# Patient Record
Sex: Female | Born: 1945 | Race: Asian | Hispanic: No | Smoking: Never smoker
Health system: Southern US, Community
[De-identification: ages and names within clinical notes are randomized; demographics above are authoritative.]

## PROBLEM LIST (undated history)

## (undated) DIAGNOSIS — E7800 Pure hypercholesterolemia, unspecified: Secondary | ICD-10-CM

## (undated) DIAGNOSIS — Z992 Dependence on renal dialysis: Secondary | ICD-10-CM

## (undated) DIAGNOSIS — I10 Essential (primary) hypertension: Secondary | ICD-10-CM

## (undated) DIAGNOSIS — H269 Unspecified cataract: Secondary | ICD-10-CM

## (undated) DIAGNOSIS — N189 Chronic kidney disease, unspecified: Secondary | ICD-10-CM

## (undated) DIAGNOSIS — K219 Gastro-esophageal reflux disease without esophagitis: Secondary | ICD-10-CM

## (undated) DIAGNOSIS — E785 Hyperlipidemia, unspecified: Secondary | ICD-10-CM

## (undated) DIAGNOSIS — K2990 Gastroduodenitis, unspecified, without bleeding: Secondary | ICD-10-CM

## (undated) DIAGNOSIS — K2970 Gastritis, unspecified, without bleeding: Secondary | ICD-10-CM

## (undated) DIAGNOSIS — R51 Headache: Secondary | ICD-10-CM

## (undated) DIAGNOSIS — K635 Polyp of colon: Secondary | ICD-10-CM

## (undated) DIAGNOSIS — Z86010 Personal history of colonic polyps: Principal | ICD-10-CM

## (undated) DIAGNOSIS — Z5189 Encounter for other specified aftercare: Secondary | ICD-10-CM

## (undated) DIAGNOSIS — N186 End stage renal disease: Secondary | ICD-10-CM

## (undated) DIAGNOSIS — D649 Anemia, unspecified: Secondary | ICD-10-CM

## (undated) DIAGNOSIS — M1990 Unspecified osteoarthritis, unspecified site: Secondary | ICD-10-CM

## (undated) HISTORY — DX: Chronic kidney disease, unspecified: N18.9

## (undated) HISTORY — DX: Unspecified cataract: H26.9

## (undated) HISTORY — PX: AV FISTULA REPAIR: SHX563

## (undated) HISTORY — DX: Gastritis, unspecified, without bleeding: K29.70

## (undated) HISTORY — DX: Personal history of colonic polyps: Z86.010

## (undated) HISTORY — DX: Essential (primary) hypertension: I10

## (undated) HISTORY — DX: Polyp of colon: K63.5

## (undated) HISTORY — DX: Anemia, unspecified: D64.9

## (undated) HISTORY — DX: Encounter for other specified aftercare: Z51.89

## (undated) HISTORY — DX: Gastroduodenitis, unspecified, without bleeding: K29.90

## (undated) HISTORY — DX: Hyperlipidemia, unspecified: E78.5

---

## 1978-05-22 HISTORY — PX: TUBAL LIGATION: SHX77

## 1997-11-18 ENCOUNTER — Other Ambulatory Visit: Admission: RE | Admit: 1997-11-18 | Discharge: 1997-11-18 | Payer: Self-pay | Admitting: Nephrology

## 1998-11-14 ENCOUNTER — Emergency Department (HOSPITAL_COMMUNITY): Admission: EM | Admit: 1998-11-14 | Discharge: 1998-11-14 | Payer: Self-pay | Admitting: Emergency Medicine

## 1998-11-14 ENCOUNTER — Encounter: Payer: Self-pay | Admitting: Emergency Medicine

## 2003-01-29 ENCOUNTER — Other Ambulatory Visit: Admission: RE | Admit: 2003-01-29 | Discharge: 2003-01-29 | Payer: Self-pay | Admitting: Nephrology

## 2003-01-29 ENCOUNTER — Other Ambulatory Visit: Admission: RE | Admit: 2003-01-29 | Discharge: 2003-01-29 | Payer: Self-pay | Admitting: Cardiology

## 2006-03-15 ENCOUNTER — Other Ambulatory Visit: Admission: RE | Admit: 2006-03-15 | Discharge: 2006-03-15 | Payer: Self-pay | Admitting: Nephrology

## 2007-11-07 ENCOUNTER — Emergency Department (HOSPITAL_COMMUNITY): Admission: EM | Admit: 2007-11-07 | Discharge: 2007-11-07 | Payer: Self-pay | Admitting: Family Medicine

## 2007-12-12 ENCOUNTER — Emergency Department (HOSPITAL_COMMUNITY): Admission: EM | Admit: 2007-12-12 | Discharge: 2007-12-12 | Payer: Self-pay | Admitting: Family Medicine

## 2008-01-10 ENCOUNTER — Other Ambulatory Visit: Admission: RE | Admit: 2008-01-10 | Discharge: 2008-01-10 | Payer: Self-pay | Admitting: Nephrology

## 2009-11-10 ENCOUNTER — Emergency Department (HOSPITAL_COMMUNITY): Admission: EM | Admit: 2009-11-10 | Discharge: 2009-11-10 | Payer: Self-pay | Admitting: Family Medicine

## 2010-03-13 ENCOUNTER — Emergency Department (HOSPITAL_COMMUNITY)
Admission: EM | Admit: 2010-03-13 | Discharge: 2010-03-13 | Payer: Self-pay | Source: Home / Self Care | Admitting: Family Medicine

## 2010-06-12 ENCOUNTER — Encounter: Payer: Self-pay | Admitting: Nephrology

## 2011-02-16 LAB — CBC
HCT: 30.3 — ABNORMAL LOW
Hemoglobin: 10 — ABNORMAL LOW
MCHC: 33
MCV: 82.9
Platelets: 196
RBC: 3.65 — ABNORMAL LOW
RDW: 13.5
WBC: 10.2

## 2011-02-16 LAB — RAPID URINE DRUG SCREEN, HOSP PERFORMED
Amphetamines: NOT DETECTED
Barbiturates: NOT DETECTED
Benzodiazepines: NOT DETECTED
Cocaine: NOT DETECTED
Opiates: NOT DETECTED
Tetrahydrocannabinol: NOT DETECTED

## 2011-02-16 LAB — DIFFERENTIAL
Basophils Absolute: 0.1
Basophils Relative: 1
Eosinophils Absolute: 0.2
Eosinophils Relative: 2
Lymphocytes Relative: 14
Lymphs Abs: 1.4
Monocytes Absolute: 0.5
Monocytes Relative: 5
Neutro Abs: 8.1 — ABNORMAL HIGH
Neutrophils Relative %: 79 — ABNORMAL HIGH

## 2011-02-16 LAB — URINALYSIS, ROUTINE W REFLEX MICROSCOPIC
Bilirubin Urine: NEGATIVE
Glucose, UA: NEGATIVE
Hgb urine dipstick: NEGATIVE
Ketones, ur: NEGATIVE
Nitrite: NEGATIVE
Protein, ur: 100 — AB
Specific Gravity, Urine: 1.007
Urobilinogen, UA: 0.2
pH: 5.5

## 2011-02-16 LAB — POCT I-STAT, CHEM 8
BUN: 53 — ABNORMAL HIGH
Calcium, Ion: 1.18
Chloride: 109
Creatinine, Ser: 2.4 — ABNORMAL HIGH
Glucose, Bld: 119 — ABNORMAL HIGH
HCT: 32 — ABNORMAL LOW
Hemoglobin: 10.9 — ABNORMAL LOW
Potassium: 4.9
Sodium: 139
TCO2: 21

## 2011-02-16 LAB — URINE MICROSCOPIC-ADD ON

## 2011-02-17 LAB — DIFFERENTIAL
Basophils Absolute: 0.1
Basophils Relative: 1
Eosinophils Absolute: 0.2
Eosinophils Relative: 2
Lymphocytes Relative: 23
Lymphs Abs: 2
Monocytes Absolute: 0.5
Monocytes Relative: 6
Neutro Abs: 6.1
Neutrophils Relative %: 68

## 2011-02-17 LAB — COMPREHENSIVE METABOLIC PANEL
ALT: 17
AST: 26
Albumin: 3.8
Alkaline Phosphatase: 82
BUN: 59 — ABNORMAL HIGH
CO2: 23
Calcium: 10.2
Chloride: 110
Creatinine, Ser: 2.4 — ABNORMAL HIGH
GFR calc Af Amer: 25 — ABNORMAL LOW
GFR calc non Af Amer: 20 — ABNORMAL LOW
Glucose, Bld: 98
Potassium: 4.4
Sodium: 138
Total Bilirubin: 0.6
Total Protein: 8

## 2011-02-17 LAB — CBC
HCT: 31.1 — ABNORMAL LOW
Hemoglobin: 10.3 — ABNORMAL LOW
MCHC: 33.2
MCV: 82.9
Platelets: 185
RBC: 3.75 — ABNORMAL LOW
RDW: 13.8
WBC: 8.9

## 2011-02-17 LAB — POCT CARDIAC MARKERS
CKMB, poc: 2.4
CKMB, poc: 2.5
Myoglobin, poc: 249
Myoglobin, poc: 269
Operator id: 294341
Operator id: 294341
Troponin i, poc: <0.05
Troponin i, poc: <0.05

## 2011-02-17 LAB — LIPASE, BLOOD: Lipase: 45

## 2011-02-17 LAB — AMYLASE: Amylase: 296 — ABNORMAL HIGH

## 2011-08-11 ENCOUNTER — Other Ambulatory Visit: Payer: Self-pay

## 2011-08-11 DIAGNOSIS — Z01810 Encounter for preprocedural cardiovascular examination: Secondary | ICD-10-CM

## 2011-08-11 DIAGNOSIS — N184 Chronic kidney disease, stage 4 (severe): Secondary | ICD-10-CM

## 2011-09-05 ENCOUNTER — Encounter: Payer: Self-pay | Admitting: Surgery

## 2011-09-15 ENCOUNTER — Encounter: Payer: Self-pay | Admitting: Surgery

## 2011-09-18 ENCOUNTER — Ambulatory Visit: Payer: Self-pay | Admitting: Surgery

## 2011-10-09 ENCOUNTER — Other Ambulatory Visit (HOSPITAL_COMMUNITY): Payer: Self-pay | Admitting: *Deleted

## 2011-10-12 ENCOUNTER — Encounter (HOSPITAL_COMMUNITY): Payer: Self-pay

## 2011-10-20 ENCOUNTER — Ambulatory Visit (HOSPITAL_COMMUNITY)
Admission: RE | Admit: 2011-10-20 | Discharge: 2011-10-20 | Disposition: A | Discharge status: Home / Self Care | Payer: PRIVATE HEALTH INSURANCE | Source: Ambulatory Visit | Attending: Nephrology | Admitting: Nephrology

## 2011-10-20 DIAGNOSIS — D649 Anemia, unspecified: Secondary | ICD-10-CM | POA: Insufficient documentation

## 2011-10-20 LAB — POCT HEMOGLOBIN-HEMACUE: Hemoglobin: 9.7 g/dL — ABNORMAL LOW (ref 12.0–15.0)

## 2011-10-20 MED ORDER — EPOETIN ALFA 20000 UNIT/ML IJ SOLN
INTRAMUSCULAR | Status: AC
  Filled 2011-10-20: qty 1

## 2011-10-20 MED ORDER — EPOETIN ALFA 20000 UNIT/ML IJ SOLN
20000.0000 [IU] | INTRAMUSCULAR | Status: DC
  Administered 2011-10-20: 20000 [IU] via SUBCUTANEOUS

## 2011-11-03 ENCOUNTER — Encounter (HOSPITAL_COMMUNITY)
Admission: RE | Admit: 2011-11-03 | Discharge: 2011-11-03 | Disposition: A | Discharge status: Home / Self Care | Payer: PRIVATE HEALTH INSURANCE | Source: Ambulatory Visit | Attending: Nephrology | Admitting: Nephrology

## 2011-11-03 DIAGNOSIS — N185 Chronic kidney disease, stage 5: Secondary | ICD-10-CM | POA: Insufficient documentation

## 2011-11-03 DIAGNOSIS — D649 Anemia, unspecified: Secondary | ICD-10-CM | POA: Insufficient documentation

## 2011-11-03 LAB — POCT HEMOGLOBIN-HEMACUE: Hemoglobin: 10.4 g/dL — ABNORMAL LOW (ref 12.0–15.0)

## 2011-11-03 MED ORDER — EPOETIN ALFA 20000 UNIT/ML IJ SOLN
INTRAMUSCULAR | Status: AC
  Filled 2011-11-03: qty 1

## 2011-11-03 MED ORDER — EPOETIN ALFA 20000 UNIT/ML IJ SOLN
20000.0000 [IU] | INTRAMUSCULAR | Status: DC
  Administered 2011-11-03: 20000 [IU] via SUBCUTANEOUS

## 2011-11-17 ENCOUNTER — Encounter (HOSPITAL_COMMUNITY)
Admission: RE | Admit: 2011-11-17 | Discharge: 2011-11-17 | Disposition: A | Discharge status: Home / Self Care | Payer: PRIVATE HEALTH INSURANCE | Source: Ambulatory Visit | Attending: Nephrology | Admitting: Nephrology

## 2011-11-17 LAB — POCT HEMOGLOBIN-HEMACUE: Hemoglobin: 10.1 g/dL — ABNORMAL LOW (ref 12.0–15.0)

## 2011-11-17 MED ORDER — EPOETIN ALFA 20000 UNIT/ML IJ SOLN
20000.0000 [IU] | INTRAMUSCULAR | Status: DC
  Administered 2011-11-17: 20000 [IU] via SUBCUTANEOUS

## 2011-11-17 MED ORDER — EPOETIN ALFA 20000 UNIT/ML IJ SOLN
INTRAMUSCULAR | Status: AC
  Administered 2011-11-17: 20000 [IU] via SUBCUTANEOUS
  Filled 2011-11-17: qty 1

## 2011-12-01 ENCOUNTER — Encounter (HOSPITAL_COMMUNITY)
Admission: RE | Admit: 2011-12-01 | Discharge: 2011-12-01 | Disposition: A | Discharge status: Home / Self Care | Payer: PRIVATE HEALTH INSURANCE | Source: Ambulatory Visit | Attending: Nephrology | Admitting: Nephrology

## 2011-12-01 DIAGNOSIS — D649 Anemia, unspecified: Secondary | ICD-10-CM | POA: Insufficient documentation

## 2011-12-01 DIAGNOSIS — N185 Chronic kidney disease, stage 5: Secondary | ICD-10-CM | POA: Insufficient documentation

## 2011-12-01 LAB — POCT HEMOGLOBIN-HEMACUE: Hemoglobin: 10 g/dL — ABNORMAL LOW (ref 12.0–15.0)

## 2011-12-01 MED ORDER — EPOETIN ALFA 20000 UNIT/ML IJ SOLN
20000.0000 [IU] | INTRAMUSCULAR | Status: DC
  Administered 2011-12-01: 20000 [IU] via SUBCUTANEOUS
  Filled 2011-12-01: qty 1

## 2011-12-09 ENCOUNTER — Other Ambulatory Visit: Payer: Self-pay | Admitting: Nephrology

## 2011-12-14 ENCOUNTER — Encounter (HOSPITAL_COMMUNITY)
Admission: RE | Admit: 2011-12-14 | Discharge: 2011-12-14 | Disposition: A | Discharge status: Home / Self Care | Payer: PRIVATE HEALTH INSURANCE | Source: Ambulatory Visit | Attending: Nephrology | Admitting: Nephrology

## 2011-12-14 LAB — POCT HEMOGLOBIN-HEMACUE: Hemoglobin: 10.7 g/dL — ABNORMAL LOW (ref 12.0–15.0)

## 2011-12-14 LAB — RENAL FUNCTION PANEL
Albumin: 3 g/dL — ABNORMAL LOW (ref 3.5–5.2)
BUN: 112 mg/dL — ABNORMAL HIGH (ref 6–23)
CO2: 16 mEq/L — ABNORMAL LOW (ref 19–32)
Calcium: 8.6 mg/dL (ref 8.4–10.5)
Chloride: 100 mEq/L (ref 96–112)
Creatinine, Ser: 10.09 mg/dL — ABNORMAL HIGH (ref 0.50–1.10)
GFR calc Af Amer: 4 mL/min — ABNORMAL LOW (ref >90)
GFR calc non Af Amer: 3 mL/min — ABNORMAL LOW (ref >90)
Glucose, Bld: 80 mg/dL (ref 70–99)
Phosphorus: 8.9 mg/dL — ABNORMAL HIGH (ref 2.3–4.6)
Potassium: 6.5 mEq/L (ref 3.5–5.1)
Sodium: 131 mEq/L — ABNORMAL LOW (ref 135–145)

## 2011-12-14 LAB — IRON AND TIBC
Iron: 74 ug/dL (ref 42–135)
Saturation Ratios: 28 % (ref 20–55)
TIBC: 266 ug/dL (ref 250–470)
UIBC: 192 ug/dL (ref 125–400)

## 2011-12-14 LAB — FERRITIN: Ferritin: 38 ng/mL (ref 10–291)

## 2011-12-14 MED ORDER — EPOETIN ALFA 20000 UNIT/ML IJ SOLN
INTRAMUSCULAR | Status: AC
  Filled 2011-12-14: qty 1

## 2011-12-14 MED ORDER — EPOETIN ALFA 20000 UNIT/ML IJ SOLN
20000.0000 [IU] | INTRAMUSCULAR | Status: DC
  Administered 2011-12-14: 20000 [IU] via SUBCUTANEOUS

## 2011-12-15 LAB — PTH, INTACT AND CALCIUM
Calcium, Total (PTH): 8.3 mg/dL — ABNORMAL LOW (ref 8.4–10.5)
PTH: 718.7 pg/mL — ABNORMAL HIGH (ref 14.0–72.0)

## 2011-12-28 ENCOUNTER — Encounter (HOSPITAL_COMMUNITY)
Admission: RE | Admit: 2011-12-28 | Discharge: 2011-12-28 | Disposition: A | Discharge status: Home / Self Care | Payer: PRIVATE HEALTH INSURANCE | Source: Ambulatory Visit | Attending: Nephrology | Admitting: Nephrology

## 2011-12-28 DIAGNOSIS — D649 Anemia, unspecified: Secondary | ICD-10-CM | POA: Insufficient documentation

## 2011-12-28 DIAGNOSIS — N185 Chronic kidney disease, stage 5: Secondary | ICD-10-CM | POA: Insufficient documentation

## 2011-12-28 LAB — POCT HEMOGLOBIN-HEMACUE: Hemoglobin: 10.5 g/dL — ABNORMAL LOW (ref 12.0–15.0)

## 2011-12-28 MED ORDER — EPOETIN ALFA 20000 UNIT/ML IJ SOLN
20000.0000 [IU] | INTRAMUSCULAR | Status: DC
  Administered 2011-12-28: 20000 [IU] via SUBCUTANEOUS
  Filled 2011-12-28: qty 1

## 2012-01-07 ENCOUNTER — Other Ambulatory Visit: Payer: Self-pay | Admitting: Nephrology

## 2012-01-11 ENCOUNTER — Encounter (HOSPITAL_COMMUNITY)
Admission: RE | Admit: 2012-01-11 | Discharge: 2012-01-11 | Disposition: A | Discharge status: Home / Self Care | Payer: PRIVATE HEALTH INSURANCE | Source: Ambulatory Visit | Attending: Nephrology | Admitting: Nephrology

## 2012-01-11 LAB — POCT HEMOGLOBIN-HEMACUE: Hemoglobin: 11.1 g/dL — ABNORMAL LOW (ref 12.0–15.0)

## 2012-01-11 MED ORDER — EPOETIN ALFA 20000 UNIT/ML IJ SOLN
20000.0000 [IU] | INTRAMUSCULAR | Status: DC
  Administered 2012-01-11: 20000 [IU] via SUBCUTANEOUS
  Filled 2012-01-11: qty 1

## 2012-01-19 ENCOUNTER — Other Ambulatory Visit: Payer: Self-pay

## 2012-01-19 DIAGNOSIS — Z01810 Encounter for preprocedural cardiovascular examination: Secondary | ICD-10-CM

## 2012-01-19 DIAGNOSIS — N185 Chronic kidney disease, stage 5: Secondary | ICD-10-CM

## 2012-01-23 ENCOUNTER — Encounter: Payer: Self-pay | Admitting: Vascular Surgery

## 2012-01-24 ENCOUNTER — Other Ambulatory Visit (HOSPITAL_COMMUNITY): Payer: Self-pay

## 2012-01-24 ENCOUNTER — Encounter: Payer: Self-pay | Admitting: Vascular Surgery

## 2012-01-24 ENCOUNTER — Encounter (INDEPENDENT_AMBULATORY_CARE_PROVIDER_SITE_OTHER): Payer: PRIVATE HEALTH INSURANCE | Admitting: *Deleted

## 2012-01-24 ENCOUNTER — Ambulatory Visit (INDEPENDENT_AMBULATORY_CARE_PROVIDER_SITE_OTHER): Payer: PRIVATE HEALTH INSURANCE | Admitting: Vascular Surgery

## 2012-01-24 VITALS — BP 191/86 | HR 58 | Resp 16 | Ht 62.0 in | Wt 173.0 lb

## 2012-01-24 DIAGNOSIS — N185 Chronic kidney disease, stage 5: Secondary | ICD-10-CM

## 2012-01-24 DIAGNOSIS — N186 End stage renal disease: Secondary | ICD-10-CM | POA: Insufficient documentation

## 2012-01-24 DIAGNOSIS — Z992 Dependence on renal dialysis: Secondary | ICD-10-CM | POA: Insufficient documentation

## 2012-01-24 DIAGNOSIS — Z01810 Encounter for preprocedural cardiovascular examination: Secondary | ICD-10-CM

## 2012-01-24 NOTE — Progress Notes (Signed)
Vascular and Vein Specialist of [facility]  Patient name: Alexa Dougherty MRN: [id number] DOB: 03-12-1946 Sex: female  REASON FOR CONSULT: Evaluate for hemodialysis access. She was referred by Dr. Erling Cruz.  HPI: Alexa Dougherty is a 66 y.o. female who is not yet on dialysis. We were asked to place access and also placing a catheter. 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 She is right-handed. She denies any recent uremic symptoms. Specifically she denies nausea, vomiting, fatigue, anorexia, or palpitations.  I have reviewed her records from Dr. Ollen Gross Powell's office. She also has anemia and has been receiving Procrit. In addition she has uncontrolled hypertension. She has secondary hyperparathyroidism. Her last creatinine was 11.87.  Past Medical History  Diagnosis Date  . Anemia   . Hypertension   . Chronic kidney disease     Family History  Problem Relation Age of Onset  . Hypertension Mother   . Cancer Sister   . Cancer Brother   . Hypertension Daughter     SOCIAL HISTORY: History  Substance Use Topics  . Smoking status: Never Smoker   . Smokeless tobacco: Never Used  . Alcohol Use: No    No Known Allergies  Current Outpatient Prescriptions  Medication Sig Dispense Refill  . atorvastatin (LIPITOR) 10 MG tablet Take 10 mg by mouth daily.      Marland Kitchen dexlansoprazole (DEXILANT) 60 MG capsule Take 60 mg by mouth daily.      . diazepam (VALIUM) 2 MG tablet Take 2 mg by mouth as needed.      . doxazosin (CARDURA) 1 MG tablet Take 1 mg by mouth daily. Take 1 tab at bedtime for 2 weeks starting 01/17/2012, then take 2 q day after      . epoetin alfa (EPOGEN,PROCRIT) 16109 UNIT/ML injection Inject 20,000 Units into the skin 2 (two) times a week.      . iron polysaccharides (NIFEREX) 150 MG capsule Take 150 mg by mouth daily.      . metoprolol succinate (TOPROL-XL) 50 MG 24 hr tablet Take 50 mg by mouth daily.      Marland Kitchen RENVELA 800 MG  tablet Take 800 mg by mouth daily.      Marland Kitchen amLODipine (NORVASC) 5 MG tablet Take 5 mg by mouth daily.      . calcium-vitamin D (OSCAL WITH D) 500-200 MG-UNIT per tablet Take 1 tablet by mouth 2 (two) times daily.      Marland Kitchen lisinopril-hydrochlorothiazide (PRINZIDE,ZESTORETIC) 10-12.5 MG per tablet Take 1 tablet by mouth daily.        REVIEW OF SYSTEMS: Valu.Nieves ] denotes positive finding; [  ] denotes negative finding  CARDIOVASCULAR:  [ ]  chest pain   [ ]  chest pressure   [ ]  palpitations   [ ]  orthopnea   [ ]  dyspnea on exertion   [ ]  claudication   [ ]  rest pain   [ ]  DVT   [ ]  phlebitis PULMONARY:   [ ]  productive cough   [ ]  asthma   [ ]  wheezing NEUROLOGIC:   [ ]  weakness  [ ]  paresthesias  [ ]  aphasia  [ ]  amaurosis  [ ]  dizziness HEMATOLOGIC:   [ ]  bleeding problems   [ ]  clotting disorders MUSCULOSKELETAL:  [ ]  joint pain   [ ]  joint swelling [ ]  leg swelling GASTROINTESTINAL: [ ]   blood in stool  [ ]   hematemesis GENITOURINARY:  [ ]   dysuria  [ ]   hematuria PSYCHIATRIC:  [ ]   history of major depression INTEGUMENTARY:  [ ]  rashes  [ ]  ulcers CONSTITUTIONAL:  [ ]  fever   [ ]  chills  PHYSICAL EXAM: Filed Vitals:   01/24/12 1555  BP: 191/86  Pulse: 58  Resp: 16  Height: 5\' 2"  (1.575 m)  Weight: 173 lb (78.472 kg)  SpO2: 100%   Body mass index is 31.64 kg/(m^2). GENERAL: The patient is a well-nourished female, in no acute distress. The vital signs are documented above. CARDIOVASCULAR: There is a regular rate and rhythm. I do not detect carotid bruits. She has palpable brachial and radial pulses bilaterally. PULMONARY: There is good air exchange bilaterally without wheezing or rales. ABDOMEN: Soft and non-tender with normal pitched bowel sounds.  MUSCULOSKELETAL: There are no major deformities or cyanosis. NEUROLOGIC: No focal weakness or paresthesias are detected. SKIN: There are no ulcers or rashes noted. PSYCHIATRIC: The patient has a normal affect.  DATA:  I have independently  interpreted her vein map which shows a reasonable forearm and upper arm cephalic vein on the left. She also has a reasonable basilic vein in the upper arm on the left. The veins on the right are somewhat smaller but also looked reasonable.  MEDICAL ISSUES:  End stage renal disease Patient is scheduled for placement of a dietetic catheter and a left AV fistula on 02/02/2012. I have discussed the indications for placement of the catheter and the potential complications including but not limited to bleeding, arterial injury, or pneumothorax. We have also discussed the risk of catheter infection.I have also explained the indications for placement of an AV fistula or AV graft. I've explained that if at all possible we will place an AV fistula.  I have reviewed the risks of placement of an AV fistula including but not limited to: failure of the fistula to mature, need for subsequent interventions, and thrombosis. In addition I have reviewed the potential complications of placement of an AV graft. These risks include, but are not limited to, graft thrombosis, graft infection, wound healing problems, bleeding, arm swelling, and steal syndrome. All the patient's questions were answered and they are agreeable to proceed with surgery.    [place] [facility] [place] Beeper: [phone]

## 2012-01-24 NOTE — Assessment & Plan Note (Signed)
Patient is scheduled for placement of a dietetic catheter and a left AV fistula on 02/02/2012. I have discussed the indications for placement of the catheter and the potential complications including but not limited to bleeding, arterial injury, or pneumothorax. We have also discussed the risk of catheter infection.I have also explained the indications for placement of an AV fistula or AV graft. I've explained that if at all possible we will place an AV fistula.  I have reviewed the risks of placement of an AV fistula including but not limited to: failure of the fistula to mature, need for subsequent interventions, and thrombosis. In addition I have reviewed the potential complications of placement of an AV graft. These risks include, but are not limited to, graft thrombosis, graft infection, wound healing problems, bleeding, arm swelling, and steal syndrome. All the patient's questions were answered and they are agreeable to proceed with surgery.

## 2012-01-25 ENCOUNTER — Encounter (HOSPITAL_COMMUNITY)
Admission: RE | Admit: 2012-01-25 | Discharge: 2012-01-25 | Disposition: A | Discharge status: Home / Self Care | Payer: PRIVATE HEALTH INSURANCE | Source: Ambulatory Visit | Attending: Nephrology | Admitting: Nephrology

## 2012-01-25 DIAGNOSIS — N185 Chronic kidney disease, stage 5: Secondary | ICD-10-CM | POA: Insufficient documentation

## 2012-01-25 DIAGNOSIS — D649 Anemia, unspecified: Secondary | ICD-10-CM | POA: Insufficient documentation

## 2012-01-25 LAB — POCT HEMOGLOBIN-HEMACUE: Hemoglobin: 10.5 g/dL — ABNORMAL LOW (ref 12.0–15.0)

## 2012-01-25 MED ORDER — EPOETIN ALFA 20000 UNIT/ML IJ SOLN
INTRAMUSCULAR | Status: AC
  Filled 2012-01-25: qty 1

## 2012-01-25 MED ORDER — EPOETIN ALFA 20000 UNIT/ML IJ SOLN
20000.0000 [IU] | INTRAMUSCULAR | Status: DC
  Administered 2012-01-25: 20000 [IU] via SUBCUTANEOUS

## 2012-01-26 ENCOUNTER — Other Ambulatory Visit: Payer: Self-pay

## 2012-01-30 ENCOUNTER — Encounter (HOSPITAL_COMMUNITY): Payer: Self-pay | Admitting: Pharmacy Technician

## 2012-01-31 ENCOUNTER — Other Ambulatory Visit (HOSPITAL_COMMUNITY): Payer: PRIVATE HEALTH INSURANCE

## 2012-02-01 ENCOUNTER — Encounter (HOSPITAL_COMMUNITY): Payer: Self-pay

## 2012-02-01 ENCOUNTER — Encounter (HOSPITAL_COMMUNITY)
Admission: RE | Admit: 2012-02-01 | Discharge: 2012-02-01 | Disposition: A | Discharge status: Home / Self Care | Payer: PRIVATE HEALTH INSURANCE | Source: Ambulatory Visit | Attending: Vascular Surgery | Admitting: Vascular Surgery

## 2012-02-01 HISTORY — DX: Headache: R51

## 2012-02-01 HISTORY — DX: Unspecified osteoarthritis, unspecified site: M19.90

## 2012-02-01 HISTORY — DX: Gastro-esophageal reflux disease without esophagitis: K21.9

## 2012-02-01 LAB — SURGICAL PCR SCREEN
MRSA, PCR: NEGATIVE
Staphylococcus aureus: NEGATIVE

## 2012-02-01 LAB — CBC
HCT: 31 % — ABNORMAL LOW (ref 36.0–46.0)
Hemoglobin: 9.4 g/dL — ABNORMAL LOW (ref 12.0–15.0)
MCH: 24.6 pg — ABNORMAL LOW (ref 26.0–34.0)
MCHC: 30.3 g/dL (ref 30.0–36.0)
MCV: 81.2 fL (ref 78.0–100.0)
Platelets: 217 10*3/uL (ref 150–400)
RBC: 3.82 MIL/uL — ABNORMAL LOW (ref 3.87–5.11)
RDW: 16.7 % — ABNORMAL HIGH (ref 11.5–15.5)
WBC: 5.3 10*3/uL (ref 4.0–10.5)

## 2012-02-01 MED ORDER — CEFAZOLIN SODIUM-DEXTROSE 2-3 GM-% IV SOLR
2.0000 g | INTRAVENOUS | Status: AC
  Administered 2012-02-02: 2 g via INTRAVENOUS
  Filled 2012-02-01: qty 50

## 2012-02-01 NOTE — Pre-Procedure Instructions (Signed)
[place]  02/01/2012   Your procedure is scheduled on: Friday  02/02/12  Report to [facility] at 730 AM.  Call this number if you have problems the morning of surgery: [phone]   Remember:   Do not eat food OR DRINK :After Midnight.   Take these medicines the morning of surgery with A SIP OF WATER:  DEXILANT, VALIUM, CARDURA, TOPROL (METOPROLOL   Do not wear jewelry, make-up or nail polish.  Do not wear lotions, powders, or perfumes. You may wear deodorant.  Do not shave 48 hours prior to surgery. Men may shave face and neck.  Do not bring valuables to the hospital.  Contacts, dentures or bridgework may not be worn into surgery.  Leave suitcase in the car. After surgery it may be brought to your room.  For patients admitted to the hospital, checkout time is 11:00 AM the day of discharge.   Patients discharged the day of surgery will not be allowed to drive home.  Name and phone number of your driver:   Special Instructions: CHG Shower Use Special Wash: 1/2 bottle night before surgery and 1/2 bottle morning of surgery.   Please read over the following fact sheets that you were given: Pain Booklet, Coughing and Deep Breathing, MRSA Information and Surgical Site Infection Prevention

## 2012-02-01 NOTE — Progress Notes (Signed)
SPOKE TO JUDY AT DR DICKSON'S OFFICE RE; AREA ON PATIENT'S ABDOMEN THAT LOOKS RED AND RAISED (LOOKS LIKE A BITE/BOIL).  JUDY STATED PATIENT NEEDED TO SEE DR SHELTON TODAY AND DO CBC TO CHECK WBC.  PATIENT WILL SEE DR SHELTON'S PA TODAY WHEN SHE LEAVES PAT.

## 2012-02-02 ENCOUNTER — Ambulatory Visit (HOSPITAL_COMMUNITY): Payer: PRIVATE HEALTH INSURANCE

## 2012-02-02 ENCOUNTER — Ambulatory Visit (HOSPITAL_COMMUNITY)
Admission: RE | Admit: 2012-02-02 | Discharge: 2012-02-02 | Disposition: A | Discharge status: Home / Self Care | Payer: PRIVATE HEALTH INSURANCE | Source: Ambulatory Visit | Attending: Vascular Surgery | Admitting: Vascular Surgery

## 2012-02-02 ENCOUNTER — Ambulatory Visit (HOSPITAL_COMMUNITY): Payer: PRIVATE HEALTH INSURANCE | Admitting: Anesthesiology

## 2012-02-02 ENCOUNTER — Encounter (HOSPITAL_COMMUNITY): Payer: Self-pay | Admitting: Anesthesiology

## 2012-02-02 ENCOUNTER — Encounter (HOSPITAL_COMMUNITY): Payer: Self-pay | Admitting: *Deleted

## 2012-02-02 ENCOUNTER — Other Ambulatory Visit: Payer: Self-pay | Admitting: *Deleted

## 2012-02-02 ENCOUNTER — Encounter (HOSPITAL_COMMUNITY)
Admission: RE | Disposition: A | Discharge status: Home / Self Care | Payer: Self-pay | Source: Ambulatory Visit | Attending: Vascular Surgery

## 2012-02-02 ENCOUNTER — Telehealth: Payer: Self-pay | Admitting: Vascular Surgery

## 2012-02-02 DIAGNOSIS — K219 Gastro-esophageal reflux disease without esophagitis: Secondary | ICD-10-CM | POA: Insufficient documentation

## 2012-02-02 DIAGNOSIS — N185 Chronic kidney disease, stage 5: Secondary | ICD-10-CM | POA: Insufficient documentation

## 2012-02-02 DIAGNOSIS — R51 Headache: Secondary | ICD-10-CM | POA: Insufficient documentation

## 2012-02-02 DIAGNOSIS — Z01812 Encounter for preprocedural laboratory examination: Secondary | ICD-10-CM | POA: Insufficient documentation

## 2012-02-02 DIAGNOSIS — N186 End stage renal disease: Secondary | ICD-10-CM

## 2012-02-02 DIAGNOSIS — Z4931 Encounter for adequacy testing for hemodialysis: Secondary | ICD-10-CM

## 2012-02-02 DIAGNOSIS — Z01818 Encounter for other preprocedural examination: Secondary | ICD-10-CM | POA: Insufficient documentation

## 2012-02-02 DIAGNOSIS — I120 Hypertensive chronic kidney disease with stage 5 chronic kidney disease or end stage renal disease: Secondary | ICD-10-CM | POA: Insufficient documentation

## 2012-02-02 DIAGNOSIS — Z01810 Encounter for preprocedural cardiovascular examination: Secondary | ICD-10-CM | POA: Insufficient documentation

## 2012-02-02 HISTORY — PX: INSERTION OF DIALYSIS CATHETER: SHX1324

## 2012-02-02 HISTORY — PX: AV FISTULA PLACEMENT: SHX1204

## 2012-02-02 LAB — POCT I-STAT 4, (NA,K, GLUC, HGB,HCT)
Glucose, Bld: 95 mg/dL (ref 70–99)
HCT: 32 % — ABNORMAL LOW (ref 36.0–46.0)
Hemoglobin: 10.9 g/dL — ABNORMAL LOW (ref 12.0–15.0)
Potassium: 4.7 mEq/L (ref 3.5–5.1)
Sodium: 140 mEq/L (ref 135–145)

## 2012-02-02 SURGERY — ARTERIOVENOUS (AV) FISTULA CREATION
Anesthesia: General | Site: Neck | Laterality: Right | Wound class: Clean

## 2012-02-02 MED ORDER — LIDOCAINE-EPINEPHRINE (PF) 1 %-1:200000 IJ SOLN
INTRAMUSCULAR | Status: AC
  Filled 2012-02-02: qty 10

## 2012-02-02 MED ORDER — 0.9 % SODIUM CHLORIDE (POUR BTL) OPTIME
TOPICAL | Status: DC | PRN
  Administered 2012-02-02: 1000 mL

## 2012-02-02 MED ORDER — FENTANYL CITRATE 0.05 MG/ML IJ SOLN
INTRAMUSCULAR | Status: DC | PRN
  Administered 2012-02-02: 75 ug via INTRAVENOUS
  Administered 2012-02-02: 25 ug via INTRAVENOUS
  Administered 2012-02-02: 50 ug via INTRAVENOUS
  Administered 2012-02-02: 25 ug via INTRAVENOUS

## 2012-02-02 MED ORDER — SODIUM CHLORIDE 0.9 % IV SOLN: INTRAVENOUS | Status: DC

## 2012-02-02 MED ORDER — METOPROLOL SUCCINATE ER 50 MG PO TB24
50.0000 mg | ORAL_TABLET | Freq: Once | ORAL | Status: AC
  Administered 2012-02-02: 50 mg via ORAL
  Filled 2012-02-02: qty 1

## 2012-02-02 MED ORDER — HYDROMORPHONE HCL PF 1 MG/ML IJ SOLN
INTRAMUSCULAR | Status: AC
  Administered 2012-02-02: 0.25 mg via INTRAVENOUS
  Filled 2012-02-02: qty 1

## 2012-02-02 MED ORDER — PHENYLEPHRINE HCL 10 MG/ML IJ SOLN
INTRAMUSCULAR | Status: DC | PRN
  Administered 2012-02-02: 120 ug via INTRAVENOUS
  Administered 2012-02-02 (×2): 80 ug via INTRAVENOUS
  Administered 2012-02-02: 320 ug via INTRAVENOUS

## 2012-02-02 MED ORDER — OXYCODONE-ACETAMINOPHEN 5-325 MG PO TABS: 1.0000 | ORAL_TABLET | ORAL | Status: AC | PRN

## 2012-02-02 MED ORDER — PROPOFOL 10 MG/ML IV BOLUS
INTRAVENOUS | Status: DC | PRN
  Administered 2012-02-02: 160 mg via INTRAVENOUS

## 2012-02-02 MED ORDER — THROMBIN 20000 UNITS EX SOLR
CUTANEOUS | Status: AC
  Filled 2012-02-02: qty 20000

## 2012-02-02 MED ORDER — ONDANSETRON HCL 4 MG/2ML IJ SOLN
INTRAMUSCULAR | Status: DC | PRN
  Administered 2012-02-02: 4 mg via INTRAVENOUS

## 2012-02-02 MED ORDER — HYDROMORPHONE HCL PF 1 MG/ML IJ SOLN
0.2500 mg | INTRAMUSCULAR | Status: DC | PRN
  Administered 2012-02-02 (×2): 0.25 mg via INTRAVENOUS

## 2012-02-02 MED ORDER — LIDOCAINE HCL (CARDIAC) 20 MG/ML IV SOLN
INTRAVENOUS | Status: DC | PRN
  Administered 2012-02-02: 50 mg via INTRAVENOUS

## 2012-02-02 MED ORDER — SODIUM CHLORIDE 0.9 % IR SOLN
Status: DC | PRN
  Administered 2012-02-02: 11:00:00

## 2012-02-02 MED ORDER — ONDANSETRON HCL 4 MG/2ML IJ SOLN: 4.0000 mg | Freq: Once | INTRAMUSCULAR | Status: DC | PRN

## 2012-02-02 MED ORDER — EPHEDRINE SULFATE 50 MG/ML IJ SOLN
INTRAMUSCULAR | Status: DC | PRN
  Administered 2012-02-02: 10 mg via INTRAVENOUS
  Administered 2012-02-02: 15 mg via INTRAVENOUS
  Administered 2012-02-02 (×2): 10 mg via INTRAVENOUS

## 2012-02-02 MED ORDER — PROTAMINE SULFATE 10 MG/ML IV SOLN
INTRAVENOUS | Status: DC | PRN
  Administered 2012-02-02 (×3): 10 mg via INTRAVENOUS

## 2012-02-02 MED ORDER — HEPARIN SODIUM (PORCINE) 1000 UNIT/ML IJ SOLN
INTRAMUSCULAR | Status: AC
  Filled 2012-02-02: qty 1

## 2012-02-02 MED ORDER — MIDAZOLAM HCL 5 MG/5ML IJ SOLN
INTRAMUSCULAR | Status: DC | PRN
  Administered 2012-02-02: 1 mg via INTRAVENOUS

## 2012-02-02 MED ORDER — HEPARIN SODIUM (PORCINE) 1000 UNIT/ML IJ SOLN
INTRAMUSCULAR | Status: DC | PRN
  Administered 2012-02-02: 4.6 mL

## 2012-02-02 MED ORDER — SODIUM CHLORIDE 0.9 % IV SOLN
INTRAVENOUS | Status: DC | PRN
  Administered 2012-02-02 (×2): via INTRAVENOUS

## 2012-02-02 MED ORDER — HEPARIN SODIUM (PORCINE) 1000 UNIT/ML IJ SOLN
INTRAMUSCULAR | Status: DC | PRN
  Administered 2012-02-02: 6000 [IU] via INTRAVENOUS

## 2012-02-02 SURGICAL SUPPLY — 64 items
ADH SKN CLS APL DERMABOND .7 (GAUZE/BANDAGES/DRESSINGS) ×4
BAG DECANTER FOR FLEXI CONT (MISCELLANEOUS) ×2 IMPLANT
CANISTER SUCTION 2500CC (MISCELLANEOUS) ×3 IMPLANT
CATH CANNON HEMO 15F 50CM (CATHETERS) IMPLANT
CATH CANNON HEMO 15FR 19 (HEMODIALYSIS SUPPLIES) IMPLANT
CATH CANNON HEMO 15FR 23CM (HEMODIALYSIS SUPPLIES) ×1 IMPLANT
CATH CANNON HEMO 15FR 31CM (HEMODIALYSIS SUPPLIES) IMPLANT
CATH CANNON HEMO 15FR 32 (HEMODIALYSIS SUPPLIES) IMPLANT
CATH CANNON HEMO 15FR 32CM (HEMODIALYSIS SUPPLIES) IMPLANT
CHLORAPREP W/TINT 26ML (MISCELLANEOUS) ×3 IMPLANT
CLIP TI MEDIUM 6 (CLIP) ×3 IMPLANT
CLIP TI WIDE RED SMALL 6 (CLIP) ×3 IMPLANT
CLOTH BEACON ORANGE TIMEOUT ST (SAFETY) ×3 IMPLANT
COVER PROBE W GEL 5X96 (DRAPES) ×3 IMPLANT
COVER SURGICAL LIGHT HANDLE (MISCELLANEOUS) ×3 IMPLANT
DECANTER SPIKE VIAL GLASS SM (MISCELLANEOUS) ×2 IMPLANT
DERMABOND ADVANCED (GAUZE/BANDAGES/DRESSINGS) ×2
DERMABOND ADVANCED .7 DNX12 (GAUZE/BANDAGES/DRESSINGS) ×2 IMPLANT
DRAIN PENROSE 1/2X12 LTX STRL (WOUND CARE) IMPLANT
DRAPE C-ARM 42X72 X-RAY (DRAPES) ×3 IMPLANT
DRAPE CHEST BREAST 15X10 FENES (DRAPES) ×3 IMPLANT
ELECT REM PT RETURN 9FT ADLT (ELECTROSURGICAL) ×3
ELECTRODE REM PT RTRN 9FT ADLT (ELECTROSURGICAL) ×2 IMPLANT
GAUZE SPONGE 2X2 8PLY STRL LF (GAUZE/BANDAGES/DRESSINGS) ×2 IMPLANT
GAUZE SPONGE 4X4 16PLY XRAY LF (GAUZE/BANDAGES/DRESSINGS) ×3 IMPLANT
GLOVE BIO SURGEON STRL SZ7.5 (GLOVE) ×3 IMPLANT
GLOVE BIOGEL PI IND STRL 6.5 (GLOVE) IMPLANT
GLOVE BIOGEL PI IND STRL 7.5 (GLOVE) ×2 IMPLANT
GLOVE BIOGEL PI INDICATOR 6.5 (GLOVE) ×1
GLOVE BIOGEL PI INDICATOR 7.5 (GLOVE) ×3
GLOVE SS BIOGEL STRL SZ 7 (GLOVE) IMPLANT
GLOVE SUPERSENSE BIOGEL SZ 7 (GLOVE) ×1
GLOVE SURG SS PI 7.0 STRL IVOR (GLOVE) ×1 IMPLANT
GOWN PREVENTION PLUS XLARGE (GOWN DISPOSABLE) ×1 IMPLANT
GOWN STRL NON-REIN LRG LVL3 (GOWN DISPOSABLE) ×7 IMPLANT
KIT BASIN OR (CUSTOM PROCEDURE TRAY) ×3 IMPLANT
KIT ROOM TURNOVER OR (KITS) ×3 IMPLANT
NDL 18GX1X1/2 (RX/OR ONLY) (NEEDLE) ×2 IMPLANT
NDL HYPO 25GX1X1/2 BEV (NEEDLE) ×2 IMPLANT
NEEDLE 18GX1X1/2 (RX/OR ONLY) (NEEDLE) ×3 IMPLANT
NEEDLE 22X1 1/2 (OR ONLY) (NEEDLE) ×3 IMPLANT
NEEDLE HYPO 25GX1X1/2 BEV (NEEDLE) IMPLANT
NS IRRIG 1000ML POUR BTL (IV SOLUTION) ×3 IMPLANT
PACK CV ACCESS (CUSTOM PROCEDURE TRAY) ×3 IMPLANT
PACK SURGICAL SETUP 50X90 (CUSTOM PROCEDURE TRAY) ×2 IMPLANT
PAD ARMBOARD 7.5X6 YLW CONV (MISCELLANEOUS) ×6 IMPLANT
SPONGE GAUZE 2X2 STER 10/PKG (GAUZE/BANDAGES/DRESSINGS) ×1
SPONGE GAUZE 4X4 12PLY (GAUZE/BANDAGES/DRESSINGS) ×3 IMPLANT
SPONGE SURGIFOAM ABS GEL 100 (HEMOSTASIS) IMPLANT
SUT ETHILON 3 0 PS 1 (SUTURE) ×3 IMPLANT
SUT PROLENE 6 0 BV (SUTURE) ×3 IMPLANT
SUT VIC AB 3-0 SH 27 (SUTURE) ×3
SUT VIC AB 3-0 SH 27X BRD (SUTURE) ×2 IMPLANT
SUT VICRYL 4-0 PS2 18IN ABS (SUTURE) ×3 IMPLANT
SYR 20CC LL (SYRINGE) ×6 IMPLANT
SYR 30ML LL (SYRINGE) IMPLANT
SYR 5ML LL (SYRINGE) ×6 IMPLANT
SYR CONTROL 10ML LL (SYRINGE) ×2 IMPLANT
SYRINGE 10CC LL (SYRINGE) ×3 IMPLANT
TAPE CLOTH SURG 4X10 WHT LF (GAUZE/BANDAGES/DRESSINGS) ×1 IMPLANT
TOWEL OR 17X24 6PK STRL BLUE (TOWEL DISPOSABLE) ×3 IMPLANT
TOWEL OR 17X26 10 PK STRL BLUE (TOWEL DISPOSABLE) ×3 IMPLANT
UNDERPAD 30X30 INCONTINENT (UNDERPADS AND DIAPERS) ×3 IMPLANT
WATER STERILE IRR 1000ML POUR (IV SOLUTION) ×3 IMPLANT

## 2012-02-02 NOTE — Anesthesia Postprocedure Evaluation (Signed)
  Anesthesia Post-op Note  Patient: Alexa Dougherty  Procedure(s) Performed: Procedure(s) (LRB) with comments: ARTERIOVENOUS (AV) FISTULA CREATION (Left) INSERTION OF DIALYSIS CATHETER (Right) - Insertion of 23cm dialysis catheter in Right Internal Jugular   Patient Location: PACU  Anesthesia Type: General  Level of Consciousness: awake, oriented, sedated and patient cooperative  Airway and Oxygen Therapy: Patient Spontanous Breathing and Patient connected to nasal cannula oxygen  Post-op Pain: mild  Post-op Assessment: Post-op Vital signs reviewed, Patient's Cardiovascular Status Stable, Respiratory Function Stable, Patent Airway, No signs of Nausea or vomiting and Pain level controlled  Post-op Vital Signs: stable  Complications: No apparent anesthesia complications

## 2012-02-02 NOTE — Anesthesia Preprocedure Evaluation (Addendum)
Anesthesia Evaluation  Patient identified by MRN, date of birth, ID band Patient awake    Reviewed: Allergy & Precautions, H&P , NPO status , Patient's Chart, lab work & pertinent test results  Airway Mallampati: I TM Distance: >3 FB Neck ROM: full    Dental  (+) Dental Advidsory Given and Teeth Intact   Pulmonary          Cardiovascular hypertension, Rhythm:regular Rate:Normal     Neuro/Psych  Headaches,    GI/Hepatic GERD-  ,  Endo/Other    Renal/GU CRF and ESRFRenal disease     Musculoskeletal   Abdominal   Peds  Hematology   Anesthesia Other Findings   Reproductive/Obstetrics                          Anesthesia Physical Anesthesia Plan  ASA: III  Anesthesia Plan: General   Post-op Pain Management:    Induction: Intravenous  Airway Management Planned: LMA and Oral ETT  Additional Equipment:   Intra-op Plan:   Post-operative Plan: Extubation in OR  Informed Consent: I have reviewed the patients History and Physical, chart, labs and discussed the procedure including the risks, benefits and alternatives for the proposed anesthesia with the patient or authorized representative who has indicated his/her understanding and acceptance.   Dental Advisory Given  Plan Discussed with: CRNA, Anesthesiologist and Surgeon  Anesthesia Plan Comments:        Anesthesia Quick Evaluation

## 2012-02-02 NOTE — Op Note (Signed)
NAME: Alexa Dougherty   MRN: [id number] DOB: June 19, 1945    DATE OF OPERATION: 02/02/2012  PREOP DIAGNOSIS: Chronic kidney disease  POSTOP DIAGNOSIS: same  PROCEDURE:  1. Left brachiocephalic AV fistula 2. Ultrasound guided placement of right IJ 23 cm Diatek catheter   SURGEON: Judeth Cornfield. Scot Dock, MD, FACS  ASSIST: Gerri Lins PA  ANESTHESIA: Gen.   EBL: minimal  INDICATIONS: Alexa Dougherty is a 66 y.o. female who is to begin dialysis soon. We were asked to place a catheter and a fistula.  FINDINGS: the cephalic vein was 4 mm in the upper arm. Both internal jugular veins were patent.  TECHNIQUE: Patient was brought to the operating room and received a general anesthetic. The left upper extremity was prepped and draped in usual sterile fashion a transverse incision was made at the antecubital level and the brachial artery was dissected free beneath the fascia. It was soft and free of plaque. The cephalic vein was dissected free and ligated distally. It irrigated out with heparinized saline and was an approximately 4 mm vein. The patient was heparinized. The brachial artery was clamped proximally and distally and a longitudinal arteriotomy was made. The vein was mobilized over and sewn end-to-side to the artery using continuous 6-0 Prolene suture. At the completion was a good thrill in the fistula. Hemostasis was obtained in the wound and the heparin was partially reversed with protamine. The wounds closed the deep layer of 3-0 Vicryl and the skin closed with 4-0 Vicryl. Dermabond was applied.  Next attention was turned to placement of a right IJ Diatek catheter. The neck and upper chest were prepped and draped in the usual sterile fashion. Under ultrasound guidance, the right IJ was cannulated and a guidewire introduced into the superior vena cava under fluoroscopic control. The tract of the wire was dilated and the dilator and peel-away sheath were then advanced over the wire and the  wire and dilator were removed. The 23 cm catheter was passed through the peel-away sheath down into the right atrium. The exit site of the cath was selected and the skin anesthetized between the 2 areas. Catheter was then brought through the tunnel cut to the appropriate length and the distal ports were attached. Both ports withdrew easily with a flush with heparinized saline and filled with concentrated heparin. The catheter was secured at its exit site with a 3-0 nylon suture. The IJ cannulation site was closed with a 4-0 subcuticular stitch. Dressings applied. The patient tolerated the procedure well and was transferred to the recovery room in stable condition. All needle and sponge counts were correct.  [name], MD, FACS Vascular and Vein Specialists of [facility]  DATE OF DICTATION:   02/02/2012

## 2012-02-02 NOTE — OR Nursing (Addendum)
Fistula procedure [id number] Dialysis catheter procedure [id number]

## 2012-02-02 NOTE — Transfer of Care (Signed)
Immediate Anesthesia Transfer of Care Note  Patient: Alexa Dougherty  Procedure(s) Performed: Procedure(s) (LRB) with comments: ARTERIOVENOUS (AV) FISTULA CREATION (Left) INSERTION OF DIALYSIS CATHETER (Right) - Insertion of 23cm dialysis catheter in Right Internal Jugular   Patient Location: PACU  Anesthesia Type: General  Level of Consciousness: awake, alert  and oriented  Airway & Oxygen Therapy: Patient Spontanous Breathing and Patient connected to nasal cannula oxygen  Post-op Assessment: Report given to PACU RN and Post -op Vital signs reviewed and stable  Post vital signs: Reviewed and stable  Complications: No apparent anesthesia complications

## 2012-02-02 NOTE — Anesthesia Procedure Notes (Signed)
Procedure Name: LMA Insertion Date/Time: 02/02/2012 10:32 AM Performed by: Neldon Newport Pre-anesthesia Checklist: Patient identified, Timeout performed, Emergency Drugs available, Suction available and Patient being monitored Patient Re-evaluated:Patient Re-evaluated prior to inductionOxygen Delivery Method: Circle system utilized Preoxygenation: Pre-oxygenation with 100% oxygen Intubation Type: IV induction Ventilation: Mask ventilation without difficulty LMA: LMA inserted LMA Size: 4.0 Tube type: Oral Placement Confirmation: positive ETCO2 and breath sounds checked- equal and bilateral Tube secured with: Tape Dental Injury: Teeth and Oropharynx as per pre-operative assessment

## 2012-02-02 NOTE — Interval H&P Note (Signed)
History and Physical Interval Note:  02/02/2012 10:16 AM  Alexa Dougherty  has presented today for surgery, with the diagnosis of End Stage Renal Disease  The various methods of treatment have been discussed with the patient and family. After consideration of risks, benefits and other options for treatment, the patient has consented to  Procedure(s) (LRB) with comments: ARTERIOVENOUS (AV) FISTULA CREATION (Left) INSERTION OF DIALYSIS CATHETER (N/A) as a surgical intervention .  The patient's history has been reviewed, patient examined, no change in status, stable for surgery.  I have reviewed the patient's chart and labs.  Questions were answered to the patient's satisfaction.     DICKSON,CHRISTOPHER S

## 2012-02-02 NOTE — Progress Notes (Signed)
Spoke with Dr. Tamala Julian about patient's BP 168/111 and 181/100, patient did not have Toprol XL 50mg -ordered- will give as soon as it comes up.

## 2012-02-02 NOTE — Preoperative (Signed)
Beta Blockers   Reason not to administer Beta Blockers:Not Applicable 

## 2012-02-02 NOTE — Progress Notes (Signed)
All discharge instructions reviewed with patient and family member via interpreter. Patient verbalized understanding via interpreter. Vergie Living

## 2012-02-02 NOTE — Telephone Encounter (Addendum)
Message copied by Lujean Amel on Fri Feb 02, 2012  3:59 PM ------      Message from: Alfonso Patten      Created: Fri Feb 02, 2012  3:04 PM      Regarding: FW: charge and F/U                   ----- Message -----         From: Angelia Mould, MD         Sent: 02/02/2012  12:34 PM           To: Patrici Ranks, Alfonso Patten, RN      Subject: charge and F/U                                           PROCEDURE:       1. Left brachiocephalic AV fistula      2. Ultrasound guided placement of right IJ 23 cm Diatek catheter                  SURGEON: Judeth Cornfield. Scot Dock, MD, FACS            ASSIST: Gerri Lins PA            She will need a follow up visit in 6 weeks to check on the maturation of her fistula. She should have a duplex scan and she comes in for that visit. Thank you. CSD  I scheduled an appt for this pt on 03/13/12 at 10:30am. I left a msg on the pt's phone # and also mailed an appt letter. awt

## 2012-02-02 NOTE — H&P (View-Only) (Signed)
Vascular and Vein Specialist of [facility]  Patient name: Alexa Dougherty MRN: [id number] DOB: May 06, 1946 Sex: female  REASON FOR CONSULT: Evaluate for hemodialysis access. She was referred by Dr. Erling Cruz.  HPI: Alexa Dougherty is a 66 y.o. female who is not yet on dialysis. We were asked to place access and also placing a catheter. 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 She is right-handed. She denies any recent uremic symptoms. Specifically she denies nausea, vomiting, fatigue, anorexia, or palpitations.  I have reviewed her records from Dr. Ollen Gross Powell's office. She also has anemia and has been receiving Procrit. In addition she has uncontrolled hypertension. She has secondary hyperparathyroidism. Her last creatinine was 11.87.  Past Medical History  Diagnosis Date  . Anemia   . Hypertension   . Chronic kidney disease     Family History  Problem Relation Age of Onset  . Hypertension Mother   . Cancer Sister   . Cancer Brother   . Hypertension Daughter     SOCIAL HISTORY: History  Substance Use Topics  . Smoking status: Never Smoker   . Smokeless tobacco: Never Used  . Alcohol Use: No    No Known Allergies  Current Outpatient Prescriptions  Medication Sig Dispense Refill  . atorvastatin (LIPITOR) 10 MG tablet Take 10 mg by mouth daily.      Marland Kitchen dexlansoprazole (DEXILANT) 60 MG capsule Take 60 mg by mouth daily.      . diazepam (VALIUM) 2 MG tablet Take 2 mg by mouth as needed.      . doxazosin (CARDURA) 1 MG tablet Take 1 mg by mouth daily. Take 1 tab at bedtime for 2 weeks starting 01/17/2012, then take 2 q day after      . epoetin alfa (EPOGEN,PROCRIT) 91478 UNIT/ML injection Inject 20,000 Units into the skin 2 (two) times a week.      . iron polysaccharides (NIFEREX) 150 MG capsule Take 150 mg by mouth daily.      . metoprolol succinate (TOPROL-XL) 50 MG 24 hr tablet Take 50 mg by mouth daily.      Marland Kitchen RENVELA 800 MG  tablet Take 800 mg by mouth daily.      Marland Kitchen amLODipine (NORVASC) 5 MG tablet Take 5 mg by mouth daily.      . calcium-vitamin D (OSCAL WITH D) 500-200 MG-UNIT per tablet Take 1 tablet by mouth 2 (two) times daily.      Marland Kitchen lisinopril-hydrochlorothiazide (PRINZIDE,ZESTORETIC) 10-12.5 MG per tablet Take 1 tablet by mouth daily.        REVIEW OF SYSTEMS: Valu.Nieves ] denotes positive finding; [  ] denotes negative finding  CARDIOVASCULAR:  [ ]  chest pain   [ ]  chest pressure   [ ]  palpitations   [ ]  orthopnea   [ ]  dyspnea on exertion   [ ]  claudication   [ ]  rest pain   [ ]  DVT   [ ]  phlebitis PULMONARY:   [ ]  productive cough   [ ]  asthma   [ ]  wheezing NEUROLOGIC:   [ ]  weakness  [ ]  paresthesias  [ ]  aphasia  [ ]  amaurosis  [ ]  dizziness HEMATOLOGIC:   [ ]  bleeding problems   [ ]  clotting disorders MUSCULOSKELETAL:  [ ]  joint pain   [ ]  joint swelling [ ]  leg swelling GASTROINTESTINAL: [ ]   blood in stool  [ ]   hematemesis GENITOURINARY:  [ ]   dysuria  [ ]   hematuria PSYCHIATRIC:  [ ]   history of major depression INTEGUMENTARY:  [ ]  rashes  [ ]  ulcers CONSTITUTIONAL:  [ ]  fever   [ ]  chills  PHYSICAL EXAM: Filed Vitals:   01/24/12 1555  BP: 191/86  Pulse: 58  Resp: 16  Height: 5\' 2"  (1.575 m)  Weight: 173 lb (78.472 kg)  SpO2: 100%   Body mass index is 31.64 kg/(m^2). GENERAL: The patient is a well-nourished female, in no acute distress. The vital signs are documented above. CARDIOVASCULAR: There is a regular rate and rhythm. I do not detect carotid bruits. She has palpable brachial and radial pulses bilaterally. PULMONARY: There is good air exchange bilaterally without wheezing or rales. ABDOMEN: Soft and non-tender with normal pitched bowel sounds.  MUSCULOSKELETAL: There are no major deformities or cyanosis. NEUROLOGIC: No focal weakness or paresthesias are detected. SKIN: There are no ulcers or rashes noted. PSYCHIATRIC: The patient has a normal affect.  DATA:  I have independently  interpreted her vein map which shows a reasonable forearm and upper arm cephalic vein on the left. She also has a reasonable basilic vein in the upper arm on the left. The veins on the right are somewhat smaller but also looked reasonable.  MEDICAL ISSUES:  End stage renal disease Patient is scheduled for placement of a dietetic catheter and a left AV fistula on 02/02/2012. I have discussed the indications for placement of the catheter and the potential complications including but not limited to bleeding, arterial injury, or pneumothorax. We have also discussed the risk of catheter infection.I have also explained the indications for placement of an AV fistula or AV graft. I've explained that if at all possible we will place an AV fistula.  I have reviewed the risks of placement of an AV fistula including but not limited to: failure of the fistula to mature, need for subsequent interventions, and thrombosis. In addition I have reviewed the potential complications of placement of an AV graft. These risks include, but are not limited to, graft thrombosis, graft infection, wound healing problems, bleeding, arm swelling, and steal syndrome. All the patient's questions were answered and they are agreeable to proceed with surgery.    [place] [facility] [place] Beeper: [phone]

## 2012-02-05 ENCOUNTER — Encounter (HOSPITAL_COMMUNITY): Payer: Self-pay | Admitting: Vascular Surgery

## 2012-02-08 ENCOUNTER — Encounter (HOSPITAL_COMMUNITY): Payer: PRIVATE HEALTH INSURANCE

## 2012-03-12 ENCOUNTER — Encounter: Payer: Self-pay | Admitting: Neurosurgery

## 2012-03-13 ENCOUNTER — Ambulatory Visit (INDEPENDENT_AMBULATORY_CARE_PROVIDER_SITE_OTHER): Payer: PRIVATE HEALTH INSURANCE | Admitting: Vascular Surgery

## 2012-03-13 ENCOUNTER — Encounter (INDEPENDENT_AMBULATORY_CARE_PROVIDER_SITE_OTHER): Payer: PRIVATE HEALTH INSURANCE | Admitting: *Deleted

## 2012-03-13 ENCOUNTER — Encounter: Payer: Self-pay | Admitting: Vascular Surgery

## 2012-03-13 VITALS — BP 187/92 | HR 71 | Resp 14 | Ht 61.0 in | Wt 169.7 lb

## 2012-03-13 DIAGNOSIS — Z48812 Encounter for surgical aftercare following surgery on the circulatory system: Secondary | ICD-10-CM

## 2012-03-13 DIAGNOSIS — T82598A Other mechanical complication of other cardiac and vascular devices and implants, initial encounter: Secondary | ICD-10-CM

## 2012-03-13 DIAGNOSIS — Z4931 Encounter for adequacy testing for hemodialysis: Secondary | ICD-10-CM

## 2012-03-13 DIAGNOSIS — N186 End stage renal disease: Secondary | ICD-10-CM

## 2012-03-13 NOTE — Progress Notes (Signed)
Vascular and Vein Specialist of [facility]  Patient name: Alexa Dougherty MRN: [id number] DOB: January 10, 1946 Sex: female  REASON FOR VISIT: follow up after left brachiocephalic AV fistula  HPI: Alexa Dougherty is a 66 y.o. female who had a left brachiocephalic AV fistula on 0000000. She comes in for a follow up visit. The history is obtained through her daughter the translator. She has had no pain or paresthesias in the left arm. He dialyzes on Tuesdays Thursdays and Saturdays.   REVIEW OF SYSTEMS: Valu.Nieves ] denotes positive finding; [  ] denotes negative finding  CARDIOVASCULAR:  [ ]  chest pain   [ ]  dyspnea on exertion    CONSTITUTIONAL:  [ ]  fever   [ ]  chills  PHYSICAL EXAM: Filed Vitals:   03/13/12 1555  BP: 187/92  Pulse: 71  Resp: 14  Height: 5\' 1"  (1.549 m)  Weight: 169 lb 11.2 oz (76.975 kg)  SpO2: 77%   Body mass index is 32.06 kg/(m^2). GENERAL: The patient is a well-nourished female, in no acute distress. The vital signs are documented above. CARDIOVASCULAR: There is a regular rate and rhythm  PULMONARY: There is good air exchange bilaterally without wheezing or rales. The fistula has an excellent thrill proximally. I cannot palpate a left radial pulse however the hand is warm and well-perfused.  Duplex scan shows 2 areas of increased velocity in the proximal fistula. Velocities were as high as 947 cm/s.  MEDICAL ISSUES:  End stage renal disease The left brachiocephalic AV fistula has an excellent thrill proximally but clearly there is some areas of stenosis in the proximal fistula based on her duplex scan. This reason I recommended we proceed with a fistulogram to further evaluate this and possible venoplasty if significant stenosis is found. He dialyzes on Tuesdays Thursdays and Saturdays. We'll arrange this on a nondialysis day. We will make further recommendations pending these results. We have discussed the indications for the procedure and the potential complications of  venoplasty including injury to the fistula. All of her questions were answered she is agreeable to proceed.   [place] [facility] of [place] Beeper: [phone]

## 2012-03-13 NOTE — Assessment & Plan Note (Signed)
The left brachiocephalic AV fistula has an excellent thrill proximally but clearly there is some areas of stenosis in the proximal fistula based on her duplex scan. This reason I recommended we proceed with a fistulogram to further evaluate this and possible venoplasty if significant stenosis is found. He dialyzes on Tuesdays Thursdays and Saturdays. We'll arrange this on a nondialysis day. We will make further recommendations pending these results. We have discussed the indications for the procedure and the potential complications of venoplasty including injury to the fistula. All of her questions were answered she is agreeable to proceed.

## 2012-03-27 ENCOUNTER — Encounter (HOSPITAL_COMMUNITY): Payer: Self-pay | Admitting: Pharmacy Technician

## 2012-03-27 ENCOUNTER — Other Ambulatory Visit: Payer: Self-pay

## 2012-03-31 MED ORDER — SODIUM CHLORIDE 0.9 % IJ SOLN: 3.0000 mL | INTRAMUSCULAR | Status: DC | PRN

## 2012-04-01 ENCOUNTER — Telehealth: Payer: Self-pay | Admitting: *Deleted

## 2012-04-01 ENCOUNTER — Encounter (HOSPITAL_COMMUNITY)
Admission: RE | Disposition: A | Discharge status: Home / Self Care | Payer: Self-pay | Source: Ambulatory Visit | Attending: Vascular Surgery

## 2012-04-01 ENCOUNTER — Ambulatory Visit (HOSPITAL_COMMUNITY)
Admission: RE | Admit: 2012-04-01 | Discharge: 2012-04-01 | Disposition: A | Discharge status: Home / Self Care | Payer: PRIVATE HEALTH INSURANCE | Source: Ambulatory Visit | Attending: Vascular Surgery | Admitting: Vascular Surgery

## 2012-04-01 DIAGNOSIS — N186 End stage renal disease: Secondary | ICD-10-CM | POA: Insufficient documentation

## 2012-04-01 DIAGNOSIS — T82898A Other specified complication of vascular prosthetic devices, implants and grafts, initial encounter: Secondary | ICD-10-CM

## 2012-04-01 DIAGNOSIS — Y832 Surgical operation with anastomosis, bypass or graft as the cause of abnormal reaction of the patient, or of later complication, without mention of misadventure at the time of the procedure: Secondary | ICD-10-CM | POA: Insufficient documentation

## 2012-04-01 DIAGNOSIS — Z992 Dependence on renal dialysis: Secondary | ICD-10-CM | POA: Insufficient documentation

## 2012-04-01 DIAGNOSIS — I871 Compression of vein: Secondary | ICD-10-CM | POA: Insufficient documentation

## 2012-04-01 HISTORY — PX: FISTULOGRAM: SHX5832

## 2012-04-01 LAB — POCT I-STAT, CHEM 8
BUN: 30 mg/dL — ABNORMAL HIGH (ref 6–23)
Calcium, Ion: 1.25 mmol/L (ref 1.13–1.30)
Chloride: 105 mEq/L (ref 96–112)
Creatinine, Ser: 6 mg/dL — ABNORMAL HIGH (ref 0.50–1.10)
Glucose, Bld: 92 mg/dL (ref 70–99)
HCT: 38 % (ref 36.0–46.0)
Hemoglobin: 12.9 g/dL (ref 12.0–15.0)
Potassium: 4.1 mEq/L (ref 3.5–5.1)
Sodium: 143 mEq/L (ref 135–145)
TCO2: 26 mmol/L (ref 0–100)

## 2012-04-01 SURGERY — FISTULOGRAM
Anesthesia: LOCAL

## 2012-04-01 MED ORDER — LIDOCAINE HCL (PF) 1 % IJ SOLN
INTRAMUSCULAR | Status: AC
  Filled 2012-04-01: qty 30

## 2012-04-01 MED ORDER — FENTANYL CITRATE 0.05 MG/ML IJ SOLN
INTRAMUSCULAR | Status: AC
  Filled 2012-04-01: qty 2

## 2012-04-01 MED ORDER — HEPARIN SODIUM (PORCINE) 1000 UNIT/ML IJ SOLN
INTRAMUSCULAR | Status: AC
  Filled 2012-04-01: qty 1

## 2012-04-01 NOTE — H&P (View-Only) (Signed)
Vascular and Vein Specialist of [facility]  Patient name: Lenamae Droge MRN: [id number] DOB: May 01, 1946 Sex: female  REASON FOR VISIT: follow up after left brachiocephalic AV fistula  HPI: Jenice Genthner is a 66 y.o. female who had a left brachiocephalic AV fistula on 0000000. She comes in for a follow up visit. The history is obtained through her daughter the translator. She has had no pain or paresthesias in the left arm. He dialyzes on Tuesdays Thursdays and Saturdays.   REVIEW OF SYSTEMS: Valu.Nieves ] denotes positive finding; [  ] denotes negative finding  CARDIOVASCULAR:  [ ]  chest pain   [ ]  dyspnea on exertion    CONSTITUTIONAL:  [ ]  fever   [ ]  chills  PHYSICAL EXAM: Filed Vitals:   03/13/12 1555  BP: 187/92  Pulse: 71  Resp: 14  Height: 5\' 1"  (1.549 m)  Weight: 169 lb 11.2 oz (76.975 kg)  SpO2: 77%   Body mass index is 32.06 kg/(m^2). GENERAL: The patient is a well-nourished female, in no acute distress. The vital signs are documented above. CARDIOVASCULAR: There is a regular rate and rhythm  PULMONARY: There is good air exchange bilaterally without wheezing or rales. The fistula has an excellent thrill proximally. I cannot palpate a left radial pulse however the hand is warm and well-perfused.  Duplex scan shows 2 areas of increased velocity in the proximal fistula. Velocities were as high as 947 cm/s.  MEDICAL ISSUES:  End stage renal disease The left brachiocephalic AV fistula has an excellent thrill proximally but clearly there is some areas of stenosis in the proximal fistula based on her duplex scan. This reason I recommended we proceed with a fistulogram to further evaluate this and possible venoplasty if significant stenosis is found. He dialyzes on Tuesdays Thursdays and Saturdays. We'll arrange this on a nondialysis day. We will make further recommendations pending these results. We have discussed the indications for the procedure and the potential complications of  venoplasty including injury to the fistula. All of her questions were answered she is agreeable to proceed.   [place] [facility] Ve[facility] Specialists of [place] Beeper: [phone]

## 2012-04-01 NOTE — Interval H&P Note (Signed)
History and Physical Interval Note:  04/01/2012 8:52 AM  Alexa Dougherty  has presented today for surgery, with the diagnosis of End stage renal  The various methods of treatment have been discussed with the patient and family. After consideration of risks, benefits and other options for treatment, the patient has consented to  Procedure(s) (LRB) with comments: FISTULOGRAM (N/A) and possible venoplasty. The patient's history has been reviewed, patient examined, no change in status, stable for surgery.  I have reviewed the patient's chart and labs.  Questions were answered to the patient's satisfaction.     DICKSON,CHRISTOPHER S

## 2012-04-01 NOTE — Op Note (Signed)
PATIENT: Alexa Dougherty  MRN: [id number] DOB: [birth year]    DATE OF PROCEDURE: 04/01/2012  INDICATIONS: [name] is a 66 y.o. female who had a left brachiocephalic fistula placed on 02/02/2012. Postoperative duplex suggested some elevated velocities in the proximal fistula and she is brought in for a fistulogram. Velocities were as high as 947 centimeters per second.  PROCEDURE:  1. Ultrasound-guided access to left brachial cephalic AV fistula 2. fistulogram left brachial cephalic AV fistula 3. venoplasty of left brachiocephalic AV fistula  SURGEON: Judeth Cornfield. Scot Dock, MD, FACS  ANESTHESIA: local with sedation   EBL: minimal  TECHNIQUE: The patient was brought to the peripheral vascular lab and sedated with 25 mcg of fentanyl. The left arm was prepped and draped in the usual sterile fashion. After the skin was anesthetized with 1% lidocaine, and under ultrasound guidance, the fistula was cannulated in a retrograde fashion and a guidewire was introduced into the fistula. A micropuncture sheath was introduced over the wire and then the wire and dilator were removed. Fistulogram was then obtained.  Next the fistula was compressed proximally to allow reflux into the arterial anastomosis. There was an area of stenosis in the proximal fistula of approximately 50%. I elected to angioplasty this. The micropuncture sheath was exchanged for a 6 French sheath over a wire and the patient received 3000 units of IV heparin. I selected a 5 mm x 2 cm balloon which was then positioned across the area of stenosis and inflated to 10 atmospheres for 1 minute. Follow up films showed some residual stenosis. I then went back with a 6 mm x 2 cm balloon and made 2 inflations to 10 atmospheres. The balloon inflated without any difficulty with no significant waste present. Follow up film did show some residual stenosis but I did not think that I could use a larger balloon without risking rupturing the fistula. In  addition given that there was no waist with inflation of the balloon I did not think that the residual stenosis was significant. A 4-0 Monocryl suture was placed around the catheter site and then sheath was removed and pressure held for hemostasis. No immediate complications were noted.  FINDINGS:  1. The left brachiocephalic fistula is widely patent. 2. There is a 1 competing branch in the mid to distal upper arm. 3. The area of stenosis in the proximal fistula was ballooned as described above with some mild residual stenosis however this does not appear to be significant.  [name], MD, FACS Vascular and Vein Specialists of [facility]  DATE OF DICTATION:   04/01/2012

## 2012-04-01 NOTE — Telephone Encounter (Signed)
Message copied by Alfonso Patten on Mon Apr 01, 2012 11:20 AM ------      Message from: Angelia Mould      Created: Mon Apr 01, 2012  9:43 AM      Regarding: charge       PROCEDURE:        1. Ultrasound-guided access to left brachial cephalic AV fistula      2. fistulogram left brachial cephalic AV fistula      3. venoplasty of left brachiocephalic AV fistula                  SURGEON: Judeth Cornfield. Scot Dock, MD, FACS            She does not need a follow up visit. They can use her fistula in one month. Thank you.      CSD

## 2012-05-16 ENCOUNTER — Emergency Department (HOSPITAL_BASED_OUTPATIENT_CLINIC_OR_DEPARTMENT_OTHER)
Admission: EM | Admit: 2012-05-16 | Discharge: 2012-05-16 | Disposition: A | Discharge status: Home / Self Care | Payer: PRIVATE HEALTH INSURANCE | Attending: Emergency Medicine | Admitting: Emergency Medicine

## 2012-05-16 ENCOUNTER — Encounter (HOSPITAL_BASED_OUTPATIENT_CLINIC_OR_DEPARTMENT_OTHER): Payer: Self-pay | Admitting: *Deleted

## 2012-05-16 DIAGNOSIS — R5383 Other fatigue: Secondary | ICD-10-CM

## 2012-05-16 DIAGNOSIS — D649 Anemia, unspecified: Secondary | ICD-10-CM | POA: Insufficient documentation

## 2012-05-16 DIAGNOSIS — Z79899 Other long term (current) drug therapy: Secondary | ICD-10-CM | POA: Insufficient documentation

## 2012-05-16 DIAGNOSIS — K219 Gastro-esophageal reflux disease without esophagitis: Secondary | ICD-10-CM | POA: Insufficient documentation

## 2012-05-16 DIAGNOSIS — R5381 Other malaise: Secondary | ICD-10-CM | POA: Insufficient documentation

## 2012-05-16 DIAGNOSIS — N186 End stage renal disease: Secondary | ICD-10-CM | POA: Insufficient documentation

## 2012-05-16 DIAGNOSIS — Z992 Dependence on renal dialysis: Secondary | ICD-10-CM | POA: Insufficient documentation

## 2012-05-16 DIAGNOSIS — D631 Anemia in chronic kidney disease: Secondary | ICD-10-CM

## 2012-05-16 DIAGNOSIS — N189 Chronic kidney disease, unspecified: Secondary | ICD-10-CM

## 2012-05-16 DIAGNOSIS — R42 Dizziness and giddiness: Secondary | ICD-10-CM | POA: Insufficient documentation

## 2012-05-16 DIAGNOSIS — I120 Hypertensive chronic kidney disease with stage 5 chronic kidney disease or end stage renal disease: Secondary | ICD-10-CM | POA: Insufficient documentation

## 2012-05-16 DIAGNOSIS — Z8739 Personal history of other diseases of the musculoskeletal system and connective tissue: Secondary | ICD-10-CM | POA: Insufficient documentation

## 2012-05-16 HISTORY — DX: Dependence on renal dialysis: Z99.2

## 2012-05-16 LAB — CBC
HCT: 22.2 % — ABNORMAL LOW (ref 36.0–46.0)
Hemoglobin: 7 g/dL — ABNORMAL LOW (ref 12.0–15.0)
MCH: 27.5 pg (ref 26.0–34.0)
MCHC: 31.5 g/dL (ref 30.0–36.0)
MCV: 87.1 fL (ref 78.0–100.0)
Platelets: 133 10*3/uL — ABNORMAL LOW (ref 150–400)
RBC: 2.55 MIL/uL — ABNORMAL LOW (ref 3.87–5.11)
RDW: 16.3 % — ABNORMAL HIGH (ref 11.5–15.5)
WBC: 4.1 10*3/uL (ref 4.0–10.5)

## 2012-05-16 LAB — BASIC METABOLIC PANEL
BUN: 24 mg/dL — ABNORMAL HIGH (ref 6–23)
CO2: 29 mEq/L (ref 19–32)
Calcium: 8.8 mg/dL (ref 8.4–10.5)
Chloride: 98 mEq/L (ref 96–112)
Creatinine, Ser: 4.3 mg/dL — ABNORMAL HIGH (ref 0.50–1.10)
GFR calc Af Amer: 11 mL/min — ABNORMAL LOW (ref >90)
GFR calc non Af Amer: 10 mL/min — ABNORMAL LOW (ref >90)
Glucose, Bld: 114 mg/dL — ABNORMAL HIGH (ref 70–99)
Potassium: 3.4 mEq/L — ABNORMAL LOW (ref 3.5–5.1)
Sodium: 139 mEq/L (ref 135–145)

## 2012-05-16 LAB — TROPONIN I: Troponin I: <0.3 ng/mL (ref <0.30)

## 2012-05-16 LAB — PROTIME-INR
INR: 0.98 (ref 0.00–1.49)
Prothrombin Time: 12.9 seconds (ref 11.6–15.2)

## 2012-05-16 LAB — OCCULT BLOOD X 1 CARD TO LAB, STOOL: Fecal Occult Bld: NEGATIVE

## 2012-05-16 NOTE — ED Notes (Addendum)
Pt called out stating she couldn't breathe. Pt found to be hyperventilating at a rate of 30. Pt placed on O2 at 2lpm and encouraged to slow her breathing. Pt able to speak. O2 sat is 100%. Dr. Prince Rome notified and gave VO to check pt's troponin.

## 2012-05-16 NOTE — ED Notes (Signed)
Pt. Was at dialysis today.  MD at dialysis sent pt. To ED for evaluation of Hgb.  Hgb today at dialysis was 6.5.  Pt. C/o generalized weakness and feeling tired.

## 2012-05-16 NOTE — ED Notes (Signed)
Pt has dialysis today- c/o weakness and not feeling well since then-

## 2012-05-16 NOTE — ED Notes (Signed)
Pt's pulse ox remained 98% and pulse of 90 while ambulating. No SOB noted. Pt ambulated unassisted and with steady gait.

## 2012-05-16 NOTE — ED Provider Notes (Signed)
History     CSN: [id number]  Arrival date & time 05/16/12  1801   First MD Initiated Contact with Patient 05/16/12 1921      Chief Complaint  Patient presents with  . Weakness    dialysis pt    (Consider location/radiation/quality/duration/timing/severity/associated sxs/prior treatment) HPI Comments: Ms. Dantona presents for evaluation of weakness and fatigue.  She reports feeling extremely tired.  She was seen at her regular dialysis appointment earlier today and completed her usual treatment.  She complains of feeling dizzy when standing up and tired.  She denies fever, HA, CP, SOB, palpitations, NVD, melena, hematochezia, and unilateral weakness.  Patient is a 66 y.o. female presenting with weakness. The history is provided by the patient and a relative. The history is limited by a language barrier (daughter is acting as a Optometrist).  Weakness The primary symptoms include dizziness. Primary symptoms do not include headaches, syncope, loss of consciousness, altered mental status, seizures, visual change, paresthesias, focal weakness, loss of sensation, memory loss, fever, nausea or vomiting. The symptoms began 12 to 24 hours ago. The symptoms are worsening. The neurological symptoms are diffuse. The symptoms occurred after standing up and on exertion.  Dizziness also occurs with weakness. Dizziness does not occur with tinnitus, nausea, vomiting or diaphoresis.  Additional symptoms include weakness. Additional symptoms do not include neck stiffness, pain, lower back pain, leg pain, loss of balance, hallucinations, nystagmus, taste disturbance, hyperacusis, tinnitus, vertigo, anxiety, irritability or dysphoric mood. Medical issues also include hypertension.    Past Medical History  Diagnosis Date  . Anemia   . Hypertension   . GERD (gastroesophageal reflux disease)   . Headache   . Arthritis   . Chronic kidney disease   . Dialysis patient     Past Surgical History  Procedure  Date  . Tubal ligation 1980  . Av fistula placement 02/02/2012    Procedure: ARTERIOVENOUS (AV) FISTULA CREATION;  Surgeon: Angelia Mould, MD;  Location: [place];  Service: Vascular;  Laterality: Left;  . Insertion of dialysis catheter 02/02/2012    Procedure: INSERTION OF DIALYSIS CATHETER;  Surgeon: Angelia Mould, MD;  Location: [facility];  Service: Vascular;  Laterality: Right;  Insertion of 23cm dialysis catheter in Right Internal Jugular     Family History  Problem Relation Age of Onset  . Hypertension Mother   . Cancer Sister   . Cancer Brother   . Hypertension Daughter     History  Substance Use Topics  . Smoking status: Never Smoker   . Smokeless tobacco: Never Used  . Alcohol Use: No    OB History    Grav Para Term Preterm Abortions TAB SAB Ect Mult Living                  Review of Systems  Constitutional: Positive for fatigue. Negative for fever, chills, diaphoresis, activity change, appetite change and irritability.  HENT: Negative for congestion, sore throat, drooling, trouble swallowing, neck stiffness and tinnitus.   Eyes: Negative for visual disturbance.  Respiratory: Negative for apnea, cough, chest tightness and shortness of breath.   Cardiovascular: Negative for chest pain, palpitations, leg swelling and syncope.  Gastrointestinal: Negative for nausea, vomiting, abdominal pain and diarrhea.  Genitourinary: Negative.   Musculoskeletal: Negative.   Skin: Negative.   Neurological: Positive for dizziness and weakness. Negative for vertigo, tremors, focal weakness, seizures, loss of consciousness, syncope, facial asymmetry, light-headedness, headaches, paresthesias and loss of balance.  Psychiatric/Behavioral: Negative for hallucinations, memory loss,  dysphoric mood and altered mental status.    Allergies  Review of patient's allergies indicates no known allergies.  Home Medications   Current Outpatient Rx  Name  Route  Sig  Dispense  Refill  .  ATORVASTATIN CALCIUM 10 MG PO TABS   Oral   Take 10 mg by mouth daily.         . DEXLANSOPRAZOLE 60 MG PO CPDR   Oral   Take 60 mg by mouth daily.         Marland Kitchen DOXAZOSIN MESYLATE 1 MG PO TABS   Oral   Take 1 mg by mouth daily. Take 1 tab at bedtime for 2 weeks starting 01/17/2012, then take 2 q day after         . POLYSACCHARIDE IRON COMPLEX 150 MG PO CAPS   Oral   Take 150 mg by mouth daily.         Marland Kitchen METOPROLOL SUCCINATE ER 50 MG PO TB24   Oral   Take 50 mg by mouth daily.         Marland Kitchen OMEPRAZOLE 20 MG PO CPDR   Oral   Take 20 mg by mouth as needed.          Marland Kitchen RENVELA 800 MG PO TABS   Oral   Take 1,600 mg by mouth 3 (three) times daily with meals.            BP 199/86  Pulse 80  Temp 97.3 F (36.3 C) (Oral)  Resp 16  Ht 5\' 6"  (1.676 m)  Wt 165 lb 2 oz (74.9 kg)  BMI 26.65 kg/m2  SpO2 100%  Physical Exam  Nursing note and vitals reviewed. Constitutional: She is oriented to person, place, and time. She appears well-developed and well-nourished. No distress.  HENT:  Head: Normocephalic and atraumatic.  Right Ear: External ear normal.  Left Ear: External ear normal.  Nose: Nose normal.  Mouth/Throat: Oropharynx is clear and moist. No oropharyngeal exudate.  Eyes: Conjunctivae normal and EOM are normal. Pupils are equal, round, and reactive to light. Right eye exhibits no discharge. Left eye exhibits no discharge. No scleral icterus.       No nystagmus   Neck: Normal range of motion. Neck supple. No JVD present. No tracheal deviation present.  Cardiovascular: Normal rate, regular rhythm and intact distal pulses.  Exam reveals no gallop and no friction rub.   No murmur heard. Pulmonary/Chest: Effort normal and breath sounds normal. No stridor. No respiratory distress. She has no wheezes. She has no rales. She exhibits no tenderness.  Abdominal: Soft. Bowel sounds are normal. She exhibits no distension and no mass. There is no tenderness. There is no rebound  and no guarding.  Musculoskeletal: Normal range of motion. She exhibits no edema and no tenderness.       + dialysis graft in left arm.  Note thrill and bruit with intact distal pulse and nl capillary refill.  Lymphadenopathy:    She has no cervical adenopathy.  Neurological: She is alert and oriented to person, place, and time. No cranial nerve deficit. Coordination normal.  Skin: Skin is warm and dry. No rash noted. She is not diaphoretic. No erythema. No pallor.  Psychiatric: She has a normal mood and affect. Her behavior is normal.    ED Course  Procedures (including critical care time)   Labs Reviewed  CBC  BASIC METABOLIC PANEL  PROTIME-INR  TYPE AND SCREEN   No results found.   No diagnosis  found.    MDM  Pt presents for evaluation of weakness and fatigue.  She currently appears nontoxic, note elevated BP, NAD.  She has ESRD and is on dialysis (T, Th, Sat) and it has been noted that her hemoglobin and hematocrit are significantly lower than her recent baseline.  She denies bleeding or melena and has been taking erythropoietin.  Will obtain basic labs, coags, orthostatic VS, EKG, and pulse ox while ambulating.  Will reassess.  Currently she has no focal neurologic deficits.   2130.  Pt c/o feeling tired.  She is noted to have an elevated respiratory rate.  ECG performed.   Date: 05/16/2012  Rate: 82 bpm  Rhythm: normal sinus rhythm  QRS Axis: normal  Intervals: normal  ST/T Wave abnormalities: normal  Conduction Disutrbances:none  Narrative Interpretation:   Old EKG Reviewed: unchanged  2325.  Pt stable, NAD. She appears comfortable.  Note negative orthostatic vital signs.  She denies chest pain or shortness of breath.  She has anemia that is worse than previously available labs but she is not responding clinically as if the anemia is acute.  Trop obtained at 2140 is negative.  She is also negative for fecal occult blood.  Will consider this a delta troponin based on  the timing of her symptoms and when the lab was drawn.  At this time will not transfuse PRBCs.  Plan discharge home.  She will follow-up in 48 hours at her routine dialysis appointment.        Perlie Mayo, MD 05/16/12 [phone]

## 2012-05-18 ENCOUNTER — Encounter (HOSPITAL_COMMUNITY): Payer: Self-pay | Admitting: *Deleted

## 2012-05-18 ENCOUNTER — Emergency Department (HOSPITAL_COMMUNITY)
Admission: EM | Admit: 2012-05-18 | Discharge: 2012-05-19 | Disposition: A | Discharge status: Home / Self Care | Payer: PRIVATE HEALTH INSURANCE | Attending: Emergency Medicine | Admitting: Emergency Medicine

## 2012-05-18 DIAGNOSIS — R5381 Other malaise: Secondary | ICD-10-CM | POA: Insufficient documentation

## 2012-05-18 DIAGNOSIS — Z79899 Other long term (current) drug therapy: Secondary | ICD-10-CM | POA: Insufficient documentation

## 2012-05-18 DIAGNOSIS — Z992 Dependence on renal dialysis: Secondary | ICD-10-CM | POA: Insufficient documentation

## 2012-05-18 DIAGNOSIS — N186 End stage renal disease: Secondary | ICD-10-CM | POA: Insufficient documentation

## 2012-05-18 DIAGNOSIS — D649 Anemia, unspecified: Secondary | ICD-10-CM | POA: Insufficient documentation

## 2012-05-18 DIAGNOSIS — I120 Hypertensive chronic kidney disease with stage 5 chronic kidney disease or end stage renal disease: Secondary | ICD-10-CM | POA: Insufficient documentation

## 2012-05-18 DIAGNOSIS — R710 Precipitous drop in hematocrit: Secondary | ICD-10-CM | POA: Insufficient documentation

## 2012-05-18 DIAGNOSIS — Z8739 Personal history of other diseases of the musculoskeletal system and connective tissue: Secondary | ICD-10-CM | POA: Insufficient documentation

## 2012-05-18 DIAGNOSIS — R5383 Other fatigue: Secondary | ICD-10-CM | POA: Insufficient documentation

## 2012-05-18 DIAGNOSIS — K219 Gastro-esophageal reflux disease without esophagitis: Secondary | ICD-10-CM | POA: Insufficient documentation

## 2012-05-18 DIAGNOSIS — Z9851 Tubal ligation status: Secondary | ICD-10-CM | POA: Insufficient documentation

## 2012-05-18 LAB — COMPREHENSIVE METABOLIC PANEL
ALT: 14 U/L (ref 0–35)
AST: 28 U/L (ref 0–37)
Albumin: 3.6 g/dL (ref 3.5–5.2)
Alkaline Phosphatase: 99 U/L (ref 39–117)
BUN: 13 mg/dL (ref 6–23)
CO2: 31 mEq/L (ref 19–32)
Calcium: 9.1 mg/dL (ref 8.4–10.5)
Chloride: 93 mEq/L — ABNORMAL LOW (ref 96–112)
Creatinine, Ser: 2.95 mg/dL — ABNORMAL HIGH (ref 0.50–1.10)
GFR calc Af Amer: 18 mL/min — ABNORMAL LOW (ref >90)
GFR calc non Af Amer: 15 mL/min — ABNORMAL LOW (ref >90)
Glucose, Bld: 123 mg/dL — ABNORMAL HIGH (ref 70–99)
Potassium: 3.3 mEq/L — ABNORMAL LOW (ref 3.5–5.1)
Sodium: 133 mEq/L — ABNORMAL LOW (ref 135–145)
Total Bilirubin: 0.3 mg/dL (ref 0.3–1.2)
Total Protein: 7.5 g/dL (ref 6.0–8.3)

## 2012-05-18 LAB — CBC
HCT: 23 % — ABNORMAL LOW (ref 36.0–46.0)
Hemoglobin: 7.3 g/dL — ABNORMAL LOW (ref 12.0–15.0)
MCH: 27.1 pg (ref 26.0–34.0)
MCHC: 31.7 g/dL (ref 30.0–36.0)
MCV: 85.5 fL (ref 78.0–100.0)
Platelets: 163 10*3/uL (ref 150–400)
RBC: 2.69 MIL/uL — ABNORMAL LOW (ref 3.87–5.11)
RDW: 16.8 % — ABNORMAL HIGH (ref 11.5–15.5)
WBC: 6.2 10*3/uL (ref 4.0–10.5)

## 2012-05-18 LAB — PREPARE RBC (CROSSMATCH)

## 2012-05-18 LAB — ABO/RH: ABO/RH(D): A POS

## 2012-05-18 NOTE — ED Provider Notes (Signed)
History     CSN: [id number]  Arrival date & time 05/18/12  [id number]   First MD Initiated Contact with Patient 05/18/12 2222      Chief Complaint  Patient presents with  . Anemia  . Abnormal Lab     HPI chief complaint anemia. Uterine by private vehicle. History provided by patient and family. No language barriers identified. Information not limited. Onset more than 3 days ago. Anemia not worsened or improved by anything. Timing constant. Duration greater than one week. Context the patient was transferred from dialysis center. No associated signs and symptoms other than fatigue. No treatments prior to arrival. Patient a full dialysis session today in regards to recent medical care. Regarding social history see nurse's notes. I have reviewed patient's past medical, past surgical, social chisels medications and allergies.  Past Medical History  Diagnosis Date  . Anemia   . Hypertension   . GERD (gastroesophageal reflux disease)   . Headache   . Arthritis   . Chronic kidney disease   . Dialysis patient     Past Surgical History  Procedure Date  . Tubal ligation 1980  . Av fistula placement 02/02/2012    Procedure: ARTERIOVENOUS (AV) FISTULA CREATION;  Surgeon: Angelia Mould, MD;  Location: [place];  Service: Vascular;  Laterality: Left;  . Insertion of dialysis catheter 02/02/2012    Procedure: INSERTION OF DIALYSIS CATHETER;  Surgeon: Angelia Mould, MD;  Location: [place];  Service: Vascular;  Laterality: Right;  Insertion of 23cm dialysis catheter in Right Internal Jugular     Family History  Problem Relation Age of Onset  . Hypertension Mother   . Cancer Sister   . Cancer Brother   . Hypertension Daughter     History  Substance Use Topics  . Smoking status: Never Smoker   . Smokeless tobacco: Never Used  . Alcohol Use: No    OB History    Grav Para Term Preterm Abortions TAB SAB Ect Mult Living                  Review of Systems  Constitutional:  Positive for fatigue. Negative for fever and chills.  HENT: Negative for trouble swallowing, neck pain and neck stiffness.   Eyes: Negative for pain, discharge and itching.  Respiratory: Negative for cough, chest tightness and shortness of breath.   Cardiovascular: Negative for chest pain, palpitations and leg swelling.  Gastrointestinal: Negative for nausea, vomiting, abdominal pain, diarrhea, constipation, blood in stool and anal bleeding.  Genitourinary: Negative for dysuria, urgency, frequency, hematuria, flank pain, decreased urine volume, vaginal bleeding, difficulty urinating and pelvic pain.  Musculoskeletal: Negative for back pain and joint swelling.  Skin: Negative for rash and wound.  Neurological: Negative for dizziness, tremors, seizures, syncope, facial asymmetry, speech difficulty, weakness, light-headedness, numbness and headaches.  Hematological: Negative for adenopathy. Does not bruise/bleed easily.  Psychiatric/Behavioral: Negative for confusion and decreased concentration.    Allergies  Review of patient's allergies indicates no known allergies.  Home Medications   Current Outpatient Rx  Name  Route  Sig  Dispense  Refill  . ATORVASTATIN CALCIUM 10 MG PO TABS   Oral   Take 10 mg by mouth daily.         Marland Kitchen NEPHRO-VITE 0.8 MG PO TABS   Oral   Take 0.8 mg by mouth daily.         . DEXLANSOPRAZOLE 60 MG PO CPDR   Oral   Take 60 mg by mouth  daily.         Marland Kitchen DIAZEPAM 2 MG PO TABS   Oral   Take 2 mg by mouth every 6 (six) hours as needed. For dizziness         . METOPROLOL SUCCINATE ER 50 MG PO TB24   Oral   Take 50 mg by mouth daily.         Marland Kitchen OMEPRAZOLE 20 MG PO CPDR   Oral   Take 20 mg by mouth daily as needed. For GERD         . RENVELA 800 MG PO TABS   Oral   Take 1,600 mg by mouth 3 (three) times daily with meals.            BP 178/75  Pulse 72  Temp 97.8 F (36.6 C) (Oral)  Resp 16  SpO2 95%  Physical Exam  Constitutional:  She is oriented to person, place, and time. She appears well-developed and well-nourished. No distress.  HENT:  Head: Normocephalic and atraumatic.  Eyes: Conjunctivae normal are normal. Right eye exhibits no discharge. Left eye exhibits no discharge. No scleral icterus.  Neck: Normal range of motion. Neck supple.  Cardiovascular: Normal rate, regular rhythm, normal heart sounds and intact distal pulses.   No murmur heard. Pulmonary/Chest: Effort normal and breath sounds normal. No respiratory distress.  Abdominal: Soft. Bowel sounds are normal. She exhibits no distension. There is no tenderness.  Musculoskeletal: Normal range of motion. She exhibits no tenderness.  Neurological: She is alert and oriented to person, place, and time.  Skin: Skin is warm and dry. She is not diaphoretic.  Psychiatric: She has a normal mood and affect.    ED Course  Procedures (including critical care time)  Labs Reviewed  CBC - Abnormal; Notable for the following:    RBC 2.69 (*)     Hemoglobin 7.3 (*)     HCT 23.0 (*)     RDW 16.8 (*)     All other components within normal limits  COMPREHENSIVE METABOLIC PANEL - Abnormal; Notable for the following:    Sodium 133 (*)     Potassium 3.3 (*)     Chloride 93 (*)     Glucose, Bld 123 (*)     Creatinine, Ser 2.95 (*)     GFR calc non Af Amer 15 (*)     GFR calc Af Amer 18 (*)     All other components within normal limits  TYPE AND SCREEN  ABO/RH  PREPARE RBC (CROSSMATCH)   No results found.   1. End stage renal disease   2. Anemia       MDM  Patient is a very well-appearing 66 year old female with history of end-stage renal disease on dialysis sent from her dialysis center with a reported hemoglobin of 6. Hemoglobin 7.3 here. No reported history of bleeding. Patient a negative Hemoccult 2 days ago. No reported GI bleeding in the interim. No chest pain shortness of breath dizziness or syncope. Discuss patient with on-call nephrologist who  recommended transfusing one unit discharging patient home with instructions to resume dialysis on Monday. Transfer to CDU for blood transfusion.        Charlotte Sanes, MD 05/18/12 [phone]

## 2012-05-18 NOTE — ED Notes (Signed)
Per family member, pt has low hemoglobin of 6 since dialysis on Thursday, sent here for blood transfusion. Last dialysis was today.

## 2012-05-19 LAB — TYPE AND SCREEN
ABO/RH(D): A POS
Antibody Screen: NEGATIVE
Unit division: 0

## 2012-05-19 NOTE — ED Notes (Signed)
Pt discharged.Vital signs stable and GCS 15 

## 2012-05-19 NOTE — ED Provider Notes (Signed)
Pt stable, no distress, she tolerated the transfusion Plan is for her to f/u as outpatient and with her dialysis   Sharyon Cable, MD 05/19/12 [phone]

## 2012-05-19 NOTE — ED Notes (Signed)
Blood transfusion was uneventfull.

## 2012-05-19 NOTE — ED Provider Notes (Addendum)
I saw and evaluated the patient, reviewed the resident's note and I agree with the findings and plan.   .Face to face Exam:  General:  Awake HEENT:  Atraumatic Resp:  Normal effort Abd:  Nondistended Neuro:No focal weakness    Dot Lanes, MD 09/25/12 2255

## 2012-05-27 ENCOUNTER — Encounter (HOSPITAL_COMMUNITY)
Admission: RE | Admit: 2012-05-27 | Discharge: 2012-05-27 | Disposition: A | Discharge status: Home / Self Care | Payer: PRIVATE HEALTH INSURANCE | Source: Ambulatory Visit | Attending: Vascular Surgery | Admitting: Vascular Surgery

## 2012-05-27 ENCOUNTER — Other Ambulatory Visit: Payer: Self-pay

## 2012-05-27 DIAGNOSIS — N189 Chronic kidney disease, unspecified: Secondary | ICD-10-CM | POA: Insufficient documentation

## 2012-05-27 DIAGNOSIS — Z992 Dependence on renal dialysis: Secondary | ICD-10-CM | POA: Insufficient documentation

## 2012-05-27 DIAGNOSIS — I129 Hypertensive chronic kidney disease with stage 1 through stage 4 chronic kidney disease, or unspecified chronic kidney disease: Secondary | ICD-10-CM | POA: Insufficient documentation

## 2012-05-27 DIAGNOSIS — Z452 Encounter for adjustment and management of vascular access device: Secondary | ICD-10-CM | POA: Insufficient documentation

## 2012-05-27 DIAGNOSIS — N186 End stage renal disease: Secondary | ICD-10-CM

## 2012-05-27 DIAGNOSIS — K219 Gastro-esophageal reflux disease without esophagitis: Secondary | ICD-10-CM | POA: Insufficient documentation

## 2012-05-27 NOTE — Progress Notes (Signed)
[facility] SHORT STAY H&P  CC:  Catheter removal   HPI:  This is a 67 y.o. female here for diatek catheter removal.  She has a working fistula in the left upper arm.    Past Medical History  Diagnosis Date  . Anemia   . Hypertension   . GERD (gastroesophageal reflux disease)   . Headache   . Arthritis   . Chronic kidney disease   . Dialysis patient     FH:  Non-Contributory  History   Social History  . Marital Status: Widowed    Spouse Name: N/A    Number of Children: N/A  . Years of Education: N/A   Occupational History  . Not on file.   Social History Main Topics  . Smoking status: Never Smoker   . Smokeless tobacco: Never Used  . Alcohol Use: No  . Drug Use: No  . Sexually Active: Not on file   Other Topics Concern  . Not on file   Social History Narrative  . No narrative on file    No Known Allergies  Current Outpatient Prescriptions  Medication Sig Dispense Refill  . atorvastatin (LIPITOR) 10 MG tablet Take 10 mg by mouth daily.      Marland Kitchen b complex-vitamin c-folic acid (NEPHRO-VITE) 0.8 MG TABS Take 0.8 mg by mouth daily.      Marland Kitchen dexlansoprazole (DEXILANT) 60 MG capsule Take 60 mg by mouth daily.      . diazepam (VALIUM) 2 MG tablet Take 2 mg by mouth every 6 (six) hours as needed. For dizziness      . metoprolol succinate (TOPROL-XL) 50 MG 24 hr tablet Take 50 mg by mouth daily.      Marland Kitchen omeprazole (PRILOSEC) 20 MG capsule Take 20 mg by mouth daily as needed. For GERD      . RENVELA 800 MG tablet Take 1,600 mg by mouth 3 (three) times daily with meals.         ROS:  See HPI  PHYSICAL EXAM  Filed Vitals:   05/27/12 1308  BP: 180/84  Pulse: 75  Temp: 97.6 F (36.4 C)  Resp: 18    Gen:  Well developed well nourished HEENT:  normocephalic Neck:  Right IJ palpable Heart:  RRR Lungs:  Non-labored Abdomen:  soft Skin:  No obvious rashes Palpable thrill in the left upper arm fistula.  N/V/M intact bilateral upper  extremities.  Lab/X-ray:  Impression: This is a 67 y.o. female here for diatek catheter removal  Plan:  Removal of right diatek catheter  Alexa Lins, Alexa Dougherty [facility] [phone] 05/27/2012 2:02 PM   [facility] Catheter Removal Procedure Note  Diagnosis: ESRD with Functioning AVF/AVGG  Plan:  Remove right diatek catheter  Consent signed:  yes Time out completed:  yes Coumadin:  no PT/INR (if applicable):   Other labs:   Procedure: 1.  Sterile prepping and draping over catheter area 2. 0 ml 2% lidocaine plain instilled at removal site. 3.  right catheter removed in its entirety with cuff in tact. 4.  Complications: none  5. Tip of catheter sent for culture:  no   Patient tolerated procedure well:  yes Pressure held, no bleeding noted, dressing applied Instructions given to the pt regarding wound care and bleeding.  Other:  Dougherty, Alexa MAUREEN 05/27/2012 2:02 PM   [facility] Catheter Removal Instructions   Please sit up at least 30 degrees for 4 hours then may  lay flat   MAY REMOVE DRESSINGS IN AM AND MAY SHOWER   REPLACE DRESSING OVER EXIT SITE AS NEEDED  IF YOU SHOULD NOTE BLEEDING OR SWELLING AT THE NECK HOLD PRESSURE OVER THE DRESSINGS FOR 15 MINUTES, IF BLEEDING PERSISTS COME TO ER OR CALL 911

## 2012-07-10 ENCOUNTER — Other Ambulatory Visit (HOSPITAL_COMMUNITY): Payer: Self-pay | Admitting: Nephrology

## 2012-07-10 DIAGNOSIS — N186 End stage renal disease: Secondary | ICD-10-CM

## 2012-07-12 ENCOUNTER — Ambulatory Visit (HOSPITAL_COMMUNITY)
Admission: RE | Admit: 2012-07-12 | Discharge: 2012-07-12 | Disposition: A | Discharge status: Home / Self Care | Payer: PRIVATE HEALTH INSURANCE | Source: Ambulatory Visit | Attending: Nephrology | Admitting: Nephrology

## 2012-07-12 DIAGNOSIS — N186 End stage renal disease: Secondary | ICD-10-CM | POA: Insufficient documentation

## 2012-07-12 DIAGNOSIS — T82898A Other specified complication of vascular prosthetic devices, implants and grafts, initial encounter: Secondary | ICD-10-CM | POA: Insufficient documentation

## 2012-07-12 DIAGNOSIS — Y832 Surgical operation with anastomosis, bypass or graft as the cause of abnormal reaction of the patient, or of later complication, without mention of misadventure at the time of the procedure: Secondary | ICD-10-CM | POA: Insufficient documentation

## 2012-07-12 MED ORDER — IOHEXOL 300 MG/ML  SOLN
100.0000 mL | Freq: Once | INTRAMUSCULAR | Status: AC | PRN
  Administered 2012-07-12: 32 mL via INTRAVENOUS

## 2012-07-12 NOTE — Procedures (Signed)
  Clinical history:End-stage renal disease and decreased access flows  from the left upper arm fistula.  PROCEDURE(S): LEFT UPPER EXTREMITY FISTULOGRAM  Physician: Stephan Minister. Henn, MD  Medications:None  Moderate sedation time:None  Fluoroscopy time: 0.7 minutes  Contrast: 32 ml Omnipaque-300  Procedure:An angiocath was placed in the left upper arm cephalic  vein. A series of fistulogram images were obtained. Catheter was  removed with manual compression.  Findings:The left upper arm cephalic vein is patent. The cephalic  vein is small but no focal area of stenosis. Arterial anastomosis  is patent. The central veins are patent. There are small  collateral vessels in the mid cephalic vein.  Complications: None  Impression:  The left upper arm cephalic vein fistula is patent without focal  stenosis.  The left upper cephalic vein has small collateral or competing  vessels.

## 2014-04-30 ENCOUNTER — Encounter (HOSPITAL_COMMUNITY): Payer: Self-pay | Admitting: Vascular Surgery

## 2014-05-22 HISTORY — PX: COLONOSCOPY: SHX174

## 2014-06-19 DIAGNOSIS — N186 End stage renal disease: Secondary | ICD-10-CM | POA: Diagnosis not present

## 2014-06-19 DIAGNOSIS — N2581 Secondary hyperparathyroidism of renal origin: Secondary | ICD-10-CM | POA: Diagnosis not present

## 2014-06-19 DIAGNOSIS — D509 Iron deficiency anemia, unspecified: Secondary | ICD-10-CM | POA: Diagnosis not present

## 2014-06-19 DIAGNOSIS — D631 Anemia in chronic kidney disease: Secondary | ICD-10-CM | POA: Diagnosis not present

## 2014-06-21 DIAGNOSIS — N186 End stage renal disease: Secondary | ICD-10-CM | POA: Diagnosis not present

## 2014-06-21 DIAGNOSIS — Z992 Dependence on renal dialysis: Secondary | ICD-10-CM | POA: Diagnosis not present

## 2014-07-02 DIAGNOSIS — N186 End stage renal disease: Secondary | ICD-10-CM | POA: Diagnosis not present

## 2014-07-02 DIAGNOSIS — T82858D Stenosis of vascular prosthetic devices, implants and grafts, subsequent encounter: Secondary | ICD-10-CM | POA: Diagnosis not present

## 2014-07-02 DIAGNOSIS — Z992 Dependence on renal dialysis: Secondary | ICD-10-CM | POA: Diagnosis not present

## 2014-07-02 DIAGNOSIS — I871 Compression of vein: Secondary | ICD-10-CM | POA: Diagnosis not present

## 2014-07-20 DIAGNOSIS — Z992 Dependence on renal dialysis: Secondary | ICD-10-CM | POA: Diagnosis not present

## 2014-07-20 DIAGNOSIS — R52 Pain, unspecified: Secondary | ICD-10-CM | POA: Diagnosis not present

## 2014-07-20 DIAGNOSIS — N2581 Secondary hyperparathyroidism of renal origin: Secondary | ICD-10-CM | POA: Diagnosis not present

## 2014-07-20 DIAGNOSIS — D631 Anemia in chronic kidney disease: Secondary | ICD-10-CM | POA: Diagnosis not present

## 2014-07-20 DIAGNOSIS — N186 End stage renal disease: Secondary | ICD-10-CM | POA: Diagnosis not present

## 2014-08-19 DIAGNOSIS — D509 Iron deficiency anemia, unspecified: Secondary | ICD-10-CM | POA: Diagnosis not present

## 2014-08-19 DIAGNOSIS — N2581 Secondary hyperparathyroidism of renal origin: Secondary | ICD-10-CM | POA: Diagnosis not present

## 2014-08-19 DIAGNOSIS — R52 Pain, unspecified: Secondary | ICD-10-CM | POA: Diagnosis not present

## 2014-08-19 DIAGNOSIS — N186 End stage renal disease: Secondary | ICD-10-CM | POA: Diagnosis not present

## 2014-08-20 DIAGNOSIS — N186 End stage renal disease: Secondary | ICD-10-CM | POA: Diagnosis not present

## 2014-08-20 DIAGNOSIS — Z992 Dependence on renal dialysis: Secondary | ICD-10-CM | POA: Diagnosis not present

## 2014-09-14 ENCOUNTER — Encounter (HOSPITAL_COMMUNITY): Payer: Self-pay | Admitting: *Deleted

## 2014-09-14 ENCOUNTER — Emergency Department (HOSPITAL_COMMUNITY)
Admission: EM | Admit: 2014-09-14 | Discharge: 2014-09-14 | Disposition: A | Discharge status: Home / Self Care | Payer: Medicare Other | Attending: Emergency Medicine | Admitting: Emergency Medicine

## 2014-09-14 ENCOUNTER — Emergency Department (HOSPITAL_COMMUNITY): Payer: Medicare Other

## 2014-09-14 DIAGNOSIS — J810 Acute pulmonary edema: Secondary | ICD-10-CM | POA: Diagnosis not present

## 2014-09-14 DIAGNOSIS — Z992 Dependence on renal dialysis: Secondary | ICD-10-CM | POA: Diagnosis not present

## 2014-09-14 DIAGNOSIS — Z8739 Personal history of other diseases of the musculoskeletal system and connective tissue: Secondary | ICD-10-CM | POA: Insufficient documentation

## 2014-09-14 DIAGNOSIS — K219 Gastro-esophageal reflux disease without esophagitis: Secondary | ICD-10-CM | POA: Diagnosis not present

## 2014-09-14 DIAGNOSIS — D649 Anemia, unspecified: Secondary | ICD-10-CM | POA: Diagnosis not present

## 2014-09-14 DIAGNOSIS — N186 End stage renal disease: Secondary | ICD-10-CM | POA: Insufficient documentation

## 2014-09-14 DIAGNOSIS — I120 Hypertensive chronic kidney disease with stage 5 chronic kidney disease or end stage renal disease: Secondary | ICD-10-CM | POA: Diagnosis not present

## 2014-09-14 DIAGNOSIS — Z79899 Other long term (current) drug therapy: Secondary | ICD-10-CM | POA: Diagnosis not present

## 2014-09-14 DIAGNOSIS — R0602 Shortness of breath: Secondary | ICD-10-CM | POA: Diagnosis not present

## 2014-09-14 DIAGNOSIS — I10 Essential (primary) hypertension: Secondary | ICD-10-CM | POA: Diagnosis not present

## 2014-09-14 LAB — BASIC METABOLIC PANEL
Anion gap: 16 — ABNORMAL HIGH (ref 5–15)
BUN: 33 mg/dL — ABNORMAL HIGH (ref 6–23)
CO2: 26 mmol/L (ref 19–32)
Calcium: 9.9 mg/dL (ref 8.4–10.5)
Chloride: 94 mmol/L — ABNORMAL LOW (ref 96–112)
Creatinine, Ser: 10.64 mg/dL — ABNORMAL HIGH (ref 0.50–1.10)
GFR calc Af Amer: 4 mL/min — ABNORMAL LOW (ref >90)
GFR calc non Af Amer: 3 mL/min — ABNORMAL LOW (ref >90)
Glucose, Bld: 127 mg/dL — ABNORMAL HIGH (ref 70–99)
Potassium: 4.3 mmol/L (ref 3.5–5.1)
Sodium: 136 mmol/L (ref 135–145)

## 2014-09-14 LAB — I-STAT TROPONIN, ED: Troponin i, poc: 0.02 ng/mL (ref 0.00–0.08)

## 2014-09-14 LAB — CBC
HCT: 36 % (ref 36.0–46.0)
Hemoglobin: 11.3 g/dL — ABNORMAL LOW (ref 12.0–15.0)
MCH: 27.7 pg (ref 26.0–34.0)
MCHC: 31.4 g/dL (ref 30.0–36.0)
MCV: 88.2 fL (ref 78.0–100.0)
Platelets: 198 10*3/uL (ref 150–400)
RBC: 4.08 MIL/uL (ref 3.87–5.11)
RDW: 18.9 % — ABNORMAL HIGH (ref 11.5–15.5)
WBC: 13 10*3/uL — ABNORMAL HIGH (ref 4.0–10.5)

## 2014-09-14 LAB — BRAIN NATRIURETIC PEPTIDE: B Natriuretic Peptide: 1454.5 pg/mL — ABNORMAL HIGH (ref 0.0–100.0)

## 2014-09-14 MED ORDER — ACETAMINOPHEN 325 MG PO TABS
650.0000 mg | ORAL_TABLET | Freq: Once | ORAL | Status: AC
Start: 2014-09-14 — End: 2014-09-14
  Administered 2014-09-14: 650 mg via ORAL

## 2014-09-14 MED ORDER — ACETAMINOPHEN 325 MG PO TABS
ORAL_TABLET | ORAL | Status: AC
  Filled 2014-09-14: qty 2

## 2014-09-14 NOTE — ED Provider Notes (Signed)
CSN: [id number]     Arrival date & time 09/14/14  0224 History   First MD Initiated Contact with Patient 09/14/14 0236     This chart was scribed for Julianne Rice, MD by Forrestine Him, ED Scribe. This patient was seen in room A04C/A04C and the patient's care was started 2:38 AM.   Chief Complaint  Patient presents with  . Shortness of Breath   HPI  HPI Comments: Alexa Dougherty is a 69 y.o. female with a PMHx of HTN, GERD, ESRD on HD M/W/F last dialysis was friday who presents to the Emergency Department complaining of constant, ongoing, gradually worsening shortness of breath onset 8 PM last night. SOB is exacerbated when flat and mildly alleviated when sitting up. Pt denies any current pain or other associated symptoms at this time. No fever, chills, or chest pain. No known allergies to medications. She has had mild non-productive cough. No lower ext swelling.   Past Medical History  Diagnosis Date  . Anemia   . Hypertension   . GERD (gastroesophageal reflux disease)   . Headache(784.0)   . Arthritis   . Chronic kidney disease   . Dialysis patient    Past Surgical History  Procedure Laterality Date  . Tubal ligation  1980  . Av fistula placement  02/02/2012    Procedure: ARTERIOVENOUS (AV) FISTULA CREATION;  Surgeon: Angelia Mould, MD;  Location: [place];  Service: Vascular;  Laterality: Left;  . Insertion of dialysis catheter  02/02/2012    Procedure: INSERTION OF DIALYSIS CATHETER;  Surgeon: Angelia Mould, MD;  Location: [facility];  Service: Vascular;  Laterality: Right;  Insertion of 23cm dialysis catheter in Right Internal Jugular   . Fistulogram N/A 04/01/2012    Procedure: FISTULOGRAM;  Surgeon: Angelia Mould, MD;  Location: [facility] CATH LAB;  Service: Cardiovascular;  Laterality: N/A;   Family History  Problem Relation Age of Onset  . Hypertension Mother   . Cancer Sister   . Cancer Brother   . Hypertension Daughter    History  Substance Use Topics  .  Smoking status: Never Smoker   . Smokeless tobacco: Never Used  . Alcohol Use: No   OB History    No data available     Review of Systems  Constitutional: Negative for fever and chills.  HENT: Negative for congestion, sinus pressure and sore throat.   Respiratory: Positive for cough and shortness of breath.   Cardiovascular: Negative for chest pain, palpitations and leg swelling.  Gastrointestinal: Negative for nausea, vomiting, abdominal pain and diarrhea.  Musculoskeletal: Negative for back pain and arthralgias.  Skin: Negative for rash.  Neurological: Negative for dizziness, weakness, light-headedness, numbness and headaches.  Psychiatric/Behavioral: Negative for confusion.  All other systems reviewed and are negative.     Allergies  Review of patient's allergies indicates no known allergies.  Home Medications   Prior to Admission medications   Medication Sig Start Date End Date Taking? Authorizing Provider  amLODipine (NORVASC) 5 MG tablet Take 5 mg by mouth daily.   Yes Historical Provider, MD  atorvastatin (LIPITOR) 10 MG tablet Take 10 mg by mouth daily.   Yes Historical Provider, MD  b complex-vitamin c-folic acid (NEPHRO-VITE) 0.8 MG TABS Take 0.8 mg by mouth daily.   Yes Historical Provider, MD  dexlansoprazole (DEXILANT) 60 MG capsule Take 60 mg by mouth daily.   Yes Historical Provider, MD  metoprolol succinate (TOPROL-XL) 50 MG 24 hr tablet Take 50 mg by mouth daily. 01/03/12  Yes Historical Provider, MD  RENVELA 800 MG tablet Take 1,600 mg by mouth 3 (three) times daily with meals.  01/15/12  Yes Historical Provider, MD   Triage Vitals: BP 197/102 mmHg  Pulse 90  Temp(Src) 98.4 F (36.9 C) (Oral)  Resp 25  Ht 5\' 7"  (1.702 m)  SpO2 98%   Physical Exam  Constitutional: She is oriented to person, place, and time. She appears well-developed and well-nourished. No distress.  HENT:  Head: Normocephalic and atraumatic.  Mouth/Throat: Oropharynx is clear and  moist. No oropharyngeal exudate.  Eyes: EOM are normal. Pupils are equal, round, and reactive to light.  Neck: Normal range of motion. Neck supple.  Cardiovascular: Normal rate and regular rhythm.   Pulmonary/Chest: Effort normal. No respiratory distress. She has no wheezes. She has rales.  Crackles bilateral bases up to mid lungs. Increased respiratory effort. Improved when sitting upright  Abdominal: Soft. Bowel sounds are normal.  Musculoskeletal: Normal range of motion. She exhibits no edema or tenderness.  No lower extremity swelling or pain. Patient with left antecubital dialysis fistula. Palpable thrill  Neurological: She is alert and oriented to person, place, and time.  Skin: Skin is warm and dry. No rash noted. No erythema.  Psychiatric: She has a normal mood and affect. Her behavior is normal.  Nursing note and vitals reviewed.   ED Course  Procedures (including critical care time)  DIAGNOSTIC STUDIES: Oxygen Saturation is 88% on RA, low by my interpretation.    COORDINATION OF CARE: 2:38 AM-Discussed treatment plan with pt at bedside and pt agreed to plan.     Labs Review Labs Reviewed  BASIC METABOLIC PANEL - Abnormal; Notable for the following:    Chloride 94 (*)    Glucose, Bld 127 (*)    BUN 33 (*)    Creatinine, Ser 10.64 (*)    GFR calc non Af Amer 3 (*)    GFR calc Af Amer 4 (*)    Anion gap 16 (*)    All other components within normal limits  CBC - Abnormal; Notable for the following:    WBC 13.0 (*)    Hemoglobin 11.3 (*)    RDW 18.9 (*)    All other components within normal limits  BRAIN NATRIURETIC PEPTIDE - Abnormal; Notable for the following:    B Natriuretic Peptide 1454.5 (*)    All other components within normal limits  I-STAT TROPOININ, ED    Imaging Review Dg Chest 2 View (if Patient Has Fever And/or Copd)  09/14/2014   CLINICAL DATA:  Acute onset of shortness of breath. Initial encounter.  EXAM: CHEST  2 VIEW  COMPARISON:  Chest  radiograph performed 02/02/2012  FINDINGS: The lungs are well-aerated. Vascular congestion is noted, with bilateral central airspace opacities, more prominent on the left. This may reflect asymmetric pulmonary edema or pneumonia. No pleural effusion or pneumothorax is seen.  The heart is mildly enlarged. No acute osseous abnormalities are seen.  IMPRESSION: Vascular congestion and mild cardiomegaly, with bilateral central airspace opacities, more prominent on the left. This may reflect asymmetric pulmonary edema or pneumonia.   Electronically Signed   By: Garald Balding M.D.   On: 09/14/2014 04:15     EKG Interpretation   Date/Time:  Monday September 14 2014 02:29:59 EDT Ventricular Rate:  97 PR Interval:  182 QRS Duration: 88 QT Interval:  358 QTC Calculation: [id number] R Axis:   56 Text Interpretation:  Normal sinus rhythm Normal ECG Confirmed by  Lita Mains  MD, DAVID ([id number]) on 09/14/2014 5:46:34 AM      MDM   Final diagnoses:  Acute pulmonary edema    I personally performed the services described in this documentation, which was scribed in my presence. The recorded information has been reviewed and is accurate.  Low suspicion for pneumonia. Patient's exam and history consistent with fluid overload. Discussed with Dr. Jonnie Finner. States he'll place dialysis orders from the emergency department. If patient's symptoms have improved she will be discharged otherwise will need admission.  Julianne Rice, MD 09/14/14 [phone]

## 2014-09-14 NOTE — ED Notes (Signed)
Pt 95% on 2L [place]. pts daughter states that pt get dialysis MWF. Has not missed any.

## 2014-09-14 NOTE — Procedures (Signed)
Patient presented to the Emergency Room with acute shortness of breath worsening overnight and after work up felt consistent with pulmonary congestion/edema from volume overload (no clinical indicators of PNA). When seen on HD she is laying flat and complains only of some nausea after intra-HD BP drop. She has not had any fever or cough and will be DC home after HD today as her acute problem/presentation complaint has resolved with emergent dialysis/UF.  BP 118/91 mmHg  Pulse 93  Temp(Src) 98.6 F (37 C) (Oral)  Resp 20  Ht 5\' 7"  (1.702 m)  Wt 78.8 kg (173 lb 11.6 oz)  BMI 27.20 kg/m2  SpO2 100%  CXR: IMPRESSION: Vascular congestion and mild cardiomegaly, with bilateral central airspace opacities, more prominent on the left. This may reflect asymmetric pulmonary edema or pneumonia  Patient seen on Hemodialysis. QB 400, UF goal 4.5L Treatment adjusted as needed.  Elmarie Shiley MD [facility]. Office # [phone] Pager # [phone] 10:18 AM

## 2014-09-14 NOTE — ED Notes (Signed)
Dialysis called for pt to be transported upstairs.

## 2014-09-14 NOTE — ED Notes (Signed)
Patient transported to X-ray 

## 2014-09-14 NOTE — ED Notes (Signed)
IV team at bedside 

## 2014-09-14 NOTE — ED Notes (Signed)
Pt c/o SOB since 8p last night. Also c/o dry cough. Pt 87% on RA.

## 2014-09-14 NOTE — Progress Notes (Signed)
Patient has completed her Hemodialysis treatment successfully. Patient is stable and has no verbalized concerns. Patient has been seen by Dr Posey Pronto and has been approved for discharge. Patients daughter is at the bedside and will be transferring the patient home.

## 2014-09-14 NOTE — ED Notes (Addendum)
Per Dr. Lita Mains - plan of care is to discharge pt from dialysis.

## 2014-09-19 DIAGNOSIS — Z992 Dependence on renal dialysis: Secondary | ICD-10-CM | POA: Diagnosis not present

## 2014-09-19 DIAGNOSIS — N186 End stage renal disease: Secondary | ICD-10-CM | POA: Diagnosis not present

## 2014-09-19 DIAGNOSIS — N039 Chronic nephritic syndrome with unspecified morphologic changes: Secondary | ICD-10-CM | POA: Diagnosis not present

## 2014-10-20 DIAGNOSIS — N186 End stage renal disease: Secondary | ICD-10-CM | POA: Diagnosis not present

## 2014-10-20 DIAGNOSIS — Z992 Dependence on renal dialysis: Secondary | ICD-10-CM | POA: Diagnosis not present

## 2014-10-20 DIAGNOSIS — N039 Chronic nephritic syndrome with unspecified morphologic changes: Secondary | ICD-10-CM | POA: Diagnosis not present

## 2014-11-25 ENCOUNTER — Encounter: Payer: Self-pay | Admitting: Physician Assistant

## 2014-12-08 ENCOUNTER — Ambulatory Visit (INDEPENDENT_AMBULATORY_CARE_PROVIDER_SITE_OTHER): Payer: Medicare Other | Admitting: Physician Assistant

## 2014-12-08 ENCOUNTER — Other Ambulatory Visit (INDEPENDENT_AMBULATORY_CARE_PROVIDER_SITE_OTHER): Payer: Medicare Other

## 2014-12-08 ENCOUNTER — Encounter: Payer: Self-pay | Admitting: Physician Assistant

## 2014-12-08 VITALS — BP 116/80 | HR 80 | Ht 59.84 in | Wt 164.1 lb

## 2014-12-08 DIAGNOSIS — R195 Other fecal abnormalities: Secondary | ICD-10-CM | POA: Diagnosis not present

## 2014-12-08 DIAGNOSIS — D649 Anemia, unspecified: Secondary | ICD-10-CM

## 2014-12-08 DIAGNOSIS — Z860100 Personal history of colon polyps, unspecified: Secondary | ICD-10-CM

## 2014-12-08 DIAGNOSIS — K219 Gastro-esophageal reflux disease without esophagitis: Secondary | ICD-10-CM | POA: Diagnosis not present

## 2014-12-08 LAB — CBC WITH DIFFERENTIAL/PLATELET
Basophils Absolute: 0 10*3/uL (ref 0.0–0.1)
Basophils Relative: 0.8 % (ref 0.0–3.0)
Eosinophils Absolute: 0.2 10*3/uL (ref 0.0–0.7)
Eosinophils Relative: 3.5 % (ref 0.0–5.0)
HCT: 32.1 % — ABNORMAL LOW (ref 36.0–46.0)
Hemoglobin: 10.1 g/dL — ABNORMAL LOW (ref 12.0–15.0)
Lymphocytes Relative: 27.9 % (ref 12.0–46.0)
Lymphs Abs: 1.5 10*3/uL (ref 0.7–4.0)
MCHC: 31.5 g/dL (ref 30.0–36.0)
MCV: 92.2 fl (ref 78.0–100.0)
Monocytes Absolute: 0.5 10*3/uL (ref 0.1–1.0)
Monocytes Relative: 9.7 % (ref 3.0–12.0)
Neutro Abs: 3.2 10*3/uL (ref 1.4–7.7)
Neutrophils Relative %: 58.1 % (ref 43.0–77.0)
Platelets: 258 10*3/uL (ref 150.0–400.0)
RBC: 3.48 Mil/uL — ABNORMAL LOW (ref 3.87–5.11)
RDW: 21.7 % — ABNORMAL HIGH (ref 11.5–15.5)
WBC: 5.5 10*3/uL (ref 4.0–10.5)

## 2014-12-08 LAB — IBC PANEL
Iron: 51 ug/dL (ref 42–145)
Saturation Ratios: 17.9 % — ABNORMAL LOW (ref 20.0–50.0)
Transferrin: 203 mg/dL — ABNORMAL LOW (ref 212.0–360.0)

## 2014-12-08 LAB — FERRITIN: Ferritin: 836.9 ng/mL — ABNORMAL HIGH (ref 10.0–291.0)

## 2014-12-08 NOTE — Progress Notes (Signed)
Patient ID: Alexa Dougherty, female   DOB: 08-18-1945, 69 y.o.   MRN: [id number]    HPI:  Alexa Dougherty is a 69 y.o.   female  referred by Ernest Haber, PA-C for evaluation of heme positive stools and anemia. Patient has a history of hypertension, GERD, headaches, arthritis, chronic kidney disease for which she is on dialysis Monday Wednesday and Fridays, she is status post a bilateral tubal ligation and placement of an AV fistula for dialysis in her left arm. She has been receiving dialysis since September 2013. She frequently has laboratory studies at dialysis and was recently found to have a hemoglobin of 7. She was then sent stool cards to complete an all cards came back positive. She reports that she had a colonoscopy for 5 years ago by Dr. Benson Norway. She states she had a polyp removed and was advised to have surveillance in 3 years but has not yet done so. She has had no change in her bowel habits or stool caliber and has not had any bloody or tarry stools. Her appetite is good and she has had no abnormal weight loss. She does have a long-standing history of reflux and years ago saw GI for epigastric pain. She states that she had an upper endoscopy in 1996 or 1997. She does not remember where she had this done or who performed the procedure but she  states she was told she had reflux and esophageal spasm. She has been on DEXA lanced since 2012. She has a brother and sister who died of cancer in lower dose but she is not sure what type of cancer. She has no shortness of breath or chest pain but has been feeling more fatigued than normal.   Past Medical History  Diagnosis Date  . Anemia   . Hypertension   . GERD (gastroesophageal reflux disease)   . Headache(784.0)   . Arthritis   . Chronic kidney disease   . Dialysis patient   . Colon polyps   . Hyperlipemia     Past Surgical History  Procedure Laterality Date  . Tubal ligation  1980  . Av fistula placement  02/02/2012    Procedure:  ARTERIOVENOUS (AV) FISTULA CREATION;  Surgeon: Angelia Mould, MD;  Location: [place];  Service: Vascular;  Laterality: Left;  . Insertion of dialysis catheter  02/02/2012    Procedure: INSERTION OF DIALYSIS CATHETER;  Surgeon: Angelia Mould, MD;  Location: [place];  Service: Vascular;  Laterality: Right;  Insertion of 23cm dialysis catheter in Right Internal Jugular   . Fistulogram N/A 04/01/2012    Procedure: FISTULOGRAM;  Surgeon: Angelia Mould, MD;  Location: [facility] CATH LAB;  Service: Cardiovascular;  Laterality: N/A;   Family History  Problem Relation Age of Onset  . Hypertension Mother   . Cancer Sister     type unknown  . Cancer Brother     type unknown  . Hypertension Daughter    History  Substance Use Topics  . Smoking status: Never Smoker   . Smokeless tobacco: Never Used  . Alcohol Use: No   Current Outpatient Prescriptions  Medication Sig Dispense Refill  . amLODipine (NORVASC) 10 MG tablet Take 1 tablet by mouth daily.    Marland Kitchen atorvastatin (LIPITOR) 10 MG tablet Take 10 mg by mouth daily.    Marland Kitchen b complex-vitamin c-folic acid (NEPHRO-VITE) 0.8 MG TABS Take 0.8 mg by mouth daily.    Marland Kitchen dexlansoprazole (DEXILANT) 60 MG capsule Take 60 mg by mouth daily.    Marland Kitchen  metoprolol succinate (TOPROL-XL) 50 MG 24 hr tablet Take 50 mg by mouth daily.    Marland Kitchen RENVELA 800 MG tablet Take 1,600 mg by mouth 3 (three) times daily with meals.      No current facility-administered medications for this visit.   No Known Allergies   Review of Systems: Gen: Denies any fever, chills, sweats, anorexia, , weakness, malaise, weight loss, and sleep disorder CV: Denies chest pain, angina, palpitations, syncope, orthopnea, PND, peripheral edema, and claudication. Resp: Denies dyspnea at rest, dyspnea with exercise, cough, sputum, wheezing, coughing up blood, and pleurisy. GI: Denies vomiting blood, jaundice, and fecal incontinence.   Denies dysphagia or odynophagia. GU : Denies urinary  burning, blood in urine, urinary frequency, urinary hesitancy, nocturnal urination, and urinary incontinence. MS: Denies joint pain, limitation of movement, and swelling, stiffness, low back pain, extremity pain. Denies muscle weakness, cramps, atrophy.  Derm: Denies rash, itching, dry skin, hives, moles, warts, or unhealing ulcers.  Psych: Denies depression, anxiety, memory loss, suicidal ideation, hallucinations, paranoia, and confusion. Heme: Denies bruising, bleeding, and enlarged lymph nodes. Neuro:  Denies any headaches, dizziness, paresthesias. Endo:  Denies any problems with DM, thyroid, adrenal function     LAB RESULTS: Laboratory studies from [place], [place] reveal positive stool cards on 11/25/2014 Blood work at that facility on July 4 had a hemoglobin of 7.2 and hematocrit 21. Hemoglobin July 6 7.4. Hemoglobin July 8 7.4, Hemoglobin July 13 8.1  CBC from 09/14/2014 at a white blood count 13, hemoglobin 11.3, hematocrit 36, platelets 198,000, MCV 88.2.  Physical Exam: BP 116/80 mmHg  Pulse 80  Ht 4' 11.84" (1.52 m)  Wt 164 lb 2 oz (74.447 kg)  BMI 32.22 kg/m2 Constitutional: Pleasant,well-developed female in no acute distress. HEENT: Normocephalic and atraumatic. Conjunctivae are normal. No scleral icterus. Neck supple.  Cardiovascular: Normal rate, regular rhythm.  Pulmonary/chest: Effort normal and breath sounds normal. No wheezing, rales or rhonchi. Abdominal: Soft, nondistended, nontender. Bowel sounds active throughout. There are no masses palpable. No hepatomegaly. Extremities: no edema. Left antecubital dialysis fistula with palpable thrill. Lymphadenopathy: No cervical adenopathy noted. Neurological: Alert and oriented to person place and time. Skin: Skin is warm and dry. No rashes noted. Psychiatric: Normal mood and affect. Behavior is normal.  ASSESSMENT AND PLAN: 69 year old female with a history of chronic kidney disease found to have a drop in  her hemoglobin with heme-positive stools. Patient has a history of colon polyps and is overdue for surveillance colonoscopy. She also has a known history of reflux. A repeat CBC, iron studies and ferritin will be obtained today. She will be scheduled for a colonoscopy to evaluate for recurrent polyps or neoplasia as well as AVMs as well as an upper endoscopy to evaluate for possible gastritis or ulcer as a source of her anemia and heme positive stools.The risks, benefits, and alternatives to colonoscopy with possible biopsy and possible polypectomy were discussed with the patient and they consent to proceed. The risks, benefits, and alternatives to endoscopy with possible biopsy and possible dilation were discussed with the patient and they consent to proceed. The procedures will be scheduled with Dr. Carlean Purl. Further recommendations will be made pending the findings of the above.    Hvozdovic, Deloris Ping 12/08/2014, 4:48 PM  CC: Ernest Haber, PA-C

## 2014-12-08 NOTE — Patient Instructions (Signed)
You have been scheduled for an endoscopy and colonoscopy. Please follow the written instructions given to you at your visit today. Please pick up your prep supplies at the pharmacy within the next 1-3 days. If you use inhalers (even only as needed), please bring them with you on the day of your procedure.  Your physician has requested that you go to the basement for lab work before leaving today.

## 2014-12-10 ENCOUNTER — Encounter: Payer: Self-pay | Admitting: *Deleted

## 2014-12-14 NOTE — Progress Notes (Signed)
Agree w/ Alexa Dougherty's note and mangement.  

## 2014-12-23 ENCOUNTER — Encounter (HOSPITAL_COMMUNITY): Payer: Self-pay | Admitting: *Deleted

## 2014-12-28 NOTE — Anesthesia Preprocedure Evaluation (Signed)
Anesthesia Evaluation  Patient identified by MRN, date of birth, ID band Patient awake    Reviewed: Allergy & Precautions, NPO status , Patient's Chart, lab work & pertinent test results  History of Anesthesia Complications Negative for: history of anesthetic complications  Airway Mallampati: II  TM Distance: >3 FB Neck ROM: Full    Dental no notable dental hx. (+) Dental Advisory Given   Pulmonary neg pulmonary ROS,  breath sounds clear to auscultation  Pulmonary exam normal       Cardiovascular hypertension, Pt. on medications Normal cardiovascular examRhythm:Regular Rate:Normal     Neuro/Psych  Headaches, negative psych ROS   GI/Hepatic Neg liver ROS, GERD-  Medicated and Controlled,  Endo/Other  negative endocrine ROS  Renal/GU Dialysis and CRFRenal diseaseMWF dialysis  negative genitourinary   Musculoskeletal  (+) Arthritis -,   Abdominal   Peds negative pediatric ROS (+)  Hematology negative hematology ROS (+)   Anesthesia Other Findings   Reproductive/Obstetrics negative OB ROS                             Anesthesia Physical Anesthesia Plan  ASA: III  Anesthesia Plan: MAC   Post-op Pain Management:    Induction:   Airway Management Planned: Nasal Cannula  Additional Equipment:   Intra-op Plan:   Post-operative Plan:   Informed Consent: I have reviewed the patients History and Physical, chart, labs and discussed the procedure including the risks, benefits and alternatives for the proposed anesthesia with the patient or authorized representative who has indicated his/her understanding and acceptance.   Dental advisory given  Plan Discussed with: CRNA  Anesthesia Plan Comments:         Anesthesia Quick Evaluation

## 2014-12-29 ENCOUNTER — Ambulatory Visit (HOSPITAL_COMMUNITY)
Admission: RE | Admit: 2014-12-29 | Discharge: 2014-12-29 | Disposition: A | Discharge status: Home / Self Care | Payer: Medicare Other | Source: Ambulatory Visit | Attending: Internal Medicine | Admitting: Internal Medicine

## 2014-12-29 ENCOUNTER — Encounter (HOSPITAL_COMMUNITY): Payer: Self-pay

## 2014-12-29 ENCOUNTER — Ambulatory Visit (HOSPITAL_COMMUNITY): Payer: Medicare Other | Admitting: Anesthesiology

## 2014-12-29 ENCOUNTER — Encounter (HOSPITAL_COMMUNITY)
Admission: RE | Disposition: A | Discharge status: Home / Self Care | Payer: Self-pay | Source: Ambulatory Visit | Attending: Internal Medicine

## 2014-12-29 DIAGNOSIS — K635 Polyp of colon: Secondary | ICD-10-CM | POA: Diagnosis not present

## 2014-12-29 DIAGNOSIS — D649 Anemia, unspecified: Secondary | ICD-10-CM | POA: Diagnosis not present

## 2014-12-29 DIAGNOSIS — K2970 Gastritis, unspecified, without bleeding: Secondary | ICD-10-CM | POA: Diagnosis not present

## 2014-12-29 DIAGNOSIS — K5790 Diverticulosis of intestine, part unspecified, without perforation or abscess without bleeding: Secondary | ICD-10-CM | POA: Diagnosis not present

## 2014-12-29 DIAGNOSIS — D120 Benign neoplasm of cecum: Secondary | ICD-10-CM | POA: Diagnosis present

## 2014-12-29 DIAGNOSIS — K219 Gastro-esophageal reflux disease without esophagitis: Secondary | ICD-10-CM

## 2014-12-29 DIAGNOSIS — D125 Benign neoplasm of sigmoid colon: Secondary | ICD-10-CM | POA: Diagnosis not present

## 2014-12-29 DIAGNOSIS — I129 Hypertensive chronic kidney disease with stage 1 through stage 4 chronic kidney disease, or unspecified chronic kidney disease: Secondary | ICD-10-CM | POA: Diagnosis not present

## 2014-12-29 DIAGNOSIS — D124 Benign neoplasm of descending colon: Secondary | ICD-10-CM | POA: Diagnosis not present

## 2014-12-29 DIAGNOSIS — N189 Chronic kidney disease, unspecified: Secondary | ICD-10-CM | POA: Insufficient documentation

## 2014-12-29 DIAGNOSIS — Z992 Dependence on renal dialysis: Secondary | ICD-10-CM | POA: Insufficient documentation

## 2014-12-29 DIAGNOSIS — D122 Benign neoplasm of ascending colon: Secondary | ICD-10-CM | POA: Diagnosis not present

## 2014-12-29 DIAGNOSIS — R51 Headache: Secondary | ICD-10-CM | POA: Diagnosis not present

## 2014-12-29 DIAGNOSIS — Z79899 Other long term (current) drug therapy: Secondary | ICD-10-CM | POA: Insufficient documentation

## 2014-12-29 DIAGNOSIS — R195 Other fecal abnormalities: Secondary | ICD-10-CM | POA: Diagnosis present

## 2014-12-29 DIAGNOSIS — D123 Benign neoplasm of transverse colon: Secondary | ICD-10-CM | POA: Diagnosis present

## 2014-12-29 DIAGNOSIS — E785 Hyperlipidemia, unspecified: Secondary | ICD-10-CM | POA: Insufficient documentation

## 2014-12-29 DIAGNOSIS — K2951 Unspecified chronic gastritis with bleeding: Secondary | ICD-10-CM | POA: Insufficient documentation

## 2014-12-29 DIAGNOSIS — K2990 Gastroduodenitis, unspecified, without bleeding: Secondary | ICD-10-CM | POA: Diagnosis not present

## 2014-12-29 DIAGNOSIS — Z860100 Personal history of colon polyps, unspecified: Secondary | ICD-10-CM

## 2014-12-29 DIAGNOSIS — K648 Other hemorrhoids: Secondary | ICD-10-CM | POA: Insufficient documentation

## 2014-12-29 HISTORY — PX: COLONOSCOPY WITH PROPOFOL: SHX5780

## 2014-12-29 HISTORY — PX: ESOPHAGOGASTRODUODENOSCOPY (EGD) WITH PROPOFOL: SHX5813

## 2014-12-29 HISTORY — DX: Pure hypercholesterolemia, unspecified: E78.00

## 2014-12-29 HISTORY — DX: Gastritis, unspecified, without bleeding: K29.70

## 2014-12-29 SURGERY — ESOPHAGOGASTRODUODENOSCOPY (EGD) WITH PROPOFOL
Anesthesia: Monitor Anesthesia Care

## 2014-12-29 MED ORDER — ONDANSETRON HCL 4 MG/2ML IJ SOLN: 4.0000 mg | Freq: Once | INTRAMUSCULAR | Status: DC | PRN

## 2014-12-29 MED ORDER — FENTANYL CITRATE (PF) 100 MCG/2ML IJ SOLN: 25.0000 ug | INTRAMUSCULAR | Status: DC | PRN

## 2014-12-29 MED ORDER — PROPOFOL 10 MG/ML IV BOLUS
INTRAVENOUS | Status: AC
  Filled 2014-12-29: qty 20

## 2014-12-29 MED ORDER — SODIUM CHLORIDE 0.9 % IV SOLN
INTRAVENOUS | Status: DC
  Administered 2014-12-29: 500 mL via INTRAVENOUS

## 2014-12-29 MED ORDER — PROPOFOL INFUSION 10 MG/ML OPTIME
INTRAVENOUS | Status: DC | PRN
  Administered 2014-12-29: 300 ug/kg/min via INTRAVENOUS

## 2014-12-29 SURGICAL SUPPLY — 25 items

## 2014-12-29 NOTE — Op Note (Signed)
[facility] [place], [address]   COLONOSCOPY PROCEDURE REPORT  PATIENT: Alexa Dougherty, Alexa Dougherty  MR#: [id number] BIRTHDATE: [birth year] , 69  yrs. old GENDER: female ENDOSCOPIST: Gatha Mayer, MD, [facility] PROCEDURE DATE:  12/29/2014 PROCEDURE:   Colonoscopy, diagnostic, Colonoscopy with biopsy, and Colonoscopy with snare polypectomy First Screening Colonoscopy - Avg.  risk and is 50 yrs.  old or older - No.  Prior Negative Screening - Now for repeat screening. N/A  History of Adenoma - Now for follow-up colonoscopy & has been > or = to 3 yrs.  Yes hx of adenoma.  Has been 3 or more years since last colonoscopy.  Polyps removed today? Yes ASA CLASS:   Class III INDICATIONS:Evaluation of unexplained GI bleeding, Patient is not applicable for Colorectal Neoplasm Risk Assessment for this procedure, and heme + anemia. MEDICATIONS: Monitored anesthesia care, Residual sedation present, and Per Anesthesia  DESCRIPTION OF PROCEDURE:   After the risks benefits and alternatives of the procedure were thoroughly explained, informed consent was obtained.  The digital rectal exam revealed no abnormalities of the rectum.   The Pentax Ped Colon [id number] endoscope was introduced through the anus and advanced to the cecum, which was identified by both the appendix and ileocecal valve. No adverse events experienced.   The quality of the prep was good.  (MiraLax was used)  The instrument was then slowly withdrawn as the colon was fully examined. Estimated blood loss is zero unless otherwise noted in this procedure report.      COLON FINDINGS: 1)8 polyps removed 2-7 mm size.  cecum, transverse (3) descending (2) and sigmoid all removed cold snare and sent to patholgy.  ascending polyp removed cold biopsy. 2) Pan-diverticulosis 3) Internal hemorrhoids 4) Otherwise normal colon and rectum - good prep.  Retroflexed views revealed internal hemorrhoids. The time to cecum = 5.4  Withdrawal time = 17.3   The scope was withdrawn and the procedure completed. COMPLICATIONS: There were no immediate complications.  ENDOSCOPIC IMPRESSION: 1)8 polyps removed 2-7 mm size.  cecum, transverse (3) descending (2) and sigmoid all removed cold snare and sent to patholgy. ascending polyp removed cold biopsy. 2) Pan-diverticulosis 3) Internal hemorrhoids 4) Otherwise normal colon and rectum - good prep  RECOMMENDATIONS: 1.  Await pathology results 2.  Continue current medications  - no further GI w/u  eSigned:  Gatha Mayer, MD, [facility] 12/29/2014 9:41 AM   cc: Ernest Haber PA-C and The Patient   PATIENT NAME:  [name], [name] MR#: [id number]

## 2014-12-29 NOTE — Transfer of Care (Signed)
Immediate Anesthesia Transfer of Care Note  Patient: Alexa Dougherty  Procedure(s) Performed: Procedure(s): ESOPHAGOGASTRODUODENOSCOPY (EGD) WITH PROPOFOL (N/A) COLONOSCOPY WITH PROPOFOL (N/A)  Patient Location: PACU and Endoscopy Unit  Anesthesia Type:MAC  Level of Consciousness: awake and patient cooperative  Airway & Oxygen Therapy: Patient Spontanous Breathing and Patient connected to nasal cannula oxygen  Post-op Assessment: Report given to RN and Post -op Vital signs reviewed and stable  Post vital signs: Reviewed and stable  Last Vitals:  Filed Vitals:   12/29/14 0920  BP: 137/59  Pulse: 61  Temp:   Resp: 18    Complications: No apparent anesthesia complications

## 2014-12-29 NOTE — Anesthesia Postprocedure Evaluation (Signed)
  Anesthesia Post-op Note  Patient: Alexa Dougherty  Procedure(s) Performed: Procedure(s) (LRB): ESOPHAGOGASTRODUODENOSCOPY (EGD) WITH PROPOFOL (N/A) COLONOSCOPY WITH PROPOFOL (N/A)  Patient Location: PACU  Anesthesia Type: MAC  Level of Consciousness: awake and alert   Airway and Oxygen Therapy: Patient Spontanous Breathing  Post-op Pain: mild  Post-op Assessment: Post-op Vital signs reviewed, Patient's Cardiovascular Status Stable, Respiratory Function Stable, Patent Airway and No signs of Nausea or vomiting  Last Vitals:  Filed Vitals:   12/29/14 1000  BP: 163/76  Pulse: 63  Temp:   Resp: 16    Post-op Vital Signs: stable   Complications: No apparent anesthesia complications

## 2014-12-29 NOTE — Op Note (Signed)
[facility] [place], [address]   ENDOSCOPY PROCEDURE REPORT  PATIENT: Alexa Dougherty, Alexa Dougherty  MR#: [id number] BIRTHDATE: 01/17/1946 , 69  yrs. old GENDER: female ENDOSCOPIST: Gatha Mayer, MD, [facility] PROCEDURE DATE:  12/29/2014 PROCEDURE:  EGD w/ biopsy ASA CLASS:     Class III INDICATIONS:  heme + anemia. MEDICATIONS: Monitored anesthesia care and Per Anesthesia TOPICAL ANESTHETIC: none  DESCRIPTION OF PROCEDURE: After the risks benefits and alternatives of the procedure were thoroughly explained, informed consent was obtained.  The Pentax Gastroscope [id number] endoscope was introduced through the mouth and advanced to the second portion of the duodenum , Without limitations.  The instrument was slowly withdrawn as the mucosa was fully examined.    Antral gastritis - streaks of submocosal heme and erythema, no erosions.  otherwise normal.  Gastritis biopsied.  Retroflexed views revealed no abnormalities.     The scope was then withdrawn from the patient and the procedure completed.  COMPLICATIONS: There were no immediate complications.  ENDOSCOPIC IMPRESSION: Antral gastritis - streaks of submocosal heme and erythema, no erosions.  otherwise normal.  Gastritis biopsied  RECOMMENDATIONS: 1.  Await pathology results 2.  Proceed with a Colonoscopy.  REPEAT EXAM:  eSigned:  Gatha Mayer, MD, [facility] 12/29/2014 9:36 AM    CC:The Patient and Ernest Haber PA-C

## 2014-12-29 NOTE — Interval H&P Note (Signed)
History and Physical Interval Note:  12/29/2014 8:34 AM  Alexa Dougherty  has presented today for surgery, with the diagnosis of heme positive stools, anemia, hx of colon polyps, GERD.   The various methods of treatment have been discussed with the patient and family. After consideration of risks, benefits and other options for treatment, the patient has consented to  Procedure(s): ESOPHAGOGASTRODUODENOSCOPY (EGD) WITH PROPOFOL (N/A) COLONOSCOPY WITH PROPOFOL (N/A) as a surgical intervention .  The patient's history has been reviewed, patient examined, no change in status, stable for surgery.  I have reviewed the patient's chart and labs.  Questions were answered to the patient's satisfaction.     Silvano Rusk

## 2014-12-29 NOTE — H&P (View-Only) (Signed)
Patient ID: Alexa Dougherty, female   DOB: 05/13/1946, 69 y.o.   MRN: [id number]    HPI:  Alexa Dougherty is a 69 y.o.   female  referred by Ernest Haber, PA-C for evaluation of heme positive stools and anemia. Patient has a history of hypertension, GERD, headaches, arthritis, chronic kidney disease for which she is on dialysis Monday Wednesday and Fridays, she is status post a bilateral tubal ligation and placement of an AV fistula for dialysis in her left arm. She has been receiving dialysis since September 2013. She frequently has laboratory studies at dialysis and was recently found to have a hemoglobin of 7. She was then sent stool cards to complete an all cards came back positive. She reports that she had a colonoscopy for 5 years ago by Dr. Benson Norway. She states she had a polyp removed and was advised to have surveillance in 3 years but has not yet done so. She has had no change in her bowel habits or stool caliber and has not had any bloody or tarry stools. Her appetite is good and she has had no abnormal weight loss. She does have a long-standing history of reflux and years ago saw GI for epigastric pain. She states that she had an upper endoscopy in 1996 or 1997. She does not remember where she had this done or who performed the procedure but she  states she was told she had reflux and esophageal spasm. She has been on DEXA lanced since 2012. She has a brother and sister who died of cancer in lower dose but she is not sure what type of cancer. She has no shortness of breath or chest pain but has been feeling more fatigued than normal.   Past Medical History  Diagnosis Date  . Anemia   . Hypertension   . GERD (gastroesophageal reflux disease)   . Headache(784.0)   . Arthritis   . Chronic kidney disease   . Dialysis patient   . Colon polyps   . Hyperlipemia     Past Surgical History  Procedure Laterality Date  . Tubal ligation  1980  . Av fistula placement  02/02/2012    Procedure:  ARTERIOVENOUS (AV) FISTULA CREATION;  Surgeon: Angelia Mould, MD;  Location: [facility];  Service: Vascular;  Laterality: Left;  . Insertion of dialysis catheter  02/02/2012    Procedure: INSERTION OF DIALYSIS CATHETER;  Surgeon: Angelia Mould, MD;  Location: [facility];  Service: Vascular;  Laterality: Right;  Insertion of 23cm dialysis catheter in Right Internal Jugular   . Fistulogram N/A 04/01/2012    Procedure: FISTULOGRAM;  Surgeon: Angelia Mould, MD;  Location: [facility] CATH LAB;  Service: Cardiovascular;  Laterality: N/A;   Family History  Problem Relation Age of Onset  . Hypertension Mother   . Cancer Sister     type unknown  . Cancer Brother     type unknown  . Hypertension Daughter    History  Substance Use Topics  . Smoking status: Never Smoker   . Smokeless tobacco: Never Used  . Alcohol Use: No   Current Outpatient Prescriptions  Medication Sig Dispense Refill  . amLODipine (NORVASC) 10 MG tablet Take 1 tablet by mouth daily.    Marland Kitchen atorvastatin (LIPITOR) 10 MG tablet Take 10 mg by mouth daily.    Marland Kitchen b complex-vitamin c-folic acid (NEPHRO-VITE) 0.8 MG TABS Take 0.8 mg by mouth daily.    Marland Kitchen dexlansoprazole (DEXILANT) 60 MG capsule Take 60 mg by mouth daily.    Marland Kitchen  metoprolol succinate (TOPROL-XL) 50 MG 24 hr tablet Take 50 mg by mouth daily.    Marland Kitchen RENVELA 800 MG tablet Take 1,600 mg by mouth 3 (three) times daily with meals.      No current facility-administered medications for this visit.   No Known Allergies   Review of Systems: Gen: Denies any fever, chills, sweats, anorexia, , weakness, malaise, weight loss, and sleep disorder CV: Denies chest pain, angina, palpitations, syncope, orthopnea, PND, peripheral edema, and claudication. Resp: Denies dyspnea at rest, dyspnea with exercise, cough, sputum, wheezing, coughing up blood, and pleurisy. GI: Denies vomiting blood, jaundice, and fecal incontinence.   Denies dysphagia or odynophagia. GU : Denies urinary  burning, blood in urine, urinary frequency, urinary hesitancy, nocturnal urination, and urinary incontinence. MS: Denies joint pain, limitation of movement, and swelling, stiffness, low back pain, extremity pain. Denies muscle weakness, cramps, atrophy.  Derm: Denies rash, itching, dry skin, hives, moles, warts, or unhealing ulcers.  Psych: Denies depression, anxiety, memory loss, suicidal ideation, hallucinations, paranoia, and confusion. Heme: Denies bruising, bleeding, and enlarged lymph nodes. Neuro:  Denies any headaches, dizziness, paresthesias. Endo:  Denies any problems with DM, thyroid, adrenal function     LAB RESULTS: Laboratory studies from [place], [place] reveal positive stool cards on 11/25/2014 Blood work at that facility on July 4 had a hemoglobin of 7.2 and hematocrit 21. Hemoglobin July 6 7.4. Hemoglobin July 8 7.4, Hemoglobin July 13 8.1  CBC from 09/14/2014 at a white blood count 13, hemoglobin 11.3, hematocrit 36, platelets 198,000, MCV 88.2.  Physical Exam: BP 116/80 mmHg  Pulse 80  Ht 4' 11.84" (1.52 m)  Wt 164 lb 2 oz (74.447 kg)  BMI 32.22 kg/m2 Constitutional: Pleasant,well-developed female in no acute distress. HEENT: Normocephalic and atraumatic. Conjunctivae are normal. No scleral icterus. Neck supple.  Cardiovascular: Normal rate, regular rhythm.  Pulmonary/chest: Effort normal and breath sounds normal. No wheezing, rales or rhonchi. Abdominal: Soft, nondistended, nontender. Bowel sounds active throughout. There are no masses palpable. No hepatomegaly. Extremities: no edema. Left antecubital dialysis fistula with palpable thrill. Lymphadenopathy: No cervical adenopathy noted. Neurological: Alert and oriented to person place and time. Skin: Skin is warm and dry. No rashes noted. Psychiatric: Normal mood and affect. Behavior is normal.  ASSESSMENT AND PLAN: 69 year old female with a history of chronic kidney disease found to have a drop in  her hemoglobin with heme-positive stools. Patient has a history of colon polyps and is overdue for surveillance colonoscopy. She also has a known history of reflux. A repeat CBC, iron studies and ferritin will be obtained today. She will be scheduled for a colonoscopy to evaluate for recurrent polyps or neoplasia as well as AVMs as well as an upper endoscopy to evaluate for possible gastritis or ulcer as a source of her anemia and heme positive stools.The risks, benefits, and alternatives to colonoscopy with possible biopsy and possible polypectomy were discussed with the patient and they consent to proceed. The risks, benefits, and alternatives to endoscopy with possible biopsy and possible dilation were discussed with the patient and they consent to proceed. The procedures will be scheduled with Dr. Carlean Purl. Further recommendations will be made pending the findings of the above.    Hvozdovic, Deloris Ping 12/08/2014, 4:48 PM  CC: Ernest Haber, PA-C

## 2014-12-29 NOTE — Discharge Instructions (Signed)
° °  The stomach lining was irritated and inflamed = gastritis. I took biopsies to understand better.  I found and removed 8 small polyps - nothing looks like cancer - all sent to pathology.  I will let you know results and plans.  I appreciate the opportunity to care for you. Gatha Mayer, MD, FACG  YOU HAD AN ENDOSCOPIC PROCEDURE TODAY: Refer to the procedure report and other information in the discharge instructions given to you for any specific questions about what was found during the examination. If this information does not answer your questions, please call Dr. Celesta Aver office at [phone] to clarify.   YOU SHOULD EXPECT: Some feelings of bloating in the abdomen. Passage of more gas than usual. Walking can help get rid of the air that was put into your GI tract during the procedure and reduce the bloating. If you had a lower endoscopy (such as a colonoscopy or flexible sigmoidoscopy) you may notice spotting of blood in your stool or on the toilet paper. Some abdominal soreness may be present for a day or two, also.  DIET: Your first meal following the procedure should be a light meal and then it is ok to progress to your normal diet. A half-sandwich or bowl of soup is an example of a good first meal. Heavy or fried foods are harder to digest and may make you feel nauseous or bloated. Drink plenty of fluids but you should avoid alcoholic beverages for 24 hours.   ACTIVITY: Your care partner should take you home directly after the procedure. You should plan to take it easy, moving slowly for the rest of the day. You can resume normal activity the day after the procedure however YOU SHOULD NOT DRIVE, use power tools, machinery or perform tasks that involve climbing or major physical exertion for 24 hours (because of the sedation medicines used during the test).   SYMPTOMS TO REPORT IMMEDIATELY: A gastroenterologist can be reached at any hour. Please call [phone]  for any of the  following symptoms:  Following lower endoscopy (colonoscopy, flexible sigmoidoscopy) Excessive amounts of blood in the stool  Significant tenderness, worsening of abdominal pains  Swelling of the abdomen that is new, acute  Fever of 100 or higher  Following upper endoscopy (EGD, EUS, ERCP, esophageal dilation) Vomiting of blood or coffee ground material  New, significant abdominal pain  New, significant chest pain or pain under the shoulder blades  Painful or persistently difficult swallowing  New shortness of breath  Black, tarry-looking or red, bloody stools  FOLLOW UP:  If any biopsies were taken you will be contacted by phone or by letter within the next 1-3 weeks. Call [phone]  if you have not heard about the biopsies in 3 weeks.  Please also call with any specific questions about appointments or follow up tests.

## 2014-12-30 LAB — POCT I-STAT 4, (NA,K, GLUC, HGB,HCT)
Glucose, Bld: 103 mg/dL — ABNORMAL HIGH (ref 65–99)
HCT: 46 % (ref 36.0–46.0)
Hemoglobin: 15.6 g/dL — ABNORMAL HIGH (ref 12.0–15.0)
Potassium: 4.4 mmol/L (ref 3.5–5.1)
Sodium: 133 mmol/L — ABNORMAL LOW (ref 135–145)

## 2014-12-31 ENCOUNTER — Encounter (HOSPITAL_COMMUNITY): Payer: Self-pay | Admitting: Internal Medicine

## 2015-01-04 ENCOUNTER — Encounter: Payer: Self-pay | Admitting: Internal Medicine

## 2015-01-04 DIAGNOSIS — Z860101 Personal history of adenomatous and serrated colon polyps: Secondary | ICD-10-CM | POA: Insufficient documentation

## 2015-01-04 DIAGNOSIS — Z86010 Personal history of colonic polyps: Secondary | ICD-10-CM

## 2015-01-04 HISTORY — DX: Personal history of colonic polyps: Z86.010

## 2015-01-04 HISTORY — DX: Personal history of adenomatous and serrated colon polyps: Z86.0101

## 2015-01-04 NOTE — Progress Notes (Signed)
Quick Note:  6 adenomas Non-erosive gastritis No furthe GI w/u Routine colonoscopy 2019 health status permitting ______

## 2015-03-05 ENCOUNTER — Encounter (HOSPITAL_COMMUNITY): Payer: Self-pay | Admitting: Family Medicine

## 2015-03-05 ENCOUNTER — Emergency Department (HOSPITAL_COMMUNITY)
Admission: EM | Admit: 2015-03-05 | Discharge: 2015-03-06 | Disposition: A | Discharge status: Home / Self Care | Payer: Medicare Other | Attending: Emergency Medicine | Admitting: Emergency Medicine

## 2015-03-05 ENCOUNTER — Emergency Department (HOSPITAL_COMMUNITY): Payer: Medicare Other

## 2015-03-05 DIAGNOSIS — Z79899 Other long term (current) drug therapy: Secondary | ICD-10-CM | POA: Diagnosis not present

## 2015-03-05 DIAGNOSIS — N189 Chronic kidney disease, unspecified: Secondary | ICD-10-CM | POA: Diagnosis not present

## 2015-03-05 DIAGNOSIS — Z86010 Personal history of colonic polyps: Secondary | ICD-10-CM | POA: Diagnosis not present

## 2015-03-05 DIAGNOSIS — K219 Gastro-esophageal reflux disease without esophagitis: Secondary | ICD-10-CM | POA: Diagnosis not present

## 2015-03-05 DIAGNOSIS — R079 Chest pain, unspecified: Secondary | ICD-10-CM | POA: Diagnosis present

## 2015-03-05 DIAGNOSIS — K2990 Gastroduodenitis, unspecified, without bleeding: Secondary | ICD-10-CM | POA: Insufficient documentation

## 2015-03-05 DIAGNOSIS — I129 Hypertensive chronic kidney disease with stage 1 through stage 4 chronic kidney disease, or unspecified chronic kidney disease: Secondary | ICD-10-CM | POA: Insufficient documentation

## 2015-03-05 DIAGNOSIS — R1013 Epigastric pain: Secondary | ICD-10-CM | POA: Diagnosis not present

## 2015-03-05 DIAGNOSIS — Z862 Personal history of diseases of the blood and blood-forming organs and certain disorders involving the immune mechanism: Secondary | ICD-10-CM | POA: Insufficient documentation

## 2015-03-05 DIAGNOSIS — Z992 Dependence on renal dialysis: Secondary | ICD-10-CM | POA: Insufficient documentation

## 2015-03-05 DIAGNOSIS — M1990 Unspecified osteoarthritis, unspecified site: Secondary | ICD-10-CM | POA: Diagnosis not present

## 2015-03-05 DIAGNOSIS — K2970 Gastritis, unspecified, without bleeding: Secondary | ICD-10-CM

## 2015-03-05 DIAGNOSIS — E785 Hyperlipidemia, unspecified: Secondary | ICD-10-CM | POA: Diagnosis not present

## 2015-03-05 LAB — BASIC METABOLIC PANEL
Anion gap: 15 (ref 5–15)
BUN: 22 mg/dL — ABNORMAL HIGH (ref 6–20)
CO2: 30 mmol/L (ref 22–32)
Calcium: 10.2 mg/dL (ref 8.9–10.3)
Chloride: 93 mmol/L — ABNORMAL LOW (ref 101–111)
Creatinine, Ser: 5.72 mg/dL — ABNORMAL HIGH (ref 0.44–1.00)
GFR calc Af Amer: 8 mL/min — ABNORMAL LOW (ref >60)
GFR calc non Af Amer: 7 mL/min — ABNORMAL LOW (ref >60)
Glucose, Bld: 96 mg/dL (ref 65–99)
Potassium: 4.5 mmol/L (ref 3.5–5.1)
Sodium: 138 mmol/L (ref 135–145)

## 2015-03-05 LAB — CBC
HCT: 41.8 % (ref 36.0–46.0)
Hemoglobin: 13.1 g/dL (ref 12.0–15.0)
MCH: 26.5 pg (ref 26.0–34.0)
MCHC: 31.3 g/dL (ref 30.0–36.0)
MCV: 84.6 fL (ref 78.0–100.0)
Platelets: 163 10*3/uL (ref 150–400)
RBC: 4.94 MIL/uL (ref 3.87–5.11)
RDW: 18.1 % — ABNORMAL HIGH (ref 11.5–15.5)
WBC: 6.1 10*3/uL (ref 4.0–10.5)

## 2015-03-05 LAB — I-STAT TROPONIN, ED: Troponin i, poc: 0 ng/mL (ref 0.00–0.08)

## 2015-03-05 MED ORDER — ALUM & MAG HYDROXIDE-SIMETH 200-200-20 MG/5ML PO SUSP
30.0000 mL | Freq: Once | ORAL | Status: AC
  Administered 2015-03-05: 30 mL via ORAL
  Filled 2015-03-05: qty 30

## 2015-03-05 NOTE — ED Provider Notes (Signed)
CSN: [id number]     Arrival date & time 03/05/15  1847 History  By signing my name below, I, Terrance Branch, attest that this documentation has been prepared under the direction and in the presence of Everlene Balls, MD. Electronically Signed: Randa Evens, ED Scribe. 03/06/2015. 12:07 AM.     Chief Complaint  Patient presents with  . Chest Pain   The history is provided by the patient. No language interpreter was used.   HPI Comments: Alexa Dougherty is a 69 y.o. female who presents to the Emergency Department complaining of waxing and waning burning CP onset 1 week prior. Pt states that the pain recently worsened today around 5 PM. Pt reports getting diaphoretic when the pain was at its worse. Pt doesn't report any medications PTA. Denies vomiting, constipation, fever, cough or other related symptoms. Pt states that she is a dialysis pt and that her last dialysis was this morning. Pt is a Monday, Wednesday and Friday dialysis pt. Hx or GERD. Hx of recent endoscopy.   Past Medical History  Diagnosis Date  . Anemia   . Hypertension   . GERD (gastroesophageal reflux disease)   . Chronic kidney disease   . Dialysis patient ([place])   . Colon polyps   . Hyperlipemia   . Headache(784.0)   . Arthritis     Right shoulder  . Elevated cholesterol   . Hx of adenomatous colonic polyps 01/04/2015  . Gastritis and gastroduodenitis 12/29/2014   Past Surgical History  Procedure Laterality Date  . Tubal ligation  1980  . Av fistula placement  02/02/2012    Procedure: ARTERIOVENOUS (AV) FISTULA CREATION;  Surgeon: Angelia Mould, MD;  Location: [facility];  Service: Vascular;  Laterality: Left;  . Insertion of dialysis catheter  02/02/2012    Procedure: INSERTION OF DIALYSIS CATHETER;  Surgeon: Angelia Mould, MD;  Location: [place];  Service: Vascular;  Laterality: Right;  Insertion of 23cm dialysis catheter in Right Internal Jugular   . Fistulogram N/A 04/01/2012    Procedure: FISTULOGRAM;   Surgeon: Angelia Mould, MD;  Location: [facility] CATH LAB;  Service: Cardiovascular;  Laterality: N/A;  . Av fistula repair      Angioplasty of fistula multiple times Left  . Esophagogastroduodenoscopy (egd) with propofol N/A 12/29/2014    Procedure: ESOPHAGOGASTRODUODENOSCOPY (EGD) WITH PROPOFOL;  Surgeon: Gatha Mayer, MD;  Location: [facility];  Service: Endoscopy;  Laterality: N/A;  . Colonoscopy with propofol N/A 12/29/2014    Procedure: COLONOSCOPY WITH PROPOFOL;  Surgeon: Gatha Mayer, MD;  Location: [facility];  Service: Endoscopy;  Laterality: N/A;   Family History  Problem Relation Age of Onset  . Hypertension Mother   . Cancer Sister     type unknown  . Cancer Brother     type unknown  . Hypertension Daughter    Social History  Substance Use Topics  . Smoking status: Never Smoker   . Smokeless tobacco: Never Used  . Alcohol Use: No   OB History    No data available      Review of Systems  Constitutional: Negative for fever.  Respiratory: Negative for cough.   Cardiovascular: Positive for chest pain.  Gastrointestinal: Negative for nausea and vomiting.  All other systems reviewed and are negative.     Allergies  Review of patient's allergies indicates no known allergies.  Home Medications   Prior to Admission medications   Medication Sig Start Date End Date Taking? Authorizing Provider  acetaminophen (TYLENOL) 500  MG tablet Take 500 mg by mouth every 6 (six) hours as needed for moderate pain or headache.    Historical Provider, MD  amLODipine (NORVASC) 10 MG tablet Take 1 tablet by mouth every morning.  10/14/14   Historical Provider, MD  atorvastatin (LIPITOR) 10 MG tablet Take 10 mg by mouth every evening.     Historical Provider, MD  b complex-vitamin c-folic acid (NEPHRO-VITE) 0.8 MG TABS Take 0.8 mg by mouth every morning.     Historical Provider, MD  dexlansoprazole (DEXILANT) 60 MG capsule Take 60 mg by mouth every evening.     Historical  Provider, MD  metoprolol succinate (TOPROL-XL) 50 MG 24 hr tablet Take 50 mg by mouth daily at 12 noon.  01/03/12   Historical Provider, MD  RENVELA 800 MG tablet Take 2,400 mg by mouth 3 (three) times daily with meals.  01/15/12   Historical Provider, MD   BP 161/90 mmHg  Pulse 71  Temp(Src) 98.3 F (36.8 C) (Oral)  Resp 20  SpO2 100%   Physical Exam  Constitutional: She is oriented to person, place, and time. She appears well-developed and well-nourished. No distress.  HENT:  Head: Normocephalic and atraumatic.  Nose: Nose normal.  Mouth/Throat: Oropharynx is clear and moist. No oropharyngeal exudate.  Eyes: Conjunctivae and EOM are normal. Pupils are equal, round, and reactive to light. No scleral icterus.  Neck: Normal range of motion. Neck supple. No JVD present. No tracheal deviation present. No thyromegaly present.  Cardiovascular: Normal rate, regular rhythm and normal heart sounds.  Exam reveals no gallop and no friction rub.   No murmur heard. Pulmonary/Chest: Effort normal and breath sounds normal. No respiratory distress. She has no wheezes. She exhibits no tenderness.  Abdominal: Soft. Bowel sounds are normal. She exhibits no distension and no mass. There is no tenderness. There is no rebound and no guarding.  Musculoskeletal: Normal range of motion. She exhibits no edema or tenderness.  LUE av fistula with palpable thrill.   Lymphadenopathy:    She has no cervical adenopathy.  Neurological: She is alert and oriented to person, place, and time. No cranial nerve deficit. She exhibits normal muscle tone.  Skin: Skin is warm and dry. No rash noted. No erythema. No pallor.  Nursing note and vitals reviewed.   ED Course  Procedures (including critical care time) DIAGNOSTIC STUDIES: Oxygen Saturation is 100% on RA, normal by my interpretation.    COORDINATION OF CARE: 11:49 PM-Discussed treatment plan with pt at bedside and pt agreed to plan.     Labs Review Labs  Reviewed  BASIC METABOLIC PANEL - Abnormal; Notable for the following:    Chloride 93 (*)    BUN 22 (*)    Creatinine, Ser 5.72 (*)    GFR calc non Af Amer 7 (*)    GFR calc Af Amer 8 (*)    All other components within normal limits  CBC - Abnormal; Notable for the following:    RDW 18.1 (*)    All other components within normal limits  I-STAT TROPOININ, ED    Imaging Review Ct Abdomen Pelvis Wo Contrast  03/06/2015  CLINICAL DATA:  Mid epigastric pain for 1 week. Abdominal pain. No IV contrast material due to elevated labs. EXAM: CT ABDOMEN AND PELVIS WITHOUT CONTRAST TECHNIQUE: Multidetector CT imaging of the abdomen and pelvis was performed following the standard protocol without IV contrast. COMPARISON:  None. FINDINGS: Mild dependent atelectasis in the lung bases. Calcified right hilar lymph node.  Coronary artery calcifications. Cholelithiasis without gallbladder wall thickening or inflammatory change. No bile duct dilatation. Unenhanced appearance of the liver, spleen, pancreas, adrenal glands, inferior vena cava, and retroperitoneal lymph nodes is unremarkable. Calcification of abdominal aorta without aneurysm. Bilateral renal atrophy. Small cysts on both kidneys. No hydronephrosis. No hydroureter. Stomach and small bowel are decompressed. Contrast material flows through to the colon without evidence of bowel obstruction. Scattered diverticula throughout the colon. No colonic wall thickening or distention. No free air or free fluid in the abdomen. Pelvis: The appendix is normal. Uterus and ovaries are not enlarged. Bladder is decompressed without evidence of bladder wall thickening. No free or loculated pelvic fluid collections. No pelvic mass or lymphadenopathy. Degenerative changes and Schmorl's nodes in the spine. No destructive bone lesions. IMPRESSION: Bilateral renal atrophy. No acute process demonstrated in the abdomen or pelvis. No evidence of bowel obstruction or inflammation.  Diverticulosis coli. Electronically Signed   By: Lucienne Capers M.D.   On: 03/06/2015 02:56   Dg Chest 2 View  03/05/2015  CLINICAL DATA:  Abdominal pain, chest pain EXAM: CHEST  2 VIEW COMPARISON:  09/14/2014 FINDINGS: Cardiomediastinal silhouette is stable. Mild elevation of the right hemidiaphragm. No acute infiltrate or pleural effusion. No pulmonary edema. Vascular stent in left subclavian region again noted. IMPRESSION: No active cardiopulmonary disease. Electronically Signed   By: Lahoma Crocker M.D.   On: 03/05/2015 20:06   I have personally reviewed and evaluated these images and lab results as part of my medical decision-making.   EKG Interpretation None      MDM   Final diagnoses:  None    Patient presents to the emergency department for midepigastric abdominal pain which is burning and radiates up to her throat. She has a history of GERD and states this feels similar. She is an emergency department because she had diaphoresis and anxiety with this. She was given Maalox, will obtain CT scan to evaluate for any intra-abdominal pathology.  Also EKG is pending.   CT scan of the abdomen and pelvis does not reveal any acute cause for the patient's pain. She is no longer having abdominal pain, she appears well and in no acute distress. Vital signs remain within her normal limits and she is safe for discharge.  I, ONI,ADELEKE, personally performed the services described in this documentation. All medical record entries made by the scribe were at my direction and in my presence.  I have reviewed the chart and discharge instructions and agree that the record reflects my personal performance and is accurate and complete. ONI,ADELEKE.  03/06/2015. 12:10 AM.      Everlene Balls, MD 03/06/15 [phone]

## 2015-03-05 NOTE — ED Notes (Signed)
Pt here for chest pain that started today. Pt had dialysis this am. sts the pain is epigastric and burning and worse after eating.

## 2015-03-06 ENCOUNTER — Emergency Department (HOSPITAL_COMMUNITY): Payer: Medicare Other

## 2015-03-06 DIAGNOSIS — K2970 Gastritis, unspecified, without bleeding: Secondary | ICD-10-CM | POA: Diagnosis not present

## 2015-03-06 MED ORDER — IOHEXOL 300 MG/ML  SOLN
25.0000 mL | INTRAMUSCULAR | Status: AC
  Administered 2015-03-06: 25 mL via ORAL

## 2015-03-06 NOTE — ED Notes (Signed)
Pt discharged to home with family. Pt A/O x4 and ambulatory. NAD noted.

## 2015-03-06 NOTE — Discharge Instructions (Signed)
Abdominal Pain, Adult Alexa Dougherty, your CT scan today was normal. Continue to take your medication for reflux. See your regular doctor within 3 days for close follow-up. If any symptoms worsen come back to the emergency department immediately. Thank you.    ??????? [place], CT scan ??????????????????????????. ??????????????????????????????????? reflux. ??????????????????????????????????????? 3 ????????????????????????. ???????????????????????????????????????????????????????????. ??????. thannang Advincula CT scan  khongthan naimuni aemn pokkati  subtokan thichasai ya khongthansoalabkan reflux  boeng thanmo penpokkati khongthan phainaiuaela 3  mu soalabkan pid kantidtam  thahakva akan dai sudosm kabkhunpai bon phaaenk suksoen ma thanthi thandai  khob chai   Many things can cause belly (abdominal) pain. Most times, the belly pain is not dangerous. Many cases of belly pain can be watched and treated at home. HOME CARE   Do not take medicines that help you go poop (laxatives) unless told to by your doctor.  Only take medicine as told by your doctor.  Eat or drink as told by your doctor. Your doctor will tell you if you should be on a special diet. GET HELP IF:  You do not know what is causing your belly pain.  You have belly pain while you are sick to your stomach (nauseous) or have runny poop (diarrhea).  You have pain while you pee or poop.  Your belly pain wakes you up at night.  You have belly pain that gets worse or better when you eat.  You have belly pain that gets worse when you eat fatty foods.  You have a fever. GET HELP RIGHT AWAY IF:   The pain does not go away within 2 hours.  You keep throwing up (vomiting).  The pain changes and is only in the right or left part of the belly.  You have bloody or tarry looking poop. MAKE SURE YOU:   Understand these instructions.  Will watch your condition.  Will get help right away if you are not doing well or get worse.     This information is not intended to replace advice given to you by your health care provider. Make sure you discuss any questions you have with your health care provider.   Document Released: 10/25/2007 Document Revised: 05/29/2014 Document Reviewed: 01/15/2013 Elsevier Interactive Patient Education [employer].

## 2015-04-26 ENCOUNTER — Telehealth: Payer: Self-pay | Admitting: Internal Medicine

## 2015-04-26 NOTE — Telephone Encounter (Signed)
Patient will come on Thursday 04/29/15 2:45 to see Arta Bruce, PA Matthias Hughs will send labs and notes

## 2015-04-29 ENCOUNTER — Ambulatory Visit (INDEPENDENT_AMBULATORY_CARE_PROVIDER_SITE_OTHER): Payer: Medicare Other | Admitting: Physician Assistant

## 2015-04-29 ENCOUNTER — Encounter: Payer: Self-pay | Admitting: Physician Assistant

## 2015-04-29 VITALS — BP 162/86 | HR 72 | Ht 59.0 in | Wt 177.1 lb

## 2015-04-29 DIAGNOSIS — D509 Iron deficiency anemia, unspecified: Secondary | ICD-10-CM | POA: Diagnosis not present

## 2015-04-29 DIAGNOSIS — R195 Other fecal abnormalities: Secondary | ICD-10-CM | POA: Diagnosis not present

## 2015-04-29 NOTE — Patient Instructions (Signed)
We have been scheduled for a Capsule endoscopy. Please follow the instructions given.

## 2015-05-01 ENCOUNTER — Encounter: Payer: Self-pay | Admitting: Physician Assistant

## 2015-05-01 NOTE — Progress Notes (Signed)
Patient ID: Alexa Dougherty, female   DOB: 1945-11-16, 69 y.o.   MRN: [id number]     History of Present Illness: Alexa Dougherty is a pleasant 69 year old female who was seen in the office in July 2016 for evaluation of heme-positive stools and anemia. He has a history of hypertension, GERD, headaches, arthritis, chronic kidney disease for which she is on dialysis Monday, Wednesday and Fridays, she is status post a bilateral tubal ligation and placement of an AV fistula for dialysis in her right arm. She has been receiving dialysis since September 2013. She had a colonoscopy and upper endoscopy on 12/29/2014, the the findings of which are as follows: Colonoscopy: 1)8 polyps removed 2-7 mm size. cecum, transverse (3) descending (2) and sigmoid all removed cold snare and sent to [facility] 2 patholgy. ascending polyp removed cold biopsy. 2) Pan-diverticulosis 3) Internal hemorrhoids 4) Otherwise normal colon and rectum - good prep RECOMMENDATIONS: 1. Await pathology results 2. Continue current medications - no further GI w/u She was advised to have surveillance in 2019, health permitting. [id number] IMPRESSION: Antral gastritis - streaks of submocosal heme and erythema, no erosions. otherwise normal. Gastritis biopsied RECOMMENDATIONS: 1. Await pathology results 2. Proceed with a Colonoscopy.  She has been feeling well but blood work at dialysis on 02/24/2015 had a hemoglobin of 11.9, and recent hemoglobin on 04/21/2015 was 8.2. Patient has been feeling well but says her stools have intermittently been dark. Stool for fecal occult blood was obtained by dialysis was found to be Hemoccult-positive.  Past Medical History  Diagnosis Date  . Anemia   . Hypertension   . GERD (gastroesophageal reflux disease)   . Chronic kidney disease   . Dialysis patient ([place])   . Colon polyps   . Hyperlipemia   . Headache(784.0)   . Arthritis     Right shoulder  . Elevated cholesterol   . Hx of  adenomatous colonic polyps 01/04/2015  . Gastritis and gastroduodenitis 12/29/2014    Past Surgical History  Procedure Laterality Date  . Tubal ligation  1980  . Av fistula placement  02/02/2012    Procedure: ARTERIOVENOUS (AV) FISTULA CREATION;  Surgeon: Angelia Mould, MD;  Location: [place];  Service: Vascular;  Laterality: Left;  . Insertion of dialysis catheter  02/02/2012    Procedure: INSERTION OF DIALYSIS CATHETER;  Surgeon: Angelia Mould, MD;  Location: [place];  Service: Vascular;  Laterality: Right;  Insertion of 23cm dialysis catheter in Right Internal Jugular   . Fistulogram N/A 04/01/2012    Procedure: FISTULOGRAM;  Surgeon: Angelia Mould, MD;  Location: [facility] CATH LAB;  Service: Cardiovascular;  Laterality: N/A;  . Av fistula repair      Angioplasty of fistula multiple times Left  . Esophagogastroduodenoscopy (egd) with propofol N/A 12/29/2014    Procedure: ESOPHAGOGASTRODUODENOSCOPY (EGD) WITH PROPOFOL;  Surgeon: Gatha Mayer, MD;  Location: [facility];  Service: Endoscopy;  Laterality: N/A;  . Colonoscopy with propofol N/A 12/29/2014    Procedure: COLONOSCOPY WITH PROPOFOL;  Surgeon: Gatha Mayer, MD;  Location: [facility];  Service: Endoscopy;  Laterality: N/A;   Family History  Problem Relation Age of Onset  . Hypertension Mother   . Cancer Sister     type unknown  . Cancer Brother     type unknown  . Hypertension Daughter    Social History  Substance Use Topics  . Smoking status: Never Smoker   . Smokeless tobacco: Never Used  . Alcohol Use: No  Current Outpatient Prescriptions  Medication Sig Dispense Refill  . acetaminophen (TYLENOL) 500 MG tablet Take 500 mg by mouth every 6 (six) hours as needed for moderate pain or headache.    Marland Kitchen amLODipine (NORVASC) 10 MG tablet Take 1 tablet by mouth every evening.     Marland Kitchen atorvastatin (LIPITOR) 10 MG tablet Take 10 mg by mouth every evening.     Marland Kitchen b complex-vitamin c-folic acid (NEPHRO-VITE) 0.8 MG  TABS Take 0.8 mg by mouth every morning.     Marland Kitchen dexlansoprazole (DEXILANT) 60 MG capsule Take 60 mg by mouth every evening.     . metoprolol succinate (TOPROL-XL) 50 MG 24 hr tablet Take 100 mg by mouth.     . RENVELA 800 MG tablet Take 2,400 mg by mouth 3 (three) times daily with meals.      No current facility-administered medications for this visit.   No Known Allergies   Review of Systems: Gen: Denies any fever, chills, sweats, anorexia, fatigue, weakness, malaise, weight loss, and sleep disorder CV: Denies chest pain, angina, palpitations, syncope, orthopnea, PND, peripheral edema, and claudication. Resp: Denies dyspnea at rest, dyspnea with exercise, cough, sputum, wheezing, coughing up blood, and pleurisy. GI: Denies vomiting blood, jaundice, and fecal incontinence.   Denies dysphagia or odynophagia. GU : Denies urinary burning, blood in urine, urinary frequency, urinary hesitancy, nocturnal urination, and urinary incontinence. MS: Denies joint pain, limitation of movement, and swelling, stiffness, low back pain, extremity pain. Denies muscle weakness, cramps, atrophy.  Derm: Denies rash, itching, dry skin, hives, moles, warts, or unhealing ulcers.  Psych: Denies depression, anxiety, memory loss, suicidal ideation, hallucinations, paranoia, and confusion. Heme: Denies bruising, bleeding, and enlarged lymph nodes. Neuro:  Denies any headaches, dizziness, paresthesia Endo:  Denies any problems with DM, thyroid, adrenal  LAB RESULTS: Labs from dialysis reveal hemoglobin on 02/24/2015 of 11.9, hemoglobin 03/10/1999 1611.5, hemoglobin 03/31/2015 8.2, hemoglobin 04/21/2015 8.2.   Physical Exam: BP 162/86 mmHg  Pulse 72  Ht 4\' 11"  (1.499 m)  Wt 177 lb 0.9 oz (80.312 kg)  BMI 35.74 kg/m2 Constitutional: Pleasant,well-developed female in no acute distress. HEENT: Normocephalic and atraumatic. Conjunctivae are normal. No scleral icterus. Neck supple.  Cardiovascular: Normal rate,  regular rhythm.  Pulmonary/chest: Effort normal and breath sounds normal. No wheezing, rales or rhonchi. Abdominal: Soft, nondistended, nontender. Bowel sounds active throughout. There are no masses palpable. No hepatomegaly. Extremities: no edema. Left antecubital dialysis fistula with palpable thrill. Lymphadenopathy: No cervical adenopathy noted. Neurological: Alert and oriented to person place and time. Skin: Skin is warm and dry. No rashes noted. Psychiatric: Normal mood and affect. Behavior is normal  Assessment and Recommendations: 69 year old female with a history of chronic kidney disease found to have a drop in hemoglobin with heme positive stools. Patient recently had a colonoscopy with removal of polyps and EGD with antral gastritis. Patient will be scheduled for a pill endoscopy to evaluate for a small bowel source of blood loss. A repeat CBC and iron studies will be obtained as well. Further recommendations will be made pending the findings of the above.        Hvozdovic, Vita Barley PA-C 05/01/2015,

## 2015-05-03 NOTE — Progress Notes (Signed)
Agree w/ Alexa Dougherty's note and mangement.  

## 2015-05-05 ENCOUNTER — Telehealth: Payer: Self-pay | Admitting: Internal Medicine

## 2015-05-05 NOTE — Telephone Encounter (Signed)
Daughter notified 

## 2015-05-11 ENCOUNTER — Ambulatory Visit (INDEPENDENT_AMBULATORY_CARE_PROVIDER_SITE_OTHER): Payer: Medicare Other | Admitting: Internal Medicine

## 2015-05-11 DIAGNOSIS — D509 Iron deficiency anemia, unspecified: Secondary | ICD-10-CM | POA: Diagnosis not present

## 2015-05-11 DIAGNOSIS — K921 Melena: Secondary | ICD-10-CM

## 2015-05-11 NOTE — Progress Notes (Signed)
Patient here for capsule endoscopy. Tolerated procedure. Verbalizes understanding of written and verbal instructions. Daughter here with the patient.  Capsule ID [id number] lot [id number] Exp. 2017-12

## 2015-05-12 ENCOUNTER — Telehealth: Payer: Self-pay | Admitting: Internal Medicine

## 2015-05-12 NOTE — Telephone Encounter (Signed)
Noted  

## 2015-05-13 NOTE — Progress Notes (Signed)
Capsule endoscopy Gastritis O/w negative

## 2015-05-14 ENCOUNTER — Telehealth: Payer: Self-pay

## 2015-05-14 NOTE — Telephone Encounter (Signed)
Left message on machine to call back  

## 2015-05-14 NOTE — Telephone Encounter (Signed)
Pt daughter has been notified and report has been sent to PCP and renal

## 2015-05-14 NOTE — Telephone Encounter (Signed)
-----   Message from Gatha Mayer, MD sent at 05/13/2015  5:27 PM EST ----- Regarding: capsule endo Let her know no new news - gastritis only as we knew from EGD  If she gets more proven bleeding will consider scoping again  Please cc reports to PCP and renal (left on Sheri's desk)

## 2015-05-18 ENCOUNTER — Encounter: Payer: Self-pay | Admitting: Internal Medicine

## 2015-05-19 ENCOUNTER — Telehealth: Payer: Self-pay

## 2015-05-19 NOTE — Telephone Encounter (Signed)
-----   Message from Alfredia Ferguson, PA-C sent at 05/18/2015 12:02 PM EST ----- Regarding: FW: capsule endo Please let pt know... ----- Message -----    From: Gatha Mayer, MD    Sent: 05/13/2015   5:27 PM      To: Marlon Pel, RN, Amy Genia Harold, PA-C Subject: capsule endo                                   Let her know no new news - gastritis only as we knew from EGD  If she gets more proven bleeding will consider scoping again  Please cc reports to PCP and renal (left on Sheri's desk)

## 2015-05-19 NOTE — Telephone Encounter (Signed)
Patient notified

## 2015-06-30 ENCOUNTER — Telehealth: Payer: Self-pay | Admitting: Internal Medicine

## 2015-06-30 ENCOUNTER — Encounter (HOSPITAL_COMMUNITY)
Admission: RE | Admit: 2015-06-30 | Discharge: 2015-06-30 | Disposition: A | Discharge status: Home / Self Care | Payer: Medicare Other | Source: Ambulatory Visit | Attending: Nephrology | Admitting: Nephrology

## 2015-06-30 DIAGNOSIS — D649 Anemia, unspecified: Secondary | ICD-10-CM | POA: Insufficient documentation

## 2015-06-30 DIAGNOSIS — R195 Other fecal abnormalities: Secondary | ICD-10-CM | POA: Diagnosis not present

## 2015-06-30 LAB — PREPARE RBC (CROSSMATCH)

## 2015-06-30 NOTE — Telephone Encounter (Signed)
Patient with Hgb 7.1 again.  She had negative colonoscopy, gastritis on EGD and capsule.  Nephrology has arranged for transfusion.  They want [place] to see her urgently.  Please advise

## 2015-06-30 NOTE — Telephone Encounter (Signed)
[place] notified.  Copy of notes faxed to [phone]

## 2015-06-30 NOTE — Telephone Encounter (Signed)
Unless there is melena, hematochezia I do not intend repeating work-up so urgent evaluation or any repeat evaluation does not seem warranted.  She may still be heme + but that alone would not make me repeat the work-up.  Please communicate that I do not think GI visit again necessary unless there is obvious signs of bleeding. Supportive care with iron and blood and EPO.

## 2015-07-01 ENCOUNTER — Encounter (HOSPITAL_COMMUNITY)
Admission: RE | Admit: 2015-07-01 | Discharge: 2015-07-01 | Disposition: A | Discharge status: Home / Self Care | Payer: Medicare Other | Source: Ambulatory Visit | Attending: Nephrology | Admitting: Nephrology

## 2015-07-01 DIAGNOSIS — D649 Anemia, unspecified: Secondary | ICD-10-CM | POA: Diagnosis not present

## 2015-07-01 MED ORDER — SODIUM CHLORIDE 0.9 % IV SOLN
Freq: Once | INTRAVENOUS | Status: AC
  Administered 2015-07-01: 08:00:00 via INTRAVENOUS

## 2015-07-02 LAB — TYPE AND SCREEN
ABO/RH(D): A POS
Antibody Screen: NEGATIVE
Unit division: 0
Unit division: 0

## 2016-09-01 ENCOUNTER — Encounter (HOSPITAL_COMMUNITY): Payer: Self-pay | Admitting: *Deleted

## 2016-09-01 ENCOUNTER — Observation Stay (HOSPITAL_COMMUNITY)
Admission: EM | Admit: 2016-09-01 | Discharge: 2016-09-02 | Disposition: A | Discharge status: Home / Self Care | Payer: Medicare Other | Attending: Family Medicine | Admitting: Family Medicine

## 2016-09-01 ENCOUNTER — Observation Stay (HOSPITAL_COMMUNITY): Payer: Medicare Other

## 2016-09-01 DIAGNOSIS — K2970 Gastritis, unspecified, without bleeding: Secondary | ICD-10-CM | POA: Diagnosis present

## 2016-09-01 DIAGNOSIS — D649 Anemia, unspecified: Principal | ICD-10-CM | POA: Diagnosis present

## 2016-09-01 DIAGNOSIS — Z79899 Other long term (current) drug therapy: Secondary | ICD-10-CM | POA: Insufficient documentation

## 2016-09-01 DIAGNOSIS — N186 End stage renal disease: Secondary | ICD-10-CM | POA: Diagnosis not present

## 2016-09-01 DIAGNOSIS — E785 Hyperlipidemia, unspecified: Secondary | ICD-10-CM | POA: Diagnosis present

## 2016-09-01 DIAGNOSIS — K2990 Gastroduodenitis, unspecified, without bleeding: Secondary | ICD-10-CM

## 2016-09-01 DIAGNOSIS — I120 Hypertensive chronic kidney disease with stage 5 chronic kidney disease or end stage renal disease: Secondary | ICD-10-CM | POA: Diagnosis not present

## 2016-09-01 DIAGNOSIS — R531 Weakness: Secondary | ICD-10-CM

## 2016-09-01 DIAGNOSIS — Z992 Dependence on renal dialysis: Secondary | ICD-10-CM | POA: Insufficient documentation

## 2016-09-01 DIAGNOSIS — I10 Essential (primary) hypertension: Secondary | ICD-10-CM | POA: Diagnosis present

## 2016-09-01 LAB — CBC WITH DIFFERENTIAL/PLATELET
Basophils Absolute: 0 10*3/uL (ref 0.0–0.1)
Basophils Relative: 0 %
Eosinophils Absolute: 0 10*3/uL (ref 0.0–0.7)
Eosinophils Relative: 0 %
HCT: 23.5 % — ABNORMAL LOW (ref 36.0–46.0)
Hemoglobin: 7.6 g/dL — ABNORMAL LOW (ref 12.0–15.0)
Lymphocytes Relative: 16 %
Lymphs Abs: 1.8 10*3/uL (ref 0.7–4.0)
MCH: 27 pg (ref 26.0–34.0)
MCHC: 32.3 g/dL (ref 30.0–36.0)
MCV: 83.6 fL (ref 78.0–100.0)
Monocytes Absolute: 0.6 10*3/uL (ref 0.1–1.0)
Monocytes Relative: 5 %
Neutro Abs: 9 10*3/uL — ABNORMAL HIGH (ref 1.7–7.7)
Neutrophils Relative %: 79 %
Platelets: 200 10*3/uL (ref 150–400)
RBC: 2.81 MIL/uL — ABNORMAL LOW (ref 3.87–5.11)
RDW: 17.5 % — ABNORMAL HIGH (ref 11.5–15.5)
WBC: 11.4 10*3/uL — ABNORMAL HIGH (ref 4.0–10.5)

## 2016-09-01 LAB — TROPONIN I: Troponin I: <0.03 ng/mL (ref <0.03)

## 2016-09-01 LAB — VITAMIN B12: Vitamin B-12: 1205 pg/mL — ABNORMAL HIGH (ref 180–914)

## 2016-09-01 LAB — COMPREHENSIVE METABOLIC PANEL
ALT: 20 U/L (ref 14–54)
AST: 32 U/L (ref 15–41)
Albumin: 3.9 g/dL (ref 3.5–5.0)
Alkaline Phosphatase: 249 U/L — ABNORMAL HIGH (ref 38–126)
Anion gap: 15 (ref 5–15)
BUN: 12 mg/dL (ref 6–20)
CO2: 28 mmol/L (ref 22–32)
Calcium: 9.2 mg/dL (ref 8.9–10.3)
Chloride: 95 mmol/L — ABNORMAL LOW (ref 101–111)
Creatinine, Ser: 3.4 mg/dL — ABNORMAL HIGH (ref 0.44–1.00)
GFR calc Af Amer: 14 mL/min — ABNORMAL LOW (ref >60)
GFR calc non Af Amer: 13 mL/min — ABNORMAL LOW (ref >60)
Glucose, Bld: 118 mg/dL — ABNORMAL HIGH (ref 65–99)
Potassium: 3.3 mmol/L — ABNORMAL LOW (ref 3.5–5.1)
Sodium: 138 mmol/L (ref 135–145)
Total Bilirubin: 0.5 mg/dL (ref 0.3–1.2)
Total Protein: 8.2 g/dL — ABNORMAL HIGH (ref 6.5–8.1)

## 2016-09-01 LAB — IRON AND TIBC
Iron: 64 ug/dL (ref 28–170)
Saturation Ratios: 24 % (ref 10.4–31.8)
TIBC: 267 ug/dL (ref 250–450)
UIBC: 203 ug/dL

## 2016-09-01 LAB — PROTIME-INR
INR: 0.98
Prothrombin Time: 13 seconds (ref 11.4–15.2)

## 2016-09-01 LAB — I-STAT TROPONIN, ED: Troponin i, poc: 0 ng/mL (ref 0.00–0.08)

## 2016-09-01 LAB — TSH: TSH: 0.591 u[IU]/mL (ref 0.350–4.500)

## 2016-09-01 LAB — I-STAT CHEM 8, ED
BUN: 13 mg/dL (ref 6–20)
Calcium, Ion: 0.93 mmol/L — ABNORMAL LOW (ref 1.15–1.40)
Chloride: 97 mmol/L — ABNORMAL LOW (ref 101–111)
Creatinine, Ser: 3.5 mg/dL — ABNORMAL HIGH (ref 0.44–1.00)
Glucose, Bld: 118 mg/dL — ABNORMAL HIGH (ref 65–99)
HCT: 24 % — ABNORMAL LOW (ref 36.0–46.0)
Hemoglobin: 8.2 g/dL — ABNORMAL LOW (ref 12.0–15.0)
Potassium: 3.3 mmol/L — ABNORMAL LOW (ref 3.5–5.1)
Sodium: 138 mmol/L (ref 135–145)
TCO2: 30 mmol/L (ref 0–100)

## 2016-09-01 LAB — POC OCCULT BLOOD, ED: Fecal Occult Bld: NEGATIVE

## 2016-09-01 LAB — CK: Total CK: 39 U/L (ref 38–234)

## 2016-09-01 LAB — RETICULOCYTES
RBC.: 2.72 MIL/uL — ABNORMAL LOW (ref 3.87–5.11)
Retic Count, Absolute: 84.3 10*3/uL (ref 19.0–186.0)
Retic Ct Pct: 3.1 % (ref 0.4–3.1)

## 2016-09-01 LAB — MRSA PCR SCREENING: MRSA by PCR: NEGATIVE

## 2016-09-01 LAB — PREPARE RBC (CROSSMATCH)

## 2016-09-01 LAB — PHOSPHORUS: Phosphorus: 2.9 mg/dL (ref 2.5–4.6)

## 2016-09-01 LAB — FERRITIN: Ferritin: 995 ng/mL — ABNORMAL HIGH (ref 11–307)

## 2016-09-01 LAB — FOLATE: Folate: 36.2 ng/mL (ref >5.9)

## 2016-09-01 MED ORDER — PANTOPRAZOLE SODIUM 40 MG PO TBEC
40.0000 mg | DELAYED_RELEASE_TABLET | Freq: Every day | ORAL | Status: DC
  Administered 2016-09-01 – 2016-09-02 (×2): 40 mg via ORAL
  Filled 2016-09-01 (×2): qty 1

## 2016-09-01 MED ORDER — KETOROLAC TROMETHAMINE 15 MG/ML IJ SOLN: 15.0000 mg | Freq: Once | INTRAMUSCULAR | Status: DC

## 2016-09-01 MED ORDER — ATORVASTATIN CALCIUM 10 MG PO TABS
10.0000 mg | ORAL_TABLET | Freq: Every evening | ORAL | Status: DC
  Administered 2016-09-01: 10 mg via ORAL
  Filled 2016-09-01: qty 1

## 2016-09-01 MED ORDER — ONDANSETRON HCL 4 MG PO TABS: 4.0000 mg | ORAL_TABLET | Freq: Four times a day (QID) | ORAL | Status: DC | PRN

## 2016-09-01 MED ORDER — AMLODIPINE BESYLATE 10 MG PO TABS
10.0000 mg | ORAL_TABLET | Freq: Every evening | ORAL | Status: DC
  Administered 2016-09-01: 10 mg via ORAL
  Filled 2016-09-01: qty 1

## 2016-09-01 MED ORDER — METOPROLOL SUCCINATE ER 100 MG PO TB24
100.0000 mg | ORAL_TABLET | Freq: Every day | ORAL | Status: DC
  Administered 2016-09-01: 100 mg via ORAL

## 2016-09-01 MED ORDER — ONDANSETRON HCL 4 MG/2ML IJ SOLN: 4.0000 mg | Freq: Four times a day (QID) | INTRAMUSCULAR | Status: DC | PRN

## 2016-09-01 MED ORDER — RENA-VITE PO TABS
1.0000 | ORAL_TABLET | Freq: Every day | ORAL | Status: DC
  Filled 2016-09-01: qty 1

## 2016-09-01 MED ORDER — ACETAMINOPHEN 650 MG RE SUPP: 650.0000 mg | Freq: Four times a day (QID) | RECTAL | Status: DC | PRN

## 2016-09-01 MED ORDER — ACETAMINOPHEN 325 MG PO TABS: 650.0000 mg | ORAL_TABLET | Freq: Four times a day (QID) | ORAL | Status: DC | PRN

## 2016-09-01 MED ORDER — SEVELAMER CARBONATE 800 MG PO TABS
2400.0000 mg | ORAL_TABLET | Freq: Three times a day (TID) | ORAL | Status: DC
  Administered 2016-09-02: 2400 mg via ORAL
  Filled 2016-09-01 (×2): qty 3

## 2016-09-01 MED ORDER — SODIUM CHLORIDE 0.9 % IV SOLN: Freq: Once | INTRAVENOUS | Status: DC

## 2016-09-01 MED ORDER — METOPROLOL SUCCINATE ER 100 MG PO TB24
100.0000 mg | ORAL_TABLET | Freq: Every day | ORAL | Status: DC
  Filled 2016-09-01: qty 1

## 2016-09-01 NOTE — ED Provider Notes (Signed)
[place] DEPT Provider Note   CSN: [id number] Arrival date & time: 09/01/16  1141     History   Chief Complaint Chief Complaint  Patient presents with  . Weakness  . Abnormal Lab    HPI Alexa Dougherty is a 71 y.o. female.  HPI 71 year old female past medical history significant for CKD currently on hemodialysis, hypertension, presents to the ED today with complaints of weakness and abnormal lab results. Patient finished her dialysis today when she was sleeping in the lobby waiting to be picked up by her daughter. Her daughter Noticed that her mom seemed a little weak and appeared pale in the chair. States that her mom has a history of anemia and has been any blood transfusions for a while. They took her back to the back of dialysis and gave her some fluids and some oxygen but she still appeared weak and they felt that she needed to be evaluated in the ED and sent her by EMS. On exam patient states that she has felt weak for the past 2 days. States she just felt like she has not had any energy. She denies any other complaints including fever, chills, headache, vision changes, lightheadedness, dizziness, chest pain, shortness of breath, abdominal pain, nausea, emesis, urinary symptoms, paresthesias. Past Medical History:  Diagnosis Date  . Anemia   . Arthritis    Right shoulder  . Chronic kidney disease   . Colon polyps   . Dialysis patient ([name])   . Elevated cholesterol   . Gastritis and gastroduodenitis 12/29/2014  . GERD (gastroesophageal reflux disease)   . Headache(784.0)   . Hx of adenomatous colonic polyps 01/04/2015  . Hyperlipemia   . Hypertension     Patient Active Problem List   Diagnosis Date Noted  . Hx of adenomatous colonic polyps 01/04/2015  . Gastritis and gastroduodenitis 12/29/2014  . End stage renal disease ([place]) 01/24/2012    Past Surgical History:  Procedure Laterality Date  . AV FISTULA PLACEMENT  02/02/2012   Procedure: ARTERIOVENOUS (AV)  FISTULA CREATION;  Surgeon: Angelia Mould, MD;  Location[facility] OR;  Service: Vascular;  Laterality: Left;  . AV FISTULA REPAIR     Angioplasty of fistula multiple times Left  . COLONOSCOPY WITH PROPOFOL N/A 12/29/2014   Procedure: COLONOSCOPY WITH PROPOFOL;  Surgeon: Gatha Mayer, MD;  Location: [facility] ENDOSCOPY;  Service: Endoscopy;  Laterality: N/A;  . ESOPHAGOGASTRODUODENOSCOPY (EGD) WITH PROPOFOL N/A 12/29/2014   Procedure: ESOPHAGOGASTRODUODENOSCOPY (EGD) WITH PROPOFOL;  Surgeon: Gatha Mayer, MD;  Location: [facility] ENDOSCOPY;  Service: Endoscopy;  Laterality: N/A;  . FISTULOGRAM N/A 04/01/2012   Procedure: FISTULOGRAM;  Surgeon: Angelia Mould, MD;  Location: [facility] CATH LAB;  Service: Cardiovascular;  Laterality: N/A;  . INSERTION OF DIALYSIS CATHETER  02/02/2012   Procedure: INSERTION OF DIALYSIS CATHETER;  Surgeon: Angelia Mould, MD;  Location: [place];  Service: Vascular;  Laterality: Right;  Insertion of 23cm dialysis catheter in Right Internal Jugular   . TUBAL LIGATION  1980    OB History    No data available       Home Medications    Prior to Admission medications   Medication Sig Start Date End Date Taking? Authorizing Provider  acetaminophen (TYLENOL) 500 MG tablet Take 500 mg by mouth every 6 (six) hours as needed for moderate pain or headache.    Historical Provider, MD  amLODipine (NORVASC) 10 MG tablet Take 1 tablet by mouth every evening.  10/14/14   Historical Provider, MD  atorvastatin (LIPITOR) 10 MG tablet Take 10 mg by mouth every evening.     Historical Provider, MD  b complex-vitamin c-folic acid (NEPHRO-VITE) 0.8 MG TABS Take 0.8 mg by mouth every morning.     Historical Provider, MD  dexlansoprazole (DEXILANT) 60 MG capsule Take 60 mg by mouth every evening.     Historical Provider, MD  metoprolol succinate (TOPROL-XL) 50 MG 24 hr tablet Take 100 mg by mouth.  01/03/12   Historical Provider, MD  RENVELA 800 MG tablet Take 2,400 mg by mouth 3 (three)  times daily with meals.  01/15/12   Historical Provider, MD    Family History Family History  Problem Relation Age of Onset  . Hypertension Mother   . Cancer Sister     type unknown  . Cancer Brother     type unknown  . Hypertension Daughter     Social History Social History  Substance Use Topics  . Smoking status: Never Smoker  . Smokeless tobacco: Never Used  . Alcohol use No     Allergies   Patient has no known allergies.   Review of Systems Review of Systems  Constitutional: Positive for activity change. Negative for chills and fever.  HENT: Negative for congestion.   Eyes: Negative for visual disturbance.  Respiratory: Negative for cough and shortness of breath.   Cardiovascular: Negative for chest pain and palpitations.  Gastrointestinal: Negative for abdominal pain, diarrhea, nausea and vomiting.  Genitourinary: Negative for dysuria, flank pain, hematuria and urgency.  Skin: Positive for pallor.  Neurological: Positive for weakness. Negative for dizziness, light-headedness, numbness and headaches.     Physical Exam Updated Vital Signs BP 129/73   Pulse 85   Temp 97.4 F (36.3 C) (Oral)   Resp 18   Ht 5\' 1"  (1.549 m)   Wt 79 kg   SpO2 99%   BMI 32.91 kg/m   Physical Exam  Constitutional: She is oriented to person, place, and time. She appears well-developed and well-nourished. No distress.  Nontoxic appearing. No acute distress.  HENT:  Head: Normocephalic and atraumatic.  Mouth/Throat: Oropharynx is clear and moist.  Eyes: EOM are normal. Pupils are equal, round, and reactive to light. Right eye exhibits no discharge. Left eye exhibits no discharge. No scleral icterus.  Conjunctival pallor noted.  Neck: Normal range of motion. Neck supple. No thyromegaly present.  Cardiovascular: Normal rate, regular rhythm, normal heart sounds and intact distal pulses.  Exam reveals no gallop and no friction rub.   No murmur heard. Pulmonary/Chest: Effort normal  and breath sounds normal. No respiratory distress. She has no wheezes. She has no rales. She exhibits no tenderness.  Abdominal: Soft. Bowel sounds are normal. She exhibits no distension. There is no tenderness. There is no rigidity, no rebound, no guarding and no CVA tenderness.  Musculoskeletal: Normal range of motion.  Lymphadenopathy:    She has no cervical adenopathy.  Neurological: She is alert and oriented to person, place, and time.  The patient is alert, attentive, and oriented x 3. Speech is clear. Cranial nerve II-VII grossly intact. Negative pronator drift. Sensation intact. Strength 5/5 in all extremities. Reflexes 2+ and symmetric at biceps, triceps, knees, and ankles. Rapid alternating movement and fine finger movements intact.    Skin: Skin is warm and dry. Capillary refill takes less than 2 seconds. There is pallor.  Nursing note and vitals reviewed.    ED Treatments / Results  Labs (all labs ordered are listed, but only abnormal results  are displayed) Labs Reviewed  CBC WITH DIFFERENTIAL/PLATELET - Abnormal; Notable for the following:       Result Value   WBC 11.4 (*)    RBC 2.81 (*)    Hemoglobin 7.6 (*)    HCT 23.5 (*)    RDW 17.5 (*)    Neutro Abs 9.0 (*)    All other components within normal limits  COMPREHENSIVE METABOLIC PANEL - Abnormal; Notable for the following:    Potassium 3.3 (*)    Chloride 95 (*)    Glucose, Bld 118 (*)    Creatinine, Ser 3.40 (*)    Total Protein 8.2 (*)    Alkaline Phosphatase 249 (*)    GFR calc non Af Amer 13 (*)    GFR calc Af Amer 14 (*)    All other components within normal limits  I-STAT CHEM 8, ED - Abnormal; Notable for the following:    Potassium 3.3 (*)    Chloride 97 (*)    Creatinine, Ser 3.50 (*)    Glucose, Bld 118 (*)    Calcium, Ion 0.93 (*)    Hemoglobin 8.2 (*)    HCT 24.0 (*)    All other components within normal limits  PROTIME-INR  I-STAT TROPOININ, ED  POC OCCULT BLOOD, ED  TYPE AND SCREEN     EKG  EKG Interpretation None       Radiology No results found.  Procedures Procedures (including critical care time)  Medications Ordered in ED Medications - No data to display   Initial Impression / Assessment and Plan / ED Course  I have reviewed the triage vital signs and the nursing notes.  Pertinent labs & imaging results that were available during my care of the patient were reviewed by me and considered in my medical decision making (see chart for details).   Patient presents to the ED from dialysis today with noted low hemoglobin. Patient has been feeling weak over the past 2 days with pallor noted. She has no other complaints. Initial troponin was negative. Hemoglobin in the ED was 7.6. Creatinine is at baseline as patient is a hemodialysis patient. Potassium is 3.3. She was hemo dialyzed today. Fecal occult was negative. The vital signs are stable. Spoke with patient's dialysis center to see if she could have dialysis on Monday with transfusion of blood. They do not do blood transfusions at the dialysis center. Feel the patient needs blood transfusion due to symptomatic anemia. The patient was seen and evaluated by Dr. Ralene Bathe was agreeable. Above plan. Spoke with Ebony Hail with [facility] agrees to admit patient. Will come to the ED to evaluate pt and place admission orders. Patient was updated on plan of care. She is currently hemodynamically stable and in no acute distress on admission.   Final Clinical Impressions(s) / ED Diagnoses   Final diagnoses:  Symptomatic anemia    New Prescriptions New Prescriptions   No medications on file     Doristine Devoid, PA-C 09/01/16 [place], MD 09/02/16 [id number]

## 2016-09-01 NOTE — ED Triage Notes (Signed)
Pt arrives from dialysis center via [facility]. Pt finished all of her tx today when she began to feel weak. The center checked her labs and noted her hemoglobin to be 6.1. Pt sent here for further eval. Pt has no c/o any other sx.

## 2016-09-01 NOTE — H&P (Signed)
History and Physical    Alexa Dougherty YPP:[id number] DOB: 09/08/1945 DOA: 09/01/2016   PCP: Bartholome Bill, MD   Patient coming from/Resides with:   Admission status: Observation/telemetry -it may be medically necessary to stay a minimum 2 midnights to rule out impending and/or unexpected changes in physiologic status that may differ from initial evaluation performed in the ER and/or at time of admission therefore consider reevaluation of admission status 24 hours.   Chief Complaint: Generalized weakness  HPI: Alexa Dougherty is a 71 y.o. female with medical history significant for CKD on hemodialysis MWF, hypertension, each LD, chronic anemia, history of antral gastritis and heme positive stools 2016. Patient was sent over from dialysis today reporting significant weakness that did not respond to fluid challenge and administration of oral glucose. Family and patient report progressive weakness worse over the past one week. On 4/12 patient went to her PCP because of right ankle discomfort and swelling without erythema. Empiric prednisone taper was prescribed. Subsequent uric acid level has returned back normal at 4.6. Today the patient is very restless and complaining of generalized weakness and headache. He has not had any fevers or chills. No nausea and vomiting or abdominal pain. No chest pain or shortness of breath.  ED Course:  Vital Signs: BP (!) 147/78   Pulse 86   Temp 97.4 F (36.3 C) (Oral)   Resp 20   Ht 5\' 1"  (1.549 m)   Wt 79 kg (174 lb 2.6 oz)   SpO2 100%   BMI 32.91 kg/m  Lab data: Na 138, K 3.3, Cl 95, CO2 28, BUN 12, Cr 3.40, Gluc 118, AP 249, LFTs WNL, WBC 11,400, neutro 79%, abs neutro 9.0%, coags WNL, FOB neg Medications and treatments: None  Review of Systems:  In addition to the HPI above,  No Fever-chills No Headache, changes with Vision or hearing, new weakness, tingling, numbness in any extremity, dizziness, dysarthria or word finding difficulty, gait  disturbance or imbalance, tremors or seizure activity No problems swallowing food or Liquids, indigestion/reflux, choking or coughing while eating, abdominal pain with or after eating No Chest pain, Cough or Shortness of Breath, palpitations, orthopnea or DOE No Abdominal pain, N/V, melena,hematochezia, dark tarry stools, constipation No dysuria, malodorous urine, hematuria or flank pain No new skin rashes, lesions, masses or bruises, No new joint pains, aches, swelling or redness No recent unintentional weight gain or loss No polyuria, polydypsia or polyphagia   Past Medical History:  Diagnosis Date  . Anemia   . Arthritis    Right shoulder  . Chronic kidney disease   . Colon polyps   . Dialysis patient ([place])   . Elevated cholesterol   . Gastritis and gastroduodenitis 12/29/2014  . GERD (gastroesophageal reflux disease)   . Headache(784.0)   . Hx of adenomatous colonic polyps 01/04/2015  . Hyperlipemia   . Hypertension     Past Surgical History:  Procedure Laterality Date  . AV FISTULA PLACEMENT  02/02/2012   Procedure: ARTERIOVENOUS (AV) FISTULA CREATION;  Surgeon: Angelia Mould, MD;  Location: [facility] OR;  Service: Vascular;  Laterality: Left;  . AV FISTULA REPAIR     Angioplasty of fistula multiple times Left  . COLONOSCOPY WITH PROPOFOL N/A 12/29/2014   Procedure: COLONOSCOPY WITH PROPOFOL;  Surgeon: Gatha Mayer, MD;  Location: [facility] ENDOSCOPY;  Service: Endoscopy;  Laterality: N/A;  . ESOPHAGOGASTRODUODENOSCOPY (EGD) WITH PROPOFOL N/A 12/29/2014   Procedure: ESOPHAGOGASTRODUODENOSCOPY (EGD) WITH PROPOFOL;  Surgeon: Gatha Mayer, MD;  Location: [facility]  ENDOSCOPY;  Service: Endoscopy;  Laterality: N/A;  . FISTULOGRAM N/A 04/01/2012   Procedure: FISTULOGRAM;  Surgeon: Angelia Mould, MD;  Location: [facility] CATH LAB;  Service: Cardiovascular;  Laterality: N/A;  . INSERTION OF DIALYSIS CATHETER  02/02/2012   Procedure: INSERTION OF DIALYSIS CATHETER;  Surgeon: Angelia Mould, MD;  Location: [place];  Service: Vascular;  Laterality: Right;  Insertion of 23cm dialysis catheter in Right Internal Jugular   . TUBAL LIGATION  1980    Social History   Social History  . Marital status: Widowed    Spouse name: N/A  . Number of children: 7  . Years of education: N/A   Occupational History  . retired    Social History Main Topics  . Smoking status: Never Smoker  . Smokeless tobacco: Never Used  . Alcohol use No  . Drug use: No  . Sexual activity: Not on file   Other Topics Concern  . Not on file   Social History Narrative  . No narrative on file    Mobility: Mobilizes independently without assistive devices Work history: Not obtained   No Known Allergies  Family History  Problem Relation Age of Onset  . Hypertension Mother   . Cancer Sister     type unknown  . Cancer Brother     type unknown  . Hypertension Daughter      Prior to Admission medications   Medication Sig Start Date End Date Taking? Authorizing Provider  acetaminophen (TYLENOL) 500 MG tablet Take 500 mg by mouth every 6 (six) hours as needed for moderate pain or headache.    Historical Provider, MD  amLODipine (NORVASC) 10 MG tablet Take 1 tablet by mouth every evening.  10/14/14   Historical Provider, MD  atorvastatin (LIPITOR) 10 MG tablet Take 10 mg by mouth every evening.     Historical Provider, MD  b complex-vitamin c-folic acid (NEPHRO-VITE) 0.8 MG TABS Take 0.8 mg by mouth every morning.     Historical Provider, MD  dexlansoprazole (DEXILANT) 60 MG capsule Take 60 mg by mouth every evening.     Historical Provider, MD  metoprolol succinate (TOPROL-XL) 50 MG 24 hr tablet Take 100 mg by mouth.  01/03/12   Historical Provider, MD  RENVELA 800 MG tablet Take 2,400 mg by mouth 3 (three) times daily with meals.  01/15/12   Historical Provider, MD    Physical Exam: Vitals:   09/01/16 1500 09/01/16 1515 09/01/16 1530 09/01/16 1545  BP: 137/60 (!) 142/83 131/72 (!) 147/78    Pulse: 82 87 85 86  Resp: 20 (!) 26 (!) 23 20  Temp:      TempSrc:      SpO2: 96% 100% 97% 100%  Weight:      Height:          Constitutional: NAD, Restless and anxious writhing in bed complaining of feeling generalized weakness and asking for oxygen to be applied-it is noted her room air saturations 100%-also complaining of headache Eyes: PERRL, lids and conjunctivae normal ENMT: Mucous membranes are moist. Posterior pharynx clear of any exudate or lesions.Normal dentition.  Neck: normal, supple, no masses, no thyromegaly Respiratory: clear to auscultation bilaterally, no wheezing, no crackles. Normal respiratory effort. No accessory muscle use.  Cardiovascular: Regular rate and rhythm, no murmurs / rubs / gallops. No extremity edema. 2+ pedal pulses. No carotid bruits.  Abdomen: no tenderness, no masses palpated. No hepatosplenomegaly. Bowel sounds positive.  Musculoskeletal: no clubbing / cyanosis. No joint deformity  upper and lower extremities. Good ROM, no contractures. Normal muscle tone.  Skin: no rashes, lesions, ulcers. No induration Neurologic: CN 2-12 grossly intact. Sensation intact, DTR normal. Strength 5/5 x all 4 extremities.  Psychiatric: Normal judgment and insight. Alert and oriented x 3. Anxious and restless mood.    Labs on Admission: I have personally reviewed following labs and imaging studies  CBC:  Recent Labs Lab 09/01/16 1210 09/01/16 1217  WBC 11.4*  --   NEUTROABS 9.0*  --   HGB 7.6* 8.2*  HCT 23.5* 24.0*  MCV 83.6  --   PLT 200  --    Basic Metabolic Panel:  Recent Labs Lab 09/01/16 1210 09/01/16 1217  NA 138 138  K 3.3* 3.3*  CL 95* 97*  CO2 28  --   GLUCOSE 118* 118*  BUN 12 13  CREATININE 3.40* 3.50*  CALCIUM 9.2  --    GFR: Estimated Creatinine Clearance: 13.8 mL/min (A) (by C-G formula based on SCr of 3.5 mg/dL (H)). Liver Function Tests:  Recent Labs Lab 09/01/16 1210  AST 32  ALT 20  ALKPHOS 249*  BILITOT 0.5  PROT  8.2*  ALBUMIN 3.9   No results for input(s): LIPASE, AMYLASE in the last 168 hours. No results for input(s): AMMONIA in the last 168 hours. Coagulation Profile:  Recent Labs Lab 09/01/16 1210  INR 0.98   Cardiac Enzymes: No results for input(s): CKTOTAL, CKMB, CKMBINDEX, TROPONINI in the last 168 hours. BNP (last 3 results) No results for input(s): PROBNP in the last 8760 hours. HbA1C: No results for input(s): HGBA1C in the last 72 hours. CBG: No results for input(s): GLUCAP in the last 168 hours. Lipid Profile: No results for input(s): CHOL, HDL, LDLCALC, TRIG, CHOLHDL, LDLDIRECT in the last 72 hours. Thyroid Function Tests: No results for input(s): TSH, T4TOTAL, FREET4, T3FREE, THYROIDAB in the last 72 hours. Anemia Panel: No results for input(s): VITAMINB12, FOLATE, FERRITIN, TIBC, IRON, RETICCTPCT in the last 72 hours. Urine analysis:    Component Value Date/Time   COLORURINE STRAW (A) 11/07/2007 Ord 11/07/2007 1614   LABSPEC 1.007 11/07/2007 1614   PHURINE 5.5 11/07/2007 1614   GLUCOSEU NEGATIVE 11/07/2007 1614   HGBUR NEGATIVE 11/07/2007 1614   BILIRUBINUR NEGATIVE 11/07/2007 1614   KETONESUR NEGATIVE 11/07/2007 1614   PROTEINUR 100 (A) 11/07/2007 1614   UROBILINOGEN 0.2 11/07/2007 1614   NITRITE NEGATIVE 11/07/2007 1614   LEUKOCYTESUR LARGE (A) 11/07/2007 1614   Sepsis Labs: @LABRCNTIP (procalcitonin:4,lacticidven:4) )No results found for this or any previous visit (from the past 240 hour(s)).   Radiological Exams on Admission: Dg Chest Port 1 View  Result Date: 09/01/2016 CLINICAL DATA:  Weakness following recent dialysis EXAM: PORTABLE CHEST 1 VIEW COMPARISON:  03/05/2015 FINDINGS: The heart size and mediastinal contours are within normal limits. Both lungs are clear. The visualized skeletal structures are unremarkable. IMPRESSION: No active disease. Electronically Signed   By: Inez Catalina M.D.   On: 09/01/2016 15:48      Assessment/Plan Principal Problem:   General weakness -Presents from hemodialysis with generalized weakness for at least one week worse after dialysis treatment in setting of progressive/worsened anemia -Treat underlying causes which at this point appears to be related to symptomatic anemia (see below) -? Atypical cardiac ischemic presentation: Cycle troponin and obtain EKG -? Atypical presentation of infection:  UA/cx-Discuss with Dr. Mercy Moore who reports patient has had similar presentation in the past solely related to symptomatic anemia from lack of exogenous erythropoietin and  therefore he feels blood cultures are not indicated. It is also noted the patient is a difficult stick so we will cancel blood cultures -Check TSH -Obtain CK -OVS  Active Problems:   Symptomatic anemia -In review of outpatient hemodialysis records HemoCue hemoglobin has drifted downward as follows: (3/7) 13.5 > (3/14) 12.5 > (3/21) 11.4 > (3/28) 10.0 > (4/4) 8.2 > (4/11) 6.8 -Today hemoglobin here is 7.6 on CBC and 8.2 on HemoCue -In review of outpatient medications including medications typically given during hemodialysis patient does not appear to be on erythropoietin stimulating agent  **since admission Dr. Mercy Moore with nephrology has notified me that he suspects patient's progressive anemia solely related to lack of exogenous erythropoietin. This has happened in the past. He has opted to give her only one unit of blood and will readjust her erythropoietin dosage and will likely take her off the usual outpatient HD unit protocol -Erythropoietin level -Transfuse 2 units PRBCs each over 4 hrs **now changed to only 1 unit per nephrology recommendation -Anemia panel -CBC post transfusion and again in a.m.    CKD (chronic kidney disease) stage V requiring chronic dialysis  -Typical dialysis days are MWF and completed full session today -May require extra session tomorrow pending response to PRBCs     History of Gastritis and gastroduodenitis -Documented in 2016 -Recent utilization of NSAIDs (<1 WK) and heme negative stool and symptoms ongoing longer with current hemoglobin trend drifting down for at least 2 months -Follows with Dr. Carlean Purl -Continue Dexilant    HTN (hypertension) -Continue Norvasc and Toprol    HLD (hyperlipidemia) -Continue Lipitor      DVT prophylaxis: SCDs Code Status: Full Family Communication: Daughters at bedside Disposition Plan: Home Consults called: None but if develops volume overload posttransfusion may need to consult nephrology for dialysis on 4/14    ELLIS,ALLISON L. ANP-BC [facility] Pager [phone]   If 7PM-7AM, please contact night-coverage [email or web address] Password [email or web address]  09/01/2016, 3:52 PM

## 2016-09-01 NOTE — Progress Notes (Signed)
[]  Hover for attribution information Patient arrived on unit from ED via stretcher. Telemetry placed per MD order.  Daughter at bedside.

## 2016-09-02 DIAGNOSIS — D649 Anemia, unspecified: Secondary | ICD-10-CM | POA: Diagnosis not present

## 2016-09-02 DIAGNOSIS — R531 Weakness: Secondary | ICD-10-CM | POA: Diagnosis not present

## 2016-09-02 LAB — COMPREHENSIVE METABOLIC PANEL
ALT: 20 U/L (ref 14–54)
AST: 25 U/L (ref 15–41)
Albumin: 3.6 g/dL (ref 3.5–5.0)
Alkaline Phosphatase: 222 U/L — ABNORMAL HIGH (ref 38–126)
Anion gap: 12 (ref 5–15)
BUN: 28 mg/dL — ABNORMAL HIGH (ref 6–20)
CO2: 32 mmol/L (ref 22–32)
Calcium: 9.3 mg/dL (ref 8.9–10.3)
Chloride: 95 mmol/L — ABNORMAL LOW (ref 101–111)
Creatinine, Ser: 6.06 mg/dL — ABNORMAL HIGH (ref 0.44–1.00)
GFR calc Af Amer: 7 mL/min — ABNORMAL LOW (ref >60)
GFR calc non Af Amer: 6 mL/min — ABNORMAL LOW (ref >60)
Glucose, Bld: 102 mg/dL — ABNORMAL HIGH (ref 65–99)
Potassium: 4 mmol/L (ref 3.5–5.1)
Sodium: 139 mmol/L (ref 135–145)
Total Bilirubin: 0.8 mg/dL (ref 0.3–1.2)
Total Protein: 7.4 g/dL (ref 6.5–8.1)

## 2016-09-02 LAB — ERYTHROPOIETIN: Erythropoietin: 379.9 m[IU]/mL — ABNORMAL HIGH (ref 2.6–18.5)

## 2016-09-02 LAB — CBC
HCT: 26.3 % — ABNORMAL LOW (ref 36.0–46.0)
Hemoglobin: 8.4 g/dL — ABNORMAL LOW (ref 12.0–15.0)
MCH: 27.4 pg (ref 26.0–34.0)
MCHC: 31.9 g/dL (ref 30.0–36.0)
MCV: 85.7 fL (ref 78.0–100.0)
Platelets: 194 10*3/uL (ref 150–400)
RBC: 3.07 MIL/uL — ABNORMAL LOW (ref 3.87–5.11)
RDW: 16.8 % — ABNORMAL HIGH (ref 11.5–15.5)
WBC: 8.1 10*3/uL (ref 4.0–10.5)

## 2016-09-02 LAB — VITAMIN B12: Vitamin B-12: 1098 pg/mL — ABNORMAL HIGH (ref 180–914)

## 2016-09-02 NOTE — Discharge Summary (Signed)
Physician Discharge Summary  Alexa Dougherty HWT:[id number] DOB: [birth year] DOA: 09/01/2016  PCP: Bartholome Bill, MD  Admit date: 09/01/2016 Discharge date: 09/02/2016  Time spent: 35 minutes  Recommendations for Outpatient Follow-up:  1. ? Aranesp or EPO as OP 2. consider IV iron every couple of weeks at dialysis 3. Get Tsat/Iron levels in 2-4 weeks 4. Cbc in 1-2 weeks  Discharge Diagnoses:  Principal Problem:   General weakness Active Problems:   CKD (chronic kidney disease) stage V requiring chronic dialysis (HCC)   History of Gastritis and gastroduodenitis   Symptomatic anemia   HTN (hypertension)   HLD (hyperlipidemia)   Discharge Condition: fair  Diet recommendation: renal  Filed Weights   09/01/16 1148 09/02/16 0214  Weight: 79 kg (174 lb 2.6 oz) 79 kg (174 lb 2.6 oz)    History of present illness:  71 laotian Hypertension ESRD Monday Wednesday Friday since 9 2013 Osteoarthritis History of reflux Status post BTL Prior adenoma  8 on colonoscopy 12/2014 Dr. Minna Merritts noted pandiverticulosis internal hemorrhoids, EGD showed antral gastritis--report notable for endoscopy capsule being negative Anemia of renal disease and also probably secondary to chronic GI losses baseline hemoglobin 11 in 02/2015 but has been as low as 8   Readmitted with symptomatic anemia weakness and overall discomfort  Patient felt much better after transfusion  On day of d/c was back to herself-no n/v/cp/fever/chill /n/v and ate a full diet  B12 levels aspart of work up were actually high, troponin neg, alk phos slight high  Will need intermittent monitoring labs as above--Her hemoglobin went from admission level of 7.6--8.4 And she has had this type presentation in tthe past x 2  Discharge Exam: Vitals:   09/02/16 0535 09/02/16 0949  BP: (!) 117/53 (!) 121/56  Pulse: 71 69  Resp: 14 16  Temp: 98.6 F (37 C) 98.8 F (37.1 C)    General: alert pleasant in  nad Cardiovascular: s1 s2 no m/r/g Respiratory: clear no added sound abd soft nt nd no rebound  Discharge Instructions   Discharge Instructions    Diet - low sodium heart healthy    Complete by:  As directed    Discharge instructions    Complete by:  As directed    Need follow-up with Dr. Mercy Moore for plans re: blood producing medicine.  Needs also labs in about 1 week with dialysis Monitor stools-if dark let your primary Md know   Increase activity slowly    Complete by:  As directed      Current Discharge Medication List    CONTINUE these medications which have NOT CHANGED   Details  acetaminophen (TYLENOL) 500 MG tablet Take 500 mg by mouth every 6 (six) hours as needed for moderate pain or headache.    amLODipine (NORVASC) 10 MG tablet Take 1 tablet by mouth every evening.     atorvastatin (LIPITOR) 10 MG tablet Take 10 mg by mouth every evening.     b complex-vitamin c-folic acid (NEPHRO-VITE) 0.8 MG TABS Take 0.8 mg by mouth every morning.     cinacalcet (SENSIPAR) 30 MG tablet Take 30 mg by mouth at bedtime.    dexlansoprazole (DEXILANT) 60 MG capsule Take 60 mg by mouth every evening.     metoprolol succinate (TOPROL-XL) 100 MG 24 hr tablet Take 100 mg by mouth daily.    predniSONE (DELTASONE) 10 MG tablet Take 10-40 mg by mouth daily. 42m x 2 days  358mx 2 days 2025m 2 days 36m51m  1 day    RENVELA 800 MG tablet Take 2,400 mg by mouth 3 (three) times daily with meals.        No Known Allergies    The results of significant diagnostics from this hospitalization (including imaging, microbiology, ancillary and laboratory) are listed below for reference.    Significant Diagnostic Studies: Dg Chest Port 1 View  Result Date: 09/01/2016 CLINICAL DATA:  Weakness following recent dialysis EXAM: PORTABLE CHEST 1 VIEW COMPARISON:  03/05/2015 FINDINGS: The heart size and mediastinal contours are within normal limits. Both lungs are clear. The visualized skeletal  structures are unremarkable. IMPRESSION: No active disease. Electronically Signed   By: Inez Catalina M.D.   On: 09/01/2016 15:48    Microbiology: Recent Results (from the past 240 hour(s))  MRSA PCR Screening     Status: None   Collection Time: 09/01/16  5:45 PM  Result Value Ref Range Status   MRSA by PCR NEGATIVE NEGATIVE Final    Comment:        The GeneXpert MRSA Assay (FDA approved for NASAL specimens only), is one component of a comprehensive MRSA colonization surveillance program. It is not intended to diagnose MRSA infection nor to guide or monitor treatment for MRSA infections.      Labs: Basic Metabolic Panel:  Recent Labs Lab 09/01/16 1210 09/01/16 1217 09/01/16 1632 09/02/16 0347  NA 138 138  --  139  K 3.3* 3.3*  --  4.0  CL 95* 97*  --  95*  CO2 28  --   --  32  GLUCOSE 118* 118*  --  102*  BUN 12 13  --  28*  CREATININE 3.40* 3.50*  --  6.06*  CALCIUM 9.2  --   --  9.3  PHOS  --   --  2.9  --    Liver Function Tests:  Recent Labs Lab 09/01/16 1210 09/02/16 0347  AST 32 25  ALT 20 20  ALKPHOS 249* 222*  BILITOT 0.5 0.8  PROT 8.2* 7.4  ALBUMIN 3.9 3.6   No results for input(s): LIPASE, AMYLASE in the last 168 hours. No results for input(s): AMMONIA in the last 168 hours. CBC:  Recent Labs Lab 09/01/16 1210 09/01/16 1217 09/02/16 0347  WBC 11.4*  --  8.1  NEUTROABS 9.0*  --   --   HGB 7.6* 8.2* 8.4*  HCT 23.5* 24.0* 26.3*  MCV 83.6  --  85.7  PLT 200  --  194   Cardiac Enzymes:  Recent Labs Lab 09/01/16 1632  CKTOTAL 40  TROPONINI <0.03   BNP: BNP (last 3 results) No results for input(s): BNP in the last 8760 hours.  ProBNP (last 3 results) No results for input(s): PROBNP in the last 8760 hours.  CBG: No results for input(s): GLUCAP in the last 168 hours.     SignedNita Sells MD   [facility] 09/02/2016, 11:10 AM

## 2016-09-02 NOTE — Progress Notes (Signed)
Patient discharged home per MD. Discharge instructions provided to patient and daughter. Patient requested that daughter sign discharge. No prescriptions called in or provided at discharge. Patient escorted out via wheelchair and RN. Bartholomew Crews, RN

## 2016-09-04 LAB — FOLATE RBC
Folate, Hemolysate: 570.5 ng/mL
Folate, RBC: 2097 ng/mL (ref >498)
Hematocrit: 27.2 % — ABNORMAL LOW (ref 34.0–46.6)

## 2016-09-05 LAB — TYPE AND SCREEN
ABO/RH(D): A POS
Antibody Screen: NEGATIVE
Unit division: 0
Unit division: 0

## 2016-09-05 LAB — BPAM RBC
Blood Product Expiration Date: 201804222359
Blood Product Expiration Date: 201804222359
ISSUE DATE / TIME: 201804132121
Unit Type and Rh: 6200
Unit Type and Rh: 6200

## 2016-09-05 LAB — VITAMIN B6: Vitamin B6: 16 ug/L (ref 2.0–32.8)

## 2016-09-06 LAB — VITAMIN B1: Vitamin B1 (Thiamine): 114.4 nmol/L (ref 66.5–200.0)

## 2016-09-15 ENCOUNTER — Ambulatory Visit: Payer: Medicare Other | Admitting: Vascular Surgery

## 2016-10-06 ENCOUNTER — Encounter: Payer: Self-pay | Admitting: Family Medicine

## 2017-04-23 ENCOUNTER — Encounter (HOSPITAL_COMMUNITY): Payer: Self-pay | Admitting: Emergency Medicine

## 2017-04-23 ENCOUNTER — Emergency Department (HOSPITAL_COMMUNITY)
Admission: EM | Admit: 2017-04-23 | Discharge: 2017-04-23 | Disposition: A | Discharge status: Home / Self Care | Payer: Medicare Other | Attending: Emergency Medicine | Admitting: Emergency Medicine

## 2017-04-23 ENCOUNTER — Other Ambulatory Visit: Payer: Self-pay

## 2017-04-23 DIAGNOSIS — I1311 Hypertensive heart and chronic kidney disease without heart failure, with stage 5 chronic kidney disease, or end stage renal disease: Secondary | ICD-10-CM | POA: Insufficient documentation

## 2017-04-23 DIAGNOSIS — T82838A Hemorrhage of vascular prosthetic devices, implants and grafts, initial encounter: Secondary | ICD-10-CM | POA: Diagnosis present

## 2017-04-23 DIAGNOSIS — Z992 Dependence on renal dialysis: Secondary | ICD-10-CM | POA: Diagnosis not present

## 2017-04-23 DIAGNOSIS — N185 Chronic kidney disease, stage 5: Secondary | ICD-10-CM | POA: Insufficient documentation

## 2017-04-23 DIAGNOSIS — Y828 Other medical devices associated with adverse incidents: Secondary | ICD-10-CM | POA: Insufficient documentation

## 2017-04-23 DIAGNOSIS — Z79899 Other long term (current) drug therapy: Secondary | ICD-10-CM | POA: Insufficient documentation

## 2017-04-23 LAB — CBC
HCT: 37.9 % (ref 36.0–46.0)
Hemoglobin: 12.1 g/dL (ref 12.0–15.0)
MCH: 27.1 pg (ref 26.0–34.0)
MCHC: 31.9 g/dL (ref 30.0–36.0)
MCV: 84.8 fL (ref 78.0–100.0)
Platelets: 200 10*3/uL (ref 150–400)
RBC: 4.47 MIL/uL (ref 3.87–5.11)
RDW: 16.6 % — ABNORMAL HIGH (ref 11.5–15.5)
WBC: 6.4 10*3/uL (ref 4.0–10.5)

## 2017-04-23 LAB — BASIC METABOLIC PANEL
Anion gap: 15 (ref 5–15)
BUN: 23 mg/dL — ABNORMAL HIGH (ref 6–20)
CO2: 27 mmol/L (ref 22–32)
Calcium: 9.5 mg/dL (ref 8.9–10.3)
Chloride: 95 mmol/L — ABNORMAL LOW (ref 101–111)
Creatinine, Ser: 5.18 mg/dL — ABNORMAL HIGH (ref 0.44–1.00)
GFR calc Af Amer: 9 mL/min — ABNORMAL LOW (ref >60)
GFR calc non Af Amer: 8 mL/min — ABNORMAL LOW (ref >60)
Glucose, Bld: 96 mg/dL (ref 65–99)
Potassium: 3.9 mmol/L (ref 3.5–5.1)
Sodium: 137 mmol/L (ref 135–145)

## 2017-04-23 NOTE — ED Triage Notes (Signed)
Patient from dialysis after facility reports having difficulty stopping bleeding of graft after complete treatment.  EMS reports graft site was continued to bleed when pressure was removed for 90 minutes after stopping treatment. Bleeding controlled at this time.  Patient states she feels more weak than she normally does after dialysis. Patient hypertensive with pressure 200/85, states on dialysis days she takes her antihypertensive medication after dialysis. She has not taken this medication yet today.  Patient is alert and oriented at this time.

## 2017-04-23 NOTE — ED Notes (Signed)
No answer when called for re-check vitals at 15:41.

## 2017-04-23 NOTE — ED Notes (Signed)
No active bleeding in fistula at this time

## 2017-04-23 NOTE — ED Notes (Signed)
ED Provider at bedside. 

## 2017-04-23 NOTE — Discharge Instructions (Signed)
Follow-up with your vascular surgeon if the graft continues to have worsening bleeding episodes.

## 2017-04-23 NOTE — ED Provider Notes (Signed)
[facility] EMERGENCY DEPARTMENT Provider Note   CSN: [id number] Arrival date & time: 04/23/17  1215     History   Chief Complaint No chief complaint on file.   HPI Alexa Dougherty is a 71 y.o. female.  HPI Patient is a dialysis patient.  She was dialyzed today and had bleeding from graft after it.  This is not unusual for her.  States that often last 45 minutes but today lasted 90 minutes.  States it was bleeding briskly.  No chest pain but did feel little lightheaded.  No fevers or chills.  States she does tend to bleed easily. Past Medical History:  Diagnosis Date  . Anemia   . Arthritis    Right shoulder  . Chronic kidney disease   . Colon polyps   . Dialysis patient ([place])   . Elevated cholesterol   . Gastritis and gastroduodenitis 12/29/2014  . GERD (gastroesophageal reflux disease)   . Headache(784.0)   . Hx of adenomatous colonic polyps 01/04/2015  . Hyperlipemia   . Hypertension     Patient Active Problem List   Diagnosis Date Noted  . General weakness 09/01/2016  . Symptomatic anemia 09/01/2016  . HTN (hypertension) 09/01/2016  . HLD (hyperlipidemia) 09/01/2016  . Hx of adenomatous colonic polyps 01/04/2015  . History of Gastritis and gastroduodenitis 12/29/2014  . CKD (chronic kidney disease) stage V requiring chronic dialysis ([place]) 01/24/2012    Past Surgical History:  Procedure Laterality Date  . AV FISTULA PLACEMENT  02/02/2012   Procedure: ARTERIOVENOUS (AV) FISTULA CREATION;  Surgeon: Angelia Mould, MD;  Location: [facility] OR;  Service: Vascular;  Laterality: Left;  . AV FISTULA REPAIR     Angioplasty of fistula multiple times Left  . COLONOSCOPY WITH PROPOFOL N/A 12/29/2014   Procedure: COLONOSCOPY WITH PROPOFOL;  Surgeon: Gatha Mayer, MD;  Location: [facility] ENDOSCOPY;  Service: Endoscopy;  Laterality: N/A;  . ESOPHAGOGASTRODUODENOSCOPY (EGD) WITH PROPOFOL N/A 12/29/2014   Procedure: ESOPHAGOGASTRODUODENOSCOPY (EGD) WITH PROPOFOL;   Surgeon: Gatha Mayer, MD;  Location: [facility] ENDOSCOPY;  Service: Endoscopy;  Laterality: N/A;  . FISTULOGRAM N/A 04/01/2012   Procedure: FISTULOGRAM;  Surgeon: Angelia Mould, MD;  Location: [facility] CATH LAB;  Service: Cardiovascular;  Laterality: N/A;  . INSERTION OF DIALYSIS CATHETER  02/02/2012   Procedure: INSERTION OF DIALYSIS CATHETER;  Surgeon: Angelia Mould, MD;  Location: [facility];  Service: Vascular;  Laterality: Right;  Insertion of 23cm dialysis catheter in Right Internal Jugular   . TUBAL LIGATION  1980    OB History    No data available       Home Medications    Prior to Admission medications   Medication Sig Start Date End Date Taking? Authorizing Provider  acetaminophen (TYLENOL) 500 MG tablet Take 500 mg by mouth every 6 (six) hours as needed for moderate pain or headache.    [provider]  amLODipine (NORVASC) 10 MG tablet Take 1 tablet by mouth every evening.  10/14/14   [provider]  atorvastatin (LIPITOR) 10 MG tablet Take 10 mg by mouth every evening.     [provider]  b complex-vitamin c-folic acid (NEPHRO-VITE) 0.8 MG TABS Take 0.8 mg by mouth every morning.     [provider]  cinacalcet (SENSIPAR) 30 MG tablet Take 30 mg by mouth at bedtime.    [provider]  dexlansoprazole (DEXILANT) 60 MG capsule Take 60 mg by mouth every evening.     [provider]  metoprolol succinate (TOPROL-XL) 100 MG 24 hr tablet Take 100 mg by mouth daily. 08/18/16   [provider]  predniSONE (DELTASONE) 10 MG tablet Take 10-40 mg by mouth daily. 40mg  x 2 days  30mg  x 2 days 20mg  x 2 days 10mg  x 1 day 08/31/16   [provider]  RENVELA 800 MG tablet Take 2,400 mg by mouth 3 (three) times daily with meals.  01/15/12   [provider]    Family History Family History  Problem Relation Age of Onset  . Hypertension Mother   . Cancer Sister        type unknown  . Cancer Brother         type unknown  . Hypertension Daughter     Social History Social History   Tobacco Use  . Smoking status: Never Smoker  . Smokeless tobacco: Never Used  Substance Use Topics  . Alcohol use: No  . Drug use: No     Allergies   Patient has no known allergies.   Review of Systems Review of Systems  Constitutional: Positive for fatigue.  HENT: Negative for congestion.   Respiratory: Negative for shortness of breath.   Cardiovascular: Negative for chest pain.  Gastrointestinal: Negative for abdominal pain.  Musculoskeletal: Negative for back pain.  Hematological: Bruises/bleeds easily.     Physical Exam Updated Vital Signs BP (!) 200/85 (BP Location: Right Arm)   Pulse 69   Temp 98.8 F (37.1 C) (Oral)   Resp 16   SpO2 98%   Physical Exam  Constitutional: She appears well-developed.  HENT:  Head: Normocephalic.  Cardiovascular: Normal rate.  Pulmonary/Chest: Effort normal.  Musculoskeletal:  Dialysis graft or shunt in left upper arm.  Initial dressing removed and not actively bleeding at this time.  Good pulse and thrill.  Wrapped with 4 x 4 and Ace bandage but somewhat loose.  Neurovascular intact in left hand.  Skin: Skin is warm.     ED Treatments / Results  Labs (all labs ordered are listed, but only abnormal results are displayed) Labs Reviewed  BASIC METABOLIC PANEL - Abnormal; Notable for the following components:      Result Value   Chloride 95 (*)    BUN 23 (*)    Creatinine, Ser 5.18 (*)    GFR calc non Af Amer 8 (*)    GFR calc Af Amer 9 (*)    All other components within normal limits  CBC - Abnormal; Notable for the following components:   RDW 16.6 (*)    All other components within normal limits    EKG  EKG Interpretation None       Radiology No results found.  Procedures Procedures (including critical care time)  Medications Ordered in ED Medications - No data to display   Initial Impression / Assessment and Plan / ED  Course  I have reviewed the triage vital signs and the nursing notes.  Pertinent labs & imaging results that were available during my care of the patient were reviewed by me and considered in my medical decision making (see chart for details).     Bleeding from dialysis graft now resolved.  Hemoglobin reassuring.  Discharged home to follow-up with nephrology or vascular as needed.  Final Clinical Impressions(s) / ED Diagnoses   Final diagnoses:  Bleeding from dialysis shunt, initial encounter [facility])    ED Discharge Orders    None       Davonna Belling, MD 04/23/17 1742

## 2017-05-03 IMAGING — CT CT ABD-PELV W/O CM
2 of 4 series · 16 of 46 positions shown, 18 images · non-contrast
Comparison: None.

CLINICAL DATA: Mid epigastric pain for 1 week. Abdominal pain. No
IV contrast material due to elevated labs.

EXAM:
CT ABDOMEN AND PELVIS WITHOUT CONTRAST
TECHNIQUE: Multidetector CT imaging of the abdomen and pelvis was performed
following the standard protocol without IV contrast.

[Series 2: a/p w/o 5mm · axial · non-contrast · 0.88mm/px · z∈[+488,+943]mm · 13 of 101 slices shown, 15 images]
[im 5/101  soft-tissue]
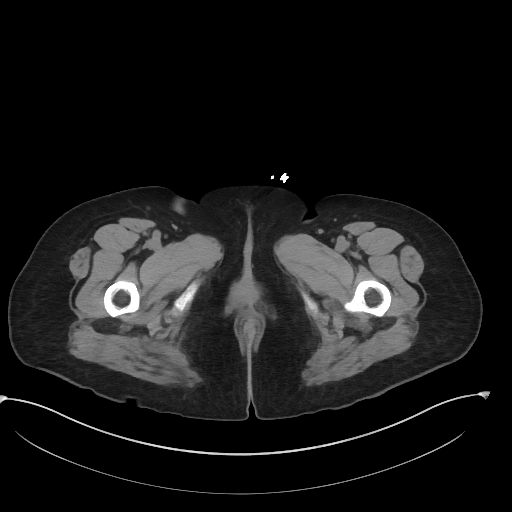
[im 5/101  bone]
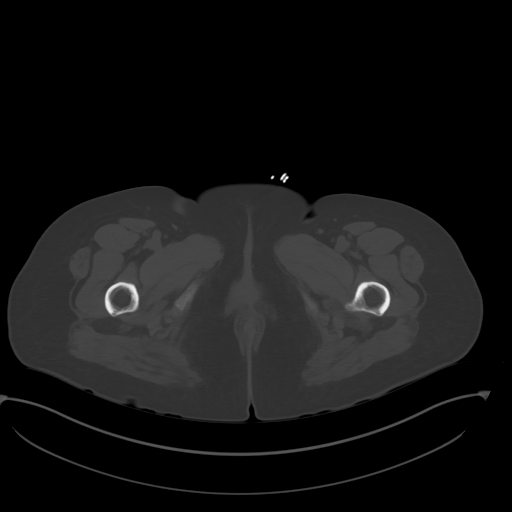
[im 14/101  soft-tissue]
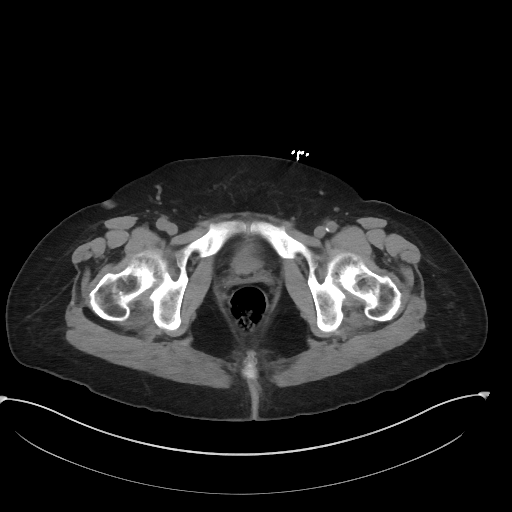
[im 22/101  soft-tissue]
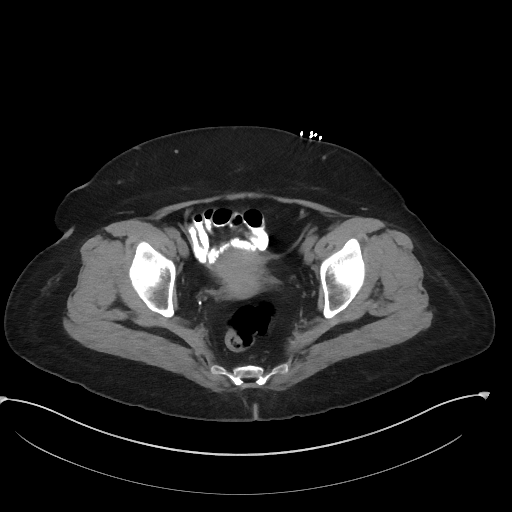
[im 27/101  soft-tissue]
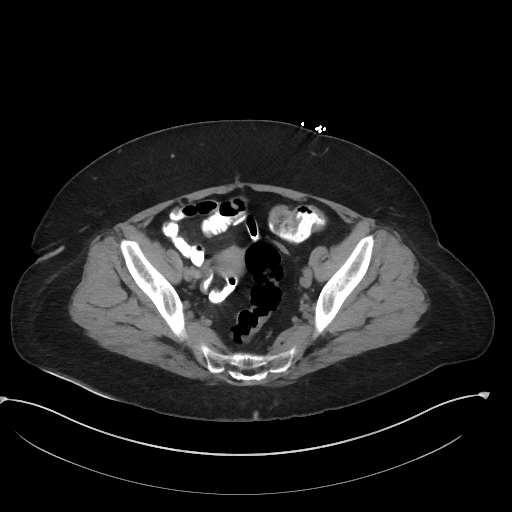
[im 35/101  soft-tissue]
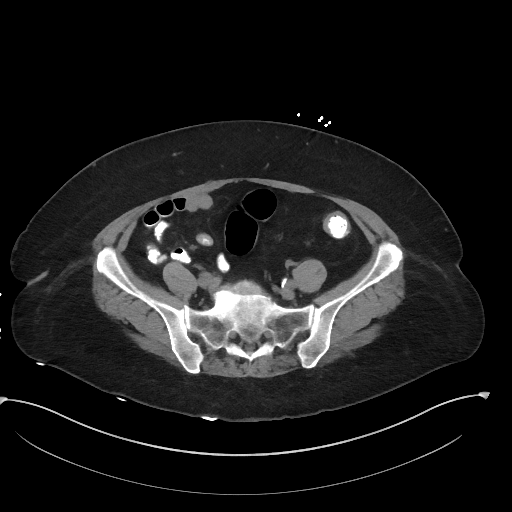
[im 44/101  soft-tissue]
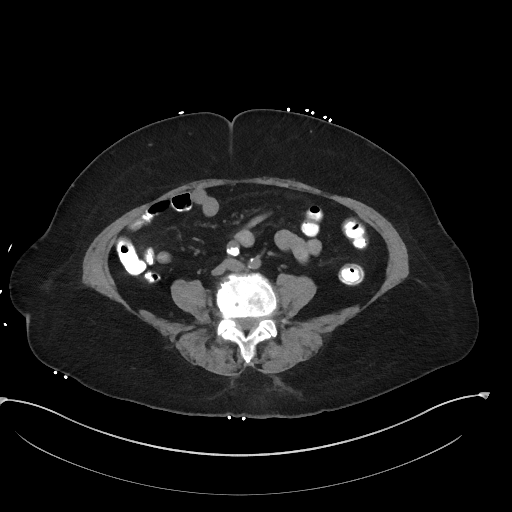
[im 53/101  soft-tissue]
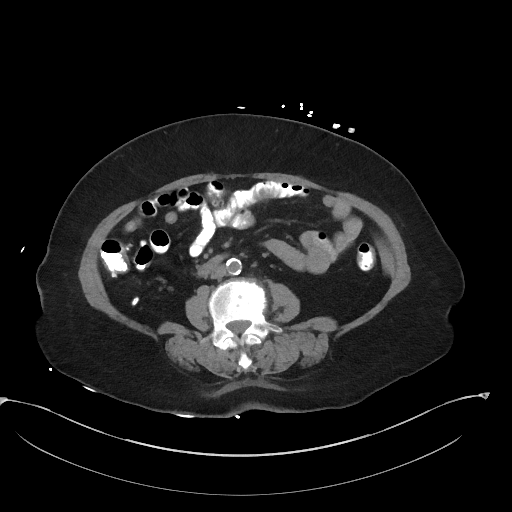
[im 57/101  soft-tissue]
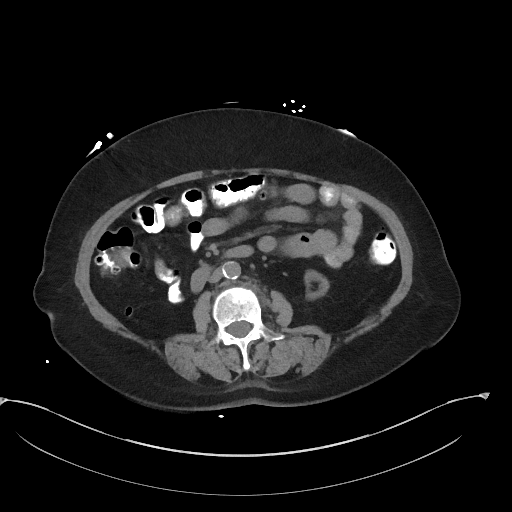
[im 66/101  soft-tissue]
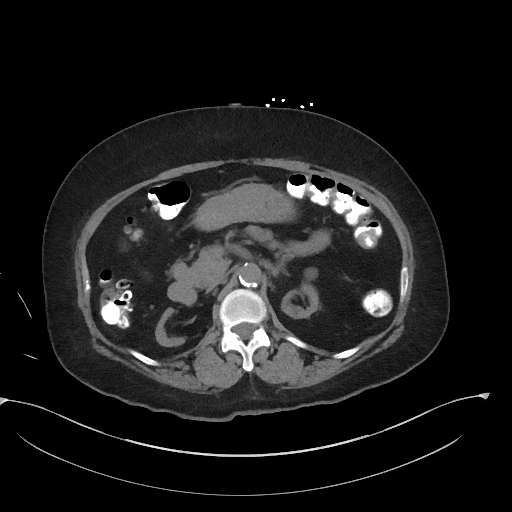
[im 66/101  bone]
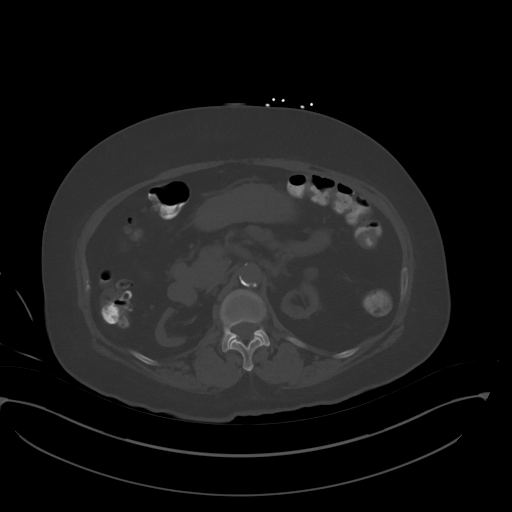
[im 74/101  soft-tissue]
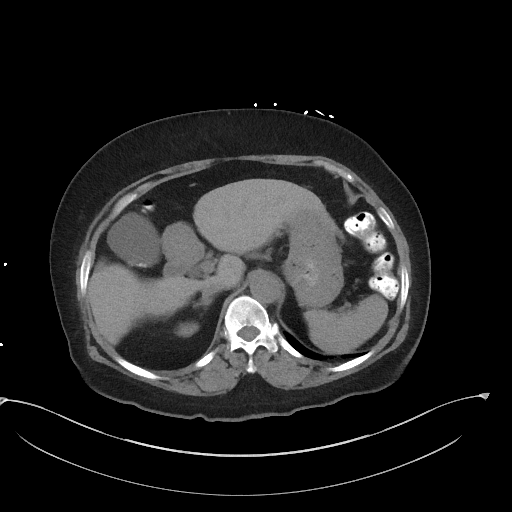
[im 79/101  soft-tissue]
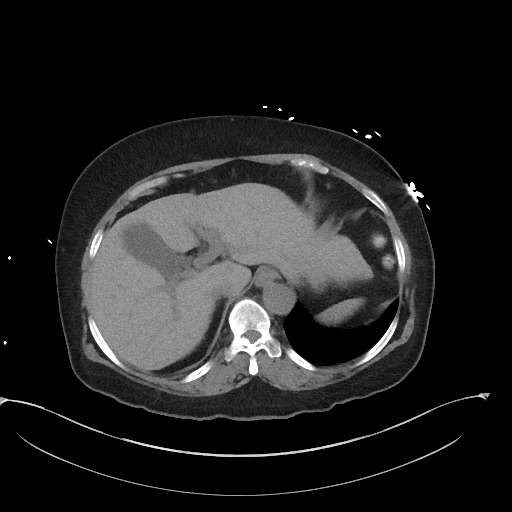
[im 87/101  soft-tissue]
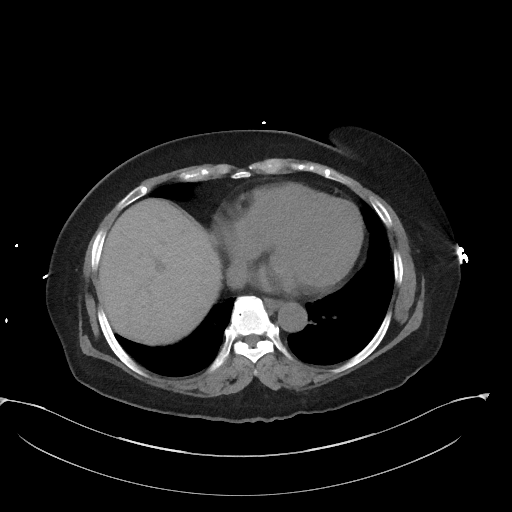
[im 96/101  soft-tissue]
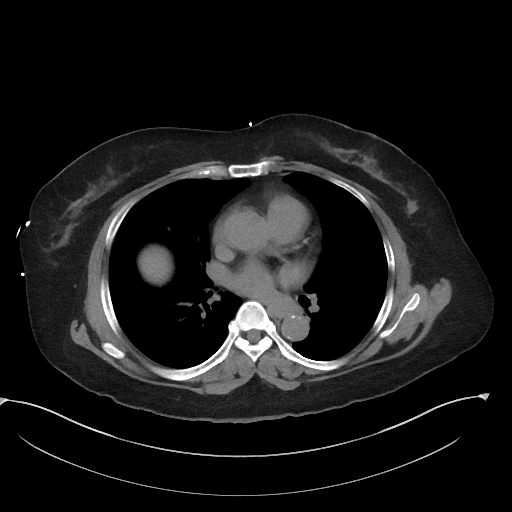

[Series 5: a/p w/o cor · coronal · non-contrast · 0.78mm/px · 3 of 146 slices shown]
[im 49/146  soft-tissue]
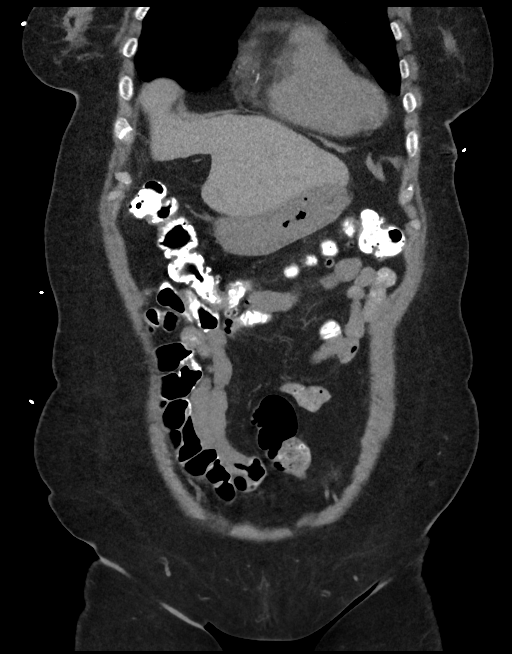
[im 65/146  soft-tissue]
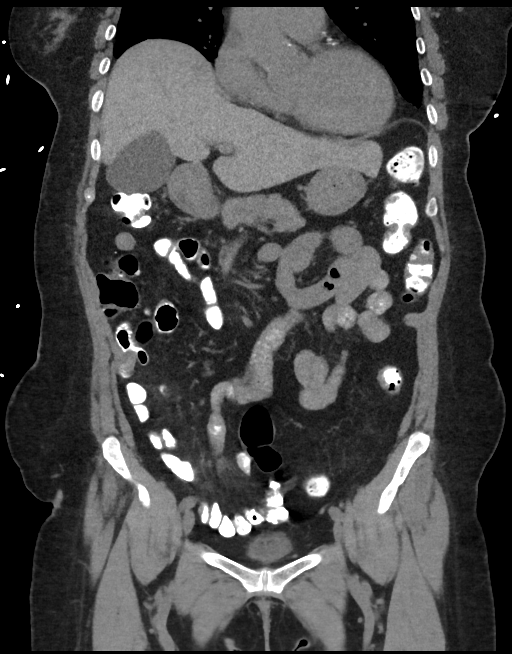
[im 81/146  soft-tissue]
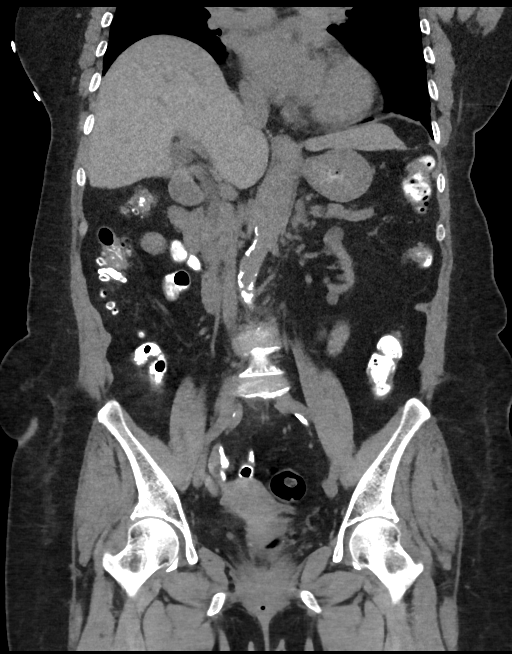

[16 of 46 positions shown; findings below may reference images not displayed]

FINDINGS: Mild dependent atelectasis in the lung bases. Calcified right hilar
lymph node. Coronary artery calcifications.

Cholelithiasis without gallbladder wall thickening or inflammatory
change. No bile duct dilatation. Unenhanced appearance of the liver,
spleen, pancreas, adrenal glands, inferior vena cava, and
retroperitoneal lymph nodes is unremarkable. Calcification of
abdominal aorta without aneurysm. Bilateral renal atrophy. Small
cysts on both kidneys. No hydronephrosis. No hydroureter. Stomach
and small bowel are decompressed. Contrast material flows through to
the colon without evidence of bowel obstruction. Scattered
diverticula throughout the colon. No colonic wall thickening or
distention. No free air or free fluid in the abdomen.

Pelvis: The appendix is normal. Uterus and ovaries are not enlarged.
Bladder is decompressed without evidence of bladder wall thickening.
No free or loculated pelvic fluid collections. No pelvic mass or
lymphadenopathy. Degenerative changes and Schmorl's nodes in the
spine. No destructive bone lesions.
IMPRESSION: Bilateral renal atrophy. No acute process demonstrated in the
abdomen or pelvis. No evidence of bowel obstruction or inflammation.
Diverticulosis coli.

## 2018-02-27 ENCOUNTER — Ambulatory Visit: Payer: Medicare Other | Admitting: Vascular Surgery

## 2018-03-12 ENCOUNTER — Other Ambulatory Visit: Payer: Self-pay

## 2018-03-12 ENCOUNTER — Ambulatory Visit (INDEPENDENT_AMBULATORY_CARE_PROVIDER_SITE_OTHER): Payer: Medicare Other | Admitting: Vascular Surgery

## 2018-03-12 ENCOUNTER — Encounter: Payer: Self-pay | Admitting: Vascular Surgery

## 2018-03-12 VITALS — BP 152/78 | HR 71 | Temp 97.7°F | Resp 14 | Ht 60.0 in | Wt 161.0 lb

## 2018-03-12 DIAGNOSIS — Z992 Dependence on renal dialysis: Secondary | ICD-10-CM | POA: Diagnosis not present

## 2018-03-12 DIAGNOSIS — N186 End stage renal disease: Secondary | ICD-10-CM | POA: Diagnosis not present

## 2018-03-12 NOTE — Progress Notes (Signed)
Vascular and Vein Specialist of [facility]  Patient name: Alexa Dougherty MRN: [id number] DOB: [birth year] Sex: female  REASON FOR VISIT: Evaluation of right upper arm AV fistula  HPI: Alexa Dougherty is a 72 y.o. female here today for evaluation of right upper arm AV fistula.  She is here today with her daughter.  Does spoke some English but her daughter helps with the history as well.  She is seen for evaluation of potential thinning of the skin in her left upper arm.  She had this a fistula placed in 2013 and is had excellent use of it for 6 years.  She did undergo recent intervention with Dr. Augustin Coupe at [facility] and I have this report as well.  She did have some angioplasty of the outflow cephalic vein.  The patient does have some area of thinning in the midportion of the fistula and reports that as long as the technicians access the fistula around the periphery of this area as she directs them she has no trouble with bleeding.  She has not had any history of ulceration or breakdown over the fistula or major bleeds.  She reports that she is having adequate access flow.  Past Medical History:  Diagnosis Date  . Anemia   . Arthritis    Right shoulder  . Chronic kidney disease   . Colon polyps   . Dialysis patient ([place])   . Elevated cholesterol   . Gastritis and gastroduodenitis 12/29/2014  . GERD (gastroesophageal reflux disease)   . Headache(784.0)   . Hx of adenomatous colonic polyps 01/04/2015  . Hyperlipemia   . Hypertension     Family History  Problem Relation Age of Onset  . Hypertension Mother   . Cancer Sister        type unknown  . Cancer Brother        type unknown  . Hypertension Daughter     SOCIAL HISTORY: Social History   Tobacco Use  . Smoking status: Never Smoker  . Smokeless tobacco: Never Used  Substance Use Topics  . Alcohol use: No    No Known Allergies  Current Outpatient Medications  Medication Sig Dispense  Refill  . acetaminophen (TYLENOL) 500 MG tablet Take 500 mg by mouth every 6 (six) hours as needed for moderate pain or headache.    Marland Kitchen amLODipine (NORVASC) 10 MG tablet Take 1 tablet by mouth every evening.     Marland Kitchen atorvastatin (LIPITOR) 10 MG tablet Take 10 mg by mouth every evening.     Marland Kitchen b complex-vitamin c-folic acid (NEPHRO-VITE) 0.8 MG TABS Take 0.8 mg by mouth every morning.     . cinacalcet (SENSIPAR) 30 MG tablet Take 30 mg by mouth at bedtime.    Marland Kitchen dexlansoprazole (DEXILANT) 60 MG capsule Take 60 mg by mouth every evening.     . metoprolol succinate (TOPROL-XL) 100 MG 24 hr tablet Take 100 mg by mouth daily.    . predniSONE (DELTASONE) 10 MG tablet Take 10-40 mg by mouth daily. 40mg  x 2 days  30mg  x 2 days 20mg  x 2 days 10mg  x 1 day    . RENVELA 800 MG tablet Take 2,400 mg by mouth 3 (three) times daily with meals.      No current facility-administered medications for this visit.     REVIEW OF SYSTEMS:  [X]  denotes positive finding, [ ]  denotes negative finding Cardiac  Comments:  Chest pain or chest pressure:    Shortness of breath upon exertion:  Short of breath when lying flat:    Irregular heart rhythm:        Vascular    Pain in calf, thigh, or hip brought on by ambulation:    Pain in feet at night that wakes you up from your sleep:     Blood clot in your veins:    Leg swelling:           PHYSICAL EXAM: Vitals:   03/12/18 1011  BP: (!) 152/78  Pulse: 71  Resp: 14  Temp: 97.7 F (36.5 C)  TempSrc: Oral  SpO2: 98%  Weight: 161 lb (73 kg)  Height: 5' (1.524 m)    GENERAL: The patient is a well-nourished female, in no acute distress. The vital signs are documented above. CARDIOVASCULAR: Upper arm AV fistula with a great deal of scarring and some keloid formation.  There are 2 areas in the midportion of the access area with loss of pigmentation and some thinning of the skin.  There is no ulceration or skin breakdown.  There is excellent thrill PULMONARY:  There is good air exchange  MUSCULOSKELETAL: There are no major deformities or cyanosis. NEUROLOGIC: No focal weakness or paresthesias are detected. SKIN: There are no ulcers or rashes noted. PSYCHIATRIC: The patient has a normal affect.  DATA:  Recent shuntogram from [facility] reviewed as above  MEDICAL ISSUES: A long discussion with the patient and her daughter.  I do not see any way to revise this fistula without completely abandoning the fistula.  There would be no option for plication or other revision.  He has had excellent long-term result of this and continues to have a good result as long as the technicians are avoiding the central portion with the thinning of skin.  I explained that her next option would be a completely new access possibly the right arm fistula or left arm graft.  Would recommend continuing to use this fistula as long as possible and they are comfortable with this decision.  I did explain what to look for regarding skin breakdown and first-aid if she should have any significant bleeding from this.  She will continue use of her fistula and will see [place] on an as-needed basis    Rosetta Posner, MD [facility] Vascular and Vein Specialists of [facility] Tel [phone] Pager [phone]

## 2018-03-14 ENCOUNTER — Encounter: Payer: Self-pay | Admitting: Nephrology

## 2018-04-25 ENCOUNTER — Encounter: Payer: Self-pay | Admitting: Internal Medicine

## 2018-09-08 ENCOUNTER — Inpatient Hospital Stay (HOSPITAL_COMMUNITY)
Admission: EM | Admit: 2018-09-08 | Discharge: 2018-09-13 | DRG: 682 | Disposition: A | Discharge status: Home Health | Payer: Medicare Other | Attending: Family Medicine | Admitting: Family Medicine

## 2018-09-08 DIAGNOSIS — E785 Hyperlipidemia, unspecified: Secondary | ICD-10-CM | POA: Diagnosis present

## 2018-09-08 DIAGNOSIS — Z8249 Family history of ischemic heart disease and other diseases of the circulatory system: Secondary | ICD-10-CM

## 2018-09-08 DIAGNOSIS — D631 Anemia in chronic kidney disease: Secondary | ICD-10-CM | POA: Diagnosis present

## 2018-09-08 DIAGNOSIS — I959 Hypotension, unspecified: Secondary | ICD-10-CM | POA: Diagnosis present

## 2018-09-08 DIAGNOSIS — Z20828 Contact with and (suspected) exposure to other viral communicable diseases: Secondary | ICD-10-CM | POA: Diagnosis present

## 2018-09-08 DIAGNOSIS — J189 Pneumonia, unspecified organism: Secondary | ICD-10-CM | POA: Diagnosis present

## 2018-09-08 DIAGNOSIS — K219 Gastro-esophageal reflux disease without esophagitis: Secondary | ICD-10-CM | POA: Diagnosis present

## 2018-09-08 DIAGNOSIS — Z992 Dependence on renal dialysis: Secondary | ICD-10-CM

## 2018-09-08 DIAGNOSIS — R531 Weakness: Secondary | ICD-10-CM | POA: Diagnosis present

## 2018-09-08 DIAGNOSIS — I161 Hypertensive emergency: Secondary | ICD-10-CM | POA: Diagnosis not present

## 2018-09-08 DIAGNOSIS — I1311 Hypertensive heart and chronic kidney disease without heart failure, with stage 5 chronic kidney disease, or end stage renal disease: Secondary | ICD-10-CM | POA: Diagnosis not present

## 2018-09-08 DIAGNOSIS — Z86010 Personal history of colonic polyps: Secondary | ICD-10-CM

## 2018-09-08 DIAGNOSIS — M19011 Primary osteoarthritis, right shoulder: Secondary | ICD-10-CM | POA: Diagnosis present

## 2018-09-08 DIAGNOSIS — Z79899 Other long term (current) drug therapy: Secondary | ICD-10-CM

## 2018-09-08 DIAGNOSIS — E875 Hyperkalemia: Secondary | ICD-10-CM | POA: Diagnosis present

## 2018-09-08 DIAGNOSIS — F419 Anxiety disorder, unspecified: Secondary | ICD-10-CM | POA: Diagnosis present

## 2018-09-08 DIAGNOSIS — E8770 Fluid overload, unspecified: Secondary | ICD-10-CM | POA: Diagnosis present

## 2018-09-08 DIAGNOSIS — Z8719 Personal history of other diseases of the digestive system: Secondary | ICD-10-CM

## 2018-09-08 DIAGNOSIS — J9601 Acute respiratory failure with hypoxia: Secondary | ICD-10-CM

## 2018-09-08 DIAGNOSIS — D649 Anemia, unspecified: Secondary | ICD-10-CM | POA: Diagnosis present

## 2018-09-08 DIAGNOSIS — I501 Left ventricular failure: Secondary | ICD-10-CM

## 2018-09-08 DIAGNOSIS — N186 End stage renal disease: Secondary | ICD-10-CM | POA: Diagnosis present

## 2018-09-08 DIAGNOSIS — E871 Hypo-osmolality and hyponatremia: Secondary | ICD-10-CM | POA: Diagnosis present

## 2018-09-08 DIAGNOSIS — R51 Headache: Secondary | ICD-10-CM | POA: Diagnosis present

## 2018-09-08 MED ORDER — NITROGLYCERIN IN D5W 200-5 MCG/ML-% IV SOLN
INTRAVENOUS | Status: AC
  Administered 2018-09-09: 10 ug/min
  Filled 2018-09-08: qty 250

## 2018-09-08 MED ORDER — NITROGLYCERIN 0.4 MG SL SUBL
SUBLINGUAL_TABLET | SUBLINGUAL | Status: AC
  Administered 2018-09-09
  Filled 2018-09-08: qty 1

## 2018-09-08 NOTE — ED Triage Notes (Signed)
Pt present to ED by EMS c/o SOB and headache. Per EMS rails in all fields. Per EMS pt is from home w/ sudden onset of SOB at 2300. Hx of dialysis, goes normally (m/w/f), recent change in her dose in HTN meds.

## 2018-09-09 ENCOUNTER — Emergency Department (HOSPITAL_COMMUNITY): Payer: Medicare Other

## 2018-09-09 ENCOUNTER — Inpatient Hospital Stay (HOSPITAL_COMMUNITY): Payer: Medicare Other

## 2018-09-09 ENCOUNTER — Encounter (HOSPITAL_COMMUNITY): Payer: Self-pay | Admitting: Nephrology

## 2018-09-09 DIAGNOSIS — I1311 Hypertensive heart and chronic kidney disease without heart failure, with stage 5 chronic kidney disease, or end stage renal disease: Secondary | ICD-10-CM | POA: Diagnosis present

## 2018-09-09 DIAGNOSIS — F419 Anxiety disorder, unspecified: Secondary | ICD-10-CM | POA: Diagnosis present

## 2018-09-09 DIAGNOSIS — Z992 Dependence on renal dialysis: Secondary | ICD-10-CM | POA: Diagnosis not present

## 2018-09-09 DIAGNOSIS — Z20828 Contact with and (suspected) exposure to other viral communicable diseases: Secondary | ICD-10-CM | POA: Diagnosis present

## 2018-09-09 DIAGNOSIS — Z86010 Personal history of colonic polyps: Secondary | ICD-10-CM | POA: Diagnosis not present

## 2018-09-09 DIAGNOSIS — Z8249 Family history of ischemic heart disease and other diseases of the circulatory system: Secondary | ICD-10-CM | POA: Diagnosis not present

## 2018-09-09 DIAGNOSIS — Z8719 Personal history of other diseases of the digestive system: Secondary | ICD-10-CM | POA: Diagnosis not present

## 2018-09-09 DIAGNOSIS — I5021 Acute systolic (congestive) heart failure: Secondary | ICD-10-CM | POA: Diagnosis not present

## 2018-09-09 DIAGNOSIS — I501 Left ventricular failure: Secondary | ICD-10-CM | POA: Diagnosis not present

## 2018-09-09 DIAGNOSIS — D631 Anemia in chronic kidney disease: Secondary | ICD-10-CM | POA: Diagnosis present

## 2018-09-09 DIAGNOSIS — Z79899 Other long term (current) drug therapy: Secondary | ICD-10-CM | POA: Diagnosis not present

## 2018-09-09 DIAGNOSIS — J9601 Acute respiratory failure with hypoxia: Secondary | ICD-10-CM

## 2018-09-09 DIAGNOSIS — E875 Hyperkalemia: Secondary | ICD-10-CM | POA: Diagnosis present

## 2018-09-09 DIAGNOSIS — E785 Hyperlipidemia, unspecified: Secondary | ICD-10-CM | POA: Diagnosis present

## 2018-09-09 DIAGNOSIS — R0600 Dyspnea, unspecified: Secondary | ICD-10-CM | POA: Diagnosis not present

## 2018-09-09 DIAGNOSIS — I161 Hypertensive emergency: Secondary | ICD-10-CM | POA: Diagnosis present

## 2018-09-09 DIAGNOSIS — R51 Headache: Secondary | ICD-10-CM | POA: Diagnosis present

## 2018-09-09 DIAGNOSIS — R531 Weakness: Secondary | ICD-10-CM | POA: Diagnosis present

## 2018-09-09 DIAGNOSIS — K219 Gastro-esophageal reflux disease without esophagitis: Secondary | ICD-10-CM | POA: Diagnosis present

## 2018-09-09 DIAGNOSIS — J189 Pneumonia, unspecified organism: Secondary | ICD-10-CM | POA: Diagnosis present

## 2018-09-09 DIAGNOSIS — E8770 Fluid overload, unspecified: Secondary | ICD-10-CM | POA: Diagnosis present

## 2018-09-09 DIAGNOSIS — M19011 Primary osteoarthritis, right shoulder: Secondary | ICD-10-CM | POA: Diagnosis present

## 2018-09-09 DIAGNOSIS — I959 Hypotension, unspecified: Secondary | ICD-10-CM | POA: Diagnosis present

## 2018-09-09 DIAGNOSIS — D649 Anemia, unspecified: Secondary | ICD-10-CM | POA: Diagnosis present

## 2018-09-09 DIAGNOSIS — N186 End stage renal disease: Secondary | ICD-10-CM | POA: Diagnosis present

## 2018-09-09 DIAGNOSIS — E871 Hypo-osmolality and hyponatremia: Secondary | ICD-10-CM | POA: Diagnosis present

## 2018-09-09 LAB — COMPREHENSIVE METABOLIC PANEL
ALT: 24 U/L (ref 0–44)
AST: 34 U/L (ref 15–41)
Albumin: 3.7 g/dL (ref 3.5–5.0)
Alkaline Phosphatase: 152 U/L — ABNORMAL HIGH (ref 38–126)
Anion gap: 14 (ref 5–15)
BUN: 59 mg/dL — ABNORMAL HIGH (ref 8–23)
CO2: 22 mmol/L (ref 22–32)
Calcium: 9.4 mg/dL (ref 8.9–10.3)
Chloride: 97 mmol/L — ABNORMAL LOW (ref 98–111)
Creatinine, Ser: 10.54 mg/dL — ABNORMAL HIGH (ref 0.44–1.00)
GFR calc Af Amer: 4 mL/min — ABNORMAL LOW (ref >60)
GFR calc non Af Amer: 3 mL/min — ABNORMAL LOW (ref >60)
Glucose, Bld: 126 mg/dL — ABNORMAL HIGH (ref 70–99)
Potassium: 4.6 mmol/L (ref 3.5–5.1)
Sodium: 133 mmol/L — ABNORMAL LOW (ref 135–145)
Total Bilirubin: 0.6 mg/dL (ref 0.3–1.2)
Total Protein: 7.8 g/dL (ref 6.5–8.1)

## 2018-09-09 LAB — CBC
HCT: 33.5 % — ABNORMAL LOW (ref 36.0–46.0)
Hemoglobin: 10.4 g/dL — ABNORMAL LOW (ref 12.0–15.0)
MCH: 26.9 pg (ref 26.0–34.0)
MCHC: 31 g/dL (ref 30.0–36.0)
MCV: 86.6 fL (ref 80.0–100.0)
Platelets: 190 10*3/uL (ref 150–400)
RBC: 3.87 MIL/uL (ref 3.87–5.11)
RDW: 19.1 % — ABNORMAL HIGH (ref 11.5–15.5)
WBC: 15.7 10*3/uL — ABNORMAL HIGH (ref 4.0–10.5)
nRBC: 0.1 % (ref 0.0–0.2)

## 2018-09-09 LAB — CBC WITH DIFFERENTIAL/PLATELET
Abs Immature Granulocytes: 0.04 10*3/uL (ref 0.00–0.07)
Basophils Absolute: 0.1 10*3/uL (ref 0.0–0.1)
Basophils Relative: 1 %
Eosinophils Absolute: 0.3 10*3/uL (ref 0.0–0.5)
Eosinophils Relative: 2 %
HCT: 37.5 % (ref 36.0–46.0)
Hemoglobin: 11 g/dL — ABNORMAL LOW (ref 12.0–15.0)
Immature Granulocytes: 0 %
Lymphocytes Relative: 20 %
Lymphs Abs: 2.5 10*3/uL (ref 0.7–4.0)
MCH: 25.5 pg — ABNORMAL LOW (ref 26.0–34.0)
MCHC: 29.3 g/dL — ABNORMAL LOW (ref 30.0–36.0)
MCV: 87 fL (ref 80.0–100.0)
Monocytes Absolute: 1 10*3/uL (ref 0.1–1.0)
Monocytes Relative: 8 %
Neutro Abs: 8.8 10*3/uL — ABNORMAL HIGH (ref 1.7–7.7)
Neutrophils Relative %: 69 %
Platelets: 219 10*3/uL (ref 150–400)
RBC: 4.31 MIL/uL (ref 3.87–5.11)
RDW: 19.1 % — ABNORMAL HIGH (ref 11.5–15.5)
WBC: 12.7 10*3/uL — ABNORMAL HIGH (ref 4.0–10.5)
nRBC: 0.2 % (ref 0.0–0.2)

## 2018-09-09 LAB — NOVEL CORONAVIRUS, NAA (HOSP ORDER, SEND-OUT TO REF LAB; TAT 18-24 HRS): SARS-CoV-2, NAA: NOT DETECTED

## 2018-09-09 LAB — LIPID PANEL
Cholesterol: 155 mg/dL (ref 0–200)
HDL: 50 mg/dL (ref >40)
LDL Cholesterol: 66 mg/dL (ref 0–99)
Total CHOL/HDL Ratio: 3.1 RATIO
Triglycerides: 196 mg/dL — ABNORMAL HIGH (ref <150)
VLDL: 39 mg/dL (ref 0–40)

## 2018-09-09 LAB — HEMOGLOBIN A1C
Hgb A1c MFr Bld: 4.8 % (ref 4.8–5.6)
Mean Plasma Glucose: 91 mg/dL

## 2018-09-09 LAB — POCT I-STAT EG7
Acid-Base Excess: 5 mmol/L — ABNORMAL HIGH (ref 0.0–2.0)
Bicarbonate: 28.1 mmol/L — ABNORMAL HIGH (ref 20.0–28.0)
Calcium, Ion: 1.03 mmol/L — ABNORMAL LOW (ref 1.15–1.40)
HCT: 35 % — ABNORMAL LOW (ref 36.0–46.0)
Hemoglobin: 11.9 g/dL — ABNORMAL LOW (ref 12.0–15.0)
O2 Saturation: 97 %
Potassium: 4.7 mmol/L (ref 3.5–5.1)
Sodium: 137 mmol/L (ref 135–145)
TCO2: 29 mmol/L (ref 22–32)
pCO2, Ven: 36 mmHg — ABNORMAL LOW (ref 44.0–60.0)
pH, Ven: 7.5 — ABNORMAL HIGH (ref 7.250–7.430)
pO2, Ven: 83 mmHg — ABNORMAL HIGH (ref 32.0–45.0)

## 2018-09-09 LAB — FERRITIN: Ferritin: 790 ng/mL — ABNORMAL HIGH (ref 11–307)

## 2018-09-09 LAB — LACTIC ACID, PLASMA
Lactic Acid, Venous: 1.7 mmol/L (ref 0.5–1.9)
Lactic Acid, Venous: 1.4 mmol/L (ref 0.5–1.9)

## 2018-09-09 LAB — C-REACTIVE PROTEIN: CRP: <0.8 mg/dL (ref <1.0)

## 2018-09-09 LAB — TROPONIN I: Troponin I: <0.03 ng/mL (ref <0.03)

## 2018-09-09 LAB — RENAL FUNCTION PANEL
Albumin: 3.6 g/dL (ref 3.5–5.0)
Anion gap: 17 — ABNORMAL HIGH (ref 5–15)
BUN: 63 mg/dL — ABNORMAL HIGH (ref 8–23)
CO2: 23 mmol/L (ref 22–32)
Calcium: 9.3 mg/dL (ref 8.9–10.3)
Chloride: 97 mmol/L — ABNORMAL LOW (ref 98–111)
Creatinine, Ser: 10.51 mg/dL — ABNORMAL HIGH (ref 0.44–1.00)
GFR calc Af Amer: 4 mL/min — ABNORMAL LOW (ref >60)
GFR calc non Af Amer: 3 mL/min — ABNORMAL LOW (ref >60)
Glucose, Bld: 119 mg/dL — ABNORMAL HIGH (ref 70–99)
Phosphorus: 3.2 mg/dL (ref 2.5–4.6)
Potassium: 6.2 mmol/L — ABNORMAL HIGH (ref 3.5–5.1)
Sodium: 137 mmol/L (ref 135–145)

## 2018-09-09 LAB — PHOSPHORUS: Phosphorus: 3.2 mg/dL (ref 2.5–4.6)

## 2018-09-09 LAB — PROCALCITONIN: Procalcitonin: 0.58 ng/mL

## 2018-09-09 LAB — FIBRINOGEN: Fibrinogen: 558 mg/dL — ABNORMAL HIGH (ref 210–475)

## 2018-09-09 LAB — TSH: TSH: 1.606 u[IU]/mL (ref 0.350–4.500)

## 2018-09-09 LAB — TRIGLYCERIDES: Triglycerides: 165 mg/dL — ABNORMAL HIGH (ref <150)

## 2018-09-09 LAB — I-STAT TROPONIN, ED: Troponin i, poc: 0.02 ng/mL (ref 0.00–0.08)

## 2018-09-09 LAB — MAGNESIUM: Magnesium: 2.4 mg/dL (ref 1.7–2.4)

## 2018-09-09 LAB — D-DIMER, QUANTITATIVE: D-Dimer, Quant: 1.17 ug/mL-FEU — ABNORMAL HIGH (ref 0.00–0.50)

## 2018-09-09 LAB — LACTATE DEHYDROGENASE: LDH: 220 U/L — ABNORMAL HIGH (ref 98–192)

## 2018-09-09 LAB — SARS CORONAVIRUS 2 BY RT PCR (HOSPITAL ORDER, PERFORMED IN ~~LOC~~ HOSPITAL LAB): SARS Coronavirus 2: NEGATIVE

## 2018-09-09 MED ORDER — HYDROXYCHLOROQUINE SULFATE 200 MG PO TABS: 200.0000 mg | ORAL_TABLET | Freq: Two times a day (BID) | ORAL | Status: DC

## 2018-09-09 MED ORDER — HYDRALAZINE HCL 25 MG PO TABS: 25.0000 mg | ORAL_TABLET | Freq: Four times a day (QID) | ORAL | Status: DC

## 2018-09-09 MED ORDER — PANTOPRAZOLE SODIUM 40 MG PO TBEC
40.0000 mg | DELAYED_RELEASE_TABLET | Freq: Every day | ORAL | Status: DC
  Administered 2018-09-09 – 2018-09-13 (×5): 40 mg via ORAL
  Filled 2018-09-09 (×5): qty 1

## 2018-09-09 MED ORDER — IOHEXOL 350 MG/ML SOLN
65.0000 mL | Freq: Once | INTRAVENOUS | Status: AC | PRN
  Administered 2018-09-09: 65 mL via INTRAVENOUS

## 2018-09-09 MED ORDER — VANCOMYCIN HCL 10 G IV SOLR
1500.0000 mg | Freq: Once | INTRAVENOUS | Status: AC
  Administered 2018-09-09: 1500 mg via INTRAVENOUS
  Filled 2018-09-09: qty 1500

## 2018-09-09 MED ORDER — CHLORHEXIDINE GLUCONATE CLOTH 2 % EX PADS
6.0000 | MEDICATED_PAD | Freq: Every day | CUTANEOUS | Status: DC
  Administered 2018-09-10 – 2018-09-13 (×3): 6 via TOPICAL

## 2018-09-09 MED ORDER — AMLODIPINE BESYLATE 10 MG PO TABS
10.0000 mg | ORAL_TABLET | Freq: Every day | ORAL | Status: DC
  Administered 2018-09-09 – 2018-09-12 (×4): 10 mg via ORAL
  Filled 2018-09-09: qty 1
  Filled 2018-09-09: qty 2
  Filled 2018-09-09 (×2): qty 1

## 2018-09-09 MED ORDER — ACETAMINOPHEN 500 MG PO TABS: 500.0000 mg | ORAL_TABLET | Freq: Four times a day (QID) | ORAL | Status: DC | PRN

## 2018-09-09 MED ORDER — ACETAMINOPHEN 325 MG PO TABS
650.0000 mg | ORAL_TABLET | Freq: Four times a day (QID) | ORAL | Status: DC | PRN
  Administered 2018-09-09 – 2018-09-11 (×3): 650 mg via ORAL
  Filled 2018-09-09 (×3): qty 2

## 2018-09-09 MED ORDER — ATORVASTATIN CALCIUM 10 MG PO TABS
10.0000 mg | ORAL_TABLET | Freq: Every evening | ORAL | Status: DC
  Administered 2018-09-09 – 2018-09-12 (×4): 10 mg via ORAL
  Filled 2018-09-09 (×4): qty 1

## 2018-09-09 MED ORDER — HYDRALAZINE HCL 20 MG/ML IJ SOLN
INTRAMUSCULAR | Status: AC
  Administered 2018-09-09: 06:00:00
  Filled 2018-09-09: qty 1

## 2018-09-09 MED ORDER — SODIUM CHLORIDE 0.9 % IV SOLN
2.0000 g | Freq: Once | INTRAVENOUS | Status: AC
  Administered 2018-09-09: 2 g via INTRAVENOUS
  Filled 2018-09-09: qty 2

## 2018-09-09 MED ORDER — HEPARIN SODIUM (PORCINE) 5000 UNIT/ML IJ SOLN
5000.0000 [IU] | Freq: Three times a day (TID) | INTRAMUSCULAR | Status: DC
  Administered 2018-09-09 – 2018-09-12 (×9): 5000 [IU] via SUBCUTANEOUS
  Filled 2018-09-09 (×10): qty 1

## 2018-09-09 MED ORDER — NITROGLYCERIN IN D5W 200-5 MCG/ML-% IV SOLN: 0.0000 ug/min | INTRAVENOUS | Status: DC

## 2018-09-09 MED ORDER — HYDRALAZINE HCL 20 MG/ML IJ SOLN
5.0000 mg | Freq: Once | INTRAMUSCULAR | Status: AC
  Administered 2018-09-09: 5 mg via INTRAVENOUS

## 2018-09-09 MED ORDER — HYDRALAZINE HCL 25 MG PO TABS
25.0000 mg | ORAL_TABLET | Freq: Four times a day (QID) | ORAL | Status: DC
  Administered 2018-09-09 – 2018-09-10 (×4): 25 mg via ORAL
  Filled 2018-09-09 (×5): qty 1

## 2018-09-09 MED ORDER — CINACALCET HCL 30 MG PO TABS
30.0000 mg | ORAL_TABLET | Freq: Every day | ORAL | Status: DC
  Administered 2018-09-09 – 2018-09-12 (×4): 30 mg via ORAL
  Filled 2018-09-09 (×4): qty 1

## 2018-09-09 MED ORDER — ACETAMINOPHEN 650 MG RE SUPP: 650.0000 mg | Freq: Four times a day (QID) | RECTAL | Status: DC | PRN

## 2018-09-09 MED ORDER — RENA-VITE PO TABS
1.0000 | ORAL_TABLET | Freq: Every day | ORAL | Status: DC
  Administered 2018-09-09 – 2018-09-12 (×4): 1 via ORAL
  Filled 2018-09-09 (×4): qty 1

## 2018-09-09 MED ORDER — SEVELAMER CARBONATE 800 MG PO TABS
2400.0000 mg | ORAL_TABLET | Freq: Three times a day (TID) | ORAL | Status: DC
  Administered 2018-09-09 – 2018-09-13 (×11): 2400 mg via ORAL
  Filled 2018-09-09 (×11): qty 3

## 2018-09-09 MED ORDER — FENTANYL CITRATE (PF) 100 MCG/2ML IJ SOLN
50.0000 ug | Freq: Once | INTRAMUSCULAR | Status: AC
  Administered 2018-09-09: 50 ug via INTRAVENOUS
  Filled 2018-09-09: qty 2

## 2018-09-09 MED ORDER — VANCOMYCIN HCL IN DEXTROSE 750-5 MG/150ML-% IV SOLN: 750.0000 mg | INTRAVENOUS | Status: DC

## 2018-09-09 MED ORDER — SODIUM CHLORIDE 0.9 % IV SOLN
2.0000 g | INTRAVENOUS | Status: DC
  Filled 2018-09-09: qty 2

## 2018-09-09 MED ORDER — METOPROLOL TARTRATE 50 MG PO TABS
50.0000 mg | ORAL_TABLET | Freq: Two times a day (BID) | ORAL | Status: DC
  Administered 2018-09-09 – 2018-09-13 (×9): 50 mg via ORAL
  Filled 2018-09-09 (×5): qty 1
  Filled 2018-09-09: qty 2
  Filled 2018-09-09: qty 1
  Filled 2018-09-09 (×2): qty 2
  Filled 2018-09-09: qty 1

## 2018-09-09 MED ORDER — HYDROXYCHLOROQUINE SULFATE 200 MG PO TABS
400.0000 mg | ORAL_TABLET | Freq: Two times a day (BID) | ORAL | Status: DC
  Administered 2018-09-09: 400 mg via ORAL
  Filled 2018-09-09: qty 2

## 2018-09-09 NOTE — Progress Notes (Signed)
Pharmacy Antibiotic Note  Alexa Dougherty is a 73 y.o. female admitted on 09/08/2018 with pneumonia.  Pharmacy has been consulted for cefepime and vancomycin dosing. Of note, pt has hx of ESRD with HD MWF on schedule inpatient per nephrology. WBC trending up to 15.7, LA down to 1.4, afebrile.   Plan: Start cefepime 2g qMWF Vancomycin loading dose 1500mg  x1 now Vancomycin 750mg  qMWF with HD F/u clinical s/sx improvement, de-escalation, cultures, LOT   Weight: 167 lb 8.8 oz (76 kg)  Temp (24hrs), Avg:96.6 F (35.9 C), Min:96.2 F (35.7 C), Max:96.9 F (36.1 C)  Recent Labs  Lab 09/08/18 2350 09/09/18 0136 09/09/18 0428  WBC 12.7*  --  15.7*  CREATININE 10.54*  --  10.51*  LATICACIDVEN 1.7 1.4  --     CrCl cannot be calculated (Unknown ideal weight.).    No Known Allergies  Antimicrobials this admission: Vanc 4/20 >>  Cefepime 4/20 >>   Dose adjustments this admission:   Microbiology results: 4/20 BCx: pend 4/20 COVID-19: negative 4/20 COVID-19: pend  Thank you for allowing pharmacy to be a part of this patient's care.  Juanell Fairly, PharmD PGY1 Pharmacy Resident 09/09/2018 5:06 PM

## 2018-09-09 NOTE — ED Notes (Signed)
RN assessed good thrill and bruit in fistula

## 2018-09-09 NOTE — Progress Notes (Signed)
Msg sent to MD at 2988 about SBP 172 and creaping up after po HTN meds given earlier for a possible PRN med and the parameters

## 2018-09-09 NOTE — Consult Note (Signed)
Renal Service Consult Note [facility]  Alexa Dougherty 09/09/2018 Sol Blazing Requesting Physician:  Dr Leonette Monarch  Reason for Consult:  ESRD pt w/ resp distress HPI: The patient is a 73 y.o. year-old w hx of HTN, HL, and ESRD on HD MWF presented to ED c/o SOB and headache.  Sudden onset SOB at 11 pm.  Hx HD MWF and has not missed HD.  Dose of BP med recently changed per ED notes. Per staff was in resp distress initially.  CXR showing pulm edema/ bilat infiltrates.  BP was high and IV NTG started and pt's breathing has improved significantly.  She developed worsening headache due to ntg and got fentanyl 50 ug IV w/ significant relief. Asked to see for dialysis.   Patient speaks a little [place].  Denied any chest pain. Breathing ok now on FM O2.  No fevers or chills.  COVID test is pending. Temp here was 96.2.  WBC 12.7.   Hb 11.9.  Istat K is 4.7.  Na 137  .      Echart  admitted 08/2016 w/ gen weakness, CKD V, anemia and HTN admitted w/ anemia , gen weakness. Felt better after prbc's. B12 levels high, trop neg. ESRD on MWF since 01/2012.    Past Medical History  Past Medical History:  Diagnosis Date  . Anemia   . Arthritis    Right shoulder  . Chronic kidney disease   . Colon polyps   . Dialysis patient ([facility])   . Elevated cholesterol   . Gastritis and gastroduodenitis 12/29/2014  . GERD (gastroesophageal reflux disease)   . Headache(784.0)   . Hx of adenomatous colonic polyps 01/04/2015  . Hyperlipemia   . Hypertension    Past Surgical History  Past Surgical History:  Procedure Laterality Date  . AV FISTULA PLACEMENT  02/02/2012   Procedure: ARTERIOVENOUS (AV) FISTULA CREATION;  Surgeon: Angelia Mould, MD;  [facility];  Service: Vascular;  Laterality: Left;  . AV FISTULA REPAIR     Angioplasty of fistula multiple times Left  . COLONOSCOPY WITH PROPOFOL N/A 12/29/2014   Procedure: COLONOSCOPY WITH PROPOFOL;  Surgeon: Gatha Mayer, MD;  Location:  [facility]OSCOPY;  Service: Endoscopy;  Laterality: N/A;  . ESOPHAGOGASTRODUODENOSCOPY (EGD) WITH PROPOFOL N/A 12/29/2014   Procedure: ESOPHAGOGASTRODUODENOSCOPY (EGD) WITH PROPOFOL;  Surgeon: Gatha Mayer, MD;  Location: [facility];  Service: Endoscopy;  Laterality: N/A;  . FISTULOGRAM N/A 04/01/2012   Procedure: FISTULOGRAM;  Surgeon: Angelia Mould, MD;  Location: [facility] CATH LAB;  Service: Cardiovascular;  Laterality: N/A;  . INSERTION OF DIALYSIS CATHETER  02/02/2012   Procedure: INSERTION OF DIALYSIS CATHETER;  Surgeon: Angelia Mould, MD;  Location: [facility];  Service: Vascular;  Laterality: Right;  Insertion of 23cm dialysis catheter in Right Internal Jugular   . TUBAL LIGATION  1980   Family History  Family History  Problem Relation Age of Onset  . Hypertension Mother   . Cancer Sister        type unknown  . Cancer Brother        type unknown  . Hypertension Daughter    Social History  reports that she has never smoked. She has never used smokeless tobacco. She reports that she does not drink alcohol or use drugs. Allergies No Known Allergies Home medications Prior to Admission medications   Medication Sig Start Date End Date Taking? Authorizing Provider  acetaminophen (TYLENOL) 500 MG tablet Take 500 mg by mouth every 6 (  six) hours as needed for moderate pain or headache.    [provider]  amLODipine (NORVASC) 10 MG tablet Take 1 tablet by mouth every evening.  10/14/14   [provider]  atorvastatin (LIPITOR) 10 MG tablet Take 10 mg by mouth every evening.     [provider]  b complex-vitamin c-folic acid (NEPHRO-VITE) 0.8 MG TABS Take 0.8 mg by mouth every morning.     [provider]  cinacalcet (SENSIPAR) 30 MG tablet Take 30 mg by mouth at bedtime.    [provider]  dexlansoprazole (DEXILANT) 60 MG capsule Take 60 mg by mouth every evening.     [provider]  metoprolol succinate (TOPROL-XL) 100 MG 24 hr  tablet Take 100 mg by mouth daily. 08/18/16   [provider]  predniSONE (DELTASONE) 10 MG tablet Take 10-40 mg by mouth daily. 40mg  x 2 days  30mg  x 2 days 20mg  x 2 days 10mg  x 1 day 08/31/16   [provider]  RENVELA 800 MG tablet Take 2,400 mg by mouth 3 (three) times daily with meals.  01/15/12   [provider]   Liver Function Tests No results for input(s): AST, ALT, ALKPHOS, BILITOT, PROT, ALBUMIN in the last 168 hours. No results for input(s): LIPASE, AMYLASE in the last 168 hours. CBC Recent Labs  Lab 09/08/18 2350 09/09/18 0032  WBC 12.7*  --   NEUTROABS 8.8*  --   HGB 11.0* 11.9*  HCT 37.5 35.0*  MCV 87.0  --   PLT 219  --    Basic Metabolic Panel Recent Labs  Lab 09/09/18 0032  NA 137  K 4.7   Iron/TIBC/Ferritin/ %Sat    Component Value Date/Time   IRON 64 09/01/2016 1632   TIBC 267 09/01/2016 1632   FERRITIN 995 (H) 09/01/2016 1632   IRONPCTSAT 24 09/01/2016 1632    Vitals:   09/09/18 0035 09/09/18 0040 09/09/18 0041 09/09/18 0045  BP: (!) 181/88 (!) 183/95 (!) 169/90 (!) 178/89  Pulse: 72 71 71 69  Resp: (!) 25 (!) 23 (!) 23 (!) 26  Temp:      TempSrc:      SpO2: 96% 96% 96% 98%   Exam Gen alert, on NRB mask 15 L > 8 L approx No rash, cyanosis or gangrene Sclera anicteric, throat clear  +JVD Chest diffuse rales bilat bases 1/3 up RRR no MRG Abd soft ntnd no mass or ascites +bs GU defer MS no joint effusions or deformity Ext no LE or UE edema, no wounds or ulcers Neuro is alert, Ox 3 , nf LUA AVF+bruit    Home meds:  - amlodipine 10/ metoprolol xl 100 qd  - dexlansoprazole 60 hs/ renvela 3 ac tid/ cinacalcet 30 hs  - atorvastatin 10 hs    [facility]  MWF  3h 35min   400/ 1.5  74kg   2/2.25 bath   Heparin none   LUA AVF  - mircera 75 ug every 2 wks, last ?   CXR > FINDINGS: Cardiomegaly. Bilateral airspace opacities are noted, likely edema. Suspect small effusions. No acute bony abnormality.  IMPRESSION:  Cardiomegaly with bilateral airspace opacities, favor edema/CHF. Small effusions.  Assessment: 1. Acute resp distress - suspect pulm edema but not really grossly overloaded on exam.  Could be infectious but less given good response to IV NTG.  2. ESRD on HD MWF.  Good compliance.  Last HD Friday. K+ wnl.   3. HTN - on norvasc  and metop 4. HL 5. Anemia of ckd - Hb 11 by istat    Plan: 1. Will need HD either tonight or 1st shift in am depending on other ED pt's needs and staffing.  Cont IV ntg for now.        [facility] 09/09/2018, 12:56 AM

## 2018-09-09 NOTE — H&P (Addendum)
[facility] Admission History and Physical Service Pager: [phone]  Patient name: Alexa Dougherty record number: [id number] Date of birth: 1945/12/27 Age: 73 y.o. Gender: female  Primary Care Provider: Bartholome Bill, MD Consultants: Nephrology Code Status: Full (confirmed on admission)  Chief Complaint: Shortness of breath  Assessment and Plan: Alexa Dougherty is a 73 y.o. female presenting with hypertensive emergency. PMH is significant for  has a past medical history of Anemia, Arthritis, Chronic kidney disease, Colon polyps, Dialysis patient ([place]), Elevated cholesterol, Gastritis and gastroduodenitis (12/29/2014), GERD (gastroesophageal reflux disease), Headache(784.0), adenomatous colonic polyps (01/04/2015), Hyperlipemia, and Hypertension.  #Hypertensive emergency Patient present with hypertensive emergency.  Elevated blood pressures in the systolics 379K.  Requiring nitro drip.  Endorgan damage consistent with pulmonary edema as evidenced on chest x-ray and oxygen desaturations.  Patient also with headache, without any focal neurologic deficits.  Expect will improve with dialysis. Need to r/o cardiac dysfunction.   Admit to FPTS 1, progressive unit, attending Dr. Ardelia Mems  Emergent hemodialysis per nephrology.   Continue nitro drip   Monitor blood pressure closely  Monitor respiratory status.  Continuous pulse ox  Vital signs per floor  Repeat chest x-ray tomorrow  A.m. renal function panel and CBC  Nephrology consulted, appreciate recommendations  Cardiac monitoring  Troponin x1, if elevated cycle  N.p.o. while on non-rebreather  If respiratory status worsens, consult CCM as patient may need trial for BiPAP  Continue home amlodipine  Risk strat labs: Lipid, TSH, A1C  Echocargiogram  #PUI COVID-19 Initial SARS-CoV2 negative.  But, hypothermic, and supplementary COVID labs (as listed below) are positive.  Also found  to have bilateral lung opacities on chest x-ray.  Although they favor edema, would like to see respiratory status improve before ruling out [place].  CCM was informed of high risk patient because oxygen requirements greater than 4 L.  Send out  SARS-CoV2  Respiratory & droplet panel  Monitor temperature curve  Tylenol PRN  #ESRD with HD MWF.  Renal consulted in ED.  Recommendations per nephrology  #GERD  Protonix 40 mg   #Hyperlipidemia  Continue home atorvastatin  #Language barrier  Speaks Laotian  FEN/GI: N.p.o. Prophylaxis: Heparin  Disposition: Progressive  History of Present Illness:  Alexa Dougherty is a 73 y.o. female presenting with SOB and elevated blood pressures. Patient was at home when she was had sudden onset shortness of breath.  Patient called 911 for this.  She was found to have elevated blood pressures in the 200s/130s.  Patient endorses having difficulty controlling her blood pressure over the past week.  Patient was also found to be hypoxic with oxygen saturation to 70 %.  Patient was started on CPAP and given nitroglycerin by EMS.  This improved saturations into the 88%.  Patient said that she has felt somewhat feverish and has had an associated cough.  Denies any sick contacts.  Patient is ESRD with HD on MWF.  She denies missing any sessions.  In the ED patient presented with elevated systolic blood pressures in the 200s.  Patient was transitioned to nonrebreather to maintain saturations and to prevent aerosolization of possible SARS- COV2 infection.  Patient was started on a nitroglycerin drip.  This did stabilize blood pressures into the systolics 327M.  Nephrology was emergently consulted.  They planned an additional dialysis session.  Patient's labs were consistent with ESRD with mild hyponatremia, elevated BUN 59, creatinine 10.5, mildly elevated alk phos 150.  COVID-19 negative, but supplementary COVID testing labs were  elevated included hypothermia LDH  of 220, triglycerides elevated 165, ferritin elevated at 790, mild leukocytosis at 12.7, elevated d-dimer at 1.17, elevated fibrinogen at 550, elevated procalcitonin at 0.58.Marland Kitchen  CRP was negative.  Patient was hypothermic to 96.2 Fahrenheit.  Chest x-ray with bilateral opacities favoring edema/CHF.  Due to language barrier, an interpreter was virtually present during the history-taking and subsequent discussion with this patient.    Review Of Systems: Per HPI with the following additions:   Review of Systems  Constitutional: Positive for chills and fever.  Eyes: Negative for blurred vision.  Respiratory: Positive for cough and shortness of breath.   Cardiovascular: Negative for chest pain and leg swelling.       Chest tightness  Gastrointestinal: Negative for abdominal pain.  Neurological: Positive for headaches.    Patient Active Problem List   Diagnosis Date Noted  . General weakness 09/01/2016  . Symptomatic anemia 09/01/2016  . HTN (hypertension) 09/01/2016  . HLD (hyperlipidemia) 09/01/2016  . Hx of adenomatous colonic polyps 01/04/2015  . History of Gastritis and gastroduodenitis 12/29/2014  . CKD (chronic kidney disease) stage V requiring chronic dialysis ([place]) 01/24/2012    Past Medical History: Past Medical History:  Diagnosis Date  . Anemia   . Arthritis    Right shoulder  . Chronic kidney disease   . Colon polyps   . Dialysis patient ([place])   . Elevated cholesterol   . Gastritis and gastroduodenitis 12/29/2014  . GERD (gastroesophageal reflux disease)   . Headache(784.0)   . Hx of adenomatous colonic polyps 01/04/2015  . Hyperlipemia   . Hypertension     Past Surgical History: Past Surgical History:  Procedure Laterality Date  . AV FISTULA PLACEMENT  02/02/2012   Procedure: ARTERIOVENOUS (AV) FISTULA CREATION;  Surgeon: Angelia Mould, MD;  Location: [facility] OR;  Service: Vascular;  Laterality: Left;  . AV FISTULA REPAIR     Angioplasty of fistula multiple  times Left  . COLONOSCOPY WITH PROPOFOL N/A 12/29/2014   Procedure: COLONOSCOPY WITH PROPOFOL;  Surgeon: Gatha Mayer, MD;  [facility];  Service: Endoscopy;  Laterality: N/A;  . ESOPHAGOGASTRODUODENOSCOPY (EGD) WITH PROPOFOL N/A 12/29/2014   Procedure: ESOPHAGOGASTRODUODENOSCOPY (EGD) WITH PROPOFOL;  Surgeon: Gatha Mayer, MD;  [facility];  Service: Endoscopy;  Laterality: N/A;  . FISTULOGRAM N/A 04/01/2012   Procedure: FISTULOGRAM;  Surgeon: Angelia Mould, MD;  [facility] C[facility] LAB;  Service: Cardiovascular;  Laterality: N/A;  . INSERTION OF DIALYSIS CATHETER  02/02/2012   Procedure: INSERTION OF DIALYSIS CATHETER;  Surgeon: Angelia Mould, MD;  Location: [place];  Service: Vascular;  Laterality: Right;  Insertion of 23cm dialysis catheter in Right Internal Jugular   . TUBAL LIGATION  1980    Social History: Social History   Tobacco Use  . Smoking status: Never Smoker  . Smokeless tobacco: Never Used  Substance Use Topics  . Alcohol use: No  . Drug use: No   Please also refer to relevant sections of EMR.  Family History: Family History  Problem Relation Age of Onset  . Hypertension Mother   . Cancer Sister        type unknown  . Cancer Brother        type unknown  . Hypertension Daughter     Allergies and Medications: No Known Allergies No current facility-administered medications on file prior to encounter.    Current Outpatient Medications on File Prior to Encounter  Medication Sig Dispense Refill  . acetaminophen (  TYLENOL) 500 MG tablet Take 500 mg by mouth every 6 (six) hours as needed for moderate pain or headache.    Marland Kitchen amLODipine (NORVASC) 10 MG tablet Take 1 tablet by mouth every evening.     Marland Kitchen atorvastatin (LIPITOR) 10 MG tablet Take 10 mg by mouth every evening.     Marland Kitchen b complex-vitamin c-folic acid (NEPHRO-VITE) 0.8 MG TABS Take 0.8 mg by mouth every morning.     . cinacalcet (SENSIPAR) 30 MG tablet Take 30 mg by mouth at  bedtime.    Marland Kitchen dexlansoprazole (DEXILANT) 60 MG capsule Take 60 mg by mouth every evening.     . metoprolol succinate (TOPROL-XL) 100 MG 24 hr tablet Take 100 mg by mouth daily.    . predniSONE (DELTASONE) 10 MG tablet Take 10-40 mg by mouth daily. 66m x 2 days  358mx 2 days 201m 2 days 82m22m1 day    . RENVELA 800 MG tablet Take 2,400 mg by mouth 3 (three) times daily with meals.       Objective: BP (!) 164/82   Pulse 67   Temp (!) 96.2 F (35.7 C) (Temporal)   Resp (!) 26   SpO2 96%  Exam: Physical Exam  Constitutional: She is oriented to person, place, and time. She appears to not be writhing in pain and not dehydrated.  Non-toxic appearance. She has a sickly appearance. She appears distressed. She is not intubated.  HENT:  Head: Normocephalic and atraumatic.  Eyes: Pupils are equal, round, and reactive to light. EOM are normal. Right eye exhibits no discharge. Left eye exhibits no discharge. No scleral icterus.  Cardiovascular: Normal rate and regular rhythm. Exam reveals no gallop and no friction rub.  No murmur heard. Pulmonary/Chest: No apnea, no tachypnea and no bradypnea. She is not intubated. She is in respiratory distress. She has rhonchi in the right lower field and the left lower field. She exhibits no tenderness and no retraction. Breasts are symmetrical.  Non-rebreather mask in place  Abdominal: Soft. She exhibits no distension. There is no abdominal tenderness.  Musculoskeletal:        General: No deformity.     Comments: Trace pedal edema  Neurological: She is alert and oriented to person, place, and time. She exhibits normal muscle tone.  Skin: Skin is warm and dry.  Psychiatric: Affect and judgment normal.     Labs and Imaging: Results for orders placed or performed during the hospital encounter of 09/08/18 (from the past 24 hour(s))  Lactic acid, plasma     Status: None   Collection Time: 09/08/18 11:50 PM  Result Value Ref Range   Lactic Acid, Venous  1.7 0.5 - 1.9 mmol/L  CBC WITH DIFFERENTIAL     Status: Abnormal   Collection Time: 09/08/18 11:50 PM  Result Value Ref Range   WBC 12.7 (H) 4.0 - 10.5 K/uL   RBC 4.31 3.87 - 5.11 MIL/uL   Hemoglobin 11.0 (L) 12.0 - 15.0 g/dL   HCT 37.5 36.0 - 46.0 %   MCV 87.0 80.0 - 100.0 fL   MCH 25.5 (L) 26.0 - 34.0 pg   MCHC 29.3 (L) 30.0 - 36.0 g/dL   RDW 19.1 (H) 11.5 - 15.5 %   Platelets 219 150 - 400 K/uL   nRBC 0.2 0.0 - 0.2 %   Neutrophils Relative % 69 %   Neutro Abs 8.8 (H) 1.7 - 7.7 K/uL   Lymphocytes Relative 20 %   Lymphs Abs 2.5  0.7 - 4.0 K/uL   Monocytes Relative 8 %   Monocytes Absolute 1.0 0.1 - 1.0 K/uL   Eosinophils Relative 2 %   Eosinophils Absolute 0.3 0.0 - 0.5 K/uL   Basophils Relative 1 %   Basophils Absolute 0.1 0.0 - 0.1 K/uL   Immature Granulocytes 0 %   Abs Immature Granulocytes 0.04 0.00 - 0.07 K/uL  Comprehensive metabolic panel     Status: Abnormal   Collection Time: 09/08/18 11:50 PM  Result Value Ref Range   Sodium 133 (L) 135 - 145 mmol/L   Potassium 4.6 3.5 - 5.1 mmol/L   Chloride 97 (L) 98 - 111 mmol/L   CO2 22 22 - 32 mmol/L   Glucose, Bld 126 (H) 70 - 99 mg/dL   BUN 59 (H) 8 - 23 mg/dL   Creatinine, Ser 10.54 (H) 0.44 - 1.00 mg/dL   Calcium 9.4 8.9 - 10.3 mg/dL   Total Protein 7.8 6.5 - 8.1 g/dL   Albumin 3.7 3.5 - 5.0 g/dL   AST 34 15 - 41 U/L   ALT 24 0 - 44 U/L   Alkaline Phosphatase 152 (H) 38 - 126 U/L   Total Bilirubin 0.6 0.3 - 1.2 mg/dL   GFR calc non Af Amer 3 (L) >60 mL/min   GFR calc Af Amer 4 (L) >60 mL/min   Anion gap 14 5 - 15  D-dimer, quantitative     Status: Abnormal   Collection Time: 09/08/18 11:50 PM  Result Value Ref Range   D-Dimer, Quant 1.17 (H) 0.00 - 0.50 ug/mL-FEU  Procalcitonin     Status: None   Collection Time: 09/08/18 11:50 PM  Result Value Ref Range   Procalcitonin 0.58 ng/mL  Lactate dehydrogenase     Status: Abnormal   Collection Time: 09/08/18 11:50 PM  Result Value Ref Range   LDH 220 (H) 98 - 192  U/L  Ferritin     Status: Abnormal   Collection Time: 09/08/18 11:50 PM  Result Value Ref Range   Ferritin 790 (H) 11 - 307 ng/mL  Triglycerides     Status: Abnormal   Collection Time: 09/08/18 11:50 PM  Result Value Ref Range   Triglycerides 165 (H) <150 mg/dL  Fibrinogen     Status: Abnormal   Collection Time: 09/08/18 11:50 PM  Result Value Ref Range   Fibrinogen 558 (H) 210 - 475 mg/dL  C-reactive protein     Status: None   Collection Time: 09/08/18 11:50 PM  Result Value Ref Range   CRP <0.8 <1.0 mg/dL  SARS Coronavirus 2 [facility] order, Performed in [place] hospital lab)     Status: None   Collection Time: 09/09/18 12:06 AM  Result Value Ref Range   SARS Coronavirus 2 NEGATIVE NEGATIVE  I-Stat Troponin, ED (not at [facility])     Status: None   Collection Time: 09/09/18 12:31 AM  Result Value Ref Range   Troponin i, poc 0.02 0.00 - 0.08 ng/mL   Comment 3          POCT I-Stat EG7     Status: Abnormal   Collection Time: 09/09/18 12:32 AM  Result Value Ref Range   pH, Ven 7.500 (H) 7.250 - 7.430   pCO2, Ven 36.0 (L) 44.0 - 60.0 mmHg   pO2, Ven 83.0 (H) 32.0 - 45.0 mmHg   Bicarbonate 28.1 (H) 20.0 - 28.0 mmol/L   TCO2 29 22 - 32 mmol/L   O2 Saturation 97.0 %   Acid-Base Excess  5.0 (H) 0.0 - 2.0 mmol/L   Sodium 137 135 - 145 mmol/L   Potassium 4.7 3.5 - 5.1 mmol/L   Calcium, Ion 1.03 (L) 1.15 - 1.40 mmol/L   HCT 35.0 (L) 36.0 - 46.0 %   Hemoglobin 11.9 (L) 12.0 - 15.0 g/dL   Patient temperature HIDE    Sample type VENOUS   Lactic acid, plasma     Status: None   Collection Time: 09/09/18  1:36 AM  Result Value Ref Range   Lactic Acid, Venous 1.4 0.5 - 1.9 mmol/L   EKG: Sinus rhythm  Dg Chest Port 1 View  Result Date: 09/09/2018 CLINICAL DATA:  Shortness of breath EXAM: PORTABLE CHEST 1 VIEW COMPARISON:  09/01/2016 FINDINGS: Cardiomegaly. Bilateral airspace opacities are noted, likely edema. Suspect small effusions. No acute bony abnormality. IMPRESSION: Cardiomegaly  with bilateral airspace opacities, favor edema/CHF. Small effusions. Electronically Signed   By: Rolm Baptise M.D.   On: 09/09/2018 00:21    Bonnita Hollow, MD 09/09/2018, 1:58 AM PGY-2, [facility] Intern pager: [phone], text pages welcome

## 2018-09-09 NOTE — ED Notes (Signed)
RN attempted to call report. Was told by charge that receiving nurse was in pt room.

## 2018-09-09 NOTE — ED Notes (Signed)
Pt appears much more comfortable, no visible distress at this time. Resting with eyes closed.

## 2018-09-09 NOTE — ED Notes (Signed)
ED TO INPATIENT HANDOFF REPORT  ED Nurse Name and Phone #:  Kelby Fam [phone]  S Name/Age/Gender Alexa Dougherty 73 y.o. female Room/Bed: RESUSC/RESUSC  Code Status   Code Status: Prior  Home/SNF/Other Home Patient oriented to: self, place, time and situation Is this baseline? Yes   Triage Complete: Triage complete  Chief Complaint resp distress  Triage Note Pt present to ED by EMS c/o SOB and headache. Per EMS rails in all fields. Per EMS pt is from home w/ sudden onset of SOB at 2300. Hx of dialysis, goes normally (m/w/f), recent change in her dose in HTN meds.     Allergies No Known Allergies  Level of Care/Admitting Diagnosis ED Disposition    ED Disposition Condition [facility] Area: [place] [100100]  Level of Care: Progressive [102]  Covid Evaluation: Person Under Investigation (PUI)  Isolation Risk Level: High Risk (Aerosolizing procedure, nebulizer, intubated/ventilation, CPAP/BiPAP)  Diagnosis: Hypertensive emergency [916384]  Admitting Physician: Leeanne Rio [phone]  Attending Physician: Leeanne Rio [phone]  Estimated length of stay: past midnight tomorrow  Certification:: I certify this patient will need inpatient services for at least 2 midnights  PT Class (Do Not Modify): Inpatient [101]  PT Acc Code (Do Not Modify): Private [1]       B Medical/Surgery History Past Medical History:  Diagnosis Date  . Anemia   . Arthritis    Right shoulder  . Chronic kidney disease   . Colon polyps   . Dialysis patient ([place])   . Elevated cholesterol   . Gastritis and gastroduodenitis 12/29/2014  . GERD (gastroesophageal reflux disease)   . Headache(784.0)   . Hx of adenomatous colonic polyps 01/04/2015  . Hyperlipemia   . Hypertension    Past Surgical History:  Procedure Laterality Date  . AV FISTULA PLACEMENT  02/02/2012   Procedure: ARTERIOVENOUS (AV) FISTULA CREATION;  Surgeon: Angelia Mould,  MD;  [facility];  Service: Vascular;  Laterality: Left;  . AV FISTULA REPAIR     Angioplasty of fistula multiple times Left  . COLONOSCOPY WITH PROPOFOL N/A 12/29/2014   Procedure: COLONOSCOPY WITH PROPOFOL;  Surgeon: Gatha Mayer, MD;  Location: [facility] ENDOSCOPY;  Service: Endoscopy;  Laterality: N/A;  . ESOPHAGOGASTRODUODENOSCOPY (EGD) WITH PROPOFOL N/A 12/29/2014   Procedure: ESOPHAGOGASTRODUODENOSCOPY (EGD) WITH PROPOFOL;  Surgeon: Gatha Mayer, MD;  Location: [facility] ENDOSCOPY;  Service: Endoscopy;  Laterality: N/A;  . FISTULOGRAM N/A 04/01/2012   Procedure: FISTULOGRAM;  Surgeon: Angelia Mould, MD;  Location: [facility] CATH LAB;  Service: Cardiovascular;  Laterality: N/A;  . INSERTION OF DIALYSIS CATHETER  02/02/2012   Procedure: INSERTION OF DIALYSIS CATHETER;  Surgeon: Angelia Mould, MD;  Location: [facility];  Service: Vascular;  Laterality: Right;  Insertion of 23cm dialysis catheter in Right Internal Jugular   . TUBAL LIGATION  1980     A IV Location/Drains/Wounds Patient Lines/Drains/Airways Status   Active Line/Drains/Airways    Name:   Placement date:   Placement time:   Site:   Days:   Peripheral IV 04/23/17 Right Hand   04/23/17    1230    Hand   504   Peripheral IV 09/08/18 Right Antecubital   09/08/18    2353    Antecubital   1   Peripheral IV 09/09/18 Right Wrist   09/09/18    0003    Wrist   less than 1   Vascular Access Left Upper arm Arteriovenous fistula  02/02/12    1110    Upper arm   2411          Intake/Output Last 24 hours No intake or output data in the 24 hours ending 09/09/18 0319  Labs/Imaging Results for orders placed or performed during the hospital encounter of 09/08/18 (from the past 48 hour(s))  Lactic acid, plasma     Status: None   Collection Time: 09/08/18 11:50 PM  Result Value Ref Range   Lactic Acid, Venous 1.7 0.5 - 1.9 mmol/L    Comment: Performed at [facility], [place] [address] [place], [place] [address]  CBC WITH  DIFFERENTIAL     Status: Abnormal   Collection Time: 09/08/18 11:50 PM  Result Value Ref Range   WBC 12.7 (H) 4.0 - 10.5 K/uL   RBC 4.31 3.87 - 5.11 MIL/uL   Hemoglobin 11.0 (L) 12.0 - 15.0 g/dL   HCT 37.5 36.0 - 46.0 %   MCV 87.0 80.0 - 100.0 fL   MCH 25.5 (L) 26.0 - 34.0 pg   MCHC 29.3 (L) 30.0 - 36.0 g/dL   RDW 19.1 (H) 11.5 - 15.5 %   Platelets 219 150 - 400 K/uL   nRBC 0.2 0.0 - 0.2 %   Neutrophils Relative % 69 %   Neutro Abs 8.8 (H) 1.7 - 7.7 K/uL   Lymphocytes Relative 20 %   Lymphs Abs 2.5 0.7 - 4.0 K/uL   Monocytes Relative 8 %   Monocytes Absolute 1.0 0.1 - 1.0 K/uL   Eosinophils Relative 2 %   Eosinophils Absolute 0.3 0.0 - 0.5 K/uL   Basophils Relative 1 %   Basophils Absolute 0.1 0.0 - 0.1 K/uL   Immature Granulocytes 0 %   Abs Immature Granulocytes 0.04 0.00 - 0.07 K/uL    Comment: Performed at [facility], [place] [address], [place], [place] [address]  Comprehensive metabolic panel     Status: Abnormal   Collection Time: 09/08/18 11:50 PM  Result Value Ref Range   Sodium 133 (L) 135 - 145 mmol/L   Potassium 4.6 3.5 - 5.1 mmol/L   Chloride 97 (L) 98 - 111 mmol/L   CO2 22 22 - 32 mmol/L   Glucose, Bld 126 (H) 70 - 99 mg/dL   BUN 59 (H) 8 - 23 mg/dL   Creatinine, Ser 10.54 (H) 0.44 - 1.00 mg/dL   Calcium 9.4 8.9 - 10.3 mg/dL   Total Protein 7.8 6.5 - 8.1 g/dL   Albumin 3.7 3.5 - 5.0 g/dL   AST 34 15 - 41 U/L   ALT 24 0 - 44 U/L   Alkaline Phosphatase 152 (H) 38 - 126 U/L   Total Bilirubin 0.6 0.3 - 1.2 mg/dL   GFR calc non Af Amer 3 (L) >60 mL/min   GFR calc Af Amer 4 (L) >60 mL/min   Anion gap 14 5 - 15    Comment: Performed at [facility], [place] [address], [place], [place] [address]  D-dimer, quantitative     Status: Abnormal   Collection Time: 09/08/18 11:50 PM  Result Value Ref Range   D-Dimer, Quant 1.17 (H) 0.00 - 0.50 ug/mL-FEU    Comment: (NOTE) At the manufacturer cut-off of 0.50 ug/mL FEU, this assay has been documented to exclude  PE with a sensitivity and negative predictive value of 97 to 99%.  At this time, this assay has not been approved by the FDA to exclude DVT/VTE. Results should be correlated with  clinical presentation. Performed at [facility], [place] [address], [place], [place] [address]   Procalcitonin     Status: None   Collection Time: 09/08/18 11:50 PM  Result Value Ref Range   Procalcitonin 0.58 ng/mL    Comment:        Interpretation: PCT > 0.5 ng/mL and <= 2 ng/mL: Systemic infection (sepsis) is possible, but other conditions are known to elevate PCT as well. (NOTE)       Sepsis PCT Algorithm           Lower Respiratory Tract                                      Infection PCT Algorithm    ----------------------------     ----------------------------         PCT < 0.25 ng/mL                PCT < 0.10 ng/mL         Strongly encourage             Strongly discourage   discontinuation of antibiotics    initiation of antibiotics    ----------------------------     -----------------------------       PCT 0.25 - 0.50 ng/mL            PCT 0.10 - 0.25 ng/mL               OR       >80% decrease in PCT            Discourage initiation of                                            antibiotics      Encourage discontinuation           of antibiotics    ----------------------------     -----------------------------         PCT >= 0.50 ng/mL              PCT 0.26 - 0.50 ng/mL                AND       <80% decrease in PCT             Encourage initiation of                                             antibiotics       Encourage continuation           of antibiotics    ----------------------------     -----------------------------        PCT >= 0.50 ng/mL                  PCT > 0.50 ng/mL               AND         increase in PCT                  Strongly encourage  initiation of antibiotics    Strongly encourage escalation           of antibiotics                                      -----------------------------                                           PCT <= 0.25 ng/mL                                                 OR                                        > 80% decrease in PCT                                     Discontinue / Do not initiate                                             antibiotics Performed at [facility], [address], [place], [place] [address]   Lactate dehydrogenase     Status: Abnormal   Collection Time: 09/08/18 11:50 PM  Result Value Ref Range   LDH 220 (H) 98 - 192 U/L    Comment: Performed at [facility], [place] [address] [place], [place] [address]  Ferritin     Status: Abnormal   Collection Time: 09/08/18 11:50 PM  Result Value Ref Range   Ferritin 790 (H) 11 - 307 ng/mL    Comment: Performed at [facility], [place] [address], [place], [place] [address]  Triglycerides     Status: Abnormal   Collection Time: 09/08/18 11:50 PM  Result Value Ref Range   Triglycerides 165 (H) <150 mg/dL    Comment: Performed at [place] [address], [place], [place] [address]  Fibrinogen     Status: Abnormal   Collection Time: 09/08/18 11:50 PM  Result Value Ref Range   Fibrinogen 558 (H) 210 - 475 mg/dL    Comment: Performed at [place] [address] [place], [place] [address]  C-reactive protein     Status: None   Collection Time: 09/08/18 11:50 PM  Result Value Ref Range   CRP <0.8 <1.0 mg/dL    Comment: Performed at [facility], [place] [address] [place], [place] [address]  SARS Coronavirus 2 [facility] order, Performed in [facility] hospital lab)     Status: None   Collection Time: 09/09/18 12:06 AM  Result Value Ref Range   SARS Coronavirus 2 NEGATIVE NEGATIVE    Comment: (NOTE) If result is NEGATIVE SARS-CoV-2 target nucleic acids are NOT DETECTED. The SARS-CoV-2 RNA is generally detectable in upper and lower  respiratory specimens during the acute phase of  infection. The lowest  concentration of SARS-CoV-2 viral copies this assay can detect is 250  copies / mL. A negative result does not preclude SARS-CoV-2 infection  and should not be used as the sole basis for treatment or other  patient management decisions.  A negative result may occur with  improper specimen collection / handling, submission of specimen other  than nasopharyngeal swab, presence of viral mutation(s) within the  areas targeted by this assay, and inadequate number of viral copies  (<250 copies / mL). A negative result must be combined with clinical  observations, patient history, and epidemiological information. If result is POSITIVE SARS-CoV-2 target nucleic acids are DETECTED. The SARS-CoV-2 RNA is generally detectable in upper and lower  respiratory specimens dur ing the acute phase of infection.  Positive  results are indicative of active infection with SARS-CoV-2.  Clinical  correlation with patient history and other diagnostic information is  necessary to determine patient infection status.  Positive results do  not rule out bacterial infection or co-infection with other viruses. If result is PRESUMPTIVE POSTIVE SARS-CoV-2 nucleic acids MAY BE PRESENT.   A presumptive positive result was obtained on the submitted specimen  and confirmed on repeat testing.  While 2019 novel coronavirus  (SARS-CoV-2) nucleic acids may be present in the submitted sample  additional confirmatory testing may be necessary for epidemiological  and / or clinical management purposes  to differentiate between  SARS-CoV-2 and other Sarbecovirus currently known to infect humans.  If clinically indicated additional testing with an alternate test  methodology [phone]) is advised. The SARS-CoV-2 RNA is generally  detectable in upper and lower respiratory sp ecimens during the acute  phase of infection. The expected result is Negative. Fact Sheet for Patients:   StrictlyIdeas.no Fact Sheet for Healthcare Providers: BankingDealers.co.za This test is not yet approved or cleared by the [place] FDA and has been authorized for detection and/or diagnosis of SARS-CoV-2 by FDA under an Emergency Use Authorization (EUA).  This EUA will remain in effect (meaning this test can be used) for the duration of the COVID-19 declaration under Section 564(b)(1) of the Act, 21 U.S.C. section 360bbb-3(b)(1), unless the authorization is terminated or revoked sooner. Performed at [facility], [place] [address] [place], [place] [address]   I-Stat Troponin, ED (not at [facility])     Status: None   Collection Time: 09/09/18 12:31 AM  Result Value Ref Range   Troponin i, poc 0.02 0.00 - 0.08 ng/mL   Comment 3            Comment: Due to the release kinetics of cTnI, a negative result within the first hours of the onset of symptoms does not rule out myocardial infarction with certainty. If myocardial infarction is still suspected, repeat the test at appropriate intervals.   POCT I-Stat EG7     Status: Abnormal   Collection Time: 09/09/18 12:32 AM  Result Value Ref Range   pH, Ven 7.500 (H) 7.250 - 7.430   pCO2, Ven 36.0 (L) 44.0 - 60.0 mmHg   pO2, Ven 83.0 (H) 32.0 - 45.0 mmHg   Bicarbonate 28.1 (H) 20.0 - 28.0 mmol/L   TCO2 29 22 - 32 mmol/L   O2 Saturation 97.0 %   Acid-Base Excess 5.0 (H) 0.0 - 2.0 mmol/L   Sodium 137 135 - 145 mmol/L   Potassium 4.7 3.5 - 5.1 mmol/L   Calcium, Ion 1.03 (L) 1.15 - 1.40 mmol/L   HCT 35.0 (L) 36.0 - 46.0 %   Hemoglobin  11.9 (L) 12.0 - 15.0 g/dL   Patient temperature HIDE    Sample type VENOUS   Lactic acid, plasma     Status: None   Collection Time: 09/09/18  1:36 AM  Result Value Ref Range   Lactic Acid, Venous 1.4 0.5 - 1.9 mmol/L    Comment: Performed at [facility], [place] [address], [place], [place] [address]   Dg Chest Port 1 View  Result Date:  09/09/2018 CLINICAL DATA:  Shortness of breath EXAM: PORTABLE CHEST 1 VIEW COMPARISON:  09/01/2016 FINDINGS: Cardiomegaly. Bilateral airspace opacities are noted, likely edema. Suspect small effusions. No acute bony abnormality. IMPRESSION: Cardiomegaly with bilateral airspace opacities, favor edema/CHF. Small effusions. Electronically Signed   By: Rolm Baptise M.D.   On: 09/09/2018 00:21    Pending Labs Unresulted Labs (From admission, onward)    Start     Ordered   09/09/18 0006  Blood Culture (routine x 2)  BLOOD CULTURE X 2,   STAT    Question:  Patient immune status  Answer:  Normal   09/09/18 0006   Signed and Held  CBC  (heparin)  Tomorrow morning,   R    Comments:  Baseline for heparin therapy IF NOT ALREADY DRAWN.  Notify MD if PLT < 100 K.    Signed and Held   Signed and Held  Creatinine, serum  (heparin)  Once,   R    Comments:  Baseline for heparin therapy IF NOT ALREADY DRAWN.    Signed and Held   Signed and Held  Renal function panel  Tomorrow morning,   R     Signed and Held   Signed and Held  Magnesium  Once,   R     Signed and Held   Signed and Held  Phosphorus  Once,   R     Signed and Held   Signed and Held  TSH  Once,   R     Signed and Held          Vitals/Pain Today's Vitals   09/09/18 0155 09/09/18 0200 09/09/18 0215 09/09/18 0245  BP: (!) 165/82 (!) 176/82 (!) 172/86 (!) 182/91  Pulse: 67 66 64 71  Resp: (!) 21 (!) 24 (!) 24 (!) 29  Temp:      TempSrc:      SpO2: 99% 99% 100% 100%    Isolation Precautions No active isolations  Medications Medications  nitroGLYCERIN 50 mg in dextrose 5 % 250 mL (0.2 mg/mL) infusion ( Intravenous Rate/Dose Change 09/09/18 0010)  Chlorhexidine Gluconate Cloth 2 % PADS 6 each (has no administration in time range)  nitroGLYCERIN (NITROSTAT) 0.4 MG SL tablet (  Given 09/09/18 0002)  nitroGLYCERIN 0.2 mg/mL in dextrose 5 % infusion (  Rate/Dose Change 09/09/18 0048)  fentaNYL (SUBLIMAZE) injection 50 mcg (50 mcg  Intravenous Given 09/09/18 0026)    Mobility walks     Focused Assessments Cardiac Assessment Handoff:  Cardiac Rhythm: Normal sinus rhythm Lab Results  Component Value Date   CKTOTAL 39 09/01/2016   TROPONINI <0.03 09/01/2016   Lab Results  Component Value Date   DDIMER 1.17 (H) 09/08/2018   Does the Patient currently have chest pain? No     R Recommendations: See Admitting Provider Note  Report given to:   Additional Notes:  Pt needs translator. Left arm restriction.

## 2018-09-09 NOTE — ED Notes (Signed)
Per nephrology, pt will likely be admitted by hospitalist. Will be on the floor around 2-3.

## 2018-09-09 NOTE — ED Notes (Signed)
All staff in full airborne and contact PPE

## 2018-09-09 NOTE — Progress Notes (Signed)
OT Cancellation Note  Patient Details Name: Alexa Dougherty MRN: [id number] DOB: [birth year]   Cancelled Treatment:    Reason Eval/Treat Not Completed: Patient at procedure or test/ unavailable(HD. Will return as schedule allows. Thank you.)  [place], OTR/L Acute Rehab Pager: [phone] Office: [phone] 09/09/2018, 10:55 AM

## 2018-09-09 NOTE — Progress Notes (Signed)
CTA with bilateral patchy airspace opacities concerning for multifocal pneumonia.  Cannot exclude COVID by this read.    Spoke with Dr. Johnnye Sima, who noted that patient did not need to be seen by ID, but that could start plaquenil for possible COVID treatment.  Patient's QTc 469.  Spoke with pharmacy regarding this and patient's ESRD for plaquenil.  They noted that patient is okay to proceed with plaquenil as no dose adjustment for ESRD and QTC <500 has been deemed appropriate.  Would avoid other medications that can prolong QTc.  Plan: - start IV vanc and cefepime per pharm  - start plaquenil per COVID protocol

## 2018-09-09 NOTE — Progress Notes (Signed)
MD responded earlier to text about HTN meds and stated he was going to resume all home HTN meds but those meds have already been ordered. Pt has a Hydralazine po ordered for every 6 hours a dose due at 2000 and midnight and 0600. No po Hydralazine in Pyxis to pull out. Pharmacy messaged.

## 2018-09-09 NOTE — Evaluation (Signed)
Physical Therapy Evaluation Patient Details Name: Alexa Dougherty MRN: [id number] DOB: August 08, 1945 Today's Date: 09/09/2018   History of Present Illness  Pt adm with acute respiratory distress and hypertensive urgency. Covid neg x 1. Repeat Covid pending. PMH - ESRD on HD, HTN  Clinical Impression  Pt presents to PT with decr mobility due to weakness. Expect if pt remains medically stable will make good progress. Family is supportive and will be able to provide assist at DC.    Follow Up Recommendations Supervision for mobility/OOB;Home health PT(may progress and not need HHPT)    Equipment Recommendations  Other (comment)(To be determined)    Recommendations for Other Services       Precautions / Restrictions Precautions Precautions: Fall Precaution Comments: covid pending Restrictions Weight Bearing Restrictions: No      Mobility  Bed Mobility Overal bed mobility: Needs Assistance Bed Mobility: Supine to Sit;Sit to Supine     Supine to sit: Min assist Sit to supine: Supervision   General bed mobility comments: Assist to elevate trunk into sitting. Gets into bed on knees and rolls onto hip  Transfers Overall transfer level: Needs assistance Equipment used: 1 person hand held assist Transfers: Sit to/from Stand Sit to Stand: Min assist         General transfer comment: Assist to bring hips up and for balance  Ambulation/Gait Ambulation/Gait assistance: Min assist Gait Distance (Feet): 15 Feet Assistive device: 1 person hand held assist Gait Pattern/deviations: Step-through pattern;Decreased step length - right;Decreased step length - left Gait velocity: decr Gait velocity interpretation: <1.31 ft/sec, indicative of household ambulator General Gait Details: Unsteady gait requiring min assist for balance. Amb on 3L of O2 with SpO2 89-91%.   Stairs            Wheelchair Mobility    Modified Rankin (Stroke Patients Only)       Balance Overall  balance assessment: Needs assistance Sitting-balance support: No upper extremity supported;Feet supported Sitting balance-Leahy Scale: Fair     Standing balance support: Single extremity supported Standing balance-Leahy Scale: Poor Standing balance comment: UE support                             Pertinent Vitals/Pain Pain Assessment: No/denies pain    Home Living Family/patient expects to be discharged to:: Private residence Living Arrangements: Children Available Help at Discharge: Family;Available 24 hours/day Type of Home: House Home Access: Stairs to enter   [employer] of Steps: 1 Home Layout: Two level;Bed/bath upstairs Home Equipment: Walker - 2 wheels Additional Comments: rollator vs 2 wheeled walker, daughter unsure    Prior Function Level of Independence: Needs assistance   Gait / Transfers Assistance Needed: modified independent without assistive device. Up/down stairs modified independent. Fatigues in community.  ADL's / Homemaking Assistance Needed: Modified independent with dressing. Gets down into tub for bath. Fixes meals on non dialysis days        Hand Dominance        Extremity/Trunk Assessment   Upper Extremity Assessment Upper Extremity Assessment: Defer to OT evaluation    Lower Extremity Assessment Lower Extremity Assessment: Generalized weakness       Communication   Communication: Prefers language other than English  Cognition Arousal/Alertness: Awake/alert Behavior During Therapy: WFL for tasks assessed/performed Overall Cognitive Status: Within Functional Limits for tasks assessed  General Comments      Exercises     Assessment/Plan    PT Assessment Patient needs continued PT services  PT Problem List Decreased strength;Decreased activity tolerance;Decreased balance;Decreased mobility;Decreased knowledge of use of DME       PT Treatment  Interventions DME instruction;Gait training;Stair training;Functional mobility training;Therapeutic activities;Therapeutic exercise;Balance training;Patient/family education    PT Goals (Current goals can be found in the Care Plan section)  Acute Rehab PT Goals Patient Stated Goal: family wants pt to return home PT Goal Formulation: With patient/family Time For Goal Achievement: 09/23/18 Potential to Achieve Goals: Good    Frequency Min 3X/week   Barriers to discharge Inaccessible home environment 2 story home with bed/bath on 2nd floor    Co-evaluation               AM-PAC PT "6 Clicks" Mobility  Outcome Measure Help needed turning from your back to your side while in a flat bed without using bedrails?: None Help needed moving from lying on your back to sitting on the side of a flat bed without using bedrails?: A Little Help needed moving to and from a bed to a chair (including a wheelchair)?: A Little Help needed standing up from a chair using your arms (e.g., wheelchair or bedside chair)?: A Little Help needed to walk in hospital room?: A Little Help needed climbing 3-5 steps with a railing? : A Lot 6 Click Score: 18    End of Session Equipment Utilized During Treatment: Oxygen Activity Tolerance: Patient limited by fatigue Patient left: in bed;with call bell/phone within reach Nurse Communication: Mobility status PT Visit Diagnosis: Unsteadiness on feet (R26.81);Other abnormalities of gait and mobility (R26.89);Muscle weakness (generalized) (M62.81)    Time: 5189-8421 PT Time Calculation (min) (ACUTE ONLY): 34 min   Charges:   PT Evaluation $PT Eval Moderate Complexity: 1 Mod PT Treatments $Gait Training: 8-22 mins        [place] Pager [phone] Office [place] 09/09/2018, 4:15 PM

## 2018-09-09 NOTE — Progress Notes (Signed)
Patient ID: Renay Crammer, female   DOB: [birth year], 73 y.o.   MRN: [id number]   [facility] Progress Note    Subjective:   Patient was seen on dialysis and the procedure was supervised. BFR 400 Via LAVF BP is 152/80.  Patient appears to be tolerating treatment well.    Objective:   BP (!) 152/80 (BP Location: Right Arm)   Pulse 72   Temp (!) 96.9 F (36.1 C) (Axillary)   Resp (!) 26   Wt 76 kg   SpO2 100%   BMI 32.72 kg/m   Intake/Output: No intake/output data recorded.   Intake/Output this shift:  Total I/O In: 120 [P.O.:120] Out: 1960 [Other:1960] Weight change:   Physical Exam: Gen: mild distress wearing oxygen mask CVS:RRR Resp: bibasilar rales EXH:BZJIRC Ext: no edema, LUE AVF +T/B  Labs: BMET Recent Labs  Lab 09/08/18 2350 09/09/18 0032 09/09/18 0428  NA 133* 137 137  K 4.6 4.7 6.2*  CL 97*  --  97*  CO2 22  --  23  GLUCOSE 126*  --  119*  BUN 59*  --  63*  CREATININE 10.54*  --  10.51*  ALBUMIN 3.7  --  3.6  CALCIUM 9.4  --  9.3  PHOS  --   --  3.2  3.2   CBC Recent Labs  Lab 09/08/18 2350 09/09/18 0032 09/09/18 0428  WBC 12.7*  --  15.7*  NEUTROABS 8.8*  --   --   HGB 11.0* 11.9* 10.4*  HCT 37.5 35.0* 33.5*  MCV 87.0  --  86.6  PLT 219  --  190    @IMGRELPRIORS @ Medications:    . amLODipine  10 mg Oral Q supper  . atorvastatin  10 mg Oral QPM  . Chlorhexidine Gluconate Cloth  6 each Topical Q0600  . cinacalcet  30 mg Oral QHS  . heparin  5,000 Units Subcutaneous Q8H  . metoprolol tartrate  50 mg Oral BID  . multivitamin  1 tablet Oral QHS  . pantoprazole  40 mg Oral Daily  . sevelamer carbonate  2,400 mg Oral TID WC    [facility]  MWF  3h 29min   400/ 1.5  74kg   2/2.25 bath   Heparin none   LUA AVF  - mircera 75 ug every 2 wks, last ?  Assessment/ Plan:   1. Acute respiratory distress- covid-19 negative x 1 but hypothermic and bilateral lung infiltrates.  Repeat novel coronavirus with send out.  Continue with  droplet/resp precautions.  Will plan for UF with HD as BP tolerates to see if this improves her respiratory status.   2. Hypertensive emergency- receiving IV ntg and hydralazine.  UF with HD and follow BP's.  3. ESRD- continue with HD qMWF and follow 4. Anemia:stable 5. CKD-MBD: stable 6. Nutrition: renal diet 7. Hypertension: as above 8. Hyperkalemia- using 1 K bath for 1.5 hrs and follow.  9. Leukocytosis- r/u infectious etiology.  Donetta Potts, MD [place] Pager [phone] 09/09/2018, 11:09 AM

## 2018-09-09 NOTE — Progress Notes (Addendum)
Asked to come to unit to help with pt on ntg gtt.  On arrival to 2w(progressive care unit), pt's BP-205/90 on 20 mcg of ntg.  Orders to titrate every 3 min if SBP-100-110s or if pt having chest pain.  Also order to turn off ntg gtt when HD started.  MD paged about order clarification d/t to inability to titrate gtts on progressive care floor.  Order modified to read if SBP>160 or DBP>130, leave ntg on current rate, if SBP<160 or DBP<130, may titrate gtt down to off.  If SBP>200, or DBP>130, please page MD.  Also order to give pt 5mg  hydralazine X 1. Per [facility], HD on their way to unit to dialyze pt.  RRT on unit and will continue to stay on unit until HD here and can titrate gtt to off.  Discussed plan with Manuela Schwartz, RN.

## 2018-09-09 NOTE — ED Notes (Signed)
RN messaged doctor in chart about pt c/o of headache. Was told to call residence on staff. Paged with no answer.

## 2018-09-09 NOTE — ED Notes (Signed)
admitting Provider at bedside. 

## 2018-09-09 NOTE — ED Notes (Signed)
2W not ready for patient per staff.

## 2018-09-09 NOTE — ED Notes (Signed)
phlebotomy at bedside.  

## 2018-09-09 NOTE — ED Notes (Signed)
RN attempted to take pt off NRB to [place] 6L. Pt sats went down to 84%. RN put pt back on NRB.

## 2018-09-09 NOTE — Progress Notes (Signed)
Pt with increase WOB and labored resp refuses to breathe thru nose. O2 sat 82-84% on 5 L/M HF. RT notified and informed of above. Stated to put her on NRB mask and if she doesn't improve call them back. Pt started on 15 L/M NRB. Pt continues to moan and breathe thru her mouth. O2 sats now 95%  Will cont to monitor

## 2018-09-09 NOTE — ED Notes (Addendum)
nephro MD at bedside

## 2018-09-09 NOTE — Progress Notes (Addendum)
FPTS Interim Progress Note  S: Seen and examined by Dr. Ardelia Mems  O: BP (!) 182/99 (BP Location: Right Arm)   Pulse 86   Temp (!) 96.9 F (36.1 C) (Axillary)   Resp (!) 34   Wt 76 kg   SpO2 99%   BMI 32.72 kg/m    Seen and examined by Dr. Ardelia Mems  A/P: HTN Emergency BP is improving following HD, nitro gtt was stopped during HD.  Neprho notes they will plan for UF with HD as BP tolerates to see if will improve resp status.  On metoprolol 50mg  BID, which she received 1 dose of this AM, did not receive home norvasc today. - HD per neprho - d/c nitro gtt, can restart as needed - home norvasc, metoprolol  SOB: COVID PUI Send-out COVID is pending.  Patient remains PUI.  Did have mildly elevated D-Dimer to 1.17  Patient is ESRD, therefore could be 2/2 to this.  Given history, however, will opt to obtain CTA.  Confirmed with Dr. Marval Regal that okay to use contrast.  CTA for concern of PE and would also aid in assess possible pulmonary edema. - obtain CTA, no need to repeat CXR  - f/u COVID - monitor Resp status closely, on 5L HFNC at present  HLD Lipid Panel with LDL 66, no need to adjust atorvastatin at this time.  - cont atorvastatin 10 mg  Cleophas Dunker, DO 09/09/2018, 12:23 PM PGY-1, [place] [facility] pager [phone]

## 2018-09-09 NOTE — ED Notes (Signed)
Daughter, [name], would like an update as soon as possible at [phone]

## 2018-09-09 NOTE — ED Notes (Addendum)
Pt now resting comfortably, no more moaning, vital signs WLD

## 2018-09-09 NOTE — ED Provider Notes (Signed)
[facility] EMERGENCY DEPARTMENT Provider Note  CSN: [id number] Arrival date & time: 09/08/18 2350  Chief Complaint(s) Shortness of Breath  HPI Alexa Dougherty is a 73 y.o. female with a history of hypertension and ESRD on dialysis who presents by EMS from home for shortness of breath.  They were called out due to her shortness of breath which started suddenly around 2300.  When they arrived the patient saturations were in the 70s her blood pressures were noted to be 270/130s.  Patient was placed on CPAP and given nitroglycerin.  Blood pressures improved with systolics in the 937T.  Saturations improved up to 88%.  She was transitioned to nonrebreather in order to bring her in to the emergency department to prevent from the air slicing any possible coronavirus.   They reported no infectious symptoms.  Patient denies any chest pain.  Still endorsing shortness of breath.  Endorses headache after nitroglycerin.  Patient has not missed any dialysis sessions.  They report that she has been having a difficult time controlling her hypertension and recently had medication adjustments.  Denies any other physical complaints.  HPI  Past Medical History Past Medical History:  Diagnosis Date  . Anemia   . Arthritis    Right shoulder  . Chronic kidney disease   . Colon polyps   . Dialysis patient ([place])   . Elevated cholesterol   . Gastritis and gastroduodenitis 12/29/2014  . GERD (gastroesophageal reflux disease)   . Headache(784.0)   . Hx of adenomatous colonic polyps 01/04/2015  . Hyperlipemia   . Hypertension    Patient Active Problem List   Diagnosis Date Noted  . General weakness 09/01/2016  . Symptomatic anemia 09/01/2016  . HTN (hypertension) 09/01/2016  . HLD (hyperlipidemia) 09/01/2016  . Hx of adenomatous colonic polyps 01/04/2015  . History of Gastritis and gastroduodenitis 12/29/2014  . CKD (chronic kidney disease) stage V requiring chronic dialysis ([place])  01/24/2012   Home Medication(s) Prior to Admission medications   Medication Sig Start Date End Date Taking? Authorizing Provider  acetaminophen (TYLENOL) 500 MG tablet Take 500 mg by mouth every 6 (six) hours as needed for moderate pain or headache.    [provider]  amLODipine (NORVASC) 10 MG tablet Take 1 tablet by mouth every evening.  10/14/14   [provider]  atorvastatin (LIPITOR) 10 MG tablet Take 10 mg by mouth every evening.     [provider]  b complex-vitamin c-folic acid (NEPHRO-VITE) 0.8 MG TABS Take 0.8 mg by mouth every morning.     [provider]  cinacalcet (SENSIPAR) 30 MG tablet Take 30 mg by mouth at bedtime.    [provider]  dexlansoprazole (DEXILANT) 60 MG capsule Take 60 mg by mouth every evening.     [provider]  metoprolol succinate (TOPROL-XL) 100 MG 24 hr tablet Take 100 mg by mouth daily. 08/18/16   [provider]  predniSONE (DELTASONE) 10 MG tablet Take 10-40 mg by mouth daily. 40mg  x 2 days  30mg  x 2 days 20mg  x 2 days 10mg  x 1 day 08/31/16   [provider]  RENVELA 800 MG tablet Take 2,400 mg by mouth 3 (three) times daily with meals.  01/15/12   [provider]  Past Surgical History Past Surgical History:  Procedure Laterality Date  . AV FISTULA PLACEMENT  02/02/2012   Procedure: ARTERIOVENOUS (AV) FISTULA CREATION;  Surgeon: Angelia Mould, MD;  Location: [facility] OR;  Service: Vascular;  Laterality: Left;  . AV FISTULA REPAIR     Angioplasty of fistula multiple times Left  . COLONOSCOPY WITH PROPOFOL N/A 12/29/2014   Procedure: COLONOSCOPY WITH PROPOFOL;  Surgeon: Gatha Mayer, MD;  Location: [facility] ENDOSCOPY;  Service: Endoscopy;  Laterality: N/A;  . ESOPHAGOGASTRODUODENOSCOPY (EGD) WITH PROPOFOL N/A 12/29/2014   Procedure:  ESOPHAGOGASTRODUODENOSCOPY (EGD) WITH PROPOFOL;  Surgeon: Gatha Mayer, MD;  Location: [facility] ENDOSCOPY;  Service: Endoscopy;  Laterality: N/A;  . FISTULOGRAM N/A 04/01/2012   Procedure: FISTULOGRAM;  Surgeon: Angelia Mould, MD;  Location: [facility] CATH LAB;  Service: Cardiovascular;  Laterality: N/A;  . INSERTION OF DIALYSIS CATHETER  02/02/2012   Procedure: INSERTION OF DIALYSIS CATHETER;  Surgeon: Angelia Mould, MD;  Location: [facility];  Service: Vascular;  Laterality: Right;  Insertion of 23cm dialysis catheter in Right Internal Jugular   . TUBAL LIGATION  1980   Family History Family History  Problem Relation Age of Onset  . Hypertension Mother   . Cancer Sister        type unknown  . Cancer Brother        type unknown  . Hypertension Daughter     Social History Social History   Tobacco Use  . Smoking status: Never Smoker  . Smokeless tobacco: Never Used  Substance Use Topics  . Alcohol use: No  . Drug use: No   Allergies Patient has no known allergies.  Review of Systems Review of Systems All other systems are reviewed and are negative for acute change except as noted in the HPI  Physical Exam Vital Signs  I have reviewed the triage vital signs BP (!) 164/82   Pulse 67   Temp (!) 96.2 F (35.7 C) (Temporal)   Resp (!) 26   SpO2 96%   Physical Exam Vitals signs reviewed.  Constitutional:      General: She is not in acute distress.    Appearance: She is well-developed. She is not diaphoretic.  HENT:     Head: Normocephalic and atraumatic.     Nose: Nose normal.  Eyes:     General: No scleral icterus.       Right eye: No discharge.        Left eye: No discharge.     Conjunctiva/sclera: Conjunctivae normal.     Pupils: Pupils are equal, round, and reactive to light.  Neck:     Musculoskeletal: Normal range of motion and neck supple.  Cardiovascular:     Rate and Rhythm: Normal rate and regular rhythm.     Heart sounds: No murmur. No friction rub.  No gallop.   Pulmonary:     Effort: Tachypnea, accessory muscle usage and respiratory distress present.     Breath sounds: No stridor. Examination of the right-upper field reveals rales. Examination of the left-upper field reveals rales. Examination of the right-middle field reveals rales. Examination of the left-middle field reveals rales. Examination of the right-lower field reveals rales. Examination of the left-lower field reveals rales. Rales (fine) present.  Abdominal:     General: There is no distension.     Palpations: Abdomen is soft.     Tenderness: There is no abdominal tenderness.  Musculoskeletal:        General: No tenderness.  Arms:  Skin:    General: Skin is warm and dry.     Findings: No erythema or rash.  Neurological:     Mental Status: She is alert and oriented to person, place, and time.     ED Results and Treatments Labs (all labs ordered are listed, but only abnormal results are displayed) Labs Reviewed  CBC WITH DIFFERENTIAL/PLATELET - Abnormal; Notable for the following components:      Result Value   WBC 12.7 (*)    Hemoglobin 11.0 (*)    MCH 25.5 (*)    MCHC 29.3 (*)    RDW 19.1 (*)    Neutro Abs 8.8 (*)    All other components within normal limits  COMPREHENSIVE METABOLIC PANEL - Abnormal; Notable for the following components:   Sodium 133 (*)    Chloride 97 (*)    Glucose, Bld 126 (*)    BUN 59 (*)    Creatinine, Ser 10.54 (*)    Alkaline Phosphatase 152 (*)    GFR calc non Af Amer 3 (*)    GFR calc Af Amer 4 (*)    All other components within normal limits  D-DIMER, QUANTITATIVE (NOT AT [facility]) - Abnormal; Notable for the following components:   D-Dimer, Quant 1.17 (*)    All other components within normal limits  LACTATE DEHYDROGENASE - Abnormal; Notable for the following components:   LDH 220 (*)    All other components within normal limits  TRIGLYCERIDES - Abnormal; Notable for the following components:   Triglycerides 165 (*)     All other components within normal limits  FIBRINOGEN - Abnormal; Notable for the following components:   Fibrinogen 558 (*)    All other components within normal limits  POCT I-STAT EG7 - Abnormal; Notable for the following components:   pH, Ven 7.500 (*)    pCO2, Ven 36.0 (*)    pO2, Ven 83.0 (*)    Bicarbonate 28.1 (*)    Acid-Base Excess 5.0 (*)    Calcium, Ion 1.03 (*)    HCT 35.0 (*)    Hemoglobin 11.9 (*)    All other components within normal limits  SARS CORONAVIRUS 2 (HOSPITAL ORDER, [place] LAB)  CULTURE, BLOOD (ROUTINE X 2)  CULTURE, BLOOD (ROUTINE X 2)  LACTIC ACID, PLASMA  LACTIC ACID, PLASMA  PROCALCITONIN  FERRITIN  C-REACTIVE PROTEIN  I-STAT TROPONIN, ED                                                                                                                         EKG  EKG Interpretation  Date/Time: 09/08/2018   23:50:56   Ventricular Rate:   85 PR Interval:   * QRS Duration:  97 QT Interval:   394 QTC Calculation:  469 R Axis:    28 Text Interpretation: Normal sinus rhythm.  No significant changes since last tracing.      Radiology Dg Chest [facility]  Result Date: 09/09/2018 CLINICAL DATA:  Shortness of breath EXAM: PORTABLE CHEST 1 VIEW COMPARISON:  09/01/2016 FINDINGS: Cardiomegaly. Bilateral airspace opacities are noted, likely edema. Suspect small effusions. No acute bony abnormality. IMPRESSION: Cardiomegaly with bilateral airspace opacities, favor edema/CHF. Small effusions. Electronically Signed   By: Rolm Baptise M.D.   On: 09/09/2018 00:21   Pertinent labs & imaging results that were available during my care of the patient were reviewed by me and considered in my medical decision making (see chart for details).  Medications Ordered in ED Medications  nitroGLYCERIN 50 mg in dextrose 5 % 250 mL (0.2 mg/mL) infusion ( Intravenous Rate/Dose Change 09/09/18 0010)  Chlorhexidine Gluconate Cloth 2 % PADS 6 each (has  no administration in time range)  nitroGLYCERIN (NITROSTAT) 0.4 MG SL tablet (  Given 09/09/18 0002)  nitroGLYCERIN 0.2 mg/mL in dextrose 5 % infusion (  Rate/Dose Change 09/09/18 0048)  fentaNYL (SUBLIMAZE) injection 50 mcg (50 mcg Intravenous Given 09/09/18 0026)                                                                                                                                    Procedures .Critical Care Performed by: Fatima Blank, MD Authorized by: Fatima Blank, MD   Critical care provider statement:    Critical care time (minutes):  60   Critical care was necessary to treat or prevent imminent or life-threatening deterioration of the following conditions:  Respiratory failure   Critical care was time spent personally by me on the following activities:  Discussions with consultants, evaluation of patient's response to treatment, examination of patient, ordering and performing treatments and interventions, ordering and review of laboratory studies, ordering and review of radiographic studies, pulse oximetry, re-evaluation of patient's condition, obtaining history from patient or surrogate and review of old charts    (including critical care time)  Medical Decision Making / ED Course I have reviewed the nursing notes for this encounter and the patient's prior records (if available in EHR or on provided paperwork).    Hypertensive emergency resulting and pulmonary edema and respiratory distress.  Patient provided with sublingual nitroglycerin and placed on a nitro drip.  Saturations improved from initial sats of 86% to 95% after management.   Patient denied any infectious symptoms other than a dry cough.  Coronavirus testing was obtain as the patient requires admission.  Case discussed with nephrology who is aware the patient's need for dialysis.  Admit the patient to medicine to the stepdown unit for continued management.  Final Clinical Impression(s) /  ED Diagnoses Final diagnoses:  Hypertensive emergency      This chart was dictated using voice recognition software.  Despite best efforts to proofread,  errors can occur which can change the documentation meaning.   Fatima Blank, MD 09/09/18 [phone]

## 2018-09-10 ENCOUNTER — Encounter (HOSPITAL_COMMUNITY): Payer: Self-pay

## 2018-09-10 ENCOUNTER — Inpatient Hospital Stay (HOSPITAL_COMMUNITY): Payer: Medicare Other

## 2018-09-10 ENCOUNTER — Other Ambulatory Visit: Payer: Self-pay

## 2018-09-10 DIAGNOSIS — R0600 Dyspnea, unspecified: Secondary | ICD-10-CM

## 2018-09-10 DIAGNOSIS — I161 Hypertensive emergency: Secondary | ICD-10-CM

## 2018-09-10 DIAGNOSIS — I5021 Acute systolic (congestive) heart failure: Secondary | ICD-10-CM

## 2018-09-10 LAB — CBC
HCT: 32.8 % — ABNORMAL LOW (ref 36.0–46.0)
Hemoglobin: 10.2 g/dL — ABNORMAL LOW (ref 12.0–15.0)
MCH: 26.6 pg (ref 26.0–34.0)
MCHC: 31.1 g/dL (ref 30.0–36.0)
MCV: 85.6 fL (ref 80.0–100.0)
Platelets: 159 10*3/uL (ref 150–400)
RBC: 3.83 MIL/uL — ABNORMAL LOW (ref 3.87–5.11)
RDW: 19.7 % — ABNORMAL HIGH (ref 11.5–15.5)
WBC: 15.6 10*3/uL — ABNORMAL HIGH (ref 4.0–10.5)
nRBC: 0 % (ref 0.0–0.2)

## 2018-09-10 LAB — ECHOCARDIOGRAM LIMITED: Weight: 2680.79 oz

## 2018-09-10 LAB — RENAL FUNCTION PANEL
Albumin: 3.2 g/dL — ABNORMAL LOW (ref 3.5–5.0)
Anion gap: 22 — ABNORMAL HIGH (ref 5–15)
BUN: 38 mg/dL — ABNORMAL HIGH (ref 8–23)
CO2: 18 mmol/L — ABNORMAL LOW (ref 22–32)
Calcium: 9 mg/dL (ref 8.9–10.3)
Chloride: 97 mmol/L — ABNORMAL LOW (ref 98–111)
Creatinine, Ser: 7.8 mg/dL — ABNORMAL HIGH (ref 0.44–1.00)
GFR calc Af Amer: 5 mL/min — ABNORMAL LOW (ref >60)
GFR calc non Af Amer: 5 mL/min — ABNORMAL LOW (ref >60)
Glucose, Bld: 102 mg/dL — ABNORMAL HIGH (ref 70–99)
Phosphorus: 5 mg/dL — ABNORMAL HIGH (ref 2.5–4.6)
Potassium: 5.7 mmol/L — ABNORMAL HIGH (ref 3.5–5.1)
Sodium: 137 mmol/L (ref 135–145)

## 2018-09-10 LAB — TROPONIN I: Troponin I: <0.03 ng/mL (ref <0.03)

## 2018-09-10 MED ORDER — SODIUM CHLORIDE 0.9 % IV SOLN: 2.0000 g | INTRAVENOUS | Status: DC

## 2018-09-10 MED ORDER — NITROGLYCERIN 0.4 MG/HR TD PT24
0.4000 mg | MEDICATED_PATCH | Freq: Every day | TRANSDERMAL | Status: DC
  Administered 2018-09-10: 0.4 mg via TRANSDERMAL
  Filled 2018-09-10 (×2): qty 1

## 2018-09-10 MED ORDER — HYDRALAZINE HCL 50 MG PO TABS
50.0000 mg | ORAL_TABLET | Freq: Four times a day (QID) | ORAL | Status: DC
  Administered 2018-09-10 – 2018-09-12 (×7): 50 mg via ORAL
  Filled 2018-09-10 (×7): qty 1

## 2018-09-10 MED ORDER — SODIUM ZIRCONIUM CYCLOSILICATE 10 G PO PACK
10.0000 g | PACK | Freq: Two times a day (BID) | ORAL | Status: DC
  Administered 2018-09-10 – 2018-09-11 (×2): 10 g via ORAL
  Filled 2018-09-10 (×5): qty 1

## 2018-09-10 NOTE — Progress Notes (Signed)
Order for echo cancelled this morning due to patient's COVID status.   Patient seen by CCM and curb sided with ID- she has a low pre-test probability and has two negative COVID test (two different mechanisms of testing). CCM recommended discontinuing plaquenil, isolation precautions.   Placing repeat order for Echocardiogram   Zettie Cooley, M.D.  Family Medicine  PGY-1 09/10/2018 11:15 AM

## 2018-09-10 NOTE — Discharge Summary (Addendum)
[facility] Discharge Summary  Patient name: Alexa Dougherty record number: [id number] Date of birth: 1946/02/13 Age: 73 y.o. Gender: female Date of Admission: 09/08/2018  Date of Discharge: 09/13/2018 Admitting Physician: Leeanne Rio, MD  Primary Care Provider: Bartholome Bill, MD Consultants: PCCM, ID (curbside), Nephrology  Indication for Hospitalization: Hypertensive Emergency and Acute Hypoxic Respiratory Failure  Discharge Diagnoses/Problem List:  Acute hypoxic respiratory failure Hypertensive emergency with pulmonary edema ESRD with HD Chronic normocytic anemia GERD Hyperlipidemia Language barrier  Disposition: home with HHPT/OT  Discharge Condition: stable  Discharge Exam:  General: 73 y.o. female in NAD, in HD Cardio: RRR no m/r/g Lungs: CTAB, no wheezing, no rhonchi, no crackles, no IWOB on RA Abdomen: Soft, non-tender to palpation, positive bowel sounds Skin: warm and dry Extremities: No edema   Brief Hospital Course:  Briefly, 73 yo F with history of ESRD on MWF HD, hypertension, hyperlipidemia presenting with dyspnea, hypoxia, and hypertensive emergency.  Acute Hypoxic Respiratory Failure Patient with worsening shortness of breath requiring nonrebreather mask to maintain O2 sats.  Chest x-ray showing bilateral lung opacities as well as edema.  Patient placed on Plaquenil for possible COVID.  Patient initially PUI for COVID rule out but initial rapid COVID as well as repeat send out COVID test both negative.  CRP also low.  D-dimer was elevated however.  Given these results it was low pretest probability that patient had coronavirus.  CCM was consulted who agreed with this recommendation.  Infectious disease was curbside consulted who also agreed with discontinuing isolation precautions as well as Plaquenil.  Shortness of breath thought to be secondary to fluid overload.  Patient is ESRD requiring hemodialysis.   Nephrology was consulted for more frequent dialysis. Patient received HD on 4/20, 4/22, and 4/24.  Her respiratory status continued to improve.  At the time of discharge she was on RA requiring minimal O2.  Given that CTA was suggestive for multifocal pneumonia, and patient's procalcitonin was up-trending she was placed on IV vancomycin and cefepime, which was transitioned to cefdinir and doxycycline on 4/23 due to improvement in respiratory status, resolution of leukocytosis, and lack of fever.  She will continue cefdinir and doxycyline for a total of 7 days of therapy. By 4/23, patient was transitioned to RA.  Ambulation with pulse ox was performed and patient did not desat, therefore she will not require home O2.   It is likely that this acute hypoxic respiratory failure was mostly 2/2 volume overload and multifocal pneumonia.  Hypertensive emergency On presentation patient in hypertensive emergency with endorgan damage of pulmonary edema.  Blood pressure improved with hemodialysis as well as nitro drip.  Was also started back on p.o. medications of Norvasc and metoprolol.  Hydralazine was also added to regimen. At the time of discharge, her BP was 150/71 and she was discharged on a regimen of norvasc 10mg  daily, metoprolol 50mg  BID, hydralazine 50mg  TID.  While patient has been compliant with attending HD sessions, she has requested to leave early in the past, which is likely the cause of her volume overload.  Would recommend that she stay for full HD sessions in the future.  Issues for Follow Up:  1. BP regimen: hydralazine added, watch BP 2. Ensure completion of antibiotics 3. Ensure that patient completes full HD sessions.  Significant Procedures: HD x3  Significant Labs and Imaging:  Recent Labs  Lab 09/11/18 0546 09/12/18 0448 09/13/18 0435  WBC 13.3* 8.3 7.8  HGB 9.2* 9.0* 9.1*  HCT 28.7* 27.5* 28.5*  PLT 186 192 199   Recent Labs  Lab 09/08/18 2350  09/09/18 0428 09/10/18 0633  09/11/18 0546 09/12/18 0448 09/13/18 0435  NA 133*   < > 137 137 134* 137 137  K 4.6   < > 6.2* 5.7* 4.1 3.8 4.2  CL 97*  --  97* 97* 92* 95* 94*  CO2 22  --  23 18* 23 27 24   GLUCOSE 126*  --  119* 102* 127* 109* 114*  BUN 59*  --  63* 38* 61* 30* 59*  CREATININE 10.54*  --  10.51* 7.80* 10.17* 5.77* 9.12*  CALCIUM 9.4  --  9.3 9.0 9.0 9.1 9.5  MG  --   --  2.4  --   --   --   --   PHOS  --   --  3.2  3.2 5.0*  --   --  2.8  ALKPHOS 152*  --   --   --   --   --   --   AST 34  --   --   --   --   --   --   ALT 24  --   --   --   --   --   --   ALBUMIN 3.7  --  3.6 3.2*  --   --  2.9*   < > = values in this interval not displayed.    Ct Angio Chest Pe W Or Wo Contrast  Addendum Date: 09/09/2018   ADDENDUM REPORT: 09/09/2018 16:59 ADDENDUM: Critical Value/emergent results were called by telephone at the time of interpretation on 09/09/2018 at 4:59 pm to Dr. Chrisandra Netters , who verbally acknowledged these results. Electronically Signed   By: Marijo Conception M.D.   On: 09/09/2018 16:59   Result Date: 09/09/2018 CLINICAL DATA:  Shortness of breath. EXAM: CT ANGIOGRAPHY CHEST WITH CONTRAST TECHNIQUE: Multidetector CT imaging of the chest was performed using the standard protocol during bolus administration of intravenous contrast. Multiplanar CT image reconstructions and MIPs were obtained to evaluate the vascular anatomy. CONTRAST:  21mL OMNIPAQUE IOHEXOL 350 MG/ML SOLN COMPARISON:  Radiograph of same day. FINDINGS: Cardiovascular: Satisfactory opacification of the pulmonary arteries to the segmental level. No evidence of pulmonary embolism. Mild cardiomegaly is noted. No pericardial effusion. Atherosclerosis of thoracic aorta is noted without aneurysm or dissection. Mediastinum/Nodes: No enlarged mediastinal, hilar, or axillary lymph nodes. Thyroid gland, trachea, and esophagus demonstrate no significant findings. Lungs/Pleura: No pneumothorax or pleural effusion is noted. Patchy airspace  opacities are noted throughout both lungs with relative sparing of the left lung base. These findings are concerning for multifocal pneumonia. Upper Abdomen: Minimal cholelithiasis is noted. Right renal atrophy is noted consistent with history of end-stage renal disease. Musculoskeletal: No chest wall abnormality. No acute or significant osseous findings. Review of the MIP images confirms the above findings. IMPRESSION: No definite evidence of pulmonary embolus. Bilateral patchy airspace opacities are noted concerning for multifocal pneumonia. There are a spectrum of findings in the lungs which can be seen with acute atypical infection (as well as other non-infectious etiologies). In particular, viral pneumonia (including COVID-19) should be considered in the appropriate clinical setting. Minimal cholelithiasis. Aortic Atherosclerosis (ICD10-I70.0). Electronically Signed: By: Marijo Conception M.D. On: 09/09/2018 16:52   Dg Chest Port 1 View  Result Date: 09/09/2018 CLINICAL DATA:  Shortness of breath EXAM: PORTABLE CHEST 1 VIEW COMPARISON:  09/01/2016 FINDINGS: Cardiomegaly. Bilateral airspace opacities are noted, likely  edema. Suspect small effusions. No acute bony abnormality. IMPRESSION: Cardiomegaly with bilateral airspace opacities, favor edema/CHF. Small effusions. Electronically Signed   By: Rolm Baptise M.D.   On: 09/09/2018 00:21   Results/Tests Pending at Time of Discharge: none  Discharge Medications:  Allergies as of 09/13/2018   No Known Allergies     Medication List    STOP taking these medications   metoprolol succinate 100 MG 24 hr tablet Commonly known as:  TOPROL-XL Replaced by:  metoprolol tartrate 50 MG tablet     TAKE these medications   acetaminophen 500 MG tablet Commonly known as:  TYLENOL Take 500 mg by mouth every 6 (six) hours as needed for moderate pain or headache.   amLODipine 10 MG tablet Commonly known as:  NORVASC Take 1 tablet by mouth every evening.    atorvastatin 10 MG tablet Commonly known as:  LIPITOR Take 10 mg by mouth every evening.   b complex-vitamin c-folic acid 0.8 MG Tabs tablet Take 1 tablet by mouth every morning.   cefdinir 300 MG capsule Commonly known as:  OMNICEF Take 1 capsule (300 mg total) by mouth every evening for 3 days.   cinacalcet 30 MG tablet Commonly known as:  SENSIPAR Take 30 mg by mouth at bedtime.   Dexilant 60 MG capsule Generic drug:  dexlansoprazole Take 60 mg by mouth daily.   doxycycline 100 MG tablet Commonly known as:  VIBRA-TABS Take 1 tablet (100 mg total) by mouth every 12 (twelve) hours for 3 days.   hydrALAZINE 50 MG tablet Commonly known as:  APRESOLINE Take 1 tablet (50 mg total) by mouth every 8 (eight) hours.   lidocaine-prilocaine cream Commonly known as:  EMLA Apply 1 application topically See admin instructions. Apply a small amount to access site (AVF) 1 to 2 hours before dialysis. Cover with occlusive dressing (saran warp).   metoprolol tartrate 50 MG tablet Commonly known as:  LOPRESSOR Take 1 tablet (50 mg total) by mouth 2 (two) times daily. Replaces:  metoprolol succinate 100 MG 24 hr tablet   sevelamer carbonate 800 MG tablet Commonly known as:  Renvela Take 3 tablets (2,400 mg total) by mouth 3 (three) times daily with meals. What changed:  how much to take            Durable Medical Equipment  (From admission, onward)         Start     Ordered   09/12/18 1546  For home use only DME oxygen  Once    Question Answer Comment  Mode or (Route) Nasal cannula   Liters per Minute 2   Frequency Continuous (stationary and portable oxygen unit needed)   Oxygen conserving device Yes   Oxygen delivery system Gas      09/12/18 1545          Discharge Instructions: Please refer to Patient Instructions section of EMR for full details.  Patient was counseled important signs and symptoms that should prompt return to medical care, changes in medications,  dietary instructions, activity restrictions, and follow up appointments.   Follow-Up Appointments: Follow-up Information    Bartholome Bill, MD. Schedule an appointment as soon as possible for a visit in 1 week(s).   Specialty:  Family Medicine Contact information: [address] [place] [address] [phone]           Cleophas Dunker, DO 09/13/2018, 3:02 PM PGY-1, [place]

## 2018-09-10 NOTE — Progress Notes (Signed)
NAME:  Alexa Dougherty, MRN:  [id number], DOB:  03-Apr-1946, LOS: 1 ADMISSION DATE:  09/08/2018, CONSULTATION DATE:  09/09/2018 REFERRING MD:  Ardelia Mems -FMTS, CHIEF COMPLAINT:  Dyspnea.   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  Presented 4/19 with increasing SOB and headache with hypertensive urgency.  Initial improvement with NTG. COVID testing negative, but submitted for repeat testing given dyspnea of unclear etiology per FMTS.  Past Medical History   Past Medical History:  Diagnosis Date  . Anemia   . Arthritis    Right shoulder  . Chronic kidney disease   . Colon polyps   . Dialysis patient ([facility])   . Elevated cholesterol   . Gastritis and gastroduodenitis 12/29/2014  . GERD (gastroesophageal reflux disease)   . Headache(784.0)   . Hx of adenomatous colonic polyps 01/04/2015  . Hyperlipemia   . Hypertension      Past Surgical History:  Procedure Laterality Date  . AV FISTULA PLACEMENT  02/02/2012   Procedure: ARTERIOVENOUS (AV) FISTULA CREATION;  Surgeon: Angelia Mould, MD;  Location[facility] OR;  Service: Vascular;  Laterality: Left;  . AV FISTULA REPAIR     Angioplasty of fistula multiple times Left  . COLONOSCOPY WITH PROPOFOL N/A 12/29/2014   Procedure: COLONOSCOPY WITH PROPOFOL;  Surgeon: Gatha Mayer, MD;  Location: [facility] ENDOSCOPY;  Service: Endoscopy;  Laterality: N/A;  . ESOPHAGOGASTRODUODENOSCOPY (EGD) WITH PROPOFOL N/A 12/29/2014   Procedure: ESOPHAGOGASTRODUODENOSCOPY (EGD) WITH PROPOFOL;  Surgeon: Gatha Mayer, MD;  Location: [facility] ENDOSCOPY;  Service: Endoscopy;  Laterality: N/A;  . FISTULOGRAM N/A 04/01/2012   Procedure: FISTULOGRAM;  Surgeon: Angelia Mould, MD;  Location: [facility] CATH LAB;  Service: Cardiovascular;  Laterality: N/A;  . INSERTION OF DIALYSIS CATHETER  02/02/2012   Procedure: INSERTION OF DIALYSIS CATHETER;  Surgeon: Angelia Mould, MD;  Location: [place];  Service: Vascular;  Laterality:  Right;  Insertion of 23cm dialysis catheter in Right Internal Jugular   . TUBAL LIGATION  1980     Review of Systems:   Review of Systems  Unable to perform ROS: Language  Patient doesn't speak [place]  Social History   reports that she has never smoked. She has never used smokeless tobacco. She reports that she does not drink alcohol or use drugs.   Family History   Her family history includes Cancer in her brother and sister; Hypertension in her daughter and mother.   Allergies No Known Allergies   Home Medications  Prior to Admission medications   Medication Sig Start Date End Date Taking? Authorizing Provider  acetaminophen (TYLENOL) 500 MG tablet Take 500 mg by mouth every 6 (six) hours as needed for moderate pain or headache.   Yes [provider]  amLODipine (NORVASC) 10 MG tablet Take 1 tablet by mouth every evening.  10/14/14  Yes [provider]  atorvastatin (LIPITOR) 10 MG tablet Take 10 mg by mouth every evening.    Yes [provider]  b complex-vitamin c-folic acid (NEPHRO-VITE) 0.8 MG TABS Take 1 tablet by mouth every morning.    Yes [provider]  cinacalcet (SENSIPAR) 30 MG tablet Take 30 mg by mouth at bedtime.   Yes [provider]  dexlansoprazole (DEXILANT) 60 MG capsule Take 60 mg by mouth daily.    Yes [provider]  lidocaine-prilocaine (EMLA) cream Apply 1 application topically See admin instructions. Apply a small amount to access site (AVF) 1 to 2  hours before dialysis. Cover with occlusive dressing (saran warp). 08/27/18  Yes [provider]  metoprolol succinate (TOPROL-XL) 100 MG 24 hr tablet Take 100 mg by mouth daily. 08/18/16  Yes [provider]  RENVELA 800 MG tablet Take 1,600 mg by mouth 3 (three) times daily with meals.  01/15/12  Yes [provider]     Interim history/subjective:  Remains dyspneic. PCCM consulted for further episode of desaturation to mid 80's.  Patient is now on 100% NRB.  Objective   Blood pressure (!) 170/92, pulse 86, temperature 98.2 F (36.8 C), temperature source Axillary, resp. rate (!) 22, weight 76 kg, SpO2 100 %.        Intake/Output Summary (Last 24 hours) at 09/10/2018 0855 Last data filed at 09/09/2018 1811 Gross per 24 hour  Intake 560 ml  Output 1960 ml  Net -1400 ml   Filed Weights   09/09/18 0730 09/09/18 1032  Weight: 78.5 kg 76 kg    Examination: Physical Exam Constitutional:      General: She is not in acute distress.    Appearance: She is obese. She is not toxic-appearing.  HENT:     Head: Normocephalic and atraumatic.  Eyes:     Extraocular Movements: Extraocular movements intact.  Neck:     Musculoskeletal: Neck supple.     Vascular: Hepatojugular reflux and JVD (to angle of the jaw) present.  Cardiovascular:     Rate and Rhythm: Normal rate and regular rhythm.     Heart sounds: Normal heart sounds.  Pulmonary:     Effort: Pulmonary effort is normal. Tachypnea present. No accessory muscle usage or respiratory distress.     Breath sounds: Examination of the right-middle field reveals rales. Examination of the left-middle field reveals rales. Examination of the right-lower field reveals rales. Examination of the left-lower field reveals rales. Rales present.  Abdominal:     Palpations: Abdomen is soft.     Tenderness: There is no abdominal tenderness.  Musculoskeletal:     Right lower leg: No edema.     Left lower leg: No edema.  Skin:    General: Skin is warm and dry.  Neurological:     General: No focal deficit present.     Mental Status: She is alert.      Ancillary tests (personally reviewed)  CBC: Recent Labs  Lab 09/08/18 2350 09/09/18 0032 09/09/18 0428 09/10/18 0633  WBC 12.7*  --  15.7* 15.6*  NEUTROABS 8.8*  --   --   --   HGB 11.0* 11.9* 10.4* 10.2*  HCT 37.5 35.0* 33.5* 32.8*  MCV 87.0  --  86.6 85.6  PLT 219  --  190 076    Basic Metabolic Panel: Recent  Labs  Lab 09/08/18 2350 09/09/18 0032 09/09/18 0428  NA 133* 137 137  K 4.6 4.7 6.2*  CL 97*  --  97*  CO2 22  --  23  GLUCOSE 126*  --  119*  BUN 59*  --  63*  CREATININE 10.54*  --  10.51*  CALCIUM 9.4  --  9.3  MG  --   --  2.4  PHOS  --   --  3.2  3.2   GFR: CrCl cannot be calculated (Unknown ideal weight.). Recent Labs  Lab 09/08/18 2350 09/09/18 0136 09/09/18 0428 09/10/18 0633  PROCALCITON 0.58  --   --   --   WBC 12.7*  --  15.7* 15.6*  LATICACIDVEN 1.7 1.4  --   --  Liver Function Tests: Recent Labs  Lab 09/08/18 2350 09/09/18 0428  AST 34  --   ALT 24  --   ALKPHOS 152*  --   BILITOT 0.6  --   PROT 7.8  --   ALBUMIN 3.7 3.6   No results for input(s): LIPASE, AMYLASE in the last 168 hours. No results for input(s): AMMONIA in the last 168 hours.  ABG    Component Value Date/Time   HCO3 28.1 (H) 09/09/2018 0032   TCO2 29 09/09/2018 0032   O2SAT 97.0 09/09/2018 0032     Coagulation Profile: No results for input(s): INR, PROTIME in the last 168 hours.  Cardiac Enzymes: Recent Labs  Lab 09/09/18 0409 09/10/18 0633  TROPONINI <0.03 <0.03    HbA1C: Hgb A1c MFr Bld  Date/Time Value Ref Range Status  09/09/2018 04:28 AM 4.8 4.8 - 5.6 % Final    Comment:    (NOTE)         Prediabetes: 5.7 - 6.4         Diabetes: >6.4         Glycemic control for adults with diabetes: <7.0     CBG: No results for input(s): GLUCAP in the last 168 hours.   Assessment & Plan:  Increasing dyspnea with hypertension in patient with stage V CKD.  While radiographic pattern is atypical for pulmonary edema, the clinical presentation and exam is most consistent with this diagnosis. The patient has had two negative COVID tests and her inflammatory markers are largely negative - CRP is negative and ferritin is non-specifically elevated. Procalcitonin is only marginally elevated and if decreasing on subsequent testing could also stop antibiotics particularly if  she has significant improvement with fluid removal.   The patient has had 2 negative COVID tests done with different methodologies. Given the at most intermediate clinical probability of COVID, this is sufficient to rule the latter out and discontinue isolation and hydroxychloroquine. Would pursue aggressive fluid removal and rule out cardiac disease.  Failing this we can reassess for other etiologies although all atypical infections and inflammatory lung conditions would be far less likely given negative CRP.  At the present time the patient is in no distress and does not require ventilatory support and is likely to improve promptly with fluid removal. As such we can hold off on ICU transfer.   Recommend aggressive treatment for HF and volume overload.  Best practice:  Diet: N/A Pain/Anxiety/Delirium protocol (if indicated): N/A VAP protocol (if indicated): N/A DVT prophylaxis: UFH GI prophylaxis: PPI Urinary catheter: N/A Glucose control: N/A Mobility: AAT Code Status: FULL Family Communication: N/A Disposition: remain on 2W   60 min with >50% on counseling and coordination of care.     Kipp Brood, MD [facility] ICU Physician [place]  Pager: [phone] Mobile: [phone] After hours: [phone].  09/10/2018, 8:55 AM

## 2018-09-10 NOTE — Progress Notes (Signed)
Patient ID: Alexa Dougherty, female   DOB: 04-06-1946, 73 y.o.   MRN: [id number] S: evaluated by PCCM and felt low risk for Covid-19 given 2 negative tests and improvement with HD.  To be taken off of airborne precautions per primary svc. O:BP (!) 180/81 (BP Location: Right Arm)   Pulse 81   Temp 99.2 F (37.3 C) (Oral)   Resp (!) 27   Wt 76 kg   SpO2 95%   BMI 32.72 kg/m   Intake/Output Summary (Last 24 hours) at 09/10/2018 1704 Last data filed at 09/10/2018 0900 Gross per 24 hour  Intake 580 ml  Output -  Net 580 ml   Intake/Output: I/O last 3 completed shifts: In: 87 [P.O.:210; IV Piggyback:350] Out: 1960 [Other:1960]  Intake/Output this shift:  Total I/O In: 50 [P.O.:50] Out: -  Weight change:  Direct contact was not performed due to concern for covid-19.  Physical exam was discussed with PCCM.  Pt with evidence of volume overload on exam with bilateral rales and JVD.  Recent Labs  Lab 09/08/18 2350 09/09/18 0032 09/09/18 0428 09/10/18 0633  NA 133* 137 137 137  K 4.6 4.7 6.2* 5.7*  CL 97*  --  97* 97*  CO2 22  --  23 18*  GLUCOSE 126*  --  119* 102*  BUN 59*  --  63* 38*  CREATININE 10.54*  --  10.51* 7.80*  ALBUMIN 3.7  --  3.6 3.2*  CALCIUM 9.4  --  9.3 9.0  PHOS  --   --  3.2  3.2 5.0*  AST 34  --   --   --   ALT 24  --   --   --    Liver Function Tests: Recent Labs  Lab 09/08/18 2350 09/09/18 0428 09/10/18 0633  AST 34  --   --   ALT 24  --   --   ALKPHOS 152*  --   --   BILITOT 0.6  --   --   PROT 7.8  --   --   ALBUMIN 3.7 3.6 3.2*   No results for input(s): LIPASE, AMYLASE in the last 168 hours. No results for input(s): AMMONIA in the last 168 hours. CBC: Recent Labs  Lab 09/08/18 2350 09/09/18 0032 09/09/18 0428 09/10/18 0633  WBC 12.7*  --  15.7* 15.6*  NEUTROABS 8.8*  --   --   --   HGB 11.0* 11.9* 10.4* 10.2*  HCT 37.5 35.0* 33.5* 32.8*  MCV 87.0  --  86.6 85.6  PLT 219  --  190 159   Cardiac Enzymes: Recent Labs  Lab  09/09/18 0409 09/10/18 0633  TROPONINI <0.03 <0.03   CBG: No results for input(s): GLUCAP in the last 168 hours.  Iron Studies:  Recent Labs    09/08/18 2350  FERRITIN 790*   Studies/Results: Ct Angio Chest Pe W Or Wo Contrast  Addendum Date: 09/09/2018   ADDENDUM REPORT: 09/09/2018 16:59 ADDENDUM: Critical Value/emergent results were called by telephone at the time of interpretation on 09/09/2018 at 4:59 pm to Dr. Chrisandra Netters , who verbally acknowledged these results. Electronically Signed   By: Marijo Conception M.D.   On: 09/09/2018 16:59   Result Date: 09/09/2018 CLINICAL DATA:  Shortness of breath. EXAM: CT ANGIOGRAPHY CHEST WITH CONTRAST TECHNIQUE: Multidetector CT imaging of the chest was performed using the standard protocol during bolus administration of intravenous contrast. Multiplanar CT image reconstructions and MIPs were obtained to evaluate the vascular anatomy. CONTRAST:  74mL OMNIPAQUE IOHEXOL 350 MG/ML SOLN COMPARISON:  Radiograph of same day. FINDINGS: Cardiovascular: Satisfactory opacification of the pulmonary arteries to the segmental level. No evidence of pulmonary embolism. Mild cardiomegaly is noted. No pericardial effusion. Atherosclerosis of thoracic aorta is noted without aneurysm or dissection. Mediastinum/Nodes: No enlarged mediastinal, hilar, or axillary lymph nodes. Thyroid gland, trachea, and esophagus demonstrate no significant findings. Lungs/Pleura: No pneumothorax or pleural effusion is noted. Patchy airspace opacities are noted throughout both lungs with relative sparing of the left lung base. These findings are concerning for multifocal pneumonia. Upper Abdomen: Minimal cholelithiasis is noted. Right renal atrophy is noted consistent with history of end-stage renal disease. Musculoskeletal: No chest wall abnormality. No acute or significant osseous findings. Review of the MIP images confirms the above findings. IMPRESSION: No definite evidence of pulmonary  embolus. Bilateral patchy airspace opacities are noted concerning for multifocal pneumonia. There are a spectrum of findings in the lungs which can be seen with acute atypical infection (as well as other non-infectious etiologies). In particular, viral pneumonia (including COVID-19) should be considered in the appropriate clinical setting. Minimal cholelithiasis. Aortic Atherosclerosis (ICD10-I70.0). Electronically Signed: By: Marijo Conception M.D. On: 09/09/2018 16:52   Portable Chest 1 View  Result Date: 09/10/2018 CLINICAL DATA:  73 year old female with CHF versus pneumonia EXAM: PORTABLE CHEST 1 VIEW COMPARISON:  Chest x-ray and CT scan of the chest 09/09/2018 FINDINGS: Interval progression of diffuse bilateral patchy airspace opacities most notably in the left mid lung. Stable cardiomegaly. No definite pleural effusion or pneumothorax. A metallic stent is present in the region of the left axillary vein. No acute osseous abnormality. IMPRESSION: Progressive patchy bilateral interstitial and airspace opacities. The asymmetry favors a multifocal pneumonia or pneumonitis over pure congestive heart failure although a degree of superimposed pulmonary edema is likely in the setting of cardiomegaly. Electronically Signed   By: Jacqulynn Cadet M.D.   On: 09/10/2018 08:07   Dg Chest Port 1 View  Result Date: 09/09/2018 CLINICAL DATA:  Shortness of breath EXAM: PORTABLE CHEST 1 VIEW COMPARISON:  09/01/2016 FINDINGS: Cardiomegaly. Bilateral airspace opacities are noted, likely edema. Suspect small effusions. No acute bony abnormality. IMPRESSION: Cardiomegaly with bilateral airspace opacities, favor edema/CHF. Small effusions. Electronically Signed   By: Rolm Baptise M.D.   On: 09/09/2018 00:21   . amLODipine  10 mg Oral Q supper  . atorvastatin  10 mg Oral QPM  . Chlorhexidine Gluconate Cloth  6 each Topical Q0600  . cinacalcet  30 mg Oral QHS  . heparin  5,000 Units Subcutaneous Q8H  . hydrALAZINE  25 mg  Oral Q6H  . metoprolol tartrate  50 mg Oral BID  . multivitamin  1 tablet Oral QHS  . nitroGLYCERIN  0.4 mg Transdermal Daily  . pantoprazole  40 mg Oral Daily  . sevelamer carbonate  2,400 mg Oral TID WC    BMET    Component Value Date/Time   NA 137 09/10/2018 0633   K 5.7 (H) 09/10/2018 0633   CL 97 (L) 09/10/2018 0633   CO2 18 (L) 09/10/2018 0633   GLUCOSE 102 (H) 09/10/2018 0633   BUN 38 (H) 09/10/2018 0633   CREATININE 7.80 (H) 09/10/2018 0633   CALCIUM 9.0 09/10/2018 0633   CALCIUM 8.3 (L) 12/14/2011 1634   GFRNONAA 5 (L) 09/10/2018 0633   GFRAA 5 (L) 09/10/2018 0633   CBC    Component Value Date/Time   WBC 15.6 (H) 09/10/2018 0633   RBC 3.83 (L) 09/10/2018 9937  HGB 10.2 (L) 09/10/2018 0633   HCT 32.8 (L) 09/10/2018 0633   HCT 27.2 (L) 09/02/2016 0347   PLT 159 09/10/2018 0633   MCV 85.6 09/10/2018 0633   MCH 26.6 09/10/2018 0633   MCHC 31.1 09/10/2018 0633   RDW 19.7 (H) 09/10/2018 0633   LYMPHSABS 2.5 09/08/2018 2350   MONOABS 1.0 09/08/2018 2350   EOSABS 0.3 09/08/2018 2350   BASOSABS 0.1 09/08/2018 2350    SW [facility] MWF 3h 8min 400/ 1.5 74kg 2/2.25 bath Heparin none LUA AVF - mircera 75 ug every 2 wks, last ?  Assessment/ Plan:   1. Acute respiratory distress- covid-19 negative x 1 but hypothermic and bilateral lung infiltrates.  Repeat novel coronavirus with send out.  Continue with droplet/resp precautions.  Discussed case with Dr. Lynetta Mare and he believes that this is mainly related to volume overload.  Will plan for UF with HD again tomorrow as BP tolerates to see if this continues to improve her respiratory status.   2. Hypertensive emergency- receiving IV ntg and hydralazine.  UF with HD and follow BP's.  3. ESRD- continue with HD qMWF and follow 4. Anemia:stable 5. CKD-MBD: stable 6. Nutrition: renal diet 7. Hypertension: as above 8. Hyperkalemia- using 1 K bath for 1.5 hrs and follow. Will also add lokelma and renal  diet. 9. Leukocytosis- r/o infectious etiology.   Donetta Potts, MD [employer] [phone]

## 2018-09-10 NOTE — Progress Notes (Signed)
  Echocardiogram 2D Echocardiogram has been performed.  Chelsea L Androw 09/10/2018, 2:13 PM

## 2018-09-10 NOTE — Progress Notes (Signed)
Spoke with 1st call when paging [phone], stated that pt wanted to eat and that she has tolerated her HFNC today no non-re breather needed at any point of my shift. Also stated that pt BP was going up and that she had np PRNs what was stated to me was that pt was to have dialysis treatment today and they did not want her to have anything at this time that could cause her BP to drop during her treatment. Resident stated that attending MD and team would round on her and determine diet at the time of team rounding as well as if pt could be moved off the covid hall since airborne and contact precautions have been d/c'd. Still waiting for team to round on pt, will continue to monitor pt status and vital signs at this time.

## 2018-09-10 NOTE — Evaluation (Signed)
Occupational Therapy Evaluation Patient Details Name: Alexa Dougherty MRN: [id number] DOB: Oct 24, 1945 Today's Date: 09/10/2018    History of Present Illness Pt adm with acute respiratory distress and hypertensive urgency. Covid neg x 2. PMH - ESRD on HD, HTN   Clinical Impression   PTA, pt was living with her daughter and was independent with BADLs and light IADLs; pt's daughter would assist more on HD days. Pt currently requiring Min Guard-Min A for ADLs and fucntional mobility. Pt presenting with decreased activity tolerance as she quickly fatigues and dropped to 94% SpO2 on 10L. Educated pt on purse lip breathing and she demonstrated understanding. Pt would benefit from further acute OT to facilitate safe dc. Recommend dc to home once medically stable per physician.      Follow Up Recommendations  No OT follow up;Supervision/Assistance - 24 hour    Equipment Recommendations  None recommended by OT    Recommendations for Other Services PT consult     Precautions / Restrictions Precautions Precautions: Fall Precaution Comments: COVID (-) twice Restrictions Weight Bearing Restrictions: No      Mobility Bed Mobility Overal bed mobility: Needs Assistance Bed Mobility: Supine to Sit;Sit to Supine     Supine to sit: Min assist Sit to supine: Min guard   General bed mobility comments: Min Guard A for safety. Min A for pt to pull OT's hand elevate trunk into sitting  Transfers Overall transfer level: Needs assistance Equipment used: 1 person hand held assist Transfers: Sit to/from Stand Sit to Stand: Min assist         General transfer comment: Assist for balance and safety    Balance Overall balance assessment: Needs assistance Sitting-balance support: No upper extremity supported;Feet supported Sitting balance-Leahy Scale: Fair     Standing balance support: Single extremity supported Standing balance-Leahy Scale: Poor Standing balance comment: UE support                            ADL either performed or assessed with clinical judgement   ADL Overall ADL's : Needs assistance/impaired Eating/Feeding: NPO   Grooming: Oral care;Wash/dry hands;Min guard;Standing   Upper Body Bathing: Min guard;Sitting   Lower Body Bathing: Minimal assistance;Sit to/from stand   Upper Body Dressing : Min guard;Sitting   Lower Body Dressing: Minimal assistance;Sit to/from stand Lower Body Dressing Details (indicate cue type and reason): Pt able to don her socks by bringing ankles to knees while supine in bed. Min A for standing balance Toilet Transfer: Minimal assistance;Ambulation;BSC Toilet Transfer Details (indicate cue type and reason): Min A for stability and safety in standing Toileting- Clothing Manipulation and Hygiene: Min guard;Sitting/lateral lean       Functional mobility during ADLs: Minimal assistance General ADL Comments: Pt presenting with near functional performance. However, fatigues quickly and destats on 10L O2. Pt SpO2 100% on 10 L O2 in bed. during activity, she dropped to 94%. Educating pt on purse lip breathing and she returned to 100% once in bed again.     Vision         Perception     Praxis      Pertinent Vitals/Pain Pain Assessment: No/denies pain     Hand Dominance Right   Extremity/Trunk Assessment Upper Extremity Assessment Upper Extremity Assessment: Generalized weakness   Lower Extremity Assessment Lower Extremity Assessment: Defer to PT evaluation       Communication Communication Communication: Prefers language other than [place];Other (comment)(Speak small amount of english)  Cognition Arousal/Alertness: Awake/alert Behavior During Therapy: WFL for tasks assessed/performed Overall Cognitive Status: Within Functional Limits for tasks assessed                                 General Comments: follows commands and initates ADLs WFL   General Comments  SpO2 dropping to 94% on  10L during activity. Cueing for purse lip breathing and pt elevating back to 100% at rest    Exercises     Shoulder Instructions      Home Living Family/patient expects to be discharged to:: Private residence Living Arrangements: Children Available Help at Discharge: Family;Available 24 hours/day Type of Home: House Home Access: Stairs to enter [employer] of Steps: 1   Home Layout: Two level;Bed/bath upstairs Alternate Level Stairs-Number of Steps: flight Alternate Level Stairs-Rails: Right Bathroom Shower/Tub: Tub/shower unit         Home Equipment: Walker - 2 wheels   Additional Comments: rollator vs 2 wheeled walker, daughter unsure      Prior Functioning/Environment Level of Independence: Needs assistance  Gait / Transfers Assistance Needed: modified independent without assistive device. Up/down stairs modified independent. Fatigues in community. ADL's / Homemaking Assistance Needed: Modified independent with dressing. Gets down into tub for bath. Fixes meals on non dialysis days   Comments: Information collect from PT phone interview on 4/20        OT Problem List: Decreased strength;Decreased range of motion;Decreased activity tolerance;Impaired balance (sitting and/or standing);Decreased knowledge of use of DME or AE;Decreased knowledge of precautions      OT Treatment/Interventions: Self-care/ADL training;Therapeutic exercise;Energy conservation;DME and/or AE instruction;Therapeutic activities;Patient/family education    OT Goals(Current goals can be found in the care plan section) Acute Rehab OT Goals Patient Stated Goal: family wants pt to return home OT Goal Formulation: With patient Time For Goal Achievement: 09/24/18 Potential to Achieve Goals: Good  OT Frequency: Min 3X/week   Barriers to D/C:            Co-evaluation              AM-PAC OT "6 Clicks" Daily Activity     Outcome Measure Help from another person eating meals?:  Total Help from another person taking care of personal grooming?: A Little Help from another person toileting, which includes using toliet, bedpan, or urinal?: A Little Help from another person bathing (including washing, rinsing, drying)?: A Little Help from another person to put on and taking off regular upper body clothing?: A Little Help from another person to put on and taking off regular lower body clothing?: A Little 6 Click Score: 16   End of Session Equipment Utilized During Treatment: Oxygen(10L) Nurse Communication: Mobility status  Activity Tolerance: Patient tolerated treatment well;Patient limited by fatigue Patient left: in bed;with call bell/phone within reach;with bed alarm set  OT Visit Diagnosis: Unsteadiness on feet (R26.81);Other abnormalities of gait and mobility (R26.89);Muscle weakness (generalized) (M62.81)                Time: 6237-6283 OT Time Calculation (min): 22 min Charges:  OT General Charges $OT Visit: 1 Visit OT Evaluation $OT Eval Moderate Complexity: [place], OTR/L Acute Rehab Pager: [phone] Office: [place] 09/10/2018, 11:26 AM

## 2018-09-10 NOTE — Progress Notes (Signed)
FPTS Interim Progress Note  S:Patient unable to get HD today. Continues to remain HTN despite current regimen of norvasc 10, metoprolol 50 bid, hydralazine 25 q6h. Spoke to Dr. Augustin Coupe, nephrologist on call, to discuss BP regimen. Per Dr. Augustin Coupe recommended increasing Hydralazine 50mg  q6h. Recommended using clonidine PRN for breakthrough.   O: BP (!) 184/82 (BP Location: Right Arm)   Pulse 95   Temp 99.1 F (37.3 C) (Oral)   Resp (!) 26   Wt 76 kg   SpO2 96%   BMI 32.72 kg/m     A/P: Hypertensive emergency -norvasc 10 -metoprolol 50mg  bid -increase hydralazine 50mg  q6h -if continues to have elevated BP can use clonidine for breakthrough BP  Caroline More, DO 09/10/2018, 7:46 PM PGY-2, [place] [facility] pager [phone]

## 2018-09-10 NOTE — Progress Notes (Signed)
Dialysis Coordinator notified OP HD clinic staff of negative COVID 19 test results (x2) to ensure safety and continuity of care.  Alphonzo Cruise Dialysis Coordinator [phone]

## 2018-09-10 NOTE — Progress Notes (Addendum)
Family Medicine Teaching Service Daily Progress Note Intern Pager: [phone]  Patient name: Alexa Dougherty record number: [id number] Date of birth: 08-10-1945 Age: 73 y.o. Gender: female  Primary Care Provider: Bartholome Bill, MD Consultants: Nephrology, curbside ID, CCM Code Status: Full   Pt Overview and Major Events to Date:  Admitted to [facility] on 4/20  Assessment and Plan: Jerilynn Feldmeier is a 73 y.o. female presenting with hypertensive emergency. 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  SOB  Likely fluid overload. Initially PUI COVID r/o. Initial SARS-CoV2 negative, send out COVID pending. Did have mildly elevated D-Dimer to 1.17  Patient is ESRD, therefore could be 2/2 to this.  Also found to have bilateral lung opacities on chest x-ray, CTA showing bilateral patchy airspace opacities concerning for multifocal pneumonia. ID curbside consulted and recommended starting plaquenil for possible COVID treatment. ON patient with charted desat to 85% on 5L HFNC, now requiring 15L NRB. CCM consulted given worsening O2 requirement, will plan to see patient. Per CCM was is not COVID given low probability and 2 negative COVID tests. Recommend w/u for other etiologies, most likely cardiac (HF with volume overload. Have taken patient off isolation and recommended dc of plaquenil. Called outpatient HD, COVID+ patient but unlikely patient had exposure to this patient. Per Dr. Johnnye Sima, ID, ok to dc isolation precautions and plaquenil. Would consider CXR after diuresis to validate findings. Will continue to follow WBC and temp.  -CCM consulted, appreciate recommendations  -discontinue plaquenil, watch QTc closely   -Monitor temperature curve -Tylenol PRN - consult ID  -continue vanc/cefepime (4/20-) for multifocal pneumonia -nephrology contacted for HD, per Dr. Marval Regal will try to get patient into HD today if schedule allows  -echo ordered  Hypertensive emergency BP improving after HD. Requiring nitro drip on admission but dc on 4/20.  Endorgan damage consistent with pulmonary edema as evidenced on chest x-ray and oxygen desaturations. ON BP continued to stay elevated, PO hydralazine started -nephrology consulted, appreciate recommendations  -continue home norvasc/metoprolol  -continue hydralazine 25 mg q6h  -monitor closely   ESRD with HD MWF.  Renal consulted in ED. -Recommendations per nephrology  GERD -Protonix 40 mg   Hyperlipidemia Lipid Panel with LDL 66, no need to adjust atorvastatin at this time.  -Continue home atorvastatin  Language barrier Speaks Laotian -will need interpretor for encounters   FEN/GI: N.p.o. while on NRB Prophylaxis: SQ Heparin  Disposition: continued inpatient stay   Subjective:  Unable to get subjective, language barrier and patient working with OT at the time   Objective: Temp:  [98.2 F (36.8 C)-99.6 F (37.6 C)] 99.2 F (37.3 C) (04/21 1210) Pulse Rate:  [73-88] 81 (04/21 1210) Resp:  [17-37] 27 (04/21 1210) BP: (130-188)/(63-95) 180/81 (04/21 1210) SpO2:  [83 %-100 %] 95 % (04/21 1210) Physical exam by Dr. Ardelia Mems  Laboratory: Recent Labs  Lab 09/08/18 2350 09/09/18 0032 09/09/18 0428 09/10/18 0633  WBC 12.7*  --  15.7* 15.6*  HGB 11.0* 11.9* 10.4* 10.2*  HCT 37.5 35.0* 33.5* 32.8*  PLT 219  --  190 159   Recent Labs  Lab 09/08/18 2350 09/09/18 0032 09/09/18 0428 09/10/18 0633  NA 133* 137 137 137  K 4.6 4.7 6.2* 5.7*  CL 97*  --  97* 97*  CO2 22  --  23  18*  BUN 59*  --  63* 38*  CREATININE 10.54*  --  10.51* 7.80*  CALCIUM 9.4  --  9.3 9.0  PROT 7.8  --   --   --   BILITOT 0.6  --   --   --   ALKPHOS 152*   --   --   --   ALT 24  --   --   --   AST 34  --   --   --   GLUCOSE 126*  --  119* 102*     Imaging/Diagnostic Tests: Ct Angio Chest Pe W Or Wo Contrast  Addendum Date: 09/09/2018   ADDENDUM REPORT: 09/09/2018 16:59 ADDENDUM: Critical Value/emergent results were called by telephone at the time of interpretation on 09/09/2018 at 4:59 pm to Dr. Chrisandra Netters , who verbally acknowledged these results. Electronically Signed   By: Marijo Conception M.D.   On: 09/09/2018 16:59   Result Date: 09/09/2018 CLINICAL DATA:  Shortness of breath. EXAM: CT ANGIOGRAPHY CHEST WITH CONTRAST TECHNIQUE: Multidetector CT imaging of the chest was performed using the standard protocol during bolus administration of intravenous contrast. Multiplanar CT image reconstructions and MIPs were obtained to evaluate the vascular anatomy. CONTRAST:  67mL OMNIPAQUE IOHEXOL 350 MG/ML SOLN COMPARISON:  Radiograph of same day. FINDINGS: Cardiovascular: Satisfactory opacification of the pulmonary arteries to the segmental level. No evidence of pulmonary embolism. Mild cardiomegaly is noted. No pericardial effusion. Atherosclerosis of thoracic aorta is noted without aneurysm or dissection. Mediastinum/Nodes: No enlarged mediastinal, hilar, or axillary lymph nodes. Thyroid gland, trachea, and esophagus demonstrate no significant findings. Lungs/Pleura: No pneumothorax or pleural effusion is noted. Patchy airspace opacities are noted throughout both lungs with relative sparing of the left lung base. These findings are concerning for multifocal pneumonia. Upper Abdomen: Minimal cholelithiasis is noted. Right renal atrophy is noted consistent with history of end-stage renal disease. Musculoskeletal: No chest wall abnormality. No acute or significant osseous findings. Review of the MIP images confirms the above findings. IMPRESSION: No definite evidence of pulmonary embolus. Bilateral patchy airspace opacities are noted concerning for  multifocal pneumonia. There are a spectrum of findings in the lungs which can be seen with acute atypical infection (as well as other non-infectious etiologies). In particular, viral pneumonia (including COVID-19) should be considered in the appropriate clinical setting. Minimal cholelithiasis. Aortic Atherosclerosis (ICD10-I70.0). Electronically Signed: By: Marijo Conception M.D. On: 09/09/2018 16:52   Portable Chest 1 View  Result Date: 09/10/2018 CLINICAL DATA:  73 year old female with CHF versus pneumonia EXAM: PORTABLE CHEST 1 VIEW COMPARISON:  Chest x-ray and CT scan of the chest 09/09/2018 FINDINGS: Interval progression of diffuse bilateral patchy airspace opacities most notably in the left mid lung. Stable cardiomegaly. No definite pleural effusion or pneumothorax. A metallic stent is present in the region of the left axillary vein. No acute osseous abnormality. IMPRESSION: Progressive patchy bilateral interstitial and airspace opacities. The asymmetry favors a multifocal pneumonia or pneumonitis over pure congestive heart failure although a degree of superimposed pulmonary edema is likely in the setting of cardiomegaly. Electronically Signed   By: Jacqulynn Cadet M.D.   On: 09/10/2018 08:07   Dg Chest Port 1 View  Result Date: 09/09/2018 CLINICAL DATA:  Shortness of breath EXAM: PORTABLE CHEST 1 VIEW COMPARISON:  09/01/2016 FINDINGS: Cardiomegaly. Bilateral airspace opacities are noted, likely edema. Suspect small effusions. No acute bony abnormality. IMPRESSION: Cardiomegaly with bilateral airspace opacities, favor edema/CHF. Small effusions. Electronically Signed   By: Rolm Baptise  M.D.   On: 09/09/2018 00:21     Caroline More, DO 09/10/2018, 12:13 PM PGY-2, [place] Intern pager: [phone], text pages welcome

## 2018-09-10 NOTE — Progress Notes (Signed)
Cardiology  Patients history and hospital course reviewed Echo has been ordered.   With current status and  COVID r/o would recommend deferring echocardiogram for now. Echo findings would not appear to affect immediate medical care.     Please call with questions.  Dorris Carnes, MD, [facility]

## 2018-09-11 LAB — CBC WITH DIFFERENTIAL/PLATELET
Abs Immature Granulocytes: 0.06 10*3/uL (ref 0.00–0.07)
Basophils Absolute: 0 10*3/uL (ref 0.0–0.1)
Basophils Relative: 0 %
Eosinophils Absolute: 0.1 10*3/uL (ref 0.0–0.5)
Eosinophils Relative: 1 %
HCT: 28.7 % — ABNORMAL LOW (ref 36.0–46.0)
Hemoglobin: 9.2 g/dL — ABNORMAL LOW (ref 12.0–15.0)
Immature Granulocytes: 1 %
Lymphocytes Relative: 13 %
Lymphs Abs: 1.7 10*3/uL (ref 0.7–4.0)
MCH: 26.4 pg (ref 26.0–34.0)
MCHC: 32.1 g/dL (ref 30.0–36.0)
MCV: 82.5 fL (ref 80.0–100.0)
Monocytes Absolute: 1.2 10*3/uL — ABNORMAL HIGH (ref 0.1–1.0)
Monocytes Relative: 9 %
Neutro Abs: 10.2 10*3/uL — ABNORMAL HIGH (ref 1.7–7.7)
Neutrophils Relative %: 76 %
Platelets: 186 10*3/uL (ref 150–400)
RBC: 3.48 MIL/uL — ABNORMAL LOW (ref 3.87–5.11)
RDW: 18.8 % — ABNORMAL HIGH (ref 11.5–15.5)
WBC: 13.3 10*3/uL — ABNORMAL HIGH (ref 4.0–10.5)
nRBC: 0 % (ref 0.0–0.2)

## 2018-09-11 LAB — BASIC METABOLIC PANEL
Anion gap: 19 — ABNORMAL HIGH (ref 5–15)
BUN: 61 mg/dL — ABNORMAL HIGH (ref 8–23)
CO2: 23 mmol/L (ref 22–32)
Calcium: 9 mg/dL (ref 8.9–10.3)
Chloride: 92 mmol/L — ABNORMAL LOW (ref 98–111)
Creatinine, Ser: 10.17 mg/dL — ABNORMAL HIGH (ref 0.44–1.00)
GFR calc Af Amer: 4 mL/min — ABNORMAL LOW (ref >60)
GFR calc non Af Amer: 3 mL/min — ABNORMAL LOW (ref >60)
Glucose, Bld: 127 mg/dL — ABNORMAL HIGH (ref 70–99)
Potassium: 4.1 mmol/L (ref 3.5–5.1)
Sodium: 134 mmol/L — ABNORMAL LOW (ref 135–145)

## 2018-09-11 LAB — GLUCOSE, CAPILLARY: Glucose-Capillary: 100 mg/dL — ABNORMAL HIGH (ref 70–99)

## 2018-09-11 LAB — PROCALCITONIN: Procalcitonin: 1.73 ng/mL

## 2018-09-11 MED ORDER — ONDANSETRON HCL 4 MG/2ML IJ SOLN
4.0000 mg | Freq: Once | INTRAMUSCULAR | Status: AC
  Administered 2018-09-11: 4 mg via INTRAVENOUS

## 2018-09-11 MED ORDER — SODIUM CHLORIDE 0.9 % IV SOLN
2.0000 g | Freq: Once | INTRAVENOUS | Status: AC
  Administered 2018-09-11: 2 g via INTRAVENOUS
  Filled 2018-09-11: qty 2

## 2018-09-11 MED ORDER — VANCOMYCIN HCL IN DEXTROSE 750-5 MG/150ML-% IV SOLN: 750.0000 mg | INTRAVENOUS | Status: DC

## 2018-09-11 MED ORDER — SODIUM CHLORIDE 0.9 % IV SOLN
2.0000 g | INTRAVENOUS | Status: DC
  Filled 2018-09-11: qty 2

## 2018-09-11 MED ORDER — VANCOMYCIN HCL IN DEXTROSE 750-5 MG/150ML-% IV SOLN
750.0000 mg | Freq: Once | INTRAVENOUS | Status: AC
  Administered 2018-09-11: 750 mg via INTRAVENOUS
  Filled 2018-09-11: qty 150

## 2018-09-11 MED ORDER — LORAZEPAM 2 MG/ML IJ SOLN
0.5000 mg | Freq: Once | INTRAMUSCULAR | Status: AC
  Administered 2018-09-11: 0.5 mg via INTRAVENOUS
  Filled 2018-09-11: qty 1

## 2018-09-11 NOTE — Progress Notes (Addendum)
Family Medicine Teaching Service Daily Progress Note Intern Pager: [phone]  Patient name: Alexa Dougherty record number: [id number] Date of birth: 10-Aug-1945 Age: 73 y.o. Gender: female  Primary Care Provider: Bartholome Bill, MD Consultants: Nephrology, curbside ID, CCM Code Status: Full   Pt Overview and Major Events to Date:  Admitted to [place] on 4/20  Assessment and Plan: Brityn Mastrogiovanni is a 73 y.o. female presenting with hypertensive emergency. 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  Acute Hypoxic Respiratory Failure COVID in-house test and send out were negative.  CCM assessed patient yesterday and believe fluid overload as cause of shortness of breath.  Dr. Johnnye Sima with ID agreed that Plaquenil and precautions could be discontinued.  Likely secondary to fluid overload, patient is ESRD, therefore expect to improve with HD.  Patient unable to receive HD yesterday, is receiving HD today.  Pneumonia is also on differential given CT read showing evidence of multifocal pneumonia and groundglass opacities.  Patient remains on broad-spectrum antibiotics for coverage of multifocal pneumonia.  WBC improved,125.6>13.3.  Echo on 4/21 with EF of 60 to 65%, indeterminate diastolic dysfunction, mild concentric LVH, therefore doubt CHF as cause.  Respiratory status improved overnight, patient remained on 8 L HF [place], improved from nonrebreather yesterday morning.  This a.m. patient denies SOB and states that her breathing is doing well.   -CCM consulted, appreciate recommendations   -Monitor temperature curve -Tylenol PRN - CCM d/c'ed vanc/cefepime (4/20-4/21), will restart - rechecked procal: 0.58>1.73,  will restart abx -PT/OT consult: HHPT, but may improve  Hypertensive emergency: Improving BP overnight 149-177/65-90.  Endorgan damage noted by pulmonary edema on chest x-ray, CT, and oxygen desaturation.  Patient's blood pressure improving with HD.  We will continue to monitor after HD. -nephrology consulted, appreciate recommendations  -continue home norvasc/metoprolol  -continue hydralazine 50 mg q6h  -monitor closely  - nitro patch started by CCM yesterday, will d/c today  ESRD with HD MWF.  Renal consulted in ED. -Recommendations per nephrology  GERD -Protonix 40 mg   Hyperlipidemia Lipid Panel with LDL 66, no need to adjust atorvastatin at this time.  -Continue home atorvastatin  Language barrier Speaks Laotian -will need interpretor for encounters   FEN/GI: N.p.o. while on NRB Prophylaxis: SQ Heparin  Disposition: continued inpatient stay   Subjective:  Patient in HD.  Complaining of dizziness from HD, but states that breathing is fine.  No other complaints this AM.  Patient declined interpretor at this time.  She did have some anxiety overnight that improved with soothing measures and ativan 0.5mg .    Objective: Temp:  [98 F (36.7 C)-99.2 F (37.3 C)] 98.1 F (36.7 C) (04/22 0700) Pulse Rate:  [73-95] 81 (04/22 0730) Resp:  [18-33] 18 (04/22 0700) BP: (125-184)/(63-90) 149/65 (04/22 0730) SpO2:  [95 %-100 %] 98 % (04/22 0700) Weight:  [74.6 kg] 74.6 kg (04/22 0700)  Physical Exam:  General: 73 y.o. female moaning 2/2 dizziness from HD, on HD at the time Cardio: RRR no m/r/g Lungs: CTAB, no wheezing, no rhonchi, no crackles, no increased WOB Abdomen: Soft, non-tender to palpation, positive bowel sounds Skin: warm and dry Extremities: No edema   Laboratory: Recent Labs  Lab 09/09/18 0428 09/10/18 0633 09/11/18 0546  WBC 15.7* 15.6* 13.3*  HGB 10.4* 10.2* 9.2*  HCT 33.5* 32.8*  28.7*  PLT 190 159 186   Recent Labs  Lab 09/08/18 2350   09/09/18 0428 09/10/18 0633 09/11/18 0546  NA 133*   < > 137 137 134*  K 4.6   < > 6.2* 5.7* 4.1  CL 97*  --  97* 97* 92*  CO2 22  --  23 18* 23  BUN 59*  --  63* 38* 61*  CREATININE 10.54*  --  10.51* 7.80* 10.17*  CALCIUM 9.4  --  9.3 9.0 9.0  PROT 7.8  --   --   --   --   BILITOT 0.6  --   --   --   --   ALKPHOS 152*  --   --   --   --   ALT 24  --   --   --   --   AST 34  --   --   --   --   GLUCOSE 126*  --  119* 102* 127*   < > = values in this interval not displayed.     Imaging/Diagnostic Tests: No results found.   [place], DO 09/11/2018, 7:49 AM PGY-1, [place] Intern pager: [phone], text pages welcome

## 2018-09-11 NOTE — Progress Notes (Signed)
Pharmacy Antibiotic Note  Alexa Dougherty is a 73 y.o. female admitted on 09/08/2018 with pneumonia.  Pharmacy has been consulted for cefepime and vancomycin dosing. Of note, pt has hx of ESRD with HD MWF on schedule inpatient per nephrology.   Plan: restart cefepime 2g qMWF Vancomycin loading dose 1500mg  x1 given after last HD will now restart Vancomycin 750mg  qMWF with HD F/u clinical s/sx improvement, de-escalation, cultures, LOT   Height: 5' (152.4 cm) Weight: 157 lb 13.6 oz (71.6 kg) IBW/kg (Calculated) : 45.5  Temp (24hrs), Avg:98.5 F (36.9 C), Min:97.5 F (36.4 C), Max:99.1 F (37.3 C)  Recent Labs  Lab 09/08/18 2350 09/09/18 0136 09/09/18 0428 09/10/18 0633 09/11/18 0546  WBC 12.7*  --  15.7* 15.6* 13.3*  CREATININE 10.54*  --  10.51* 7.80* 10.17*  LATICACIDVEN 1.7 1.4  --   --   --     Estimated Creatinine Clearance: 4.3 mL/min (A) (by C-G formula based on SCr of 10.17 mg/dL (H)).    No Known Allergies  Antimicrobials this admission: Vanc 4/20 >>4/21, 4/22>>  Cefepime 4/20 >> 4/21, 4/22>>  Dose adjustments this admission:   Microbiology results: 4/20 BCx: pend 4/20 COVID-19: negative 4/20 COVID-19: pend  Dwayne A. Levada Dy, PharmD, [place] Please utilize Amion for appropriate phone number to reach the unit pharmacist ([place])   09/11/2018 2:50 PM

## 2018-09-11 NOTE — Progress Notes (Signed)
Pt. UF off pt. States when sbp drops below 130's during HD tx  becomes symptomatic pt. Current bp 114/56 pt. C/o dizziness. Dr. Marval Regal made aware

## 2018-09-11 NOTE — Progress Notes (Signed)
PT Cancellation Note  Patient Details Name: Alexa Dougherty MRN: [id number] DOB: 1945-11-09   Cancelled Treatment:    Reason Eval/Treat Not Completed: Patient at procedure or test/unavailable. Pt off floor at HD. Acute PT to return as able to progress mobility.  Kittie Plater, PT, DPT Acute Rehabilitation Services Pager #: [phone] Office #: [phone]    Berline Lopes 09/11/2018, 8:40 AM

## 2018-09-11 NOTE — Progress Notes (Signed)
OT Cancellation Note  Patient Details Name: Alexa Dougherty MRN: [id number] DOB: July 28, 1945   Cancelled Treatment:    Reason Eval/Treat Not Completed: Patient at procedure or test/ unavailable(HD. Will return as schedule allows. Thank you.)  [place], OTR/L Acute Rehab Pager: [phone] Office: [phone] 09/11/2018, 7:32 AM

## 2018-09-11 NOTE — Procedures (Signed)
I was present at this dialysis session. I have reviewed the session itself and made appropriate changes.   Vital signs in last 24 hours:  Temp:  [98 F (36.7 C)-99.2 F (37.3 C)] 98.1 F (36.7 C) (04/22 0700) Pulse Rate:  [73-95] 84 (04/22 0815) Resp:  [18-33] 18 (04/22 0700) BP: (114-184)/(56-90) 150/69 (04/22 0815) SpO2:  [95 %-100 %] 98 % (04/22 0700) Weight:  [74.6 kg] 74.6 kg (04/22 0700) Weight change: -3.9 kg Filed Weights   09/09/18 0730 09/09/18 1032 09/11/18 0700  Weight: 78.5 kg 76 kg 74.6 kg    Recent Labs  Lab 09/10/18 0633 09/11/18 0546  NA 137 134*  K 5.7* 4.1  CL 97* 92*  CO2 18* 23  GLUCOSE 102* 127*  BUN 38* 61*  CREATININE 7.80* 10.17*  CALCIUM 9.0 9.0  PHOS 5.0*  --     Recent Labs  Lab 09/08/18 2350  09/09/18 0428 09/10/18 0633 09/11/18 0546  WBC 12.7*  --  15.7* 15.6* 13.3*  NEUTROABS 8.8*  --   --   --  10.2*  HGB 11.0*   < > 10.4* 10.2* 9.2*  HCT 37.5   < > 33.5* 32.8* 28.7*  MCV 87.0  --  86.6 85.6 82.5  PLT 219  --  190 159 186   < > = values in this interval not displayed.    Scheduled Meds: . amLODipine  10 mg Oral Q supper  . atorvastatin  10 mg Oral QPM  . Chlorhexidine Gluconate Cloth  6 each Topical Q0600  . cinacalcet  30 mg Oral QHS  . heparin  5,000 Units Subcutaneous Q8H  . hydrALAZINE  50 mg Oral Q6H  . metoprolol tartrate  50 mg Oral BID  . multivitamin  1 tablet Oral QHS  . nitroGLYCERIN  0.4 mg Transdermal Daily  . pantoprazole  40 mg Oral Daily  . sevelamer carbonate  2,400 mg Oral TID WC  . sodium zirconium cyclosilicate  10 g Oral BID   Continuous Infusions: PRN Meds:.acetaminophen **OR** acetaminophen, acetaminophen    [facility] MWF 3h 73min 400/ 1.5 74kg 2/2.25 bath Heparin none LUA AVF - mircera 75 ug every 2 wks, last ?  Assessment/ Plan:  1. Acute respiratory distress- covid-19 negative x 2 and bilateral lung infiltrates. Improving with HD and UF.  Discussed case with Dr. Lynetta Mare and he  believes that this is mainly related to volume overload.  Will plan for UF with HD as BP tolerates to see if this continues to improve her respiratory status.  2. Hypertensive emergency- receiving IV ntg and hydralazine. UF with HD and follow BP's.  3. ESRD- continue with HD qMWF and follow 4. Anemia:stable 5. CKD-MBD:stable 6. Nutrition:renal diet 7. Hypertension:as above 8. Hyperkalemia- improved with HD, lokelma, and renal diet. 9. Leukocytosis- r/o infectious etiology.   Donetta Potts,  MD 09/11/2018, 8:41 AM

## 2018-09-12 DIAGNOSIS — J189 Pneumonia, unspecified organism: Secondary | ICD-10-CM

## 2018-09-12 DIAGNOSIS — Z992 Dependence on renal dialysis: Secondary | ICD-10-CM

## 2018-09-12 DIAGNOSIS — I501 Left ventricular failure: Secondary | ICD-10-CM

## 2018-09-12 DIAGNOSIS — N186 End stage renal disease: Secondary | ICD-10-CM

## 2018-09-12 DIAGNOSIS — D631 Anemia in chronic kidney disease: Secondary | ICD-10-CM

## 2018-09-12 LAB — CBC
HCT: 27.5 % — ABNORMAL LOW (ref 36.0–46.0)
Hemoglobin: 9 g/dL — ABNORMAL LOW (ref 12.0–15.0)
MCH: 27.3 pg (ref 26.0–34.0)
MCHC: 32.7 g/dL (ref 30.0–36.0)
MCV: 83.3 fL (ref 80.0–100.0)
Platelets: 192 10*3/uL (ref 150–400)
RBC: 3.3 MIL/uL — ABNORMAL LOW (ref 3.87–5.11)
RDW: 18.8 % — ABNORMAL HIGH (ref 11.5–15.5)
WBC: 8.3 10*3/uL (ref 4.0–10.5)
nRBC: 0.2 % (ref 0.0–0.2)

## 2018-09-12 LAB — BASIC METABOLIC PANEL
Anion gap: 15 (ref 5–15)
BUN: 30 mg/dL — ABNORMAL HIGH (ref 8–23)
CO2: 27 mmol/L (ref 22–32)
Calcium: 9.1 mg/dL (ref 8.9–10.3)
Chloride: 95 mmol/L — ABNORMAL LOW (ref 98–111)
Creatinine, Ser: 5.77 mg/dL — ABNORMAL HIGH (ref 0.44–1.00)
GFR calc Af Amer: 8 mL/min — ABNORMAL LOW (ref >60)
GFR calc non Af Amer: 7 mL/min — ABNORMAL LOW (ref >60)
Glucose, Bld: 109 mg/dL — ABNORMAL HIGH (ref 70–99)
Potassium: 3.8 mmol/L (ref 3.5–5.1)
Sodium: 137 mmol/L (ref 135–145)

## 2018-09-12 LAB — MRSA PCR SCREENING: MRSA by PCR: NEGATIVE

## 2018-09-12 MED ORDER — HYDRALAZINE HCL 50 MG PO TABS
50.0000 mg | ORAL_TABLET | Freq: Three times a day (TID) | ORAL | Status: DC
  Administered 2018-09-12 – 2018-09-13 (×3): 50 mg via ORAL
  Filled 2018-09-12 (×3): qty 1

## 2018-09-12 MED ORDER — CEFDINIR 300 MG PO CAPS
300.0000 mg | ORAL_CAPSULE | Freq: Every evening | ORAL | Status: DC
  Administered 2018-09-12: 300 mg via ORAL
  Filled 2018-09-12 (×2): qty 1

## 2018-09-12 MED ORDER — DOXYCYCLINE HYCLATE 100 MG PO TABS
100.0000 mg | ORAL_TABLET | Freq: Two times a day (BID) | ORAL | Status: DC
  Administered 2018-09-12 – 2018-09-13 (×3): 100 mg via ORAL
  Filled 2018-09-12 (×3): qty 1

## 2018-09-12 MED ORDER — SODIUM CHLORIDE 0.9 % IV SOLN: 2.0000 g | INTRAVENOUS | Status: DC

## 2018-09-12 NOTE — Progress Notes (Signed)
Patient ID: Ellysa Parrack, female   DOB: 06-05-1945, 73 y.o.   MRN: [id number] S: Feels better, no complaints O:BP 135/61 (BP Location: Right Arm)   Pulse 84   Temp 98 F (36.7 C) (Oral)   Resp 15   Ht 5' (1.524 m)   Wt 71.6 kg   SpO2 98%   BMI 30.83 kg/m   Intake/Output Summary (Last 24 hours) at 09/12/2018 1015 Last data filed at 09/12/2018 0400 Gross per 24 hour  Intake 500 ml  Output 2760 ml  Net -2260 ml   Intake/Output: I/O last 3 completed shifts: In: 620 [P.O.:220; IV Piggyback:400] Out: 2760 [Other:2760]  Intake/Output this shift:  No intake/output data recorded. Weight change: -3 kg Gen: WD WN NAD CVS:no rub Resp:cta KVQ:QVZDGL Ext: no edema, LAVF +T/B  Recent Labs  Lab 09/08/18 2350 09/09/18 0032 09/09/18 0428 09/10/18 0633 09/11/18 0546 09/12/18 0448  NA 133* 137 137 137 134* 137  K 4.6 4.7 6.2* 5.7* 4.1 3.8  CL 97*  --  97* 97* 92* 95*  CO2 22  --  23 18* 23 27  GLUCOSE 126*  --  119* 102* 127* 109*  BUN 59*  --  63* 38* 61* 30*  CREATININE 10.54*  --  10.51* 7.80* 10.17* 5.77*  ALBUMIN 3.7  --  3.6 3.2*  --   --   CALCIUM 9.4  --  9.3 9.0 9.0 9.1  PHOS  --   --  3.2  3.2 5.0*  --   --   AST 34  --   --   --   --   --   ALT 24  --   --   --   --   --    Liver Function Tests: Recent Labs  Lab 09/08/18 2350 09/09/18 0428 09/10/18 0633  AST 34  --   --   ALT 24  --   --   ALKPHOS 152*  --   --   BILITOT 0.6  --   --   PROT 7.8  --   --   ALBUMIN 3.7 3.6 3.2*   No results for input(s): LIPASE, AMYLASE in the last 168 hours. No results for input(s): AMMONIA in the last 168 hours. CBC: Recent Labs  Lab 09/08/18 2350  09/09/18 0428 09/10/18 8756 09/11/18 0546 09/12/18 0448  WBC 12.7*  --  15.7* 15.6* 13.3* 8.3  NEUTROABS 8.8*  --   --   --  10.2*  --   HGB 11.0*   < > 10.4* 10.2* 9.2* 9.0*  HCT 37.5   < > 33.5* 32.8* 28.7* 27.5*  MCV 87.0  --  86.6 85.6 82.5 83.3  PLT 219  --  190 159 186 192   < > = values in this interval not  displayed.   Cardiac Enzymes: Recent Labs  Lab 09/09/18 0409 09/10/18 0633  TROPONINI <0.03 <0.03   CBG: Recent Labs  Lab 09/11/18 0528  GLUCAP 100*    Iron Studies: No results for input(s): IRON, TIBC, TRANSFERRIN, FERRITIN in the last 72 hours. Studies/Results: No results found. Marland Kitchen amLODipine  10 mg Oral Q supper  . atorvastatin  10 mg Oral QPM  . Chlorhexidine Gluconate Cloth  6 each Topical Q0600  . cinacalcet  30 mg Oral QHS  . heparin  5,000 Units Subcutaneous Q8H  . hydrALAZINE  50 mg Oral Q6H  . metoprolol tartrate  50 mg Oral BID  . multivitamin  1 tablet Oral QHS  .  pantoprazole  40 mg Oral Daily  . sevelamer carbonate  2,400 mg Oral TID WC  . sodium zirconium cyclosilicate  10 g Oral BID    BMET    Component Value Date/Time   NA 137 09/12/2018 0448   K 3.8 09/12/2018 0448   CL 95 (L) 09/12/2018 0448   CO2 27 09/12/2018 0448   GLUCOSE 109 (H) 09/12/2018 0448   BUN 30 (H) 09/12/2018 0448   CREATININE 5.77 (H) 09/12/2018 0448   CALCIUM 9.1 09/12/2018 0448   CALCIUM 8.3 (L) 12/14/2011 1634   GFRNONAA 7 (L) 09/12/2018 0448   GFRAA 8 (L) 09/12/2018 0448   CBC    Component Value Date/Time   WBC 8.3 09/12/2018 0448   RBC 3.30 (L) 09/12/2018 0448   HGB 9.0 (L) 09/12/2018 0448   HCT 27.5 (L) 09/12/2018 0448   HCT 27.2 (L) 09/02/2016 0347   PLT 192 09/12/2018 0448   MCV 83.3 09/12/2018 0448   MCH 27.3 09/12/2018 0448   MCHC 32.7 09/12/2018 0448   RDW 18.8 (H) 09/12/2018 0448   LYMPHSABS 1.7 09/11/2018 0546   MONOABS 1.2 (H) 09/11/2018 0546   EOSABS 0.1 09/11/2018 0546   BASOSABS 0.0 09/11/2018 0546     [facility] MWF 3h 62min 400/ 1.5 74kg 2/2.25 bath Heparin none LUA AVF - mircera 75 ug every 2 wks, last ?  Assessment/ Plan:  1. Acute respiratory distress- covid-19 negative x 2 and bilateral lung infiltrates. Improving with HD and UF.  She is now 2.4kg below edw and markedly improved.  Plan for HD again tomorrow and challenge edw.   2. Hypertensive emergency- receiving IV ntg and hydralazine.  1. Markedly improved with UF on HD.  3. ESRD- continue with HD qMWF 4. Anemia:stable 5. CKD-MBD:stable 6. Nutrition:renal diet 7. Hypertension:as above 8. Hyperkalemia- improved with HD, lokelma, and renal diet.  Will stop lokelma and follow with HD. 9. Leukocytosis- r/oinfectious etiology.   Donetta Potts, MD [employer] [phone]

## 2018-09-12 NOTE — Care Management Important Message (Signed)
Important Message  Patient Details  Name: Alexa Dougherty MRN: [id number] Date of Birth: December 15, 1945   Medicare Important Message Given:  Yes    Zenon Mayo, RN 09/12/2018, 1:00 PM

## 2018-09-12 NOTE — Progress Notes (Signed)
Occupational Therapy Treatment Patient Details Name: Alexa Dougherty MRN: [id number] DOB: [birth year] Today's Date: 09/12/2018    History of present illness Pt adm with acute respiratory distress and hypertensive urgency. Covid neg x 2. PMH - ESRD on HD, HTN   OT comments  Pt performing bedmobility with minguardA for trunk elevation and transfers and ADL functional mobility with RW and minguardA. Pt stood for toilet hygiene and minguardA for grooming tasks in standing with fair balance at sink. Pt tolerating sessionw ell with SpO2 levels >86% on RA and requirse 2L O2 ro recover. Pt progressing very well and working towards goals. Pt continues to require OT skilled services for ADL, mobility and safety in [place] setting. Granddaughter called and OTR used her as Optometrist for a few minutes to follow commands. Pt's HR <126 BPM furing exertion. OT to follow acutely.     Follow Up Recommendations  Home health OT;Supervision/Assistance - 24 hour    Equipment Recommendations  None recommended by OT    Recommendations for Other Services      Precautions / Restrictions Precautions Precautions: Fall Precaution Comments: COVID (-) twice Restrictions Weight Bearing Restrictions: No       Mobility Bed Mobility Overal bed mobility: Needs Assistance Bed Mobility: Supine to Sit;Sit to Supine     Supine to sit: Min guard;HOB elevated Sit to supine: Min guard   General bed mobility comments: minguardA for trunk elevation  Transfers Overall transfer level: Needs assistance Equipment used: Rolling walker (2 wheeled) Transfers: Sit to/from Stand Sit to Stand: Supervision         General transfer comment: Assist for balance and safety    Balance Overall balance assessment: Needs assistance Sitting-balance support: No upper extremity supported;Feet supported Sitting balance-Leahy Scale: Fair     Standing balance support: Single extremity supported Standing balance-Leahy Scale:  Fair Standing balance comment: UE support                           ADL either performed or assessed with clinical judgement   ADL Overall ADL's : Needs assistance/impaired     Grooming: Set up;Wash/dry hands;Wash/dry face;Oral care;Standing                   Toilet Transfer: Nature conservation officer;Ambulation   Toileting- Clothing Manipulation and Hygiene: Min guard;Cueing for safety;Sitting/lateral lean;Sit to/from stand       Functional mobility during ADLs: Min guard;Rolling walker General ADL Comments: 86% on RA so Pt requires O2 skilled services. Pt performing grooming and toileting tasks with increased independence with all functional tasks.     Vision   Vision Assessment?: No apparent visual deficits   Perception     Praxis      Cognition Arousal/Alertness: Awake/alert Behavior During Therapy: WFL for tasks assessed/performed Overall Cognitive Status: Within Functional Limits for tasks assessed                                 General Comments: follows commands and initates ADLs [facility]        Exercises     Shoulder Instructions       General Comments hr increasess to 110 BPm with activity and pt desats to 86% on RA requiring 2L O2 >90%    Pertinent Vitals/ Pain       Pain Assessment: No/denies pain  Home Living  Prior Functioning/Environment              Frequency  Min 3X/week        Progress Toward Goals  OT Goals(current goals can now be found in the care plan section)  Progress towards OT goals: Progressing toward goals  Acute Rehab OT Goals Patient Stated Goal: family wants pt to return home OT Goal Formulation: With patient Time For Goal Achievement: 09/24/18 Potential to Achieve Goals: Good ADL Goals Pt Will Perform Grooming: with modified independence;standing Pt Will Perform Lower Body Dressing: with modified independence;sit to/from  stand Pt Will Transfer to Toilet: with modified independence;ambulating;regular height toilet Additional ADL Goal #1: Pt will demonstrate increased activity tolerance to perform three ADLs in standing with supervision Additional ADL Goal #2: Pt and family will verbalize three energy conseravtion techniques for ADLs  Plan Discharge plan remains appropriate    Co-evaluation                 AM-PAC OT "6 Clicks" Daily Activity     Outcome Measure   Help from another person eating meals?: Total Help from another person taking care of personal grooming?: A Little Help from another person toileting, which includes using toliet, bedpan, or urinal?: A Little Help from another person bathing (including washing, rinsing, drying)?: A Little Help from another person to put on and taking off regular upper body clothing?: A Little Help from another person to put on and taking off regular lower body clothing?: A Little 6 Click Score: 16    End of Session Equipment Utilized During Treatment: Rolling walker  OT Visit Diagnosis: Unsteadiness on feet (R26.81);Other abnormalities of gait and mobility (R26.89);Muscle weakness (generalized) (M62.81)   Activity Tolerance Patient tolerated treatment well   Patient Left in chair;with call bell/phone within reach;with chair alarm set   Nurse Communication Mobility status        Time: 8016-5537 OT Time Calculation (min): 25 min  Charges: OT General Charges $OT Visit: 1 Visit OT Treatments $Self Care/Home Management : 23-37 mins  Alexa Hail Harold Hedge) Marsa Aris OTR/L Acute Rehabilitation Services Pager: [phone] Office: [phone]    Alexa Dougherty 09/12/2018, 4:49 PM

## 2018-09-12 NOTE — Progress Notes (Signed)
Physical Therapy Treatment Patient Details Name: Alexa Dougherty MRN: [id number] DOB: 02/03/1946 Today's Date: 09/12/2018    History of Present Illness Pt adm with acute respiratory distress and hypertensive urgency. Covid neg x 2. PMH - ESRD on HD, HTN    PT Comments    Patient doing well with therapy today, ambulating hallway with supervision. De sat on RA to 85%, remains above 90 on 2L.    Follow Up Recommendations  Supervision for mobility/OOB;Home health PT     Equipment Recommendations  Other (comment)    Recommendations for Other Services       Precautions / Restrictions Precautions Precautions: Fall Precaution Comments: COVID (-) twice Restrictions Weight Bearing Restrictions: No    Mobility  Bed Mobility Overal bed mobility: Needs Assistance Bed Mobility: Supine to Sit;Sit to Supine     Supine to sit: Min assist Sit to supine: Min guard   General bed mobility comments: Min Guard A for safety. Min A for pt to pull OT's hand elevate trunk into sitting  Transfers Overall transfer level: Needs assistance Equipment used: 1 person hand held assist Transfers: Sit to/from Stand Sit to Stand: Min assist         General transfer comment: Assist for balance and safety  Ambulation/Gait Ambulation/Gait assistance: Min guard Gait Distance (Feet): 75 Feet Assistive device: 1 person hand held assist   Gait velocity: decreased   General Gait Details: mild HHA, on RA, desat to 80%   Stairs             Wheelchair Mobility    Modified Rankin (Stroke Patients Only)       Balance Overall balance assessment: Needs assistance Sitting-balance support: No upper extremity supported;Feet supported Sitting balance-Leahy Scale: Fair     Standing balance support: Single extremity supported Standing balance-Leahy Scale: Poor Standing balance comment: UE support                            Cognition Arousal/Alertness: Awake/alert Behavior  During Therapy: WFL for tasks assessed/performed Overall Cognitive Status: Within Functional Limits for tasks assessed                                 General Comments: follows commands and initates ADLs WFL      Exercises      General Comments        Pertinent Vitals/Pain      Home Living                      Prior Function            PT Goals (current goals can now be found in the care plan section) Acute Rehab PT Goals Patient Stated Goal: family wants pt to return home PT Goal Formulation: With patient/family Time For Goal Achievement: 09/23/18 Potential to Achieve Goals: Good Progress towards PT goals: Progressing toward goals    Frequency    Min 3X/week      PT Plan      Co-evaluation              AM-PAC PT "6 Clicks" Mobility   Outcome Measure  Help needed turning from your back to your side while in a flat bed without using bedrails?: None Help needed moving from lying on your back to sitting on the side of a flat bed without using bedrails?: A Little  Help needed moving to and from a bed to a chair (including a wheelchair)?: A Little Help needed standing up from a chair using your arms (e.g., wheelchair or bedside chair)?: A Little Help needed to walk in hospital room?: A Little Help needed climbing 3-5 steps with a railing? : A Lot 6 Click Score: 18    End of Session Equipment Utilized During Treatment: Oxygen Activity Tolerance: Patient limited by fatigue Patient left: in bed;with call bell/phone within reach Nurse Communication: Mobility status PT Visit Diagnosis: Unsteadiness on feet (R26.81);Other abnormalities of gait and mobility (R26.89);Muscle weakness (generalized) (M62.81)     Time: 1210-1222 PT Time Calculation (min) (ACUTE ONLY): 12 min  Charges:  $Gait Training: 8-22 mins                    Reinaldo Berber, PT, DPT Acute Rehabilitation Services Pager: [phone] Office: [phone]    Reinaldo Berber 09/12/2018, 2:28 PM

## 2018-09-12 NOTE — Care Management Important Message (Signed)
Important Message  Patient Details  Name: Alexa Dougherty MRN: [id number] Date of Birth: [birth year]   Medicare Important Message Given:  Yes    Alexa Dougherty 09/12/2018, 1:55 PM

## 2018-09-12 NOTE — Progress Notes (Addendum)
Family Medicine Teaching Service Daily Progress Note Intern Pager: [phone]  Patient name: Alexa Dougherty record number: [id number] Date of birth: 07/23/1945 Age: 73 y.o. Gender: female  Primary Care Provider: Bartholome Bill, MD Consultants: Nephrology, curbside ID, CCM Code Status: Full   Pt Overview and Major Events to Date:  Admitted to [facility] on 4/20  Assessment and Plan: Allean Montfort is a 73 y.o. female presenting with hypertensive emergency. PMH is significant for  has a past medical history of Anemia, Arthritis, Chronic kidney disease, Colon polyps, Dialysis patient ([place]), Elevated cholesterol, Gastritis and gastroduodenitis (12/29/2014), GERD (gastroesophageal reflux disease), Headache(784.0), adenomatous colonic polyps (01/04/2015), Hyperlipemia, and Hypertension.  Acute Hypoxic Respiratory Failure: Improving Likely 2/2 fluid overload vs multifocal pneumonia seen on CTA 4/20.  Antibiotics restarted on 4/22 after brief discontinuation on 4/21 due to procal increased from 0.58>1.73.  WBC now resolved 13.3>8.3.  Remains afebrile.  On HFNC 4L overnight, improved from 8L yesterday.  On RA and satting in 90s while examined this AM. Had HD yesterday, likely source of most improvement in resp status.  -CCM consulted, appreciate recommendations   -Monitor temperature curve - monitor WBC -Tylenol PRN - d/c vanc and cefepime (4/20-4/22) - start PO cefdinir and doxycyline (4/23- ), recommend 7 days treatment -PT/OT consult: HHPT, but may improve - f/u blood cx: NG3D  Hypertensive emergency with Pulmonary Edema: Improving BP improved following HD on 4/22, 124-148/53-69 since that time.  This AM 126/55.  Nitro patch placed by CCM was discontinued yesterday, patient's BP has been doing well in the setting of this discontinuation and HD. -nephrology consulted, appreciate recommendations  -continue home norvasc/metoprolol  -continue hydralazine 50 mg, decrease from q6h to  q8h -monitor closely   ESRD with HD MWF.  Renal consulted in ED. -Recommendations per nephrology  Chronic Normocytic Anemia  Basline per chart review about 10-11.  Likely 2/2 ESRD.  9.2>9.0. - monitor CBC  GERD -Protonix 40 mg   Hyperlipidemia Lipid Panel with LDL 66, no need to adjust atorvastatin at this time.  -Continue home atorvastatin  Language barrier Speaks Laotian -will need interpretor for encounters   FEN/GI: Renal Diet Prophylaxis: SQ Heparin  Disposition: continued inpatient stay, pending ability to tolerate RA throughout the day, likely home tomorrow  Subjective:  Patient states that she is feeling "much better" this AM.  Denies breathing complaints.    Objective: Temp:  [97.5 F (36.4 C)-99.3 F (37.4 C)] 97.7 F (36.5 C) (04/23 0326) Pulse Rate:  [78-103] 82 (04/23 0326) Resp:  [18-28] 22 (04/23 0326) BP: (108-159)/(50-79) 126/55 (04/23 0326) SpO2:  [96 %-99 %] 99 % (04/23 0326) Weight:  [71.6 kg] 71.6 kg (04/22 1105)  Physical Exam: General: 73 y.o. female in NAD Cardio: RRR no m/r/g Lungs: CTAB, no wheezing, no rhonchi, no crackles, no increased WOB on RA Abdomen: Soft, non-tender to palpation, positive bowel sounds Skin: warm and dry Extremities: No edema    Laboratory: Recent Labs  Lab 09/10/18 0633 09/11/18 0546 09/12/18 0448  WBC 15.6* 13.3* 8.3  HGB 10.2* 9.2* 9.0*  HCT 32.8* 28.7* 27.5*  PLT 159 186 192   Recent Labs  Lab 09/08/18 2350  09/10/18 0633 09/11/18 0546 09/12/18 0448  NA 133*   < > 137 134* 137  K 4.6   < > 5.7* 4.1 3.8  CL 97*   < > 97* 92* 95*  CO2 22   < > 18* 23 27  BUN 59*   < > 38* 61* 30*  CREATININE  10.54*   < > 7.80* 10.17* 5.77*  CALCIUM 9.4   < > 9.0 9.0 9.1  PROT 7.8  --   --   --   --   BILITOT 0.6  --   --   --   --   ALKPHOS 152*  --   --   --   --   ALT 24  --   --   --   --   AST 34  --   --   --   --   GLUCOSE 126*   < > 102* 127* 109*   < > = values in this interval not  displayed.     Imaging/Diagnostic Tests: No results found.   Meccariello, Bernita Raisin, DO 09/12/2018, 7:14 AM PGY-1, [place] Intern pager: [phone], text pages welcome

## 2018-09-13 LAB — CBC
HCT: 28.5 % — ABNORMAL LOW (ref 36.0–46.0)
Hemoglobin: 9.1 g/dL — ABNORMAL LOW (ref 12.0–15.0)
MCH: 26.4 pg (ref 26.0–34.0)
MCHC: 31.9 g/dL (ref 30.0–36.0)
MCV: 82.6 fL (ref 80.0–100.0)
Platelets: 199 10*3/uL (ref 150–400)
RBC: 3.45 MIL/uL — ABNORMAL LOW (ref 3.87–5.11)
RDW: 18.7 % — ABNORMAL HIGH (ref 11.5–15.5)
WBC: 7.8 10*3/uL (ref 4.0–10.5)
nRBC: 0.3 % — ABNORMAL HIGH (ref 0.0–0.2)

## 2018-09-13 LAB — RENAL FUNCTION PANEL
Albumin: 2.9 g/dL — ABNORMAL LOW (ref 3.5–5.0)
Anion gap: 19 — ABNORMAL HIGH (ref 5–15)
BUN: 59 mg/dL — ABNORMAL HIGH (ref 8–23)
CO2: 24 mmol/L (ref 22–32)
Calcium: 9.5 mg/dL (ref 8.9–10.3)
Chloride: 94 mmol/L — ABNORMAL LOW (ref 98–111)
Creatinine, Ser: 9.12 mg/dL — ABNORMAL HIGH (ref 0.44–1.00)
GFR calc Af Amer: 4 mL/min — ABNORMAL LOW (ref >60)
GFR calc non Af Amer: 4 mL/min — ABNORMAL LOW (ref >60)
Glucose, Bld: 114 mg/dL — ABNORMAL HIGH (ref 70–99)
Phosphorus: 2.8 mg/dL (ref 2.5–4.6)
Potassium: 4.2 mmol/L (ref 3.5–5.1)
Sodium: 137 mmol/L (ref 135–145)

## 2018-09-13 MED ORDER — CEFDINIR 300 MG PO CAPS: 300.0000 mg | ORAL_CAPSULE | Freq: Every evening | ORAL | 0 refills | Status: AC

## 2018-09-13 MED ORDER — METOPROLOL TARTRATE 50 MG PO TABS: 50.0000 mg | ORAL_TABLET | Freq: Two times a day (BID) | ORAL | 0 refills | Status: AC

## 2018-09-13 MED ORDER — HYDRALAZINE HCL 50 MG PO TABS: 50.0000 mg | ORAL_TABLET | Freq: Three times a day (TID) | ORAL | 0 refills | Status: DC

## 2018-09-13 MED ORDER — SEVELAMER CARBONATE 800 MG PO TABS: 2400.0000 mg | ORAL_TABLET | Freq: Three times a day (TID) | ORAL | 0 refills | Status: AC

## 2018-09-13 MED ORDER — DOXYCYCLINE HYCLATE 100 MG PO TABS: 100.0000 mg | ORAL_TABLET | Freq: Two times a day (BID) | ORAL | 0 refills | Status: AC

## 2018-09-13 NOTE — Progress Notes (Signed)
Family Medicine Teaching Service Daily Progress Note Intern Pager: [phone]  Patient name: Alexa Dougherty record number: [id number] Date of birth: [birth year] Age: 73 y.o. Gender: female  Primary Care Provider: Bartholome Bill, MD Consultants: Nephrology, curbside ID, CCM Code Status: Full   Pt Overview and Major Events to Date:  Admitted to [facility] on 4/20  Assessment and Plan: Aoki Wedemeyer is a 73 y.o. female presenting with hypertensive emergency. 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  Acute Hypoxic Respiratory Failure: Improving Likely 2/2 fluid overload vs multifocal pneumonia seen on CTA 4/20.  Transitioned to PO Abx on 2/23.  Remains with temp WNL, WBC continues to be WNL at 7.8.  Was weaned to RA yesterday, but desat noted while walking with PT.  Overnight was on 2L and then weaned to 1L.  Patient would benefit from continued O2 therapy at home that should be weaned as tolerated.  Plan for HD today, as scheduled.  Patient currently on RA and breathing comfortably.  Continues to endorse improved resp status. -CCM consulted, appreciate recommendations   -Monitor temperature curve - monitor WBC -Tylenol PRN - s/p vanc and cefepime (4/20-4/22) - cont PO cefdinir and doxycyline (4/23-26), will plan for 7 days total treatment -PT/OT consult: HHPT/OT, orders in - f/u blood cx: NG4D - ambulate with pulse ox today  Hypertensive emergency with Pulmonary Edema: Improving BP improved following HD on 4/22.  BP over last 24 hrs 112-154/57-70.  BP this AM 154/66.  Patient is due for HD today.  Given that she has had hypotensive Bps and BP seems to increase when HD is needed, would not add more  antihypertensive treatment, as HD will likely be therapeutic and patient at fall risk with hypotension. -nephrology consulted, appreciate recommendations  -continue home norvasc/metoprolol  -continue hydralazine 50 mg TID -monitor closely   ESRD with HD MWF.  Renal consulted in ED. -Recommendations per nephrology  Chronic Normocytic Anemia  Basline per chart review about 10-11.  Likely 2/2 ESRD.  Stable at 9.1. - monitor CBC  GERD -Protonix 40 mg   Hyperlipidemia Lipid Panel with LDL 66, no need to adjust atorvastatin at this time.  -Continue home atorvastatin  Language barrier Speaks Laotian -will need interpretor for encounters   FEN/GI: Renal Diet Prophylaxis: SQ Heparin  Disposition: likely home today after HD  Subjective:  Patient denies complaints at present.  States that she feels good.  Wants to go home.  Objective: Temp:  [97.4 F (36.3 C)-99.1 F (37.3 C)] 99.1 F (37.3 C) (04/24 0405) Pulse Rate:  [71-85] 74 (04/24 0405) Resp:  [14-24] 24 (04/24 0405) BP: (112-154)/(57-70) 154/66 (04/24 0405) SpO2:  [86 %-99 %] 97 % (04/24 0405)  Physical Exam: General: 73 y.o. female in NAD, in HD Cardio: RRR no m/r/g Lungs: CTAB, no wheezing, no rhonchi, no crackles, no IWOB on RA Abdomen: Soft, non-tender to palpation, positive bowel sounds Skin: warm and dry Extremities: No edema  Laboratory: Recent Labs  Lab 09/11/18 0546 09/12/18 0448 09/13/18 0435  WBC 13.3* 8.3 7.8  HGB 9.2* 9.0* 9.1*  HCT 28.7* 27.5* 28.5*  PLT 186 192 199   Recent Labs  Lab 09/08/18 2350  09/11/18 0546 09/12/18 0448 09/13/18 0435  NA 133*   < > 134* 137 137  K 4.6   < >  4.1 3.8 4.2  CL 97*   < > 92* 95* 94*  CO2 22   < > 23 27 24   BUN 59*   < > 61* 30* 59*  CREATININE 10.54*   < > 10.17* 5.77* 9.12*  CALCIUM 9.4   < > 9.0 9.1 9.5  PROT 7.8  --   --   --   --   BILITOT 0.6  --   --   --   --   ALKPHOS 152*  --   --   --   --   ALT 24  --   --   --   --   AST 34   --   --   --   --   GLUCOSE 126*   < > 127* 109* 114*   < > = values in this interval not displayed.     Imaging/Diagnostic Tests: No results found.   Meccariello, Bernita Raisin, DO 09/13/2018, 7:32 AM PGY-1, [place] Intern pager: [phone], text pages welcome

## 2018-09-13 NOTE — Procedures (Signed)
I was present at this dialysis session. I have reviewed the session itself and made appropriate changes.   Vital signs in last 24 hours:  Temp:  [97.4 F (36.3 C)-99.1 F (37.3 C)] 99.1 F (37.3 C) (04/24 0405) Pulse Rate:  [71-85] 74 (04/24 0405) Resp:  [14-24] 24 (04/24 0405) BP: (112-154)/(57-70) 154/66 (04/24 0405) SpO2:  [86 %-99 %] 97 % (04/24 0405) Weight change:  Filed Weights   09/09/18 1032 09/11/18 0700 09/11/18 1105  Weight: 76 kg 74.6 kg 71.6 kg    Recent Labs  Lab 09/13/18 0435  NA 137  K 4.2  CL 94*  CO2 24  GLUCOSE 114*  BUN 59*  CREATININE 9.12*  CALCIUM 9.5  PHOS 2.8    Recent Labs  Lab 09/08/18 2350  09/11/18 0546 09/12/18 0448 09/13/18 0435  WBC 12.7*   < > 13.3* 8.3 7.8  NEUTROABS 8.8*  --  10.2*  --   --   HGB 11.0*   < > 9.2* 9.0* 9.1*  HCT 37.5   < > 28.7* 27.5* 28.5*  MCV 87.0   < > 82.5 83.3 82.6  PLT 219   < > 186 192 199   < > = values in this interval not displayed.    Scheduled Meds: . amLODipine  10 mg Oral Q supper  . atorvastatin  10 mg Oral QPM  . cefdinir  300 mg Oral QPM  . Chlorhexidine Gluconate Cloth  6 each Topical Q0600  . cinacalcet  30 mg Oral QHS  . doxycycline  100 mg Oral Q12H  . heparin  5,000 Units Subcutaneous Q8H  . hydrALAZINE  50 mg Oral Q8H  . metoprolol tartrate  50 mg Oral BID  . multivitamin  1 tablet Oral QHS  . pantoprazole  40 mg Oral Daily  . sevelamer carbonate  2,400 mg Oral TID WC   Continuous Infusions: PRN Meds:.acetaminophen **OR** acetaminophen, acetaminophen    [facility] MWF 3h 60min 400/ 1.5 73kg 2/2.25 bath Heparin none LUA AVF - mircera 75 ug every 2 wks, last ?  Assessment/ Plan:  1. Acute respiratory distress- covid-19 negative x2and bilateral lung infiltrates. Improving with HD and UF.She is now 2.4 kg below edw and markedly improved. continue to challenge edw.  2. Hypertensive emergency- receiving IV ntg and hydralazine.  1. Markedly improved with UF on  HD.  3. ESRD- continue with HD q MWF 4. Anemia:stable 5. CKD-MBD:stable 6. Nutrition:renal diet 7. Hypertension:as above 8. Hyperkalemia-improved with HD,lokelma,and renal diet.  Will stop lokelma and follow with HD. 9. Leukocytosis- r/oinfectious etiology.   Donetta Potts,  MD 09/13/2018, 8:16 AM

## 2018-09-13 NOTE — Progress Notes (Signed)
Physical Therapy Treatment Patient Details Name: Alexa Dougherty MRN: [id number] DOB: 1946/02/06 Today's Date: 09/13/2018    History of Present Illness Pt is a 73 y.o. female admitted 09/08/18 with acute respiratory distress and hypertensive urgency. Covid neg x 2. PMH includes ESRD on HD, HTN.   PT Comments    Pt preparing for discharge home this afternoon. Performing mobility with RW and ADLs throughout room at supervision-level. Pt requesting use of supplemental O2 during session despite education on not needing it now or for home use. Pt will have necessary assist from family upon return home. Has no further questions or concerns. If to remain admitted, will follow acutely.    Follow Up Recommendations  Home health PT;Supervision for mobility/OOB     Equipment Recommendations  None recommended by PT    Recommendations for Other Services       Precautions / Restrictions Precautions Precautions: Fall Restrictions Weight Bearing Restrictions: No    Mobility  Bed Mobility Overal bed mobility: Modified Independent Bed Mobility: Supine to Sit;Sit to Supine     Supine to sit: Modified independent (Device/Increase time) Sit to supine: Modified independent (Device/Increase time)      Transfers Overall transfer level: Needs assistance Equipment used: None;Rolling walker (2 wheeled) Transfers: Sit to/from Stand Sit to Stand: Supervision         General transfer comment: Performed multiple sit<>stands from bed to don pants/underwear without DME, and stood from bed and toilet to RW; supervision for safety, no physical assist required  Ambulation/Gait Ambulation/Gait assistance: Supervision Gait Distance (Feet): 50 Feet Assistive device: Rolling walker (2 wheeled) Gait Pattern/deviations: Step-through pattern;Decreased stride length;Trunk flexed Gait velocity: Decreased Gait velocity interpretation: <1.8 ft/sec, indicate of risk for recurrent falls General Gait Details:  Slow, steady gait in room with RW and supervision for safety   Stairs             Wheelchair Mobility    Modified Rankin (Stroke Patients Only)       Balance Overall balance assessment: Needs assistance Sitting-balance support: No upper extremity supported;Feet supported Sitting balance-Leahy Scale: Good     Standing balance support: Single extremity supported Standing balance-Leahy Scale: Fair Standing balance comment: Does not require UE support for dynamic standing tasks; min guard for balance                            Cognition Arousal/Alertness: Awake/alert Behavior During Therapy: WFL for tasks assessed/performed Overall Cognitive Status: Within Functional Limits for tasks assessed                                 General Comments: Follows simple verbal and gestural commands well       Exercises      General Comments        Pertinent Vitals/Pain Pain Assessment: No/denies pain    Home Living                      Prior Function            PT Goals (current goals can now be found in the care plan section) Acute Rehab PT Goals Patient Stated Goal: family wants pt to return home PT Goal Formulation: With patient/family Time For Goal Achievement: 09/23/18 Potential to Achieve Goals: Good Progress towards PT goals: Progressing toward goals    Frequency    Min 3X/week  PT Plan Current plan remains appropriate    Co-evaluation              AM-PAC PT "6 Clicks" Mobility   Outcome Measure  Help needed turning from your back to your side while in a flat bed without using bedrails?: None Help needed moving from lying on your back to sitting on the side of a flat bed without using bedrails?: None Help needed moving to and from a bed to a chair (including a wheelchair)?: None Help needed standing up from a chair using your arms (e.g., wheelchair or bedside chair)?: A Little Help needed to walk in  hospital room?: A Little Help needed climbing 3-5 steps with a railing? : A Little 6 Click Score: 21    End of Session   Activity Tolerance: Patient limited by fatigue Patient left: in bed;with call bell/phone within reach Nurse Communication: Mobility status PT Visit Diagnosis: Unsteadiness on feet (R26.81);Other abnormalities of gait and mobility (R26.89);Muscle weakness (generalized) (M62.81)     Time: 8850-2774 PT Time Calculation (min) (ACUTE ONLY): 16 min  Charges:  $Therapeutic Activity: 8-22 mins                    Mabeline Caras, PT, DPT Acute Rehabilitation Services  Pager [phone] Office [place] 09/13/2018, 5:14 PM

## 2018-09-13 NOTE — TOC Transition Note (Addendum)
Transition of Care [facility]) - CM/SW Discharge Note   Patient Details  Name: Alexa Dougherty MRN: [id number] Date of Birth: 16-Dec-1945  Transition of Care [facility]) CM/SW Contact:  Zenon Mayo, RN Phone Number: 09/13/2018, 3:35 PM   Clinical Narrative:    From home with relatives, for dc today, does not qualify for home oxygen, will need HHRN, PT, OT, and aide, NCM spoke with daughter who speaks [place], she chose [facility]. Referral given to [facility]. She will check to see if they can take referral. Received call from Butch Penny , she states they can take referral. Soc will begin 24-48 hrs post dc.   Final next level of care: [place] Barriers to Discharge: No Barriers Identified   Patient Goals and CMS Choice Patient states their goals for this hospitalization and ongoing recovery are:: go home CMS Medicare.gov Compare Post Acute Care list provided to:: Patient Represenative (must comment)(daughter) Choice offered to / list presented to : Adult Children  Discharge Placement                       Discharge Plan and Services In-house Referral: NA Discharge Planning Services: CM Consult Post Acute Care Choice: Home Health          DME Arranged: N/A DME Agency: NA       HH Arranged: RN, PT, OT, Nurse's Aide [place] Agency: [place] ([facility]) Date HH Agency Contacted: 09/13/18 Time [place]: 1522 Representative spoke with at [place]: [place] ([place]) Interventions     Readmission Risk Interventions No flowsheet data found.

## 2018-09-13 NOTE — Progress Notes (Signed)
SATURATION QUALIFICATIONS: (This note is used to comply with regulatory documentation for home oxygen)  Patient Saturations on Room Air at Rest = 94%  Patient Saturations on Room Air while Ambulating = 92-97%  Patient Saturations on 0 Liters of oxygen while Ambulating = 92%  Please briefly explain why patient needs home oxygen: Patient ambulated to nurses station and back to her room on room air and sats remained above 92%

## 2018-09-13 NOTE — Progress Notes (Signed)
PT Cancellation Note  Patient Details Name: Alexa Dougherty MRN: [id number] DOB: 10-Dec-1945   Cancelled Treatment:    Reason Eval/Treat Not Completed: Patient at procedure or test/unavailable (HD). Will follow-up for PT treatment as schedule permits.  Mabeline Caras, PT, DPT Acute Rehabilitation Services  Pager [phone] Office [place] 09/13/2018, 7:40 AM

## 2018-09-13 NOTE — Progress Notes (Addendum)
Occupational Therapy Treatment Patient Details Name: Alexa Dougherty MRN: [id number] DOB: 02/01/1946 Today's Date: 09/13/2018    History of present illness Pt adm with acute respiratory distress and hypertensive urgency. Covid neg x 2. PMH - ESRD on HD, HTN   OT comments  Pt tolerating session well as she had just come back from hemodialysis and was fatigued. Pt minguardA for bed mobility and sit to stand with minguardA for stability as pt was lethargic. Pt performing ADL functional mobility in room with RW with minguardA and no stability required today. Pt washing hands at sink and denied need to use bathroom. Pt shaking head no to deny need for additional grooming/ADL tasks in standing. Pt progressing with >94% O2 on RA with HR increasing to 120 BPM. Pt would benefit from continued OT skilled services for ADL, mobility and safety in [place] setting. OT to follow acutely.      Follow Up Recommendations  Home health OT;Supervision/Assistance - 24 hour    Equipment Recommendations  None recommended by OT    Recommendations for Other Services      Precautions / Restrictions Precautions Precautions: Fall Precaution Comments: COVID (-) twice Restrictions Weight Bearing Restrictions: No       Mobility Bed Mobility Overal bed mobility: Needs Assistance Bed Mobility: Supine to Sit;Sit to Supine     Supine to sit: Min guard;HOB elevated Sit to supine: Min guard   General bed mobility comments: minguardA for trunk elevation  Transfers Overall transfer level: Needs assistance Equipment used: Rolling walker (2 wheeled) Transfers: Sit to/from Stand Sit to Stand: Supervision         General transfer comment: Assist for balance and safety    Balance Overall balance assessment: Needs assistance Sitting-balance support: No upper extremity supported;Feet supported Sitting balance-Leahy Scale: Fair     Standing balance support: Single extremity supported Standing balance-Leahy  Scale: Fair Standing balance comment: at least 1 arm supported standing at sink                           ADL either performed or assessed with clinical judgement   ADL Overall ADL's : Needs assistance/impaired     Grooming: Set up;Wash/dry hands;Wash/dry face;Oral care;Standing                               Functional mobility during ADLs: Min guard;Rolling walker General ADL Comments: >93% on RA so Pt requires O2 skilled services. Pt performing grooming with increased independence  standing at sink with decreased assist for stability at sink even as pt was fatigued from HD.     Vision   Vision Assessment?: No apparent visual deficits   Perception     Praxis      Cognition Arousal/Alertness: Awake/alert Behavior During Therapy: WFL for tasks assessed/performed Overall Cognitive Status: Within Functional Limits for tasks assessed                                 General Comments: follows commands and initates ADLs [facility]        Exercises     Shoulder Instructions       General Comments HR increased to 120 BPM with activity; O2 at rest on RA >94%.    Pertinent Vitals/ Pain       Pain Assessment: No/denies pain  Home Living  Prior Functioning/Environment              Frequency  Min 3X/week        Progress Toward Goals  OT Goals(current goals can now be found in the care plan section)  Progress towards OT goals: Progressing toward goals  Acute Rehab OT Goals Patient Stated Goal: family wants pt to return home OT Goal Formulation: With patient Time For Goal Achievement: 09/24/18 Potential to Achieve Goals: Good ADL Goals Pt Will Perform Grooming: with modified independence;standing Pt Will Perform Lower Body Dressing: with modified independence;sit to/from stand Pt Will Transfer to Toilet: with modified independence;ambulating;regular height  toilet Additional ADL Goal #1: Pt will demonstrate increased activity tolerance to perform three ADLs in standing with supervision Additional ADL Goal #2: Pt and family will verbalize three energy conseravtion techniques for ADLs  Plan Discharge plan remains appropriate    Co-evaluation                 AM-PAC OT "6 Clicks" Daily Activity     Outcome Measure   Help from another person eating meals?: Total Help from another person taking care of personal grooming?: A Little Help from another person toileting, which includes using toliet, bedpan, or urinal?: A Little Help from another person bathing (including washing, rinsing, drying)?: A Little Help from another person to put on and taking off regular upper body clothing?: A Little Help from another person to put on and taking off regular lower body clothing?: A Little 6 Click Score: 16    End of Session Equipment Utilized During Treatment: Rolling walker  OT Visit Diagnosis: Unsteadiness on feet (R26.81);Other abnormalities of gait and mobility (R26.89);Muscle weakness (generalized) (M62.81)   Activity Tolerance Patient tolerated treatment well   Patient Left in chair;with call bell/phone within reach;with chair alarm set   Nurse Communication Mobility status        Time: 2025-4270 OT Time Calculation (min): 25 min  Charges: OT General Charges $OT Visit: 1 Visit OT Treatments $Self Care/Home Management : 8-22 mins $Neuromuscular Re-education: 8-22 mins  Darryl Nestle) Marsa Aris OTR/L Acute Rehabilitation Services Pager: [phone] Office: [phone]   Audie Pinto 09/13/2018, 12:36 PM

## 2018-09-14 LAB — CULTURE, BLOOD (ROUTINE X 2)
Culture: NO GROWTH
Culture: NO GROWTH

## 2020-01-21 ENCOUNTER — Encounter: Payer: Self-pay | Admitting: Internal Medicine

## 2020-03-03 ENCOUNTER — Encounter: Payer: Self-pay | Admitting: Internal Medicine

## 2020-03-08 ENCOUNTER — Telehealth: Payer: Self-pay | Admitting: Internal Medicine

## 2020-03-08 NOTE — Telephone Encounter (Signed)
Spoke with [name]- daughter - she wanted to know if they have to use the interpreter service- I explained her no, she can interpret but has to sign the interpreter release form- I sch PV for 10 am 10-19 , 60 minutes  and Colon 11/11 8 am Carlean Purl

## 2020-03-08 NOTE — Telephone Encounter (Signed)
Pt's daughter is requesting a call back from a nurse to discuss the pre visit the pt was scheduled for 10/19 @11 :00am, I tried to explain to her the pt needs a 60 min appt but caller insists on speaking and keeping that appt since the pt understands and has done this procedure before.

## 2020-03-09 ENCOUNTER — Ambulatory Visit (AMBULATORY_SURGERY_CENTER): Payer: Self-pay

## 2020-03-09 ENCOUNTER — Other Ambulatory Visit: Payer: Self-pay

## 2020-03-09 VITALS — Ht 60.0 in | Wt 160.4 lb

## 2020-03-09 DIAGNOSIS — Z86010 Personal history of colonic polyps: Secondary | ICD-10-CM

## 2020-03-09 MED ORDER — NA SULFATE-K SULFATE-MG SULF 17.5-3.13-1.6 GM/177ML PO SOLN: 1.0000 | Freq: Once | ORAL | 0 refills | Status: AC

## 2020-03-09 NOTE — Progress Notes (Signed)
No allergies to soy or egg Pt is not on blood thinners or diet pills Denies issues with sedation/intubation Denies atrial flutter/fib Denies constipation   [place] daughter who will translate for mom.  Pt is aware of Covid safety and care partner requirements.

## 2020-04-01 ENCOUNTER — Telehealth: Payer: Self-pay | Admitting: Internal Medicine

## 2020-04-01 ENCOUNTER — Ambulatory Visit (AMBULATORY_SURGERY_CENTER): Payer: Medicare Other | Admitting: Internal Medicine

## 2020-04-01 ENCOUNTER — Encounter: Payer: Medicare Other | Admitting: Internal Medicine

## 2020-04-01 ENCOUNTER — Other Ambulatory Visit: Payer: Self-pay

## 2020-04-01 ENCOUNTER — Encounter: Payer: Self-pay | Admitting: Internal Medicine

## 2020-04-01 VITALS — BP 188/88 | HR 65 | Temp 97.3°F | Resp 22 | Ht 60.0 in | Wt 160.0 lb

## 2020-04-01 DIAGNOSIS — Z860100 Personal history of colon polyps, unspecified: Secondary | ICD-10-CM

## 2020-04-01 DIAGNOSIS — D122 Benign neoplasm of ascending colon: Secondary | ICD-10-CM

## 2020-04-01 DIAGNOSIS — D124 Benign neoplasm of descending colon: Secondary | ICD-10-CM

## 2020-04-01 DIAGNOSIS — D125 Benign neoplasm of sigmoid colon: Secondary | ICD-10-CM | POA: Diagnosis not present

## 2020-04-01 MED ORDER — SODIUM CHLORIDE 0.9 % IV SOLN: 500.0000 mL | Freq: Once | INTRAVENOUS | Status: DC

## 2020-04-01 NOTE — Progress Notes (Signed)
Called to room to assist during endoscopic procedure.  Patient ID and intended procedure confirmed with present staff. Received instructions for my participation in the procedure from the performing physician.  

## 2020-04-01 NOTE — Patient Instructions (Addendum)
8 small polyps are removed today. No signs of cancer. Resume previous diet  continue current medications  You also have a condition called diverticulosis - common and not usually a problem. Please read the handout provided.  I will let you know pathology results and when or if to have another routine colonoscopy by mail and/or My Chart. YOU HAD AN ENDOSCOPIC PROCEDURE TODAY AT [facility]:   Refer to the procedure report that was given to you for any specific questions about what was found during the examination.  If the procedure report does not answer your questions, please call your gastroenterologist to clarify.  If you requested that your care partner not be given the details of your procedure findings, then the procedure report has been included in a sealed envelope for you to review at your convenience later.  YOU SHOULD EXPECT: Some feelings of bloating in the abdomen. Passage of more gas than usual.  Walking can help get rid of the air that was put into your GI tract during the procedure and reduce the bloating. If you had a lower endoscopy (such as a colonoscopy or flexible sigmoidoscopy) you may notice spotting of blood in your stool or on the toilet paper. If you underwent a bowel prep for your procedure, you may not have a normal bowel movement for a few days.  Please Note:  You might notice some irritation and congestion in your nose or some drainage.  This is from the oxygen used during your procedure.  There is no need for concern and it should clear up in a day or so.  SYMPTOMS TO REPORT IMMEDIATELY:   Following lower endoscopy (colonoscopy or flexible sigmoidoscopy):  Excessive amounts of blood in the stool  Significant tenderness or worsening of abdominal pains  Swelling of the abdomen that is new, acute  Fever of 100F or higher  For urgent or emergent issues, a gastroenterologist can be reached at any hour by calling [phone]. Do not use MyChart  messaging for urgent concerns.    DIET:  We do recommend a small meal at first, but then you may proceed to your regular diet.  Drink plenty of fluids but you should avoid alcoholic beverages for 24 hours.  ACTIVITY:  You should plan to take it easy for the rest of today and you should NOT DRIVE or use heavy machinery until tomorrow (because of the sedation medicines used during the test).    FOLLOW UP: Our staff will call the number listed on your records 48-72 hours following your procedure to check on you and address any questions or concerns that you may have regarding the information given to you following your procedure. If we do not reach you, we will leave a message.  We will attempt to reach you two times.  During this call, we will ask if you have developed any symptoms of COVID 19. If you develop any symptoms (ie: fever, flu-like symptoms, shortness of breath, cough etc.) before then, please call [phone].  If you test positive for Covid 19 in the 2 weeks post procedure, please call and report this information to [place].    If any biopsies were taken you will be contacted by phone or by letter within the next 1-3 weeks.  Please call us at [phone] if you have not heard about the biopsies in 3 weeks.    SIGNATURES/CONFIDENTIALITY: You and/or your care partner have signed paperwork which will be entered into your electronic medical  record.  These signatures attest to the fact that that the information above on your After Visit Summary has been reviewed and is understood.  Full responsibility of the confidentiality of this discharge information lies with you and/or your care-partner. 

## 2020-04-01 NOTE — Op Note (Signed)
[name] Procedure Date: 04/01/2020 8:06 AM MRN: [id number] Endoscopist: Gatha Mayer , MD Age: 74 Referring MD:  Date of Birth: Apr 16, 1946 Gender: Female Account #: [id number] Procedure:                Colonoscopy Indications:              Surveillance: Personal history of adenomatous                            polyps on last colonoscopy > 5 years ago Medicines:                Propofol per Anesthesia, Monitored Anesthesia Care Procedure:                Pre-Anesthesia Assessment:                           - Prior to the procedure, a History and Physical                            was performed, and patient medications and                            allergies were reviewed. The patient's tolerance of                            previous anesthesia was also reviewed. The risks                            and benefits of the procedure and the sedation                            options and risks were discussed with the patient.                            All questions were answered, and informed consent                            was obtained. Prior Anticoagulants: The patient has                            taken no previous anticoagulant or antiplatelet                            agents. ASA Grade Assessment: III - A patient with                            severe systemic disease. After reviewing the risks                            and benefits, the patient was deemed in                            satisfactory condition to undergo the procedure.  After obtaining informed consent, the colonoscope                            was passed under direct vision. Throughout the                            procedure, the patient's blood pressure, pulse, and                            oxygen saturations were monitored continuously. The                            Colonoscope was introduced through the anus and                             advanced to the the cecum, identified by                            appendiceal orifice and ileocecal valve. The                            colonoscopy was performed without difficulty. The                            patient tolerated the procedure well. The quality                            of the bowel preparation was good. The ileocecal                            valve, appendiceal orifice, and rectum were                            photographed. The bowel preparation used was                            Miralax via split dose instruction. Scope In: 8:13:40 AM Scope Out: 8:34:22 AM Scope Withdrawal Time: 0 hours 13 minutes 9 seconds  Total Procedure Duration: 0 hours 20 minutes 42 seconds  Findings:                 The perianal and digital rectal examinations were                            normal.                           Eight sessile polyps were found in the sigmoid                            colon, descending colon and ascending colon. The                            polyps were 2 to 6 mm in size. These polyps were  removed with a cold snare. Resection and retrieval                            were complete. Verification of patient                            identification for the specimen was done. Estimated                            blood loss was minimal.                           Many small and large-mouthed diverticula were found                            in the entire colon.                           The exam was otherwise without abnormality on                            direct and retroflexion views. Complications:            No immediate complications. Estimated Blood Loss:     Estimated blood loss was minimal. Impression:               - Eight 2 to 6 mm polyps in the sigmoid colon, in                            the descending colon and in the ascending colon,                            removed with a cold snare. Resected and retrieved.                            - Diverticulosis in the entire examined colon.                           - The examination was otherwise normal on direct                            and retroflexion views.                           - Personal history of colonic polyps. Recommendation:           - Patient has a contact number available for                            emergencies. The signs and symptoms of potential                            delayed complications were discussed with the  patient. Return to normal activities tomorrow.                            Written discharge instructions were provided to the                            patient.                           - Resume previous diet.                           - Continue present medications.                           - No recommendation at this time regarding repeat                            colonoscopy due to age. Gatha Mayer, MD 04/01/2020 8:50:39 AM This report has been signed electronically.

## 2020-04-01 NOTE — Progress Notes (Signed)
Pt's states no medical or surgical changes since previsit or office visit.  WR - vitals 

## 2020-04-01 NOTE — Progress Notes (Signed)
Lidocaine 2% 1ml IV given as per Dr. Gessner. 

## 2020-04-01 NOTE — Telephone Encounter (Signed)
Dr Carlean Purl will contact patient/daughter.

## 2020-04-01 NOTE — Telephone Encounter (Signed)
Returned pts call and spoke to daughter.  She says that her mother has had some bleeding with her BMs since being home.  She has moved her bowels 3 times and states that there is blood in the toilet mixed in with liquid stool and also some on the tissue.  She says that the last time she went it was less than the first time.  She is having no other symptoms at this time.  Dr. Carlean Purl please advise.

## 2020-04-01 NOTE — Telephone Encounter (Signed)
Spoke to daughter Hx same - less bleeding   I am not surprised some bleeding after multiple polyps cold snare  Advised what to do if worsens and is voluminous - go to ED  If other ? Call back  Anticipate this will stop by tomorrow

## 2020-04-01 NOTE — Telephone Encounter (Signed)
Spoke with patient's daughter.  Patient reports 3 watery BMs that contained blood.  No other accompanying symptoms.  3rd episode less bleeding.  Patient was unable to quantify for daughter.  Will contact Dr Carlean Purl for further guidance.

## 2020-04-01 NOTE — Progress Notes (Signed)
pt tolerated well. VSS. awake and to recovery. Report given to RN.  

## 2020-04-05 ENCOUNTER — Telehealth: Payer: Self-pay

## 2020-04-05 NOTE — Telephone Encounter (Signed)
Left message on answering machine. 

## 2020-04-05 NOTE — Telephone Encounter (Signed)
  Follow up Call-  Call back number 04/01/2020  Post procedure Call Back phone  # [phone]  Permission to leave phone message Yes  Some recent data might be hidden     Patient questions:  Do you have a fever, pain , or abdominal swelling? No. Pain Score  0 *  Have you tolerated food without any problems? Yes.    Have you been able to return to your normal activities? Yes.    Do you have any questions about your discharge instructions: Diet   No. Medications  No. Follow up visit  No.  Do you have questions or concerns about your Care? No.  Actions: * If pain score is 4 or above: No action needed, pain <4.  1. Have you developed a fever since your procedure? no  2.   Have you had an respiratory symptoms (SOB or cough) since your procedure? no  3.   Have you tested positive for COVID 19 since your procedure no  4.   Have you had any family members/close contacts diagnosed with the COVID 19 since your procedure?  no   If yes to any of these questions please route to Joylene John, RN and Joella Prince, RN

## 2020-04-14 ENCOUNTER — Encounter: Payer: Self-pay | Admitting: Internal Medicine

## 2020-04-14 DIAGNOSIS — Z86010 Personal history of colonic polyps: Secondary | ICD-10-CM

## 2020-06-14 ENCOUNTER — Encounter (HOSPITAL_COMMUNITY): Payer: Self-pay | Admitting: Emergency Medicine

## 2020-06-14 ENCOUNTER — Emergency Department (HOSPITAL_COMMUNITY): Payer: Medicare Other

## 2020-06-14 ENCOUNTER — Emergency Department (HOSPITAL_COMMUNITY)
Admission: EM | Admit: 2020-06-14 | Discharge: 2020-06-14 | Disposition: A | Discharge status: Home / Self Care | Payer: Medicare Other | Attending: Emergency Medicine | Admitting: Emergency Medicine

## 2020-06-14 ENCOUNTER — Other Ambulatory Visit: Payer: Self-pay

## 2020-06-14 DIAGNOSIS — J810 Acute pulmonary edema: Secondary | ICD-10-CM | POA: Diagnosis not present

## 2020-06-14 DIAGNOSIS — R0602 Shortness of breath: Secondary | ICD-10-CM | POA: Diagnosis present

## 2020-06-14 DIAGNOSIS — E8770 Fluid overload, unspecified: Secondary | ICD-10-CM | POA: Insufficient documentation

## 2020-06-14 DIAGNOSIS — Z992 Dependence on renal dialysis: Secondary | ICD-10-CM | POA: Insufficient documentation

## 2020-06-14 DIAGNOSIS — J9601 Acute respiratory failure with hypoxia: Secondary | ICD-10-CM | POA: Diagnosis not present

## 2020-06-14 DIAGNOSIS — Z20822 Contact with and (suspected) exposure to covid-19: Secondary | ICD-10-CM | POA: Diagnosis not present

## 2020-06-14 DIAGNOSIS — I120 Hypertensive chronic kidney disease with stage 5 chronic kidney disease or end stage renal disease: Secondary | ICD-10-CM | POA: Insufficient documentation

## 2020-06-14 DIAGNOSIS — N186 End stage renal disease: Secondary | ICD-10-CM

## 2020-06-14 LAB — LACTIC ACID, PLASMA: Lactic Acid, Venous: 1.1 mmol/L (ref 0.5–1.9)

## 2020-06-14 LAB — I-STAT VENOUS BLOOD GAS, ED
Acid-Base Excess: 3 mmol/L — ABNORMAL HIGH (ref 0.0–2.0)
Bicarbonate: 27.8 mmol/L (ref 20.0–28.0)
Calcium, Ion: 1.02 mmol/L — ABNORMAL LOW (ref 1.15–1.40)
HCT: 32 % — ABNORMAL LOW (ref 36.0–46.0)
Hemoglobin: 10.9 g/dL — ABNORMAL LOW (ref 12.0–15.0)
O2 Saturation: 87 %
Potassium: 6.1 mmol/L — ABNORMAL HIGH (ref 3.5–5.1)
Sodium: 141 mmol/L (ref 135–145)
TCO2: 29 mmol/L (ref 22–32)
pCO2, Ven: 42.6 mmHg — ABNORMAL LOW (ref 44.0–60.0)
pH, Ven: 7.423 (ref 7.250–7.430)
pO2, Ven: 52 mmHg — ABNORMAL HIGH (ref 32.0–45.0)

## 2020-06-14 LAB — COMPREHENSIVE METABOLIC PANEL
ALT: 18 U/L (ref 0–44)
AST: 32 U/L (ref 15–41)
Albumin: 3.5 g/dL (ref 3.5–5.0)
Alkaline Phosphatase: 130 U/L — ABNORMAL HIGH (ref 38–126)
Anion gap: 16 — ABNORMAL HIGH (ref 5–15)
BUN: 52 mg/dL — ABNORMAL HIGH (ref 8–23)
CO2: 23 mmol/L (ref 22–32)
Calcium: 8.8 mg/dL — ABNORMAL LOW (ref 8.9–10.3)
Chloride: 100 mmol/L (ref 98–111)
Creatinine, Ser: 9.35 mg/dL — ABNORMAL HIGH (ref 0.44–1.00)
GFR, Estimated: 4 mL/min — ABNORMAL LOW (ref >60)
Glucose, Bld: 119 mg/dL — ABNORMAL HIGH (ref 70–99)
Potassium: 5.9 mmol/L — ABNORMAL HIGH (ref 3.5–5.1)
Sodium: 139 mmol/L (ref 135–145)
Total Bilirubin: 0.7 mg/dL (ref 0.3–1.2)
Total Protein: 7.3 g/dL (ref 6.5–8.1)

## 2020-06-14 LAB — TROPONIN I (HIGH SENSITIVITY)
Troponin I (High Sensitivity): 13 ng/L (ref <18)
Troponin I (High Sensitivity): 12 ng/L (ref <18)

## 2020-06-14 LAB — CBC
HCT: 35.3 % — ABNORMAL LOW (ref 36.0–46.0)
Hemoglobin: 10.6 g/dL — ABNORMAL LOW (ref 12.0–15.0)
MCH: 27.6 pg (ref 26.0–34.0)
MCHC: 30 g/dL (ref 30.0–36.0)
MCV: 91.9 fL (ref 80.0–100.0)
Platelets: 243 10*3/uL (ref 150–400)
RBC: 3.84 MIL/uL — ABNORMAL LOW (ref 3.87–5.11)
RDW: 19.1 % — ABNORMAL HIGH (ref 11.5–15.5)
WBC: 13 10*3/uL — ABNORMAL HIGH (ref 4.0–10.5)
nRBC: 0 % (ref 0.0–0.2)

## 2020-06-14 LAB — PROTIME-INR
INR: 1 (ref 0.8–1.2)
Prothrombin Time: 12.5 seconds (ref 11.4–15.2)

## 2020-06-14 LAB — BRAIN NATRIURETIC PEPTIDE: B Natriuretic Peptide: 812 pg/mL — ABNORMAL HIGH (ref 0.0–100.0)

## 2020-06-14 LAB — SARS CORONAVIRUS 2 BY RT PCR (HOSPITAL ORDER, PERFORMED IN ~~LOC~~ HOSPITAL LAB): SARS Coronavirus 2: NEGATIVE

## 2020-06-14 MED ORDER — SODIUM CHLORIDE 0.9 % IV SOLN: 100.0000 mL | INTRAVENOUS | Status: DC | PRN

## 2020-06-14 MED ORDER — PENTAFLUOROPROP-TETRAFLUOROETH EX AERO
1.0000 "application " | INHALATION_SPRAY | CUTANEOUS | Status: DC | PRN
  Filled 2020-06-14: qty 116

## 2020-06-14 MED ORDER — LIDOCAINE HCL (PF) 1 % IJ SOLN: 5.0000 mL | INTRAMUSCULAR | Status: DC | PRN

## 2020-06-14 MED ORDER — CHLORHEXIDINE GLUCONATE CLOTH 2 % EX PADS: 6.0000 | MEDICATED_PAD | Freq: Every day | CUTANEOUS | Status: DC

## 2020-06-14 MED ORDER — HEPARIN SODIUM (PORCINE) 1000 UNIT/ML DIALYSIS
1000.0000 [IU] | INTRAMUSCULAR | Status: DC | PRN
  Filled 2020-06-14: qty 1

## 2020-06-14 MED ORDER — INSULIN ASPART 100 UNIT/ML IV SOLN
5.0000 [IU] | Freq: Once | INTRAVENOUS | Status: AC
  Administered 2020-06-14: 5 [IU] via INTRAVENOUS

## 2020-06-14 MED ORDER — ALTEPLASE 2 MG IJ SOLR: 2.0000 mg | Freq: Once | INTRAMUSCULAR | Status: DC | PRN

## 2020-06-14 MED ORDER — DEXTROSE 50 % IV SOLN
1.0000 | Freq: Once | INTRAVENOUS | Status: AC
  Administered 2020-06-14: 50 mL via INTRAVENOUS
  Filled 2020-06-14: qty 50

## 2020-06-14 MED ORDER — LIDOCAINE-PRILOCAINE 2.5-2.5 % EX CREA
1.0000 "application " | TOPICAL_CREAM | CUTANEOUS | Status: DC | PRN
  Filled 2020-06-14: qty 5

## 2020-06-14 NOTE — Progress Notes (Signed)
RN stated that pt is transferring to dialysis. RT took pt off BiPAP and placed on 4LNC. Pt tolerated well.

## 2020-06-14 NOTE — ED Provider Notes (Signed)
Signout from overnight team.  75 year old with shortness of breath.  She is up in dialysis and will need reassessment when dialysis is over. Physical Exam  BP (!) 158/74   Pulse 87   Resp 16   SpO2 97%   Physical Exam  ED Course/Procedures     Procedures  MDM  1345-patient returns from dialysis.  She said she feels better and her breathing is more comfortable.  She is not requiring any oxygen.  She needs family to come and pick her up.       Hayden Rasmussen, MD 06/14/20 [phone]

## 2020-06-14 NOTE — Progress Notes (Signed)
[facility] Progress Note   Subjective:   Patient presented to the ED early this AM with acute onset SOB. Reports she was having chest pain overnight as well. No fevers, chills, dizziness, abdominal pain, nausea or vomiting. BP elevated on arrival. Pt started on BiPAP. CXR showed pulmonary edema. K+ 6.1 this AM. Her last dialysis was Friday. She completed the full treatment and left 0.3kg below her estimated dry weight. Nephrology was consulted for hemodialysis overnight.  Plan to dialyze here today then return to the ED for reevaluation and hopefully discharge home.   Objective Vitals:   06/14/20 0830 06/14/20 0845 06/14/20 0900 06/14/20 0915  BP: (!) 179/80 (!) 192/84 (!) 195/85 (!) 186/79  Pulse: 79 80 80 80  Resp:  17 18 (!) 24  SpO2: 96% 97% 99% 98%   Physical Exam General: Well developed female, alert, on BiPAP.  Heart:RRR, no murmurs, rubs or gallops Lungs: + rales bilateral lower lobes. Respirations unlabored on bipap Abdomen: Soft, non-tender, non-distended, +BS Extremities: No edema b/l lower extremities Dialysis Access:  LUE AVF + bruit  Additional Objective Labs: Basic Metabolic Panel: Recent Labs  Lab 06/14/20 0253 06/14/20 0309  NA 139 141  K 5.9* 6.1*  CL 100  --   CO2 23  --   GLUCOSE 119*  --   BUN 52*  --   CREATININE 9.35*  --   CALCIUM 8.8*  --    Liver Function Tests: Recent Labs  Lab 06/14/20 0253  AST 32  ALT 18  ALKPHOS 130*  BILITOT 0.7  PROT 7.3  ALBUMIN 3.5   CBC: Recent Labs  Lab 06/14/20 0253 06/14/20 0309  WBC 13.0*  --   HGB 10.6* 10.9*  HCT 35.3* 32.0*  MCV 91.9  --   PLT 243  --    Blood Culture    Component Value Date/Time   SDES BLOOD RIGHT HAND 09/09/2018 0136   SPECREQUEST  09/09/2018 0136    BOTTLES DRAWN AEROBIC AND ANAEROBIC Blood Culture results may not be optimal due to an inadequate volume of blood received in culture bottles   CULT  09/09/2018 0136    NO GROWTH 5 DAYS Performed at [facility], [place] [address] [place], [place] [address]    REPTSTATUS 09/14/2018 FINAL 09/09/2018 0136     Studies/Results: DG Chest Portable 1 View  Result Date: 06/14/2020 CLINICAL DATA:  Dyspnea EXAM: PORTABLE CHEST 1 VIEW COMPARISON:  09/10/2018 FINDINGS: Mild elevation of the right hemidiaphragm is unchanged. Pulmonary insufflation is stable when compared to prior examination. Moderate perihilar and right basilar airspace infiltrate is present. Superimposed mild interstitial infiltrate is present. Together, the findings suggest changes of atypical infection or moderate, slightly asymmetric pulmonary edema. Cardiac size is at the upper limits of normal, unchanged. No acute bone abnormality. IMPRESSION: Moderate asymmetric pulmonary infiltrates, atypical infection versus moderate asymmetric pulmonary edema. Electronically Signed   By: Fidela Salisbury MD   On: 06/14/2020 02:57   Medications: . sodium chloride    . sodium chloride     . Chlorhexidine Gluconate Cloth  6 each Topical Q0600  . Chlorhexidine Gluconate Cloth  6 each Topical Q0600     Assessment/Plan: 1. ESRD: presented with acute onset shortness of breath and K+ 6.1. She has been compliant with outpatient HD but left below her EDW. HD here today then can likely discharge from the ED if symptoms are resolved. 2. HTN/volume:  UFG 3-3.5L with HD today as tolerated. 3. Hyperkalemia: K+  6.1, will use 1K bath for 60 min of treatment. Continue low K diet at discharge.  4. Anemia: Hgb at goal.    Anice Paganini, PA-C 06/14/2020, 9:28 AM  [facility] Pager: [phone]

## 2020-06-14 NOTE — Discharge Instructions (Addendum)
You were seen in the emergency department for shortness of breath.  You had dialysis with improvement in your symptoms.  Please follow-up with your dialysis center.  Return to the emergency department for any worsening or concerning symptoms

## 2020-06-14 NOTE — ED Triage Notes (Signed)
Patient arrived with EMS on CPAP from home reports worsening SOB onset last night , hemodialysis q Mon/Wed/Fri . Placed on BIPAP by RT at arrival . She received 1 NTG sl by EMS prior to arrival .

## 2020-06-14 NOTE — Progress Notes (Signed)
Patient arrived via EMS on CPAP. Placed on CPAP .

## 2020-06-14 NOTE — ED Provider Notes (Addendum)
[facility] Emergency Department Provider Note MRN:  [id number]  Arrival date & time: 06/14/20     Chief Complaint   SOB/Hemodialysis   History of Present Illness   Alexa Dougherty is a 75 y.o. year-old female with a history of ESRD presenting to the ED with chief complaint of shortness of breath.  Worsening shortness of breath over the past 6 hours.  Last received dialysis 2 days ago.  No chest pain, no recent illness per report.  I was unable to obtain an accurate HPI, PMH, or ROS due to the patient's respiratory distress.  Level 5 caveat.  Review of Systems  A complete 10 system review of systems was obtained and all systems are negative except as noted in the HPI and PMH.   Patient's Health History    Past Medical History:  Diagnosis Date  . Anemia   . Arthritis    Right shoulder  . Blood transfusion without reported diagnosis    Yrs ago when she had anemia  . Cataract    bilateral repair  . Chronic kidney disease   . Colon polyps   . Dialysis patient ([place])   . Elevated cholesterol   . Gastritis and gastroduodenitis 12/29/2014  . GERD (gastroesophageal reflux disease)   . Headache(784.0)   . Hx of adenomatous colonic polyps 01/04/2015  . Hyperlipemia   . Hypertension     Past Surgical History:  Procedure Laterality Date  . AV FISTULA PLACEMENT  02/02/2012   Procedure: ARTERIOVENOUS (AV) FISTULA CREATION;  Surgeon: Angelia Mould, MD;  Location: [facility] OR;  Service: Vascular;  Laterality: Left;  . AV FISTULA REPAIR     Angioplasty of fistula multiple times Left  . COLONOSCOPY  2016  . COLONOSCOPY WITH PROPOFOL N/A 12/29/2014   Procedure: COLONOSCOPY WITH PROPOFOL;  Surgeon: Gatha Mayer, MD;  Location: [facility] ENDOSCOPY;  Service: Endoscopy;  Laterality: N/A;  . ESOPHAGOGASTRODUODENOSCOPY (EGD) WITH PROPOFOL N/A 12/29/2014   Procedure: ESOPHAGOGASTRODUODENOSCOPY (EGD) WITH PROPOFOL;  Surgeon: Gatha Mayer, MD;  Location: [facility] ENDOSCOPY;   Service: Endoscopy;  Laterality: N/A;  . FISTULOGRAM N/A 04/01/2012   Procedure: FISTULOGRAM;  Surgeon: Angelia Mould, MD;  Location: [facility] CATH LAB;  Service: Cardiovascular;  Laterality: N/A;  . INSERTION OF DIALYSIS CATHETER  02/02/2012   Procedure: INSERTION OF DIALYSIS CATHETER;  Surgeon: Angelia Mould, MD;  Location: [place];  Service: Vascular;  Laterality: Right;  Insertion of 23cm dialysis catheter in Right Internal Jugular   . TUBAL LIGATION  1980    Family History  Problem Relation Age of Onset  . Hypertension Mother   . Cancer Sister        type unknown  . Cancer Brother        type unknown  . Hypertension Daughter   . Colon cancer Neg Hx   . Colon polyps Neg Hx   . Esophageal cancer Neg Hx   . Stomach cancer Neg Hx   . Rectal cancer Neg Hx     Social History   Socioeconomic History  . Marital status: Widowed    Spouse name: Not on file  . Number of children: 7  . Years of education: Not on file  . Highest education level: Not on file  Occupational History  . Occupation: retired  Tobacco Use  . Smoking status: Never Smoker  . Smokeless tobacco: Never Used  Vaping Use  . Vaping Use: Never used  Substance and Sexual Activity  . Alcohol use: No  .  Drug use: No  . Sexual activity: Not on file  Other Topics Concern  . Not on file  Social History Narrative  . Not on file   Social Determinants of Health   Financial Resource Strain: Not on file  Food Insecurity: Not on file  Transportation Needs: Not on file  Physical Activity: Not on file  Stress: Not on file  Social Connections: Not on file  Intimate Partner Violence: Not on file     Physical Exam   Vitals:   06/14/20 0330 06/14/20 0345  BP: (!) 186/79 (!) 180/79  Pulse: 70 69  Resp: (!) 23 (!) 25  SpO2: 96% 98%    CONSTITUTIONAL: Well-appearing, NAD NEURO:  Alert and oriented x 3, no focal deficits EYES:  eyes equal and reactive ENT/NECK:  no LAD, no JVD CARDIO: Regular rate,  well-perfused, normal S1 and S2 PULM: Scattered rhonchi, tachypneic GI/GU:  normal bowel sounds, non-distended, non-tender MSK/SPINE:  No gross deformities, no edema SKIN:  no rash, atraumatic PSYCH:  Appropriate speech and behavior  *Additional and/or pertinent findings included in MDM below  Diagnostic and Interventional Summary    EKG Interpretation  Date/Time:  Monday June 14 2020 02:35:42 EST Ventricular Rate:  76 PR Interval:    QRS Duration: 99 QT Interval:  414 QTC Calculation: 466 R Axis:   85 Text Interpretation: Sinus rhythm Borderline right axis deviation Abnormal R-wave progression, late transition Confirmed by Gerlene Fee [id number]) on 06/14/2020 2:49:57 AM      Labs Reviewed  CBC - Abnormal; Notable for the following components:      Result Value   WBC 13.0 (*)    RBC 3.84 (*)    Hemoglobin 10.6 (*)    HCT 35.3 (*)    RDW 19.1 (*)    All other components within normal limits  COMPREHENSIVE METABOLIC PANEL - Abnormal; Notable for the following components:   Potassium 5.9 (*)    Glucose, Bld 119 (*)    BUN 52 (*)    Creatinine, Ser 9.35 (*)    Calcium 8.8 (*)    Alkaline Phosphatase 130 (*)    GFR, Estimated 4 (*)    Anion gap 16 (*)    All other components within normal limits  I-STAT VENOUS BLOOD GAS, ED - Abnormal; Notable for the following components:   pCO2, Ven 42.6 (*)    pO2, Ven 52.0 (*)    Acid-Base Excess 3.0 (*)    Potassium 6.1 (*)    Calcium, Ion 1.02 (*)    HCT 32.0 (*)    Hemoglobin 10.9 (*)    All other components within normal limits  SARS CORONAVIRUS 2 BY RT PCR (HOSPITAL ORDER, [facility])  PROTIME-INR  LACTIC ACID, PLASMA  BRAIN NATRIURETIC PEPTIDE  TROPONIN I (HIGH SENSITIVITY)    DG Chest Portable 1 View  Final Result      Medications  dextrose 50 % solution 50 mL (has no administration in time range)  insulin aspart (novoLOG) injection 5 Units (has no administration in time range)      Procedures  /  Critical Care .Critical Care Performed by: Maudie Flakes, MD Authorized by: Maudie Flakes, MD   Critical care provider statement:    Critical care time (minutes):  35   Critical care was necessary to treat or prevent imminent or life-threatening deterioration of the following conditions:  Respiratory failure   Critical care was time spent personally by me on the following  activities:  Discussions with consultants, evaluation of patient's response to treatment, examination of patient, ordering and performing treatments and interventions, ordering and review of laboratory studies, ordering and review of radiographic studies, pulse oximetry, re-evaluation of patient's condition, obtaining history from patient or surrogate and review of old charts Ultrasound ED Peripheral IV (Provider)  Date/Time: 06/14/2020 2:58 AM Performed by: Maudie Flakes, MD Authorized by: Maudie Flakes, MD   Procedure details:    Indications: poor IV access     Skin Prep: chlorhexidine gluconate     Location:  Right AC   Angiocath:  18 G   Bedside Ultrasound Guided: Yes     Patient tolerated procedure without complications: Yes     Dressing applied: Yes      ED Course and Medical Decision Making  I have reviewed the triage vital signs, the nursing notes, and pertinent available records from the EMR.  Listed above are laboratory and imaging tests that I personally ordered, reviewed, and interpreted and then considered in my medical decision making (see below for details).  Suspect pulmonary edema, fluid overloaded state.  Also considering COVID-19, CHF exacerbation, less likely PE.  BiPAP is being provided, awaiting chest x-ray, labs.  May need urgent dialysis.     Chest x-ray demonstrating some pulmonary edema.  Labs are overall reassuring, mild hyperkalemia but no EKG changes.  Will discuss case with nephrology to see if she can receive dialysis.  She may be able to be discharged after  dialysis, would consider bringing her back to the ED for reevaluation.  Spoke with Dr. Moshe Cipro of nephrology who is in agreement with dialysis and return to ED for reassessment.  Awaiting open machine in the dialysis unit.  Barth Kirks. Sedonia Small, MD [place] [email or web address]  Final Clinical Impressions(s) / ED Diagnoses     ICD-10-CM   1. Acute pulmonary edema (HCC)  J81.0   2. Hypervolemia, unspecified hypervolemia type  E87.70   3. ESRD (end stage renal disease) ([place])  N18.6   4. Acute respiratory failure with hypoxia (HCC)  J96.01     ED Discharge Orders    None       Discharge Instructions Discussed with and Provided to Patient:   Discharge Instructions   None       Maudie Flakes, MD 06/14/20 0400    Maudie Flakes, MD 06/14/20 [phone]

## 2020-06-14 NOTE — Procedures (Signed)
Pt seen and tolerating HD well.  Shortened HD time due to high census/ COVID surge. Well plan dc back to ED when completed.   I was present at this dialysis session, have reviewed the session itself and made  appropriate changes Kelly Splinter MD [place] pager [phone]   06/14/2020, 1:18 PM

## 2020-09-06 ENCOUNTER — Observation Stay (HOSPITAL_COMMUNITY): Payer: Medicare Other

## 2020-09-06 ENCOUNTER — Inpatient Hospital Stay (HOSPITAL_COMMUNITY)
Admission: EM | Admit: 2020-09-06 | Discharge: 2020-09-09 | DRG: 377 | Disposition: A | Discharge status: Home / Self Care | Payer: Medicare Other | Attending: Internal Medicine | Admitting: Internal Medicine

## 2020-09-06 ENCOUNTER — Encounter (HOSPITAL_COMMUNITY): Payer: Self-pay | Admitting: Physician Assistant

## 2020-09-06 ENCOUNTER — Emergency Department (HOSPITAL_COMMUNITY): Payer: Medicare Other

## 2020-09-06 ENCOUNTER — Other Ambulatory Visit: Payer: Self-pay

## 2020-09-06 DIAGNOSIS — K922 Gastrointestinal hemorrhage, unspecified: Secondary | ICD-10-CM

## 2020-09-06 DIAGNOSIS — K2951 Unspecified chronic gastritis with bleeding: Secondary | ICD-10-CM | POA: Diagnosis present

## 2020-09-06 DIAGNOSIS — D696 Thrombocytopenia, unspecified: Secondary | ICD-10-CM | POA: Diagnosis present

## 2020-09-06 DIAGNOSIS — Z86010 Personal history of colonic polyps: Secondary | ICD-10-CM

## 2020-09-06 DIAGNOSIS — Z8719 Personal history of other diseases of the digestive system: Secondary | ICD-10-CM

## 2020-09-06 DIAGNOSIS — E875 Hyperkalemia: Secondary | ICD-10-CM | POA: Diagnosis present

## 2020-09-06 DIAGNOSIS — K219 Gastro-esophageal reflux disease without esophagitis: Secondary | ICD-10-CM | POA: Diagnosis present

## 2020-09-06 DIAGNOSIS — K5731 Diverticulosis of large intestine without perforation or abscess with bleeding: Principal | ICD-10-CM | POA: Diagnosis present

## 2020-09-06 DIAGNOSIS — Z8249 Family history of ischemic heart disease and other diseases of the circulatory system: Secondary | ICD-10-CM

## 2020-09-06 DIAGNOSIS — I10 Essential (primary) hypertension: Secondary | ICD-10-CM | POA: Diagnosis present

## 2020-09-06 DIAGNOSIS — E7800 Pure hypercholesterolemia, unspecified: Secondary | ICD-10-CM | POA: Diagnosis present

## 2020-09-06 DIAGNOSIS — Z992 Dependence on renal dialysis: Secondary | ICD-10-CM

## 2020-09-06 DIAGNOSIS — E785 Hyperlipidemia, unspecified: Secondary | ICD-10-CM | POA: Diagnosis present

## 2020-09-06 DIAGNOSIS — N186 End stage renal disease: Secondary | ICD-10-CM | POA: Diagnosis present

## 2020-09-06 DIAGNOSIS — D62 Acute posthemorrhagic anemia: Secondary | ICD-10-CM | POA: Diagnosis present

## 2020-09-06 DIAGNOSIS — I959 Hypotension, unspecified: Secondary | ICD-10-CM | POA: Diagnosis present

## 2020-09-06 DIAGNOSIS — Z809 Family history of malignant neoplasm, unspecified: Secondary | ICD-10-CM

## 2020-09-06 DIAGNOSIS — I120 Hypertensive chronic kidney disease with stage 5 chronic kidney disease or end stage renal disease: Secondary | ICD-10-CM | POA: Diagnosis present

## 2020-09-06 DIAGNOSIS — Z79899 Other long term (current) drug therapy: Secondary | ICD-10-CM

## 2020-09-06 DIAGNOSIS — R9389 Abnormal findings on diagnostic imaging of other specified body structures: Secondary | ICD-10-CM | POA: Diagnosis present

## 2020-09-06 DIAGNOSIS — K8020 Calculus of gallbladder without cholecystitis without obstruction: Secondary | ICD-10-CM | POA: Diagnosis present

## 2020-09-06 DIAGNOSIS — K2971 Gastritis, unspecified, with bleeding: Secondary | ICD-10-CM

## 2020-09-06 HISTORY — DX: Dependence on renal dialysis: N18.6

## 2020-09-06 HISTORY — PX: IR ANGIOGRAM VISCERAL SELECTIVE: IMG657

## 2020-09-06 HISTORY — PX: IR ANGIOGRAM SELECTIVE EACH ADDITIONAL VESSEL: IMG667

## 2020-09-06 HISTORY — PX: IR US GUIDE VASC ACCESS RIGHT: IMG2390

## 2020-09-06 HISTORY — DX: End stage renal disease: Z99.2

## 2020-09-06 HISTORY — PX: IR EMBO ART  VEN HEMORR LYMPH EXTRAV  INC GUIDE ROADMAPPING: IMG5450

## 2020-09-06 LAB — CBC WITH DIFFERENTIAL/PLATELET
Abs Immature Granulocytes: 0.02 10*3/uL (ref 0.00–0.07)
Basophils Absolute: 0.1 10*3/uL (ref 0.0–0.1)
Basophils Relative: 1 %
Eosinophils Absolute: 0.1 10*3/uL (ref 0.0–0.5)
Eosinophils Relative: 1 %
HCT: 33 % — ABNORMAL LOW (ref 36.0–46.0)
Hemoglobin: 10.5 g/dL — ABNORMAL LOW (ref 12.0–15.0)
Immature Granulocytes: 0 %
Lymphocytes Relative: 10 %
Lymphs Abs: 0.9 10*3/uL (ref 0.7–4.0)
MCH: 28.7 pg (ref 26.0–34.0)
MCHC: 31.8 g/dL (ref 30.0–36.0)
MCV: 90.2 fL (ref 80.0–100.0)
Monocytes Absolute: 0.5 10*3/uL (ref 0.1–1.0)
Monocytes Relative: 5 %
Neutro Abs: 7.4 10*3/uL (ref 1.7–7.7)
Neutrophils Relative %: 83 %
Platelets: 213 10*3/uL (ref 150–400)
RBC: 3.66 MIL/uL — ABNORMAL LOW (ref 3.87–5.11)
RDW: 18.4 % — ABNORMAL HIGH (ref 11.5–15.5)
WBC: 8.9 10*3/uL (ref 4.0–10.5)
nRBC: 0 % (ref 0.0–0.2)

## 2020-09-06 LAB — COMPREHENSIVE METABOLIC PANEL
ALT: 18 U/L (ref 0–44)
AST: 31 U/L (ref 15–41)
Albumin: 3.7 g/dL (ref 3.5–5.0)
Alkaline Phosphatase: 115 U/L (ref 38–126)
Anion gap: 15 (ref 5–15)
BUN: 50 mg/dL — ABNORMAL HIGH (ref 8–23)
CO2: 22 mmol/L (ref 22–32)
Calcium: 9.2 mg/dL (ref 8.9–10.3)
Chloride: 102 mmol/L (ref 98–111)
Creatinine, Ser: 9.63 mg/dL — ABNORMAL HIGH (ref 0.44–1.00)
GFR, Estimated: 4 mL/min — ABNORMAL LOW (ref >60)
Glucose, Bld: 116 mg/dL — ABNORMAL HIGH (ref 70–99)
Potassium: 6.1 mmol/L — ABNORMAL HIGH (ref 3.5–5.1)
Sodium: 139 mmol/L (ref 135–145)
Total Bilirubin: 0.6 mg/dL (ref 0.3–1.2)
Total Protein: 7.6 g/dL (ref 6.5–8.1)

## 2020-09-06 LAB — LACTIC ACID, PLASMA: Lactic Acid, Venous: 1.4 mmol/L (ref 0.5–1.9)

## 2020-09-06 LAB — CBC
HCT: 22.2 % — ABNORMAL LOW (ref 36.0–46.0)
Hemoglobin: 6.9 g/dL — CL (ref 12.0–15.0)
MCH: 28.2 pg (ref 26.0–34.0)
MCHC: 31.1 g/dL (ref 30.0–36.0)
MCV: 90.6 fL (ref 80.0–100.0)
Platelets: 181 10*3/uL (ref 150–400)
RBC: 2.45 MIL/uL — ABNORMAL LOW (ref 3.87–5.11)
RDW: 18.7 % — ABNORMAL HIGH (ref 11.5–15.5)
WBC: 9.3 10*3/uL (ref 4.0–10.5)
nRBC: 0.2 % (ref 0.0–0.2)

## 2020-09-06 LAB — HEMOGLOBIN AND HEMATOCRIT, BLOOD
HCT: 22.6 % — ABNORMAL LOW (ref 36.0–46.0)
Hemoglobin: 7.2 g/dL — ABNORMAL LOW (ref 12.0–15.0)

## 2020-09-06 LAB — POTASSIUM
Potassium: 6.3 mmol/L (ref 3.5–5.1)
Potassium: 6.2 mmol/L — ABNORMAL HIGH (ref 3.5–5.1)

## 2020-09-06 LAB — CBG MONITORING, ED: Glucose-Capillary: 119 mg/dL — ABNORMAL HIGH (ref 70–99)

## 2020-09-06 LAB — APTT: aPTT: 29 seconds (ref 24–36)

## 2020-09-06 LAB — POC OCCULT BLOOD, ED: Fecal Occult Bld: NEGATIVE

## 2020-09-06 LAB — PREPARE RBC (CROSSMATCH)

## 2020-09-06 LAB — PROTIME-INR
INR: 1 (ref 0.8–1.2)
Prothrombin Time: 13.5 seconds (ref 11.4–15.2)

## 2020-09-06 MED ORDER — IOHEXOL 350 MG/ML SOLN
80.0000 mL | Freq: Once | INTRAVENOUS | Status: AC | PRN
  Administered 2020-09-06: 80 mL via INTRAVENOUS

## 2020-09-06 MED ORDER — MIDAZOLAM HCL 2 MG/2ML IJ SOLN
INTRAMUSCULAR | Status: AC
  Filled 2020-09-06: qty 2

## 2020-09-06 MED ORDER — CALCIUM GLUCONATE 10 % IV SOLN
1.0000 g | Freq: Once | INTRAVENOUS | Status: AC
  Administered 2020-09-06: 1 g via INTRAVENOUS
  Filled 2020-09-06: qty 10

## 2020-09-06 MED ORDER — SODIUM CHLORIDE 0.9 % IV SOLN
8.0000 mg/h | INTRAVENOUS | Status: DC
  Filled 2020-09-06: qty 80

## 2020-09-06 MED ORDER — LIDOCAINE HCL (PF) 1 % IJ SOLN
INTRAMUSCULAR | Status: AC
  Filled 2020-09-06: qty 30

## 2020-09-06 MED ORDER — ALBUTEROL SULFATE (2.5 MG/3ML) 0.083% IN NEBU
10.0000 mg | INHALATION_SOLUTION | Freq: Once | RESPIRATORY_TRACT | Status: DC
  Filled 2020-09-06: qty 12

## 2020-09-06 MED ORDER — GELATIN ABSORBABLE 12-7 MM EX MISC
CUTANEOUS | Status: AC
  Filled 2020-09-06: qty 1

## 2020-09-06 MED ORDER — PANTOPRAZOLE SODIUM 40 MG PO TBEC
40.0000 mg | DELAYED_RELEASE_TABLET | Freq: Every day | ORAL | Status: DC
  Administered 2020-09-07 – 2020-09-09 (×3): 40 mg via ORAL
  Filled 2020-09-06 (×3): qty 1

## 2020-09-06 MED ORDER — INSULIN ASPART 100 UNIT/ML IV SOLN
5.0000 [IU] | Freq: Once | INTRAVENOUS | Status: AC
  Administered 2020-09-06: 5 [IU] via INTRAVENOUS

## 2020-09-06 MED ORDER — ATORVASTATIN CALCIUM 10 MG PO TABS
10.0000 mg | ORAL_TABLET | Freq: Every evening | ORAL | Status: DC
  Administered 2020-09-06 – 2020-09-08 (×3): 10 mg via ORAL
  Filled 2020-09-06 (×3): qty 1

## 2020-09-06 MED ORDER — MIDAZOLAM HCL 2 MG/2ML IJ SOLN
INTRAMUSCULAR | Status: AC | PRN
  Administered 2020-09-06: 0.5 mg via INTRAVENOUS

## 2020-09-06 MED ORDER — FENTANYL CITRATE (PF) 100 MCG/2ML IJ SOLN
INTRAMUSCULAR | Status: AC
  Filled 2020-09-06: qty 2

## 2020-09-06 MED ORDER — METOPROLOL TARTRATE 50 MG PO TABS
50.0000 mg | ORAL_TABLET | Freq: Two times a day (BID) | ORAL | Status: DC
  Administered 2020-09-06 – 2020-09-09 (×5): 50 mg via ORAL
  Filled 2020-09-06 (×6): qty 1

## 2020-09-06 MED ORDER — CINACALCET HCL 30 MG PO TABS
60.0000 mg | ORAL_TABLET | Freq: Every day | ORAL | Status: DC
  Administered 2020-09-06 – 2020-09-08 (×3): 60 mg via ORAL
  Filled 2020-09-06 (×3): qty 2

## 2020-09-06 MED ORDER — SODIUM CHLORIDE 0.9 % IV SOLN
80.0000 mg | Freq: Once | INTRAVENOUS | Status: AC
  Administered 2020-09-06: 80 mg via INTRAVENOUS
  Filled 2020-09-06: qty 80

## 2020-09-06 MED ORDER — DEXTROSE 50 % IV SOLN
1.0000 | Freq: Once | INTRAVENOUS | Status: AC
  Administered 2020-09-06: 50 mL via INTRAVENOUS
  Filled 2020-09-06: qty 50

## 2020-09-06 MED ORDER — IOHEXOL 300 MG/ML  SOLN
100.0000 mL | Freq: Once | INTRAMUSCULAR | Status: AC | PRN
  Administered 2020-09-06: 25 mL via INTRA_ARTERIAL

## 2020-09-06 MED ORDER — LIDOCAINE HCL (PF) 1 % IJ SOLN
INTRAMUSCULAR | Status: AC | PRN
  Administered 2020-09-06: 10 mL

## 2020-09-06 MED ORDER — SEVELAMER CARBONATE 800 MG PO TABS
2400.0000 mg | ORAL_TABLET | Freq: Three times a day (TID) | ORAL | Status: DC
  Administered 2020-09-07 – 2020-09-09 (×3): 2400 mg via ORAL
  Filled 2020-09-06 (×4): qty 3

## 2020-09-06 MED ORDER — HYDRALAZINE HCL 25 MG PO TABS
25.0000 mg | ORAL_TABLET | Freq: Two times a day (BID) | ORAL | Status: DC
  Administered 2020-09-06 – 2020-09-09 (×5): 25 mg via ORAL
  Filled 2020-09-06 (×6): qty 1

## 2020-09-06 MED ORDER — SODIUM BICARBONATE 8.4 % IV SOLN
50.0000 meq | Freq: Once | INTRAVENOUS | Status: AC
  Administered 2020-09-06: 50 meq via INTRAVENOUS
  Filled 2020-09-06: qty 50

## 2020-09-06 MED ORDER — LIDOCAINE-EPINEPHRINE 1 %-1:100000 IJ SOLN
INTRAMUSCULAR | Status: AC
  Filled 2020-09-06: qty 1

## 2020-09-06 MED ORDER — AMLODIPINE BESYLATE 10 MG PO TABS
10.0000 mg | ORAL_TABLET | Freq: Every evening | ORAL | Status: DC
  Administered 2020-09-06 – 2020-09-08 (×3): 10 mg via ORAL
  Filled 2020-09-06 (×4): qty 1

## 2020-09-06 MED ORDER — IOHEXOL 300 MG/ML  SOLN
150.0000 mL | Freq: Once | INTRAMUSCULAR | Status: AC | PRN
  Administered 2020-09-06: 30 mL via INTRA_ARTERIAL

## 2020-09-06 MED ORDER — SODIUM CHLORIDE 0.9% IV SOLUTION: Freq: Once | INTRAVENOUS | Status: AC

## 2020-09-06 MED ORDER — FENTANYL CITRATE (PF) 100 MCG/2ML IJ SOLN
INTRAMUSCULAR | Status: AC | PRN
  Administered 2020-09-06 (×2): 25 ug via INTRAVENOUS

## 2020-09-06 NOTE — Consult Note (Signed)
Renal Service Consult Note [facility]  Alexa Dougherty 09/06/2020 Sol Blazing, MD Requesting Physician: Dr Sheila Oats  Reason for Consult: ESRD pt w/ BRBPR  HPI: The patient is a 75 y.o. year-old w/ hx of HTN, HL, ESRD on HD presenting w/ c/o BRBPR.  Pt c/o 5-6 episodes of rectal bleeding started overnight, did not get any dialysis this morning due to bleeding. Sent to ED. In ED Hb 10.5, BUN 50  cR 9.6, K 6.1.  BP 190/ 90, HR 72  RR 16 temp 97 deg. Asked to see for ESRD.    Pt runs high K+'s at OP HD , usually close to 6.0.  No c/o this am other than rectal bleeding and mild lower abd pain.  No n/v or fevers, no N/V no diarrhea.     ROS  denies CP  no joint pain   no HA  no blurry vision  no rash  no diarrhea  no nausea/ vomiting   Past Medical History  Past Medical History:  Diagnosis Date  . Anemia   . Arthritis    Right shoulder  . Blood transfusion without reported diagnosis    Yrs ago when she had anemia  . Cataract    bilateral repair  . Colon polyps   . Elevated cholesterol   . ESRD on hemodialysis ([place])   . Gastritis and gastroduodenitis 12/29/2014  . GERD (gastroesophageal reflux disease)   . Headache(784.0)   . Hx of adenomatous colonic polyps 01/04/2015  . Hyperlipemia   . Hypertension    Past Surgical History  Past Surgical History:  Procedure Laterality Date  . AV FISTULA PLACEMENT  02/02/2012   Procedure: ARTERIOVENOUS (AV) FISTULA CREATION;  Surgeon: Angelia Mould, MD;  Location: [facility] OR;  Service: Vascular;  Laterality: Left;  . AV FISTULA REPAIR     Angioplasty of fistula multiple times Left  . COLONOSCOPY  2016  . COLONOSCOPY WITH PROPOFOL N/A 12/29/2014   Procedure: COLONOSCOPY WITH PROPOFOL;  Surgeon: Gatha Mayer, MD;  Location: [facility];  Service: Endoscopy;  Laterality: N/A;  . ESOPHAGOGASTRODUODENOSCOPY (EGD) WITH PROPOFOL N/A 12/29/2014   Procedure: ESOPHAGOGASTRODUODENOSCOPY (EGD) WITH PROPOFOL;  Surgeon: Gatha Mayer, MD;  Location: [facility]OS[facility];  Service: Endoscopy;  Laterality: N/A;  . FISTULOGRAM N/A 04/01/2012   Procedure: FISTULOGRAM;  Surgeon: Angelia Mould, MD;  Location[facility] CATH LAB;  Service: Cardiovascular;  Laterality: N/A;  . INSERTION OF DIALYSIS CATHETER  02/02/2012   Procedure: INSERTION OF DIALYSIS CATHETER;  Surgeon: Angelia Mould, MD;  Location: [place];  Service: Vascular;  Laterality: Right;  Insertion of 23cm dialysis catheter in Right Internal Jugular   . TUBAL LIGATION  1980   Family History  Family History  Problem Relation Age of Onset  . Hypertension Mother   . Cancer Sister        type unknown  . Cancer Brother        type unknown  . Hypertension Daughter   . Colon cancer Neg Hx   . Colon polyps Neg Hx   . Esophageal cancer Neg Hx   . Stomach cancer Neg Hx   . Rectal cancer Neg Hx    Social History  reports that she has never smoked. She has never used smokeless tobacco. She reports that she does not drink alcohol and does not use drugs. Allergies No Known Allergies Home medications Prior to Admission medications   Medication Sig Start Date End Date Taking? Authorizing Provider  acetaminophen (  TYLENOL) 500 MG tablet Take 500 mg by mouth every 6 (six) hours as needed for moderate pain or headache.   Yes [provider]  amLODipine (NORVASC) 10 MG tablet Take 1 tablet by mouth every evening. 10/14/14  Yes [provider]  atorvastatin (LIPITOR) 10 MG tablet Take 10 mg by mouth every evening.    Yes [provider]  cinacalcet (SENSIPAR) 60 MG tablet Take 60 mg by mouth at bedtime.   Yes [provider]  dexlansoprazole (DEXILANT) 60 MG capsule Take 60 mg by mouth daily.    Yes [provider]  hydrALAZINE (APRESOLINE) 25 MG tablet Take 25 mg by mouth in the morning and at bedtime.   Yes [provider]  lidocaine-prilocaine (EMLA) cream Apply 1 application topically See admin instructions. Apply a  small amount to access site (AVF) 1 to 2 hours before dialysis. Cover with occlusive dressing (saran warp). 08/27/18  Yes [provider]  metoprolol tartrate (LOPRESSOR) 50 MG tablet Take 1 tablet (50 mg total) by mouth 2 (two) times daily. 09/13/18  Yes Meccariello, Bernita Raisin, DO  multivitamin (RENA-VIT) TABS tablet Take 1 tablet by mouth daily.   Yes [provider]  sevelamer carbonate (RENVELA) 800 MG tablet Take 3 tablets (2,400 mg total) by mouth 3 (three) times daily with meals. 09/13/18  Yes Meccariello, Bernita Raisin, DO  hydrALAZINE (APRESOLINE) 50 MG tablet Take 1 tablet (50 mg total) by mouth every 8 (eight) hours. Patient not taking: No sig reported 09/13/18   Cleophas Dunker, DO     Vitals:   09/06/20 0750 09/06/20 0800 09/06/20 0830 09/06/20 0946  BP: (!) 145/74 (!) 160/26 (!) 187/89 (!) 191/90  Pulse: 81 70 72 72  Resp: 18 19 18 19   Temp:      TempSrc:      SpO2: 97% 98% 99% 99%  Height:       Exam Gen alert, no distress No rash, cyanosis or gangrene Sclera anicteric, throat clear  No jvd or bruits Chest clear bilat to bases, no rales/ wheezing RRR no MRG Abd soft ntnd no mass or ascites +bs GU defer MS no joint effusions or deformity Ext no LE or UE edema, no wounds or ulcers Neuro is alert, Ox 3 , nf  LUA AVF+bruit       Home meds:  - norvasc 10 hs/ hydralazine 25 bid/ lopressor 50 bid/ lipitor 10 hs  - renvela 3 ac tid/ dexilant 60 qd/ sensipar 60 hs  - prn's/ vitamins/ supplements       OP HD: MWF HD  3h 48min  400/500  68.5kg  2/2 bath  P2  15ga LUA AVF  Hep none  - mircera q2wks last 4/11  - venofer 50 q wk  - hect 5 ug tiw     Assessment/ Plan: 1. BRBPR - started overnight, Hb 10.2 here. Active bleeding. SP CTA of abdomen, results pending. K+ 6.1 but poor candidate for acute HD w/ active bleeding. No vol issues. Plan for max dose temporizing measures now and recheck K+ in 2 hrs. Keep NPO. Will follow.  2. ESRD - on HD MWF. As  above.  3. HTN - bp's up, cont home meds 4. MBD ckd - cont vdra, binder and sensipar 5. Anemia ckd - next esa due on 4/25 6. HL - per pmd      Kelly Splinter  MD 09/06/2020, 10:36 AM  Recent Labs  Lab 09/06/20 0627  WBC 8.9  HGB  10.5*   Recent Labs  Lab 09/06/20 0627  K 6.1*  BUN 50*  CREATININE 9.63*  CALCIUM 9.2

## 2020-09-06 NOTE — Consult Note (Addendum)
[facility] Consult: 9:45 AM 09/06/2020  LOS: 0 days    Referring Provider: Charlies Silvers ion ED.  Dr Billy Fischer in ED.    Primary Care Physician:  Bartholome Bill, MD Primary Gastroenterologist:  Dr. Carlean Purl  Patient interviewed using the assistance of video based translation but she understands English pretty well and often answered questionnaires directly when she understood the question rather than waiting for the translator.  Reason for Consultation:  Hematochezia.     HPI: Alexa Dougherty is a 75 y.o. female.  ESRD.  HD MWF x 8.5 years.  Htn.  Anemia.  12/2014 EGD.  For FOBT positive anemia.  Antral gastritis, pathology: Mild chronic inactive gastritis, no H. pylori. 12/2014 colonoscopy.  For heme positive anemia, unexplained bleeding.  Total of 8 polyps removed from the cecum, descending, sigmoid, transverse colon.  Size 2 to 7 mm.  Pandiverticulosis.  Nonbleeding internal hemorrhoids.  Polyp pathology: All TA's w/o HGD.  04/2015 VCE.  Evaluation melena, anemia.  Complete study, fair prep.  Antral gastritis as seen at EGD but otherwise normal study.    Home meds do not include any gastric acid suppressing medications, antiplatelet meds or anticoagulation including no aspirin.  No NSAIDs..   At baseline no reflux symptoms, no abdominal pain, no altered bowel habits, no bleeding per rectum. Early this morning developed onset of gross hematochezia and pain in the epigastric, left abdominal area.  Pain was not severe.  No nausea, vomiting, chills, fever, dizziness, angina, dyspnea.  In total she has had 5 episodes the last was about 945 this morning. Patient went to hemodialysis but they sent her to the emergency room.   BP 140s-190s/70s-90s.  Heart rate 70s to 145.  Excellent room air sats. FOBT  negative. Hgb 10.5, was 10.9 in late January 2022.  MCV 90.  Platelets 213. Potassium 6.1. CTAP w angio: Completed with pending results/reading.   Patient originally from [place].  Lives with her daughter in [place].  Does not smoke or drink alcohol.  Past Medical History:  Diagnosis Date  . Anemia   . Arthritis    Right shoulder  . Blood transfusion without reported diagnosis    Yrs ago when she had anemia  . Cataract    bilateral repair  . Colon polyps   . Elevated cholesterol   . ESRD on hemodialysis ([place])   . Gastritis and gastroduodenitis 12/29/2014  . GERD (gastroesophageal reflux disease)   . Headache(784.0)   . Hx of adenomatous colonic polyps 01/04/2015  . Hyperlipemia   . Hypertension     Past Surgical History:  Procedure Laterality Date  . AV FISTULA PLACEMENT  02/02/2012   Procedure: ARTERIOVENOUS (AV) FISTULA CREATION;  Surgeon: Angelia Mould, MD;  Location: [facility] OR;  Service: Vascular;  Laterality: Left;  . AV FISTULA REPAIR     Angioplasty of fistula multiple times Left  . COLONOSCOPY  2016  . COLONOSCOPY WITH PROPOFOL N/A 12/29/2014   Procedure: COLONOSCOPY WITH PROPOFOL;  Surgeon: Gatha Mayer, MD;  Location: [facility] ENDOSCOPY;  Service: Endoscopy;  Laterality:  N/A;  . ESOPHAGOGASTRODUODENOSCOPY (EGD) WITH PROPOFOL N/A 12/29/2014   Procedure: ESOPHAGOGASTRODUODENOSCOPY (EGD) WITH PROPOFOL;  Surgeon: Gatha Mayer, MD;  Location: [facility] ENDOSCOPY;  Service: Endoscopy;  Laterality: N/A;  . FISTULOGRAM N/A 04/01/2012   Procedure: FISTULOGRAM;  Surgeon: Angelia Mould, MD;  Location: [facility] CATH LAB;  Service: Cardiovascular;  Laterality: N/A;  . INSERTION OF DIALYSIS CATHETER  02/02/2012   Procedure: INSERTION OF DIALYSIS CATHETER;  Surgeon: Angelia Mould, MD;  Location: [facility];  Service: Vascular;  Laterality: Right;  Insertion of 23cm dialysis catheter in Right Internal Jugular   . TUBAL LIGATION  1980    Prior to Admission medications   Medication Sig  Start Date End Date Taking? Authorizing Provider  acetaminophen (TYLENOL) 500 MG tablet Take 500 mg by mouth every 6 (six) hours as needed for moderate pain or headache.   Yes [provider]  amLODipine (NORVASC) 10 MG tablet Take 1 tablet by mouth every evening. 10/14/14  Yes [provider]  atorvastatin (LIPITOR) 10 MG tablet Take 10 mg by mouth every evening.    Yes [provider]  cinacalcet (SENSIPAR) 60 MG tablet Take 60 mg by mouth at bedtime.   Yes [provider]  dexlansoprazole (DEXILANT) 60 MG capsule Take 60 mg by mouth daily.    Yes [provider]  hydrALAZINE (APRESOLINE) 25 MG tablet Take 25 mg by mouth in the morning and at bedtime.   Yes [provider]  lidocaine-prilocaine (EMLA) cream Apply 1 application topically See admin instructions. Apply a small amount to access site (AVF) 1 to 2 hours before dialysis. Cover with occlusive dressing (saran warp). 08/27/18  Yes [provider]  metoprolol tartrate (LOPRESSOR) 50 MG tablet Take 1 tablet (50 mg total) by mouth 2 (two) times daily. 09/13/18  Yes Meccariello, Bernita Raisin, DO  multivitamin (RENA-VIT) TABS tablet Take 1 tablet by mouth daily.   Yes [provider]  sevelamer carbonate (RENVELA) 800 MG tablet Take 3 tablets (2,400 mg total) by mouth 3 (three) times daily with meals. 09/13/18  Yes Meccariello, Bernita Raisin, DO  hydrALAZINE (APRESOLINE) 50 MG tablet Take 1 tablet (50 mg total) by mouth every 8 (eight) hours. Patient not taking: No sig reported 09/13/18   Meccariello, Bernita Raisin, DO    Scheduled Meds:  Infusions:  PRN Meds:    Allergies as of 09/06/2020  . (No Known Allergies)    Family History  Problem Relation Age of Onset  . Hypertension Mother   . Cancer Sister        type unknown  . Cancer Brother        type unknown  . Hypertension Daughter   . Colon cancer Neg Hx   . Colon polyps Neg Hx   . Esophageal cancer Neg Hx   . Stomach  cancer Neg Hx   . Rectal cancer Neg Hx     Social History   Socioeconomic History  . Marital status: Widowed    Spouse name: Not on file  . Number of children: 7  . Years of education: Not on file  . Highest education level: Not on file  Occupational History  . Occupation: retired  Tobacco Use  . Smoking status: Never Smoker  . Smokeless tobacco: Never Used  Vaping Use  . Vaping Use: Never used  Substance and Sexual Activity  . Alcohol use: No  . Drug use: No  . Sexual activity: Not on file  Other Topics Concern  .  Not on file  Social History Narrative  . Not on file   Social Determinants of Health   Financial Resource Strain: Not on file  Food Insecurity: Not on file  Transportation Needs: Not on file  Physical Activity: Not on file  Stress: Not on file  Social Connections: Not on file  Intimate Partner Violence: Not on file    REVIEW OF SYSTEMS: Constitutional: No weakness, no dizziness. ENT:  No nose bleeds Pulm: Occasionally gets dyspnea, not necessarily with exertion.  This is not severe.  No cough. CV:  No palpitations, no LE edema.  No angina. GU: Oliguria but still makes some urine.  No hematuria, no frequency GI: See HPI Heme: [place] unusual or excessive bleeding or bruising. Transfusions:  Received transfusion with PRBCs in 2013, 2016 and 2018 Neuro:  No headaches, no peripheral tingling or numbness Derm:  No itching, no rash or sores.  Endocrine:  No sweats or chills.  No polyuria or dysuria Immunization: She received Pfizer COVID 19 vaccine in 01/2020.  Moderna COVID-vaccine in February and March 2021. Travel:  None beyond local counties in last few months.    PHYSICAL EXAM: Vital signs in last 24 hours: Vitals:   09/06/20 0800 09/06/20 0830  BP: (!) 160/26 (!) 187/89  Pulse: 70 72  Resp: 19 18  Temp:    SpO2: 98% 99%   Wt Readings from Last 3 Encounters:  04/01/20 72.6 kg  03/09/20 72.8 kg  09/13/18 71.9 kg    General: Pleasant, does  not look ill.  Comfortable, alert, NAD. Head: No facial asymmetry or swelling.  No signs of head trauma. Eyes: No scleral icterus.  Conjunctiva pale.  EOMI. Ears: Not hard of hearing. Nose: No congestion or discharge. Mouth: Oropharynx moist, pink, clear.  Dark staining without lesions on the tongue.  Tongue midline.  Fair dentition. Neck: No JVD Lungs: Excellent breath sounds.  Lungs clear bilaterally.  No labored breathing or cough Heart: RRR.  No MRG.  S1, S2 present. Abdomen: Soft.  Mild epigastric/left upper abdominal discomfort without guarding or rebound.  Not distended.  Active bowel sounds.  No HSM, masses, bruits, hernias Rectal: Deferred Musc/Skeltl: No joint redness, swelling or gross deformity. Extremities: No CCE.  AV fistula visible on right upper extremity. Neurologic: Oriented x3.  No tremors.  No gross deficits.  Moves all 4 limbs without difficulty. Skin: No rash, no sores, no telangiectasia. Nodes: No cervical adenopathy. Psych: Cooperative, calm, pleasant.  Intake/Output from previous day: No intake/output data recorded. Intake/Output this shift: No intake/output data recorded.  LAB RESULTS: Recent Labs    09/06/20 0627  WBC 8.9  HGB 10.5*  HCT 33.0*  PLT 213   BMET Lab Results  Component Value Date   NA 139 09/06/2020   NA 141 06/14/2020   NA 139 06/14/2020   K 6.1 (H) 09/06/2020   K 6.1 (H) 06/14/2020   K 5.9 (H) 06/14/2020   CL 102 09/06/2020   CL 100 06/14/2020   CL 94 (L) 09/13/2018   CO2 22 09/06/2020   CO2 23 06/14/2020   CO2 24 09/13/2018   GLUCOSE 116 (H) 09/06/2020   GLUCOSE 119 (H) 06/14/2020   GLUCOSE 114 (H) 09/13/2018   BUN 50 (H) 09/06/2020   BUN 52 (H) 06/14/2020   BUN 59 (H) 09/13/2018   CREATININE 9.63 (H) 09/06/2020   CREATININE 9.35 (H) 06/14/2020   CREATININE 9.12 (H) 09/13/2018   CALCIUM 9.2 09/06/2020   CALCIUM 8.8 (L) 06/14/2020  CALCIUM 9.5 09/13/2018   LFT Recent Labs    09/06/20 0627  PROT 7.6  ALBUMIN  3.7  AST 31  ALT 18  ALKPHOS 115  BILITOT 0.6   PT/INR Lab Results  Component Value Date   INR 1.0 09/06/2020   INR 1.0 06/14/2020   INR 0.98 09/01/2016   Hepatitis Panel No results for input(s): HEPBSAG, HCVAB, HEPAIGM, HEPBIGM in the last 72 hours. C-Diff No components found for: CDIFF Lipase     Component Value Date/Time   LIPASE 45 12/12/2007 2010    Drugs of Abuse     Component Value Date/Time   LABOPIA NONE DETECTED 11/07/2007 1614   COCAINSCRNUR NONE DETECTED 11/07/2007 1614   LABBENZ NONE DETECTED 11/07/2007 1614   AMPHETMU NONE DETECTED 11/07/2007 1614   THCU NONE DETECTED 11/07/2007 1614   LABBARB  11/07/2007 1614    NONE DETECTED        DRUG SCREEN FOR MEDICAL PURPOSES ONLY.  IF CONFIRMATION IS NEEDED FOR ANY PURPOSE, NOTIFY LAB WITHIN 5 DAYS.     RADIOLOGY STUDIES: No results found.    IMPRESSION:   *   Acute hematochezia Multiple TA polyps and widespread diverticulosis,  Non bleeding rrhoids on colonoscopy 2016.  Suspect diverticular bleed.      PLAN:     *   Await reading of CTAP with angio.  *   Clear liquids.  *    no need for PPI.  *    follow CBC, could check every 8 hours or every 5 a 5 PM.   Alexa Dougherty  09/06/2020, 9:45 AM Phone [phone]   Attending physician's note  Reviewed and agree with documentation and assessment and plan.  75 year old female with history of end-stage renal disease on hemodialysis presented with acute hematochezia CT angio abdomen pelvis positive for acute bleeding from right-sided colonic diverticula near hepatic flexure, is currently in IR suite for embolization  Monitor CBC and transfuse to maintain greater than 7  GI is available if needed, please call with any questions    K. Denzil Magnuson , MD [phone]

## 2020-09-06 NOTE — ED Notes (Signed)
Patient transported to IR 

## 2020-09-06 NOTE — ED Notes (Signed)
Patient transported to CT 

## 2020-09-06 NOTE — ED Triage Notes (Signed)
Pt arrived from Dialysis with c/c of GI bleed. Per EMS pt has had 5-6 episodes of bloody (bright red stool). Pt was not able to complete dialysis due to bloody stool. Per EMS this started prior to dialysis but daughter said priority was for pt to go to dialysis.  188/98, 72HR, 100%RA, BS 137

## 2020-09-06 NOTE — Progress Notes (Signed)
Spoke with [employer] on 5w.  They can take femoral stick as long as the sheath is not left in.

## 2020-09-06 NOTE — Procedures (Signed)
Interventional Radiology Procedure Note  Procedure:  1) Superior mesenteric angiogram 2) Right colic arteriogram 3) Distal right colic gelfoam and coil embolization  Findings: Please refer to procedural dictation for full description. Focal active extravasation visualized from antimesenteric aspect of the proximal hepatic flexure arising from right colic branch.  Selective catheterization and small volume gelfoam embolization followed by coil embolization.  6 Fr Angioseal closure of right common femoral artery.  Complications: None immediate  Estimated Blood Loss: < 5 mL  Recommendations: Strict bedrest flat for 2 hours (until 18:00) then head up bed up to 30 degrees for 2 hours (until 20:00). Continue to monitor hemodynamics. Expect continued melena up to a couple of days. IR will follow.   Ruthann Cancer, MD Pager: [phone]

## 2020-09-06 NOTE — Progress Notes (Signed)
Chief Complaint: Patient was seen in consultation today for acute GI bleed  Referring Physician(s): Gerre Couch PA-C  Supervising Physician: Ruthann Cancer  Patient Status: [facility] - In-pt  History of Present Illness: Alexa Dougherty is a 75 y.o. female with hx of ESRD on HD. Started having BRBPR early this am that has not really stopped. Tried to go to HD today, but was brought to ER by EMS. 3 phase CTA shows evidence of active extrav in the right colon/hepatic flexure. GI was consulted and has asked IR eval for angiogram and embolization. PMHx, meds, labs, imaging, allergies reviewed. Feels well, no recent fevers, chills, illness. Has been NPO    Past Medical History:  Diagnosis Date  . Anemia   . Arthritis    Right shoulder  . Blood transfusion without reported diagnosis    Yrs ago when she had anemia  . Cataract    bilateral repair  . Colon polyps   . Elevated cholesterol   . ESRD on hemodialysis ([place])   . Gastritis and gastroduodenitis 12/29/2014  . GERD (gastroesophageal reflux disease)   . Headache(784.0)   . Hx of adenomatous colonic polyps 01/04/2015  . Hyperlipemia   . Hypertension     Past Surgical History:  Procedure Laterality Date  . AV FISTULA PLACEMENT  02/02/2012   Procedure: ARTERIOVENOUS (AV) FISTULA CREATION;  Surgeon: Angelia Mould, MD;  Location: [facility] OR;  Service: Vascular;  Laterality: Left;  . AV FISTULA REPAIR     Angioplasty of fistula multiple times Left  . COLONOSCOPY  2016  . COLONOSCOPY WITH PROPOFOL N/A 12/29/2014   Procedure: COLONOSCOPY WITH PROPOFOL;  Surgeon: Gatha Mayer, MD;  Location: [facility] ENDOSCOPY;  Service: Endoscopy;  Laterality: N/A;  . ESOPHAGOGASTRODUODENOSCOPY (EGD) WITH PROPOFOL N/A 12/29/2014   Procedure: ESOPHAGOGASTRODUODENOSCOPY (EGD) WITH PROPOFOL;  Surgeon: Gatha Mayer, MD;  Location: [facility] ENDOSCOPY;  Service: Endoscopy;  Laterality: N/A;  . FISTULOGRAM N/A 04/01/2012   Procedure: FISTULOGRAM;  Surgeon:  Angelia Mould, MD;  Location: [facility] CATH LAB;  Service: Cardiovascular;  Laterality: N/A;  . INSERTION OF DIALYSIS CATHETER  02/02/2012   Procedure: INSERTION OF DIALYSIS CATHETER;  Surgeon: Angelia Mould, MD;  Location: [place];  Service: Vascular;  Laterality: Right;  Insertion of 23cm dialysis catheter in Right Internal Jugular   . TUBAL LIGATION  1980    Allergies: Patient has no known allergies.  Medications: Prior to Admission medications   Medication Sig Start Date End Date Taking? Authorizing Provider  acetaminophen (TYLENOL) 500 MG tablet Take 500 mg by mouth every 6 (six) hours as needed for moderate pain or headache.   Yes [provider]  amLODipine (NORVASC) 10 MG tablet Take 1 tablet by mouth every evening. 10/14/14  Yes [provider]  atorvastatin (LIPITOR) 10 MG tablet Take 10 mg by mouth every evening.    Yes [provider]  cinacalcet (SENSIPAR) 60 MG tablet Take 60 mg by mouth at bedtime.   Yes [provider]  dexlansoprazole (DEXILANT) 60 MG capsule Take 60 mg by mouth daily.    Yes [provider]  hydrALAZINE (APRESOLINE) 25 MG tablet Take 25 mg by mouth in the morning and at bedtime.   Yes [provider]  lidocaine-prilocaine (EMLA) cream Apply 1 application topically See admin instructions. Apply a small amount to access site (AVF) 1 to 2 hours before dialysis. Cover with occlusive dressing (saran warp). 08/27/18  Yes [provider]  metoprolol tartrate (LOPRESSOR) 50 MG  tablet Take 1 tablet (50 mg total) by mouth 2 (two) times daily. 09/13/18  Yes Meccariello, Bernita Raisin, DO  multivitamin (RENA-VIT) TABS tablet Take 1 tablet by mouth daily.   Yes [provider]  sevelamer carbonate (RENVELA) 800 MG tablet Take 3 tablets (2,400 mg total) by mouth 3 (three) times daily with meals. 09/13/18  Yes Meccariello, Bernita Raisin, DO  hydrALAZINE (APRESOLINE) 50 MG tablet Take 1 tablet (50 mg total) by  mouth every 8 (eight) hours. Patient not taking: No sig reported 09/13/18   Meccariello, Bernita Raisin, DO     Family History  Problem Relation Age of Onset  . Hypertension Mother   . Cancer Sister        type unknown  . Cancer Brother        type unknown  . Hypertension Daughter   . Colon cancer Neg Hx   . Colon polyps Neg Hx   . Esophageal cancer Neg Hx   . Stomach cancer Neg Hx   . Rectal cancer Neg Hx     Social History   Socioeconomic History  . Marital status: Widowed    Spouse name: Not on file  . Number of children: 7  . Years of education: Not on file  . Highest education level: Not on file  Occupational History  . Occupation: retired  Tobacco Use  . Smoking status: Never Smoker  . Smokeless tobacco: Never Used  Vaping Use  . Vaping Use: Never used  Substance and Sexual Activity  . Alcohol use: No  . Drug use: No  . Sexual activity: Not on file  Other Topics Concern  . Not on file  Social History Narrative  . Not on file   Social Determinants of Health   Financial Resource Strain: Not on file  Food Insecurity: Not on file  Transportation Needs: Not on file  Physical Activity: Not on file  Stress: Not on file  Social Connections: Not on file    Review of Systems: A 12 point ROS discussed and pertinent positives are indicated in the HPI above.  All other systems are negative.  Review of Systems  Vital Signs: BP (!) 204/89   Pulse 74   Temp 97.6 F (36.4 C) (Oral)   Resp 20   Ht 5' (1.524 m)   SpO2 98%   BMI 31.25 kg/m   Physical Exam Constitutional:      General: She is not in acute distress.    Appearance: She is not ill-appearing.  HENT:     Mouth/Throat:     Mouth: Mucous membranes are moist.     Pharynx: Oropharynx is clear.  Cardiovascular:     Rate and Rhythm: Normal rate and regular rhythm.     Pulses: Normal pulses.     Heart sounds: Normal heart sounds.     Comments: Palpable femoral pulses bilat Pulmonary:     Effort:  Pulmonary effort is normal. No respiratory distress.     Breath sounds: Normal breath sounds.  Abdominal:     General: Abdomen is flat. There is no distension.     Palpations: Abdomen is soft.     Tenderness: There is no abdominal tenderness.  Skin:    General: Skin is warm and dry.  Neurological:     General: No focal deficit present.     Mental Status: She is alert and oriented to person, place, and time.  Psychiatric:        Mood and Affect: Mood normal.  Thought Content: Thought content normal.        Judgment: Judgment normal.       Imaging: CT Angio Abd/Pel W and/or Wo Contrast  Result Date: 09/06/2020 CLINICAL DATA:  GI bleed, bloody diarrhea and abdominal pain. EXAM: CT ANGIOGRAPHY ABDOMEN AND PELVIS WITH CONTRAST AND WITHOUT CONTRAST TECHNIQUE: Multidetector CT imaging of the abdomen and pelvis was performed using the standard protocol during bolus administration of intravenous contrast. Multiplanar reconstructed images and MIPs were obtained and reviewed to evaluate the vascular anatomy. CONTRAST:  29mL OMNIPAQUE IOHEXOL 350 MG/ML SOLN COMPARISON:  CT of the abdomen and pelvis on 03/06/2015 FINDINGS: VASCULAR Aorta: Aortic atherosclerosis with calcified plaque present. No evidence of abdominal aortic aneurysm. 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 Distal branch vessels are patent and demonstrate normal branching anatomy. SMA: Roughly 50% stenosis at the origin of the SMA. SMA trunk and branch vessels are normally patent. See below for description of colonic bleed. Renals: Small caliber and patent bilateral renal arteries. IMA: The IMA is patent. Inflow: Iliac atherosclerosis without evidence of significant obstructive disease or aneurysmal disease bilaterally. Proximal Outflow: Normally patent bilateral common femoral arteries and femoral bifurcations. Veins: Venous phase imaging demonstrates no venous abnormalities in the abdomen or pelvis.  Review of the MIP images confirms the above findings. NON-VASCULAR Lower chest: No acute abnormality. Hepatobiliary: No focal liver abnormality is seen. Small calcified gallstones present. No gallbladder wall thickening, or biliary dilatation. Pancreas: Unremarkable. No pancreatic ductal dilatation or surrounding inflammatory changes. Spleen: Normal in size without focal abnormality. Adrenals/Urinary Tract: No adrenal masses. Atrophic kidneys consistent with known end-stage renal disease. Stomach/Bowel: Positive for acute bleeding from the level of a right-sided colonic diverticulum near the hepatic flexure on the arterial phase of imaging into the colonic lumen with further accumulation of extravasated contrast on the venous phase of imaging. No other source of GI bleed identified on the CTA. Diverticulosis of the colon is primarily right-sided and extending into the transverse colon. No evidence of bowel obstruction, focal visible bowel lesion or free intraperitoneal air. Lymphatic: No enlarged abdominal or pelvic lymph nodes. Reproductive: Uterus and bilateral adnexa are unremarkable. Other: No abdominal wall hernia or abnormality. No abdominopelvic ascites. Musculoskeletal: No acute or significant osseous findings. IMPRESSION: 1. Positive for acute bleeding from the level of a right-sided colonic diverticulum near the hepatic flexure with further accumulation of extravasated contrast on the venous phase of imaging. No other source of GI bleed identified on the CTA. 2. 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 3. Atrophic kidneys consistent with known end-stage renal disease. 4. Cholelithiasis. 5. These results were called by telephone at the time of interpretation on 09/06/2020 at 1055 hrs to provider Marcene Brawn, PA-C, who verbally acknowledged these results. Electronically Signed   By: Aletta Edouard M.D.   On: 09/06/2020 11:01     Labs:  CBC: Recent Labs    06/14/20 0253 06/14/20 0309 09/06/20 0627  WBC 13.0*  --  8.9  HGB 10.6* 10.9* 10.5*  HCT 35.3* 32.0* 33.0*  PLT 243  --  213    COAGS: Recent Labs    06/14/20 0253 09/06/20 0627  INR 1.0 1.0  APTT  --  29    BMP: Recent Labs    06/14/20 0253 06/14/20 0309 09/06/20 0627  NA 139 141 139  K 5.9* 6.1* 6.1*  CL 100  --  102  CO2  23  --  22  GLUCOSE 119*  --  116*  BUN 52*  --  50*  CALCIUM 8.8*  --  9.2  CREATININE 9.35*  --  9.63*  GFRNONAA 4*  --  4*    LIVER FUNCTION TESTS: Recent Labs    06/14/20 0253 09/06/20 0627  BILITOT 0.7 0.6  AST 32 31  ALT 18 18  ALKPHOS 130* 115  PROT 7.3 7.6  ALBUMIN 3.5 3.7    TUMOR MARKERS: No results for input(s): AFPTM, CEA, CA199, CHROMGRNA in the last 8760 hours.  Assessment and Plan: Acute lower GI bleed. Right colon/hepatic flexure diverticular after review of CTA Will proceed with angiogram and possible embolization Pt stable Risks and benefits of angio with ambolization were discussed with the patient including, but not limited to bleeding, infection, vascular injury or contrast induced renal failure.  This interventional procedure involves the use of X-rays and because of the nature of the planned procedure, it is possible that we will have prolonged use of X-ray fluoroscopy.  Potential radiation risks to you include (but are not limited to) the following: - A slightly elevated risk for cancer  several years later in life. This risk is typically less than 0.5% percent. This risk is low in comparison to the normal incidence of human cancer, which is 33% for women and 50% for men according to the [place]. - Radiation induced injury can include skin redness, resembling a rash, tissue breakdown / ulcers and hair loss (which can be temporary or permanent).   The likelihood of either of these occurring depends on the difficulty of the procedure and whether you are  sensitive to radiation due to previous procedures, disease, or genetic conditions.   IF your procedure requires a prolonged use of radiation, you will be notified and given written instructions for further action.  It is your responsibility to monitor the irradiated area for the 2 weeks following the procedure and to notify your physician if you are concerned that you have suffered a radiation induced injury.    All of the patient's questions were answered, patient is agreeable to proceed.  Consent signed and in chart.   Thank you for this interesting consult.  I greatly enjoyed meeting Alexa Dougherty and look forward to participating in their care.  A copy of this report was sent to the requesting provider on this date.  Electronically Signed: Ascencion Dike, PA-C 09/06/2020, 11:54 AM   I spent a total of 30 minutes in face to face in clinical consultation, greater than 50% of which was counseling/coordinating care for lower GI bleed/angiogram

## 2020-09-06 NOTE — Sedation Documentation (Signed)
Pt transported to 5W37 via stretcher accompanied by RNs.Gabe RN at bedside to receive pt. Handoff completed. Right groin site remains level 0 and distal bilateral lower extremity pulses palpable.No s/s of distress at this time.

## 2020-09-06 NOTE — Sedation Documentation (Signed)
6 fr Angioseal deployed

## 2020-09-06 NOTE — H&P (Signed)
Date: 09/06/2020               Patient Name:  Alexa Dougherty MRN: [id number]  DOB: [birth year] Age / Sex: 75 y.o., female   PCP: Bartholome Bill, MD         Medical Service: Internal Medicine Teaching Service         Attending Physician: Dr. Sid Falcon, MD    First Contact: Dr. Alfonse Spruce Pager: [phone]  Second Contact: Dr. Darrick Meigs Pager: [phone]       After Hours (After 5p/  First Contact Pager: [phone]  weekends / holidays): Second Contact Pager: [phone]   Chief Complaint: bloody stools   History of Present Illness: Ms. Seaborn is a 75 y/o woman with history of ESRD on HD MWW, HTN, HLD who presents after several episodes of BRBPR. History was obtained with assistance of [place] phone interpreter, but she also speaks and understands English fairly well.  She notes that around 2 am this morning she developed abdominal pain with subsequent urge to have a BM. She saw predominantly bright red blood. Her daughter was aware, but she otherwise seemed ok to go to dialysis this morning. When she got to dialysis she had 2-3 more episodes and was sent to the ED for further evaluation before dialysis was initiated.  Last BM was 9:45 this morning and was primarily blood.  She recalls having 1-2 blood transfusions several years ago when she first started dialysis.  She denies fevers, chills, light headedness, URI symptoms, chest pain, shortness of breath. She makes minimal urine on HD.  She is not on antiplatelet or anticoagulation. No NSAIDs or EtOH use.    Meds:  Current Meds  Medication Sig  . acetaminophen (TYLENOL) 500 MG tablet Take 500 mg by mouth every 6 (six) hours as needed for moderate pain or headache.  Marland Kitchen amLODipine (NORVASC) 10 MG tablet Take 1 tablet by mouth every evening.  Marland Kitchen atorvastatin (LIPITOR) 10 MG tablet Take 10 mg by mouth every evening.   . cinacalcet (SENSIPAR) 60 MG tablet Take 60 mg by mouth at bedtime.  Marland Kitchen dexlansoprazole (DEXILANT) 60 MG capsule Take  60 mg by mouth daily.   . hydrALAZINE (APRESOLINE) 25 MG tablet Take 25 mg by mouth in the morning and at bedtime.  . lidocaine-prilocaine (EMLA) cream Apply 1 application topically See admin instructions. Apply a small amount to access site (AVF) 1 to 2 hours before dialysis. Cover with occlusive dressing (saran warp).  . metoprolol tartrate (LOPRESSOR) 50 MG tablet Take 1 tablet (50 mg total) by mouth 2 (two) times daily.  . multivitamin (RENA-VIT) TABS tablet Take 1 tablet by mouth daily.  . sevelamer carbonate (RENVELA) 800 MG tablet Take 3 tablets (2,400 mg total) by mouth 3 (three) times daily with meals.  . [DISCONTINUED] b complex-vitamin c-folic acid (NEPHRO-VITE) 0.8 MG TABS Take 1 tablet by mouth every morning.      Allergies: Allergies as of 09/06/2020  . (No Known Allergies)   Past Medical History:  Diagnosis Date  . Anemia   . Arthritis    Right shoulder  . Blood transfusion without reported diagnosis    Yrs ago when she had anemia  . Cataract    bilateral repair  . Colon polyps   . Elevated cholesterol   . ESRD on hemodialysis ([place])   . Gastritis and gastroduodenitis 12/29/2014  . GERD (gastroesophageal reflux disease)   . Headache(784.0)   . Hx of adenomatous colonic polyps 01/04/2015  .  Hyperlipemia   . Hypertension     Family History:  Family History  Problem Relation Age of Onset  . Hypertension Mother   . Cancer Sister        type unknown  . Cancer Brother        type unknown  . Hypertension Daughter   . Colon cancer Neg Hx   . Colon polyps Neg Hx   . Esophageal cancer Neg Hx   . Stomach cancer Neg Hx   . Rectal cancer Neg Hx      Social History: lives with her daughter in [place]. Denies tobacco, EtOH or illicit substance use  Review of Systems: A complete ROS was negative except as per HPI.   Physical Exam: Blood pressure (!) 191/90, pulse 72, temperature 97.6 F (36.4 C), temperature source Oral, resp. rate 19, height 5' (1.524 m), SpO2  99 %. General: awake, alert, pleasant female sitting up in bed in NAD HEENT: /AT; mild conjunctival pallor. EOMI. Orophayrnx is moist, pink without erythema or exudates. Tongue is stained dark without lesions  Neck: No JVD CV: RRR; no m/r/g Pulm: normal work of breathing on room air; lungs CTAB  Abd: BS+; abdomen is soft with mild epigastric tenderness. No rebound or guarding.  Ext: warm and well perfused; no edema. LUE AVF  Neuro: A&Ox4; no focal deficits  EKG: personally reviewed my interpretation is NSR; acute ischemic changes or changes related to hyperkalemia   Assessment & Plan by Problem: Active Problems:   Lower GI bleed  Ms. Spurgin is a 75 y/o woman with history of ESRD on HD MWW, HTN, HLD who presents after several episodes of BRBPR.  LGIB 2/2 diverticular bleed -patient is hemodynamically stable; hgb 10.2 on admission -CT angio demonstrated acute bleed from colonic diverticulum at hepatic flexure  -IR has been consulted to determine if bleed would be amenable to intervention -monitor CBC q 12 hrs  ESRD on HD MWF -patient was sent from HD prior to beginning session this morning -she is hyperkalemic to 6.1. reportedly this is fairly typical for her.  -appreciate nephrology consult and management -plan is for temporizing measure acutely, followed by HD session later once she has stabilized from a bleeding standpoint   HTN -persistently hypertensive since arrival  -continue home lopressor, amlodipine, and hydralazine  HLD -continue home statin  DVT ppx: SCDs Diet: NPO, clear liquid CODE: FULL  Dispo: Admit patient to Observation with expected length of stay less than 2 midnights.  SignedDelice Bison, DO 09/06/2020, 10:44 AM  Pager: [phone] After 5pm on weekdays and 1pm on weekends: On Call pager: [phone]

## 2020-09-06 NOTE — ED Notes (Signed)
This RN called 5W to give report for this pt that will be going to IR for procedure soon. Return number was left for the accepting RN to call back.

## 2020-09-06 NOTE — ED Provider Notes (Signed)
[facility] EMERGENCY DEPARTMENT Provider Note   CSN: [id number] Arrival date & time: 09/06/20  4665     History Chief Complaint  Patient presents with  . GI Problem    Pt arrived from Dialysis with c/c of GI bleed. Per EMS pt has had 5-6 episodes of bloody (bright red stool). Pt was not able to complete dialysis due to bloody stool. Per EMS this started prior to dialysis but daughter said priority was for pt to go to dialysis.  188/98, 72HR, 100%RA, BS [place] is a 75 y.o. female.  The history is provided by the patient. No language interpreter was used.  GI Problem This is a new problem. The current episode started 3 to 5 hours ago. The problem occurs constantly. The problem has not changed since onset.Associated symptoms include abdominal pain. Nothing aggravates the symptoms. Nothing relieves the symptoms. She has tried nothing for the symptoms. The treatment provided no relief.   Pt had bright red rectal bleeding this am,  Pt went to dialysis center and was sent here. Pt did not have dialysis      Past Medical History:  Diagnosis Date  . Anemia   . Arthritis    Right shoulder  . Blood transfusion without reported diagnosis    Yrs ago when she had anemia  . Cataract    bilateral repair  . Colon polyps   . Elevated cholesterol   . ESRD on hemodialysis ([place])   . Gastritis and gastroduodenitis 12/29/2014  . GERD (gastroesophageal reflux disease)   . Headache(784.0)   . Hx of adenomatous colonic polyps 01/04/2015  . Hyperlipemia   . Hypertension     Patient Active Problem List   Diagnosis Date Noted  . Hypertensive emergency 09/09/2018  . Acute respiratory failure with hypoxia ([place])   . General weakness 09/01/2016  . Symptomatic anemia 09/01/2016  . HTN (hypertension) 09/01/2016  . HLD (hyperlipidemia) 09/01/2016  . Hx of adenomatous colonic polyps 01/04/2015  . History of Gastritis and gastroduodenitis 12/29/2014  . CKD (chronic  kidney disease) stage V requiring chronic dialysis ([place]) 01/24/2012    Past Surgical History:  Procedure Laterality Date  . AV FISTULA PLACEMENT  02/02/2012   Procedure: ARTERIOVENOUS (AV) FISTULA CREATION;  Surgeon: Angelia Mould, MD;  Location: [facility] OR;  Service: Vascular;  Laterality: Left;  . AV FISTULA REPAIR     Angioplasty of fistula multiple times Left  . COLONOSCOPY  2016  . COLONOSCOPY WITH PROPOFOL N/A 12/29/2014   Procedure: COLONOSCOPY WITH PROPOFOL;  Surgeon: Gatha Mayer, MD;  Location: [facility] ENDOSCOPY;  Service: Endoscopy;  Laterality: N/A;  . ESOPHAGOGASTRODUODENOSCOPY (EGD) WITH PROPOFOL N/A 12/29/2014   Procedure: ESOPHAGOGASTRODUODENOSCOPY (EGD) WITH PROPOFOL;  Surgeon: Gatha Mayer, MD;  Location: [facility] ENDOSCOPY;  Service: Endoscopy;  Laterality: N/A;  . FISTULOGRAM N/A 04/01/2012   Procedure: FISTULOGRAM;  Surgeon: Angelia Mould, MD;  Location: [facility] CATH LAB;  Service: Cardiovascular;  Laterality: N/A;  . INSERTION OF DIALYSIS CATHETER  02/02/2012   Procedure: INSERTION OF DIALYSIS CATHETER;  Surgeon: Angelia Mould, MD;  Location: [place];  Service: Vascular;  Laterality: Right;  Insertion of 23cm dialysis catheter in Right Internal Jugular   . TUBAL LIGATION  1980     OB History   No obstetric history on file.     Family History  Problem Relation Age of Onset  . Hypertension Mother   . Cancer Sister  type unknown  . Cancer Brother        type unknown  . Hypertension Daughter   . Colon cancer Neg Hx   . Colon polyps Neg Hx   . Esophageal cancer Neg Hx   . Stomach cancer Neg Hx   . Rectal cancer Neg Hx     Social History   Tobacco Use  . Smoking status: Never Smoker  . Smokeless tobacco: Never Used  Vaping Use  . Vaping Use: Never used  Substance Use Topics  . Alcohol use: No  . Drug use: No    Home Medications Prior to Admission medications   Medication Sig Start Date End Date Taking? Authorizing Provider  acetaminophen  (TYLENOL) 500 MG tablet Take 500 mg by mouth every 6 (six) hours as needed for moderate pain or headache.   Yes [provider]  amLODipine (NORVASC) 10 MG tablet Take 1 tablet by mouth every evening. 10/14/14  Yes [provider]  atorvastatin (LIPITOR) 10 MG tablet Take 10 mg by mouth every evening.    Yes [provider]  cinacalcet (SENSIPAR) 60 MG tablet Take 60 mg by mouth at bedtime.   Yes [provider]  dexlansoprazole (DEXILANT) 60 MG capsule Take 60 mg by mouth daily.    Yes [provider]  hydrALAZINE (APRESOLINE) 25 MG tablet Take 25 mg by mouth in the morning and at bedtime.   Yes [provider]  lidocaine-prilocaine (EMLA) cream Apply 1 application topically See admin instructions. Apply a small amount to access site (AVF) 1 to 2 hours before dialysis. Cover with occlusive dressing (saran warp). 08/27/18  Yes [provider]  metoprolol tartrate (LOPRESSOR) 50 MG tablet Take 1 tablet (50 mg total) by mouth 2 (two) times daily. 09/13/18  Yes Meccariello, Bernita Raisin, DO  multivitamin (RENA-VIT) TABS tablet Take 1 tablet by mouth daily.   Yes [provider]  sevelamer carbonate (RENVELA) 800 MG tablet Take 3 tablets (2,400 mg total) by mouth 3 (three) times daily with meals. 09/13/18  Yes Meccariello, Bernita Raisin, DO  hydrALAZINE (APRESOLINE) 50 MG tablet Take 1 tablet (50 mg total) by mouth every 8 (eight) hours. Patient not taking: No sig reported 09/13/18   Meccariello, Bernita Raisin, DO    Allergies    Patient has no known allergies.  Review of Systems   Review of Systems  Gastrointestinal: Positive for abdominal pain and blood in stool.  All other systems reviewed and are negative.   Physical Exam Updated Vital Signs BP (!) 187/89   Pulse 72   Temp 97.6 F (36.4 C) (Oral)   Resp 18   Ht 5' (1.524 m)   SpO2 99%   BMI 31.25 kg/m   Physical Exam Vitals and nursing note reviewed.  Constitutional:       Appearance: She is well-developed.  HENT:     Head: Normocephalic.  Cardiovascular:     Rate and Rhythm: Normal rate.     Pulses: Normal pulses.  Pulmonary:     Effort: Pulmonary effort is normal.  Abdominal:     General: There is no distension.  Musculoskeletal:        General: Normal range of motion.     Cervical back: Normal range of motion.  Skin:    General: Skin is warm.  Neurological:     General: No focal deficit present.     Mental Status: She is alert and oriented to person, place, and time.  Psychiatric:  Mood and Affect: Mood normal.     ED Results / Procedures / Treatments   Labs (all labs ordered are listed, but only abnormal results are displayed) Labs Reviewed  COMPREHENSIVE METABOLIC PANEL - Abnormal; Notable for the following components:      Result Value   Potassium 6.1 (*)    Glucose, Bld 116 (*)    BUN 50 (*)    Creatinine, Ser 9.63 (*)    GFR, Estimated 4 (*)    All other components within normal limits  CBC WITH DIFFERENTIAL/PLATELET - Abnormal; Notable for the following components:   RBC 3.66 (*)    Hemoglobin 10.5 (*)    HCT 33.0 (*)    RDW 18.4 (*)    All other components within normal limits  PROTIME-INR  APTT  LACTIC ACID, PLASMA  POC OCCULT BLOOD, ED  TYPE AND SCREEN    EKG EKG Interpretation  Date/Time:  Monday September 06 2020 06:18:19 EDT Ventricular Rate:  72 PR Interval:  172 QRS Duration: 99 QT Interval:  394 QTC Calculation: 432 R Axis:   16 Text Interpretation: Sinus rhythm No significant change since last tracing Confirmed by Gareth Morgan [id number]) on 09/06/2020 6:58:04 AM   Radiology No results found.  Procedures Procedures   Medications Ordered in ED Medications  pantoprazole (PROTONIX) 80 mg in sodium chloride 0.9 % 100 mL IVPB (0 mg Intravenous Stopped 09/06/20 0851)    ED Course  I have reviewed the triage vital signs and the nursing notes.  Pertinent labs & imaging results that were available  during my care of the patient were reviewed by me and considered in my medical decision making (see chart for details).    MDM Rules/Calculators/A&P                          MDM:  I spoke to GI  [place] who will see, unassigned medicine consult for admission.  Internal medicine service will admit.  I spoke to Dr. Jonnie Finner Neph[facility] who will see for dialysis  Final Clinical Impression(s) / ED Diagnoses Final diagnoses:  Gastrointestinal hemorrhage associated with gastritis, unspecified gastritis type    Rx / DC Orders ED Discharge Orders    None       Sidney Ace 09/06/20 0940    Gareth Morgan, MD 09/07/20 [phone]

## 2020-09-06 NOTE — Progress Notes (Signed)
Patients most recent Hbg 7.2, with no signs or symptoms of continues bleeding at this time. Doctor asked to hold unit of [facility] for now.

## 2020-09-07 ENCOUNTER — Observation Stay (HOSPITAL_COMMUNITY): Payer: Medicare Other

## 2020-09-07 DIAGNOSIS — Z79899 Other long term (current) drug therapy: Secondary | ICD-10-CM | POA: Diagnosis not present

## 2020-09-07 DIAGNOSIS — E875 Hyperkalemia: Secondary | ICD-10-CM | POA: Diagnosis present

## 2020-09-07 DIAGNOSIS — K2951 Unspecified chronic gastritis with bleeding: Secondary | ICD-10-CM | POA: Diagnosis present

## 2020-09-07 DIAGNOSIS — D696 Thrombocytopenia, unspecified: Secondary | ICD-10-CM | POA: Diagnosis present

## 2020-09-07 DIAGNOSIS — E7800 Pure hypercholesterolemia, unspecified: Secondary | ICD-10-CM | POA: Diagnosis present

## 2020-09-07 DIAGNOSIS — K922 Gastrointestinal hemorrhage, unspecified: Secondary | ICD-10-CM | POA: Diagnosis present

## 2020-09-07 DIAGNOSIS — K5731 Diverticulosis of large intestine without perforation or abscess with bleeding: Secondary | ICD-10-CM | POA: Diagnosis present

## 2020-09-07 DIAGNOSIS — I120 Hypertensive chronic kidney disease with stage 5 chronic kidney disease or end stage renal disease: Secondary | ICD-10-CM

## 2020-09-07 DIAGNOSIS — E785 Hyperlipidemia, unspecified: Secondary | ICD-10-CM

## 2020-09-07 DIAGNOSIS — Z8719 Personal history of other diseases of the digestive system: Secondary | ICD-10-CM | POA: Diagnosis not present

## 2020-09-07 DIAGNOSIS — R9389 Abnormal findings on diagnostic imaging of other specified body structures: Secondary | ICD-10-CM | POA: Diagnosis present

## 2020-09-07 DIAGNOSIS — D62 Acute posthemorrhagic anemia: Secondary | ICD-10-CM | POA: Diagnosis present

## 2020-09-07 DIAGNOSIS — K219 Gastro-esophageal reflux disease without esophagitis: Secondary | ICD-10-CM | POA: Diagnosis present

## 2020-09-07 DIAGNOSIS — Z992 Dependence on renal dialysis: Secondary | ICD-10-CM | POA: Diagnosis not present

## 2020-09-07 DIAGNOSIS — K8020 Calculus of gallbladder without cholecystitis without obstruction: Secondary | ICD-10-CM | POA: Diagnosis present

## 2020-09-07 DIAGNOSIS — N186 End stage renal disease: Secondary | ICD-10-CM | POA: Diagnosis present

## 2020-09-07 DIAGNOSIS — Z809 Family history of malignant neoplasm, unspecified: Secondary | ICD-10-CM | POA: Diagnosis not present

## 2020-09-07 DIAGNOSIS — Z86010 Personal history of colonic polyps: Secondary | ICD-10-CM | POA: Diagnosis not present

## 2020-09-07 DIAGNOSIS — Z8249 Family history of ischemic heart disease and other diseases of the circulatory system: Secondary | ICD-10-CM | POA: Diagnosis not present

## 2020-09-07 DIAGNOSIS — I959 Hypotension, unspecified: Secondary | ICD-10-CM | POA: Diagnosis present

## 2020-09-07 LAB — CBC
HCT: 23.4 % — ABNORMAL LOW (ref 36.0–46.0)
HCT: 22.4 % — ABNORMAL LOW (ref 36.0–46.0)
Hemoglobin: 7.6 g/dL — ABNORMAL LOW (ref 12.0–15.0)
Hemoglobin: 7.5 g/dL — ABNORMAL LOW (ref 12.0–15.0)
MCH: 28.6 pg (ref 26.0–34.0)
MCH: 28.6 pg (ref 26.0–34.0)
MCHC: 32.5 g/dL (ref 30.0–36.0)
MCHC: 33.5 g/dL (ref 30.0–36.0)
MCV: 88 fL (ref 80.0–100.0)
MCV: 85.5 fL (ref 80.0–100.0)
Platelets: 148 10*3/uL — ABNORMAL LOW (ref 150–400)
Platelets: 132 10*3/uL — ABNORMAL LOW (ref 150–400)
RBC: 2.66 MIL/uL — ABNORMAL LOW (ref 3.87–5.11)
RBC: 2.62 MIL/uL — ABNORMAL LOW (ref 3.87–5.11)
RDW: 17.8 % — ABNORMAL HIGH (ref 11.5–15.5)
RDW: 17.6 % — ABNORMAL HIGH (ref 11.5–15.5)
WBC: 9 10*3/uL (ref 4.0–10.5)
WBC: 13.3 10*3/uL — ABNORMAL HIGH (ref 4.0–10.5)
nRBC: 0.2 % (ref 0.0–0.2)
nRBC: 0 % (ref 0.0–0.2)

## 2020-09-07 LAB — MRSA PCR SCREENING: MRSA by PCR: NEGATIVE

## 2020-09-07 LAB — BASIC METABOLIC PANEL
Anion gap: 12 (ref 5–15)
BUN: 58 mg/dL — ABNORMAL HIGH (ref 8–23)
CO2: 24 mmol/L (ref 22–32)
Calcium: 8.6 mg/dL — ABNORMAL LOW (ref 8.9–10.3)
Chloride: 100 mmol/L (ref 98–111)
Creatinine, Ser: 11.53 mg/dL — ABNORMAL HIGH (ref 0.44–1.00)
GFR, Estimated: 3 mL/min — ABNORMAL LOW (ref >60)
Glucose, Bld: 95 mg/dL (ref 70–99)
Potassium: 6.3 mmol/L (ref 3.5–5.1)
Sodium: 136 mmol/L (ref 135–145)

## 2020-09-07 LAB — HEMOGLOBIN AND HEMATOCRIT, BLOOD
HCT: 20.1 % — ABNORMAL LOW (ref 36.0–46.0)
Hemoglobin: 6.4 g/dL — CL (ref 12.0–15.0)

## 2020-09-07 LAB — HEPATITIS B SURFACE ANTIGEN: Hepatitis B Surface Ag: NONREACTIVE

## 2020-09-07 MED ORDER — IOHEXOL 350 MG/ML SOLN
80.0000 mL | Freq: Once | INTRAVENOUS | Status: AC | PRN
  Administered 2020-09-07: 80 mL via INTRAVENOUS

## 2020-09-07 MED ORDER — ALBUMIN HUMAN 25 % IV SOLN
INTRAVENOUS | Status: AC
  Administered 2020-09-07: 25 g via INTRAVENOUS
  Filled 2020-09-07: qty 100

## 2020-09-07 MED ORDER — ALBUMIN HUMAN 25 % IV SOLN: 25.0000 g | Freq: Once | INTRAVENOUS | Status: AC

## 2020-09-07 MED ORDER — CHLORHEXIDINE GLUCONATE CLOTH 2 % EX PADS
6.0000 | MEDICATED_PAD | Freq: Every day | CUTANEOUS | Status: DC
  Administered 2020-09-07 – 2020-09-09 (×3): 6 via TOPICAL

## 2020-09-07 NOTE — Progress Notes (Addendum)
[facility] Progress Note   Subjective:   S/p mesenteric angiogram with embolization on 4/18. Pt did not get HD yesterday due to need for emergency GI procedure. Reports she is still having some rectal bleeding today but abdominal pain is better. Primary complaint is that she is hungry and wants to eat, will defer to GI team. No SOB, CP, palpitations, dizziness, muscle twitching or weakness. K+ still elevated at 6.3- HD unit aware and pt will be prioritized for HD this AM.   Objective Vitals:   09/07/20 0241 09/07/20 0256 09/07/20 0357 09/07/20 0521  BP: (!) 158/80 (!) 150/70 (!) 158/80 (!) 148/72  Pulse: 74 74 74 73  Resp: '18 17 20 15  ' Temp: 98.7 F (37.1 C) 98.7 F (37.1 C) 98.4 F (36.9 C) 98.1 F (36.7 C)  TempSrc: Oral  Oral Oral  SpO2: 96% 96% 96% 95%  Height:       Physical Exam General: WDWN female, alert and in NAD Heart: RRR, no murmurs, rubs or gallops Lungs: CTA bilaterally without wheezing, rhonchi or rales Abdomen: Soft, non-distended, +BS Extremities: No edema b/l lower extremities Dialysis Access: LUE AVF +bruit  Additional Objective Labs: Basic Metabolic Panel: Recent Labs  Lab 09/06/20 0627 09/06/20 1754 09/06/20 2149 09/07/20 0411  NA 139  --   --  136  K 6.1* 6.3* 6.2* 6.3*  CL 102  --   --  100  CO2 22  --   --  24  GLUCOSE 116*  --   --  95  BUN 50*  --   --  58*  CREATININE 9.63*  --   --  11.53*  CALCIUM 9.2  --   --  8.6*   Liver Function Tests: Recent Labs  Lab 09/06/20 0627  AST 31  ALT 18  ALKPHOS 115  BILITOT 0.6  PROT 7.6  ALBUMIN 3.7   CBC: Recent Labs  Lab 09/06/20 0627 09/06/20 1754 09/06/20 1951 09/07/20 0142 09/07/20 0749  WBC 8.9 9.3  --   --  9.0  NEUTROABS 7.4  --   --   --   --   HGB 10.5* 6.9* 7.2* 6.4* 7.6*  HCT 33.0* 22.2* 22.6* 20.1* 23.4*  MCV 90.2 90.6  --   --  88.0  PLT 213 181  --   --  148*   Blood Culture    Component Value Date/Time   SDES BLOOD RIGHT HAND 09/09/2018 0136    SPECREQUEST  09/09/2018 0136    BOTTLES DRAWN AEROBIC AND ANAEROBIC Blood Culture results may not be optimal due to an inadequate volume of blood received in culture bottles   CULT  09/09/2018 0136    NO GROWTH 5 DAYS Performed at [facility], [address], [place], [place] [address]    REPTSTATUS 09/14/2018 FINAL 09/09/2018 0136   CBG: Recent Labs  Lab 09/06/20 1231  GLUCAP 119*   Studies/Results: IR Angiogram Visceral Selective  Result Date: 09/06/2020 INDICATION: 75 year old female with end-stage renal disease presenting with hematochezia and CT evidence of diverticulosis and acute hemorrhage from the ascending colon near the hepatic flexure. EXAM: 1. Ultrasound-guided vascular access of the right common femoral artery 2. Selective catheterization of the superior mesenteric artery 3. Selective catheterization of the right colic artery 4. Sub selective catheterization of distal branch of right colic artery. 5. Gel-Foam and coil embolization of distal branch of right colic artery. 6. Deployment of closure device in the right common femoral artery MEDICATIONS: None. ANESTHESIA/SEDATION:  Moderate (conscious) sedation was employed during this procedure. A total of Versed 0.5 mg and Fentanyl 50 mcg was administered intravenously. Moderate Sedation Time: 88 minutes. The patient's level of consciousness and vital signs were monitored continuously by radiology nursing throughout the procedure under my direct supervision. CONTRAST:  44m OMNIPAQUE IOHEXOL 300 MG/ML SOLN, 224mOMNIPAQUE IOHEXOL 300 MG/ML SOLN FLUOROSCOPY TIME:  Fluoroscopy Time: 22 minutes 0 seconds (500 mGy). COMPLICATIONS: None immediate. PROCEDURE: Informed consent was obtained from the patient following explanation of the procedure, risks, benefits and alternatives. The patient understands, agrees and consents for the procedure. All questions were addressed. A time out was performed prior to the initiation of the procedure.  Maximal barrier sterile technique utilized including caps, mask, sterile gowns, sterile gloves, large sterile drape, hand hygiene, and Betadine prep. The right groin was prepped and draped in standard fashion. Preprocedure ultrasound evaluation demonstrated widely patent right common femoral artery. The procedure was planned. Subdermal Local anesthesia was provided at the planned needle entry site. A small skin nick was made. Under direct ultrasound visualization, the right common femoral artery was accessed with a 21 gauge micropuncture needle. An image was captured and stored in a permanent record. The micropuncture sheath was inserted and limited right lower extremity angiogram was performed which demonstrated adequate puncture site for arterial closure device deployment. A J wire was inserted through the micropuncture sheath and directed to the level of the common iliac artery where some resistance with advancement was met. Therefore the 5 [place] cm vascular sheath was placed. A Kumpe the catheter was then used to help guide the J wire to the distal abdominal aorta. The Kumpe catheter was exchanged for a 5 French C2 and under direct fluoroscopic guidance the superior mesenteric artery was selected. Superior mesenteric angiogram was then performed. The superior mesenteric artery is widely patent. A focus of active extravasation was visualized arising from a distal branch of the right colic artery. A straight Lantern microcatheter and 0.014 inch soft synchro wire were then inserted and directed into the proximal right colic artery. Repeat angiogram was performed which demonstrated patent right colic artery with similar appearing focal active extravasation on the antimesenteric aspect of the distal ascending colon. Sub selective catheterization was attempted, however the lantern microcatheter would not track along the tortuous right colic artery. Therefore, the catheter was removed and a 2.0 [place]  alpha microcatheter and 0.014 inch soft synchro wire were directed to the distal right colic artery. 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 Given the anti mesenteric aspect of the hemorrhage, 0.5 mL of dilute Gel-Foam slurry were slowly administered under fluoroscopic guidance. Next, a total of 3 microcoils were deployed in the distal branch of the right colic artery. The catheter was slightly retracted into the mid right colic artery and repeat, completion angiogram was performed. Completion angiogram demonstrated adequate pruning of a small segment of the distal ascending colon with resolution of previously visualized active extravasation. The microcatheter and 5 French base catheter removed. The arteriotomy was closed with a 6 French Angio-Seal device without complication. The patient tolerated the procedure well was transferred to the floor in stable condition. IMPRESSION: 1. 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 2. Successful Gel-Foam and coil embolization a terminal common distal branch of the right colic artery. DyRuthann CancerMD [facility]  Electronically Signed   By: Ruthann Cancer MD   On: 09/06/2020 16:49   IR Angiogram Selective Each Additional Vessel  Result Date: 09/06/2020 INDICATION: 75 year old female with end-stage renal disease presenting with hematochezia and CT evidence of diverticulosis and acute hemorrhage from the ascending colon near the hepatic flexure. EXAM: 1. Ultrasound-guided vascular access of the right common femoral artery 2. Selective catheterization of the superior mesenteric artery 3. Selective catheterization of the right colic artery 4. Sub selective catheterization of distal branch of  right colic artery. 5. Gel-Foam and coil embolization of distal branch of right colic artery. 6. Deployment of closure device in the right common femoral artery MEDICATIONS: None. ANESTHESIA/SEDATION: Moderate (conscious) sedation was employed during this procedure. A total of Versed 0.5 mg and Fentanyl 50 mcg was administered intravenously. Moderate Sedation Time: 88 minutes. The patient's level of consciousness and vital signs were monitored continuously by radiology nursing throughout the procedure under my direct supervision. CONTRAST:  33m OMNIPAQUE IOHEXOL 300 MG/ML SOLN, 269mOMNIPAQUE IOHEXOL 300 MG/ML SOLN FLUOROSCOPY TIME:  Fluoroscopy Time: 22 minutes 0 seconds (500 mGy). COMPLICATIONS: None immediate. PROCEDURE: Informed consent was obtained from the patient following explanation of the procedure, risks, benefits and alternatives. The patient understands, agrees and consents for the procedure. All questions were addressed. A time out was performed prior to the initiation of the procedure. Maximal barrier sterile technique utilized including caps, mask, sterile gowns, sterile gloves, large sterile drape, hand hygiene, and Betadine prep. The right groin was prepped and draped in standard fashion. Preprocedure ultrasound evaluation demonstrated widely patent right common femoral artery. The procedure was planned. Subdermal Local anesthesia was provided at the planned needle entry site. A small skin nick was made. Under direct ultrasound visualization, the right common femoral artery was accessed with a 21 gauge micropuncture needle. An image was captured and stored in a permanent record. The micropuncture sheath was inserted and limited right lower extremity angiogram was performed which demonstrated adequate puncture site for arterial closure device deployment. A J wire was inserted through the micropuncture sheath and directed to the level of the common iliac artery where some resistance with advancement  was met. Therefore the 5 [place] cm vascular sheath was placed. A Kumpe the catheter was then used to help guide the J wire to the distal abdominal aorta. The Kumpe catheter was exchanged for a 5 French C2 and under direct fluoroscopic guidance the superior mesenteric artery was selected. Superior mesenteric angiogram was then performed. The superior mesenteric artery is widely patent. A focus of active extravasation was visualized arising from a distal branch of the right colic artery. A straight Lantern microcatheter and 0.014 inch soft synchro wire were then inserted and directed into the proximal right colic artery. Repeat angiogram was performed which demonstrated patent right colic artery with similar appearing focal active extravasation on the antimesenteric aspect of the distal ascending colon. Sub selective catheterization was attempted, however the lantern microcatheter would not track along the tortuous right colic artery. Therefore, the catheter was removed and a 2.0 Fr[place] alpha microcatheter and 0.014 inch soft synchro wire were directed to the distal right colic artery. 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 Given the anti mesenteric aspect of the hemorrhage, 0.5 mL of dilute Gel-Foam slurry were slowly administered under fluoroscopic guidance. Next, a total of 3 microcoils were deployed in the distal branch of the right colic artery. The catheter was  slightly retracted into the mid right colic artery and repeat, completion angiogram was performed. Completion angiogram demonstrated adequate pruning of a small segment of the distal ascending colon with resolution of previously visualized active extravasation. The microcatheter and 5 French base catheter removed. The arteriotomy was closed with a 6 French Angio-Seal device without complication. The patient tolerated  the procedure well was transferred to the floor in stable condition. IMPRESSION: 1. 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 2. Successful Gel-Foam and coil embolization a terminal common distal branch of the right colic artery. Ruthann Cancer, MD [facility] Radiology Electronically Signed   By: Ruthann Cancer MD   On: 09/06/2020 16:49   IR Angiogram Selective Each Additional Vessel  Result Date: 09/06/2020 INDICATION: 75 year old female with end-stage renal disease presenting with hematochezia and CT evidence of diverticulosis and acute hemorrhage from the ascending colon near the hepatic flexure. EXAM: 1. Ultrasound-guided vascular access of the right common femoral artery 2. Selective catheterization of the superior mesenteric artery 3. Selective catheterization of the right colic artery 4. Sub selective catheterization of distal branch of right colic artery. 5. Gel-Foam and coil embolization of distal branch of right colic artery. 6. Deployment of closure device in the right common femoral artery MEDICATIONS: None. ANESTHESIA/SEDATION: Moderate (conscious) sedation was employed during this procedure. A total of Versed 0.5 mg and Fentanyl 50 mcg was administered intravenously. Moderate Sedation Time: 88 minutes. The patient's level of consciousness and vital signs were monitored continuously by radiology nursing throughout the procedure under my direct supervision. CONTRAST:  22m OMNIPAQUE IOHEXOL 300 MG/ML SOLN, 296mOMNIPAQUE IOHEXOL 300 MG/ML SOLN FLUOROSCOPY TIME:  Fluoroscopy Time: 22 minutes 0 seconds (500 mGy). COMPLICATIONS: None immediate. PROCEDURE: Informed consent was obtained from the patient following explanation of the procedure, risks, benefits and alternatives. The patient understands, agrees and consents for the procedure. All questions  were addressed. A time out was performed prior to the initiation of the procedure. Maximal barrier sterile technique utilized including caps, mask, sterile gowns, sterile gloves, large sterile drape, hand hygiene, and Betadine prep. The right groin was prepped and draped in standard fashion. Preprocedure ultrasound evaluation demonstrated widely patent right common femoral artery. The procedure was planned. Subdermal Local anesthesia was provided at the planned needle entry site. A small skin nick was made. Under direct ultrasound visualization, the right common femoral artery was accessed with a 21 gauge micropuncture needle. An image was captured and stored in a permanent record. The micropuncture sheath was inserted and limited right lower extremity angiogram was performed which demonstrated adequate puncture site for arterial closure device deployment. A J wire was inserted through the micropuncture sheath and directed to the level of the common iliac artery where some resistance with advancement was met. Therefore the 5 [place]11 cm vascular sheath was placed. A Kumpe the catheter was then used to help guide the J wire to the distal abdominal aorta. The Kumpe catheter was exchanged for a 5 French C2 and under direct fluoroscopic guidance the superior mesenteric artery was selected. Superior mesenteric angiogram was then performed. The superior mesenteric artery is widely patent. A focus of active extravasation was visualized arising from a distal branch of the right colic artery. A straight Lantern microcatheter and 0.014 inch soft synchro wire were then inserted and directed into the proximal right colic artery. Repeat angiogram was performed which demonstrated patent right colic artery with similar  appearing focal active extravasation on the antimesenteric aspect of the distal ascending colon. Sub selective catheterization was attempted, however the lantern microcatheter would not track along the tortuous  right colic artery. Therefore, the catheter was removed and a 2.0 [place] Progreat alpha microcatheter and 0.014 inch soft synchro wire were directed to the distal right colic artery. 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 Given the anti mesenteric aspect of the hemorrhage, 0.5 mL of dilute Gel-Foam slurry were slowly administered under fluoroscopic guidance. Next, a total of 3 microcoils were deployed in the distal branch of the right colic artery. The catheter was slightly retracted into the mid right colic artery and repeat, completion angiogram was performed. Completion angiogram demonstrated adequate pruning of a small segment of the distal ascending colon with resolution of previously visualized active extravasation. The microcatheter and 5 French base catheter removed. The arteriotomy was closed with a 6 French Angio-Seal device without complication. The patient tolerated the procedure well was transferred to the floor in stable condition. IMPRESSION: 1. 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 2. Successful Gel-Foam and coil embolization a terminal common distal branch of the right colic artery. Ruthann Cancer, MD [facility]iology Electronically Signed   By: Ruthann Cancer MD   On: 09/06/2020 16:49   IR Angiogram Selective Each Additional Vessel  Result Date: 09/06/2020 INDICATION: 75 year old female with end-stage renal disease presenting with hematochezia and CT evidence of diverticulosis and acute hemorrhage from the ascending colon near the hepatic flexure. EXAM: 1. Ultrasound-guided vascular access of the right common femoral artery 2. Selective catheterization of the superior mesenteric artery 3. Selective  catheterization of the right colic artery 4. Sub selective catheterization of distal branch of right colic artery. 5. Gel-Foam and coil embolization of distal branch of right colic artery. 6. Deployment of closure device in the right common femoral artery MEDICATIONS: None. ANESTHESIA/SEDATION: Moderate (conscious) sedation was employed during this procedure. A total of Versed 0.5 mg and Fentanyl 50 mcg was administered intravenously. Moderate Sedation Time: 88 minutes. The patient's level of consciousness and vital signs were monitored continuously by radiology nursing throughout the procedure under my direct supervision. CONTRAST:  20m OMNIPAQUE IOHEXOL 300 MG/ML SOLN, 259mOMNIPAQUE IOHEXOL 300 MG/ML SOLN FLUOROSCOPY TIME:  Fluoroscopy Time: 22 minutes 0 seconds (500 mGy). COMPLICATIONS: None immediate. PROCEDURE: Informed consent was obtained from the patient following explanation of the procedure, risks, benefits and alternatives. The patient understands, agrees and consents for the procedure. All questions were addressed. A time out was performed prior to the initiation of the procedure. Maximal barrier sterile technique utilized including caps, mask, sterile gowns, sterile gloves, large sterile drape, hand hygiene, and Betadine prep. The right groin was prepped and draped in standard fashion. Preprocedure ultrasound evaluation demonstrated widely patent right common femoral artery. The procedure was planned. Subdermal Local anesthesia was provided at the planned needle entry site. A small skin nick was made. Under direct ultrasound visualization, the right common femoral artery was accessed with a 21 gauge micropuncture needle. An image was captured and stored in a permanent record. The micropuncture sheath was inserted and limited right lower extremity angiogram was performed which demonstrated adequate puncture site for arterial closure device deployment. A J wire was inserted through the micropuncture  sheath and directed to the level of the common iliac artery where some  resistance with advancement was met. Therefore the 5 [place], 11 cm vascular sheath was placed. A Kumpe the catheter was then used to help guide the J wire to the distal abdominal aorta. The Kumpe catheter was exchanged for a 5 French C2 and under direct fluoroscopic guidance the superior mesenteric artery was selected. Superior mesenteric angiogram was then performed. The superior mesenteric artery is widely patent. A focus of active extravasation was visualized arising from a distal branch of the right colic artery. A straight Lantern microcatheter and 0.014 inch soft synchro wire were then inserted and directed into the proximal right colic artery. Repeat angiogram was performed which demonstrated patent right colic artery with similar appearing focal active extravasation on the antimesenteric aspect of the distal ascending colon. Sub selective catheterization was attempted, however the lantern microcatheter would not track along the tortuous right colic artery. Therefore, the catheter was removed and a 2.0 [place] Progreat alpha microcatheter and 0.014 inch soft synchro wire were directed to the distal right colic artery. 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 Given the anti mesenteric aspect of the hemorrhage, 0.5 mL of dilute Gel-Foam slurry were slowly administered under fluoroscopic guidance. Next, a total of 3 microcoils were deployed in the distal branch of the right colic artery. The catheter was slightly retracted into the mid right colic artery and repeat, completion angiogram was performed. Completion angiogram demonstrated adequate pruning of a small segment of the distal ascending colon with resolution of previously visualized active extravasation. The microcatheter and 5 French base catheter removed. The  arteriotomy was closed with a 6 French Angio-Seal device without complication. The patient tolerated the procedure well was transferred to the floor in stable condition. IMPRESSION: 1. 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 2. Successful Gel-Foam and coil embolization a terminal common distal branch of the right colic artery. Ruthann Cancer, MD [facility] Radiology Electronically Signed   By: Ruthann Cancer MD   On: 09/06/2020 16:49   IR US Guide Vasc Access Right  Result Date: 09/06/2020 INDICATION: 75 year old female with end-stage renal disease presenting with hematochezia and CT evidence of diverticulosis and acute hemorrhage from the ascending colon near the hepatic flexure. EXAM: 1. Ultrasound-guided vascular access of the right common femoral artery 2. Selective catheterization of the superior mesenteric artery 3. Selective catheterization of the right colic artery 4. Sub selective catheterization of distal branch of right colic artery. 5. Gel-Foam and coil embolization of distal branch of right colic artery. 6. Deployment of closure device in the right common femoral artery MEDICATIONS: None. ANESTHESIA/SEDATION: Moderate (conscious) sedation was employed during this procedure. A total of Versed 0.5 mg and Fentanyl 50 mcg was administered intravenously. Moderate Sedation Time: 88 minutes. The patient's level of consciousness and vital signs were monitored continuously by radiology nursing throughout the procedure under my direct supervision. CONTRAST:  35m OMNIPAQUE IOHEXOL 300 MG/ML SOLN, 233mOMNIPAQUE IOHEXOL 300 MG/ML SOLN FLUOROSCOPY TIME:  Fluoroscopy Time: 22 minutes 0 seconds (500 mGy). COMPLICATIONS: None immediate. PROCEDURE: Informed consent was obtained from the patient following explanation of the procedure, risks, benefits and  alternatives. The patient understands, agrees and consents for the procedure. All questions were addressed. A time out was performed prior to the initiation of the procedure. Maximal barrier sterile technique utilized including caps, mask, sterile gowns, sterile gloves, large sterile drape, hand  hygiene, and Betadine prep. The right groin was prepped and draped in standard fashion. Preprocedure ultrasound evaluation demonstrated widely patent right common femoral artery. The procedure was planned. Subdermal Local anesthesia was provided at the planned needle entry site. A small skin nick was made. Under direct ultrasound visualization, the right common femoral artery was accessed with a 21 gauge micropuncture needle. An image was captured and stored in a permanent record. The micropuncture sheath was inserted and limited right lower extremity angiogram was performed which demonstrated adequate puncture site for arterial closure device deployment. A J wire was inserted through the micropuncture sheath and directed to the level of the common iliac artery where some resistance with advancement was met. Therefore the 5 [place], 11 cm vascular sheath was placed. A Kumpe the catheter was then used to help guide the J wire to the distal abdominal aorta. The Kumpe catheter was exchanged for a 5 French C2 and under direct fluoroscopic guidance the superior mesenteric artery was selected. Superior mesenteric angiogram was then performed. The superior mesenteric artery is widely patent. A focus of active extravasation was visualized arising from a distal branch of the right colic artery. A straight Lantern microcatheter and 0.014 inch soft synchro wire were then inserted and directed into the proximal right colic artery. Repeat angiogram was performed which demonstrated patent right colic artery with similar appearing focal active extravasation on the antimesenteric aspect of the distal ascending colon. Sub selective  catheterization was attempted, however the lantern microcatheter would not track along the tortuous right colic artery. Therefore, the catheter was removed and a 2.0 [place] Progreat alpha microcatheter and 0.014 inch soft synchro wire were directed to the distal right colic artery. 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 Given the anti mesenteric aspect of the hemorrhage, 0.5 mL of dilute Gel-Foam slurry were slowly administered under fluoroscopic guidance. Next, a total of 3 microcoils were deployed in the distal branch of the right colic artery. The catheter was slightly retracted into the mid right colic artery and repeat, completion angiogram was performed. Completion angiogram demonstrated adequate pruning of a small segment of the distal ascending colon with resolution of previously visualized active extravasation. The microcatheter and 5 French base catheter removed. The arteriotomy was closed with a 6 French Angio-Seal device without complication. The patient tolerated the procedure well was transferred to the floor in stable condition. IMPRESSION: 1. 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 2. Successful Gel-Foam and coil embolization a terminal common distal branch of the right colic artery. Ruthann Cancer, MD [facility] Radiology Electronically Signed   By: Ruthann Cancer MD   On: 09/06/2020 16:49   IR EMBO ART  VEN HEMORR LYMPH EXTRAV  INC GUIDE ROADMAPPING  Result Date: 09/06/2020 INDICATION: 75 year old female with end-stage renal disease presenting with hematochezia and CT evidence of diverticulosis and acute hemorrhage from the ascending colon near the hepatic flexure. EXAM: 1. Ultrasound-guided vascular access of the  right common femoral artery 2. Selective catheterization of the superior mesenteric artery 3. Selective catheterization of the right colic artery 4. Sub selective catheterization of distal branch of right colic artery. 5. Gel-Foam and coil embolization of distal branch of right colic artery. 6. Deployment of closure device in the right common femoral artery MEDICATIONS: None. ANESTHESIA/SEDATION: Moderate (conscious) sedation was employed during this procedure. A total of  Versed 0.5 mg and Fentanyl 50 mcg was administered intravenously. Moderate Sedation Time: 88 minutes. The patient's level of consciousness and vital signs were monitored continuously by radiology nursing throughout the procedure under my direct supervision. CONTRAST:  4m OMNIPAQUE IOHEXOL 300 MG/ML SOLN, 260mOMNIPAQUE IOHEXOL 300 MG/ML SOLN FLUOROSCOPY TIME:  Fluoroscopy Time: 22 minutes 0 seconds (500 mGy). COMPLICATIONS: None immediate. PROCEDURE: Informed consent was obtained from the patient following explanation of the procedure, risks, benefits and alternatives. The patient understands, agrees and consents for the procedure. All questions were addressed. A time out was performed prior to the initiation of the procedure. Maximal barrier sterile technique utilized including caps, mask, sterile gowns, sterile gloves, large sterile drape, hand hygiene, and Betadine prep. The right groin was prepped and draped in standard fashion. Preprocedure ultrasound evaluation demonstrated widely patent right common femoral artery. The procedure was planned. Subdermal Local anesthesia was provided at the planned needle entry site. A small skin nick was made. Under direct ultrasound visualization, the right common femoral artery was accessed with a 21 gauge micropuncture needle. An image was captured and stored in a permanent record. The micropuncture sheath was inserted and limited right lower extremity angiogram was performed which demonstrated adequate  puncture site for arterial closure device deployment. A J wire was inserted through the micropuncture sheath and directed to the level of the common iliac artery where some resistance with advancement was met. Therefore the 5 [place] cm vascular sheath was placed. A Kumpe the catheter was then used to help guide the J wire to the distal abdominal aorta. The Kumpe catheter was exchanged for a 5 French C2 and under direct fluoroscopic guidance the superior mesenteric artery was selected. Superior mesenteric angiogram was then performed. The superior mesenteric artery is widely patent. A focus of active extravasation was visualized arising from a distal branch of the right colic artery. A straight Lantern microcatheter and 0.014 inch soft synchro wire were then inserted and directed into the proximal right colic artery. Repeat angiogram was performed which demonstrated patent right colic artery with similar appearing focal active extravasation on the antimesenteric aspect of the distal ascending colon. Sub selective catheterization was attempted, however the lantern microcatheter would not track along the tortuous right colic artery. Therefore, the catheter was removed and a 2.0 [place] alpha microcatheter and 0.014 inch soft synchro wire were directed to the distal right colic artery. 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 Given the anti mesenteric aspect of the hemorrhage, 0.5 mL of dilute Gel-Foam slurry were slowly administered under fluoroscopic guidance. Next, a total of 3 microcoils were deployed in the distal branch of the right colic artery. The catheter was slightly retracted into the mid right colic artery and repeat, completion angiogram was performed. Completion angiogram demonstrated adequate pruning of a small segment of the distal ascending colon with resolution of  previously visualized active extravasation. The microcatheter and 5 French base catheter removed. The arteriotomy was closed with a 6 French Angio-Seal device without complication. The patient tolerated the procedure well was transferred to the floor in stable condition. IMPRESSION: 1. 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 2. Successful Gel-Foam and coil embolization a terminal common distal branch of the right colic artery. DyRuthann CancerMD [facility] Electronically Signed   By: DyRuthann CancerD  On: 09/06/2020 16:49   CT Angio Abd/Pel W and/or Wo Contrast  Result Date: 09/07/2020 CLINICAL DATA:  GI bleeding. EXAM: CTA ABDOMEN AND PELVIS WITHOUT AND WITH CONTRAST TECHNIQUE: Multidetector CT imaging of the abdomen and pelvis was performed using the standard protocol during bolus administration of intravenous contrast. Multiplanar reconstructed images and MIPs were obtained and reviewed to evaluate the vascular anatomy. CONTRAST:  8m OMNIPAQUE IOHEXOL 350 MG/ML SOLN COMPARISON:  CTA from yesterday FINDINGS: VASCULAR Aorta: No acute finding.  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 SMA: Approximately 50% atheromatous stenosis at the proximal SMA. Right colic branch embolization since prior. No active intraluminal hemorrhage or dissection seen. There is high-density within the lumen of the proximal colon which is also present on precontrast imaging and also seen in the additional small bowel loops Renals: Right more than left generalized narrow appearance in the setting of end-stage renal disease. IMA: Patent Inflow: Atheromatous iliac plaque bilaterally.  No dissection. Proximal Outflow: Unremarkable Veins: Unremarkable in the arterial phase  Review of the MIP images confirms the above findings. NON-VASCULAR Lower chest:  No contributory findings. Hepatobiliary: No focal liver abnormality.Cholelithiasis. The gallbladder is full but there is no evidence of acute wall inflammation. Pancreas: Unremarkable. Spleen: Unremarkable. Adrenals/Urinary Tract: Negative adrenals. Severe bilateral renal atrophy in the setting of end-stage renal disease. Bilateral renal cortical cystic densities. Unremarkable collapsed bladder. Stomach/Bowel: No obstruction. Multiple colonic diverticula. No recurrent intraluminal hemorrhage is noted above. Mild pericolonic fat haziness but the embolized segment enhances similar to the remaining colon on the portal venous phase. Vascular/Lymphatic: No acute vascular abnormality. No mass or adenopathy. Reproductive:7 mm generalized endometrial thickness. Other: No ascites or pneumoperitoneum. Musculoskeletal: No acute abnormalities. Spinal degeneration and scoliosis. IMPRESSION: 1. No active intraluminal hemorrhage. No new or acute vascular finding. 2. Cholelithiasis. 3. 7 mm endometrial stripe, unexpected for age. Recommend PCP follow-up. Electronically Signed   By: JMonte FantasiaM.D.   On: 09/07/2020 04:12   CT Angio Abd/Pel W and/or Wo Contrast  Result Date: 09/06/2020 CLINICAL DATA:  GI bleed, bloody diarrhea and abdominal pain. EXAM: CT ANGIOGRAPHY ABDOMEN AND PELVIS WITH CONTRAST AND WITHOUT CONTRAST TECHNIQUE: Multidetector CT imaging of the abdomen and pelvis was performed using the standard protocol during bolus administration of intravenous contrast. Multiplanar reconstructed images and MIPs were obtained and reviewed to evaluate the vascular anatomy. CONTRAST:  876mOMNIPAQUE IOHEXOL 350 MG/ML SOLN COMPARISON:  CT of the abdomen and pelvis on 03/06/2015 FINDINGS: VASCULAR Aorta: Aortic atherosclerosis with calcified plaque present. No evidence of abdominal aortic aneurysm. Celiac: Approximately 50-60% stenosis at the  origin of the celiac axis. Distal branch vessels are patent and demonstrate normal branching anatomy. SMA: Roughly 50% stenosis at the origin of the SMA. SMA trunk and branch vessels are normally patent. See below for description of colonic bleed. Renals: Small caliber and patent bilateral renal arteries. IMA: The IMA is patent. Inflow: Iliac atherosclerosis without evidence of significant obstructive disease or aneurysmal disease bilaterally. Proximal Outflow: Normally patent bilateral common femoral arteries and femoral bifurcations. Veins: Venous phase imaging demonstrates no venous abnormalities in the abdomen or pelvis. Review of the MIP images confirms the above findings. NON-VASCULAR Lower chest: No acute abnormality. Hepatobiliary: No focal liver abnormality is seen. Small calcified gallstones present. No gallbladder wall thickening, or biliary dilatation. Pancreas: Unremarkable. No pancreatic ductal dilatation or surrounding inflammatory changes. Spleen: Normal in size without focal abnormality. Adrenals/Urinary Tract: No adrenal  masses. Atrophic kidneys consistent with known end-stage renal disease. Stomach/Bowel: Positive for acute bleeding from the level of a right-sided colonic diverticulum near the hepatic flexure on the arterial phase of imaging into the colonic lumen with further accumulation of extravasated contrast on the venous phase of imaging. No other source of GI bleed identified on the CTA. Diverticulosis of the colon is primarily right-sided and extending into the transverse colon. No evidence of bowel obstruction, focal visible bowel lesion or free intraperitoneal air. Lymphatic: No enlarged abdominal or pelvic lymph nodes. Reproductive: Uterus and bilateral adnexa are unremarkable. Other: No abdominal wall hernia or abnormality. No abdominopelvic ascites. Musculoskeletal: No acute or significant osseous findings. IMPRESSION: 1. Positive for acute bleeding from the level of a right-sided  colonic diverticulum near the hepatic flexure with further accumulation of extravasated contrast on the venous phase of imaging. No other source of GI bleed identified on the CTA. 2. 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 3. Atrophic kidneys consistent with known end-stage renal disease. 4. Cholelithiasis. 5. These results were called by telephone at the time of interpretation on 09/06/2020 at 1055 hrs to provider Marcene Brawn, PA-C, who verbally acknowledged these results. Electronically Signed   By: Aletta Edouard M.D.   On: 09/06/2020 11:01   Medications:  . albuterol  10 mg Nebulization Once  . amLODipine  10 mg Oral QPM  . atorvastatin  10 mg Oral QPM  . cinacalcet  60 mg Oral QHS  . hydrALAZINE  25 mg Oral BID  . metoprolol tartrate  50 mg Oral BID  . pantoprazole  40 mg Oral Daily  . sevelamer carbonate  2,400 mg Oral TID WC    Dialysis Orders: MWF HD  3h 11mn  400/500  68.5kg  2/2 bath  P2  15ga LUA AVF  Hep none  - mircera q2wks last 4/11  - venofer 50 q wk  - hect 5 ug tiw  Assessment/Plan: 1. BRBPR - started 4/17-4/18, CTA showed active bleeding and patient went for IR embolization yesterday. Hemoglobin dropped from baseline ~10 to 6-7 overnight. Reports she still has some residual blood in stools. Management per GI.  2. ESRD - on HD MWF. K+ elevated, received temporizing measures yesterday but did not have HD due to need for emergent GI procedure. HD unit aware to make patient a priority for dialysis this AM. Have not given lokelma/veltassa due to GI side effects.  No heparin w/ HD.  3. HTN - BP elevated, does not appear volume overloaded on exam. Continue home BP meds and UF with HD as tolerated.  4. MBD ckd - cont vdra, binder and sensipar once tolerating PO 5. Anemia ckd - Worsening anemia due to #1 above. Next esa due on 4/25 6. HL - per pmd   SAnice Paganini PA-C 09/07/2020, 9:22 AM  [facility] Pager: [phone] Pt seen, examined and agree w A/P as above.  RKelly Splinter MD 09/07/2020, 12:20 PM

## 2020-09-07 NOTE — Progress Notes (Signed)
Pt. bp low as documented and feeling bad. Anitra Lauth, PA aware saw pt. At bedside. Orders for no UF for remainder of tx. Give albumin as ordered. Pt stable

## 2020-09-07 NOTE — Progress Notes (Signed)
Referring Physician(s): Azucena Freed, PA-C  Supervising Physician: Ruthann Cancer  Patient Status:  [facility] - In-pt  Chief Complaint: Follow up gel foam and coil embolization of distal branch of the right colic artery 7/82 in IR.  Subjective:  Laying in bed initially complaining of fatigue and some abdominal pain, asking to eat. When told she has to stay NPO for now patient states she actually feels fine and just wants to eat. She reports dark bowel movement but no bright red blood. No complaints regarding right CFA puncture site.   Allergies: Patient has no known allergies.  Medications: Prior to Admission medications   Medication Sig Start Date End Date Taking? Authorizing Provider  acetaminophen (TYLENOL) 500 MG tablet Take 500 mg by mouth every 6 (six) hours as needed for moderate pain or headache.   Yes [provider]  amLODipine (NORVASC) 10 MG tablet Take 1 tablet by mouth every evening. 10/14/14  Yes [provider]  atorvastatin (LIPITOR) 10 MG tablet Take 10 mg by mouth every evening.    Yes [provider]  cinacalcet (SENSIPAR) 60 MG tablet Take 60 mg by mouth at bedtime.   Yes [provider]  dexlansoprazole (DEXILANT) 60 MG capsule Take 60 mg by mouth daily.    Yes [provider]  hydrALAZINE (APRESOLINE) 25 MG tablet Take 25 mg by mouth in the morning and at bedtime.   Yes [provider]  lidocaine-prilocaine (EMLA) cream Apply 1 application topically See admin instructions. Apply a small amount to access site (AVF) 1 to 2 hours before dialysis. Cover with occlusive dressing (saran warp). 08/27/18  Yes [provider]  metoprolol tartrate (LOPRESSOR) 50 MG tablet Take 1 tablet (50 mg total) by mouth 2 (two) times daily. 09/13/18  Yes Meccariello, Bernita Raisin, DO  multivitamin (RENA-VIT) TABS tablet Take 1 tablet by mouth daily.   Yes [provider]  sevelamer carbonate (RENVELA) 800 MG tablet Take 3  tablets (2,400 mg total) by mouth 3 (three) times daily with meals. 09/13/18  Yes Meccariello, Bernita Raisin, DO  hydrALAZINE (APRESOLINE) 50 MG tablet Take 1 tablet (50 mg total) by mouth every 8 (eight) hours. Patient not taking: No sig reported 09/13/18   Meccariello, Bernita Raisin, DO     Vital Signs: BP (!) 148/72 (BP Location: Right Wrist)   Pulse 73   Temp 98.1 F (36.7 C) (Oral)   Resp 15   Ht 5' (1.524 m)   SpO2 95%   BMI 31.25 kg/m   Physical Exam Vitals and nursing note reviewed.  Constitutional:      General: She is not in acute distress. HENT:     Head: Normocephalic.  Cardiovascular:     Rate and Rhythm: Normal rate and regular rhythm.     Comments: (+) right CFA puncture site clean, dry, dressed appropriately. No active bleeding noted. Soft, non tender, non pulsatile. Pulmonary:     Effort: Pulmonary effort is normal.     Breath sounds: Normal breath sounds.  Abdominal:     General: There is no distension.     Palpations: Abdomen is soft.     Tenderness: There is no abdominal tenderness.  Skin:    General: Skin is warm and dry.  Neurological:     Mental Status: She is alert. Mental status is at baseline.     Imaging: IR Angiogram Visceral Selective  Result Date: 09/06/2020 INDICATION: 75 year old female with end-stage renal disease presenting with hematochezia and CT evidence of  diverticulosis and acute hemorrhage from the ascending colon near the hepatic flexure. EXAM: 1. Ultrasound-guided vascular access of the right common femoral artery 2. Selective catheterization of the superior mesenteric artery 3. Selective catheterization of the right colic artery 4. Sub selective catheterization of distal branch of right colic artery. 5. Gel-Foam and coil embolization of distal branch of right colic artery. 6. Deployment of closure device in the right common femoral artery MEDICATIONS: None. ANESTHESIA/SEDATION: Moderate (conscious) sedation was employed during this procedure.  A total of Versed 0.5 mg and Fentanyl 50 mcg was administered intravenously. Moderate Sedation Time: 88 minutes. The patient's level of consciousness and vital signs were monitored continuously by radiology nursing throughout the procedure under my direct supervision. CONTRAST:  9m OMNIPAQUE IOHEXOL 300 MG/ML SOLN, 248mOMNIPAQUE IOHEXOL 300 MG/ML SOLN FLUOROSCOPY TIME:  Fluoroscopy Time: 22 minutes 0 seconds (500 mGy). COMPLICATIONS: None immediate. PROCEDURE: Informed consent was obtained from the patient following explanation of the procedure, risks, benefits and alternatives. The patient understands, agrees and consents for the procedure. All questions were addressed. A time out was performed prior to the initiation of the procedure. Maximal barrier sterile technique utilized including caps, mask, sterile gowns, sterile gloves, large sterile drape, hand hygiene, and Betadine prep. The right groin was prepped and draped in standard fashion. Preprocedure ultrasound evaluation demonstrated widely patent right common femoral artery. The procedure was planned. Subdermal Local anesthesia was provided at the planned needle entry site. A small skin nick was made. Under direct ultrasound visualization, the right common femoral artery was accessed with a 21 gauge micropuncture needle. An image was captured and stored in a permanent record. The micropuncture sheath was inserted and limited right lower extremity angiogram was performed which demonstrated adequate puncture site for arterial closure device deployment. A J wire was inserted through the micropuncture sheath and directed to the level of the common iliac artery where some resistance with advancement was met. Therefore the 5 [place] cm vascular sheath was placed. A Kumpe the catheter was then used to help guide the J wire to the distal abdominal aorta. The Kumpe catheter was exchanged for a 5 French C2 and under direct fluoroscopic guidance the superior  mesenteric artery was selected. Superior mesenteric angiogram was then performed. The superior mesenteric artery is widely patent. A focus of active extravasation was visualized arising from a distal branch of the right colic artery. A straight Lantern microcatheter and 0.014 inch soft synchro wire were then inserted and directed into the proximal right colic artery. Repeat angiogram was performed which demonstrated patent right colic artery with similar appearing focal active extravasation on the antimesenteric aspect of the distal ascending colon. Sub selective catheterization was attempted, however the lantern microcatheter would not track along the tortuous right colic artery. Therefore, the catheter was removed and a 2.0 [place] alpha microcatheter and 0.014 inch soft synchro wire were directed to the distal right colic artery. 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 Given the anti mesenteric aspect of the hemorrhage, 0.5 mL of dilute Gel-Foam slurry were slowly administered under fluoroscopic guidance. Next, a total of 3 microcoils were deployed in the distal branch of the right colic artery. The catheter was slightly retracted into the mid right colic artery and repeat, completion angiogram was performed. Completion angiogram demonstrated adequate pruning of a small segment of the distal ascending colon with resolution of previously visualized active extravasation. The microcatheter and 5 [place]  base catheter removed. The arteriotomy was closed with a 6 French Angio-Seal device without complication. The patient tolerated the procedure well was transferred to the floor in stable condition. IMPRESSION: 1. 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 2. Successful Gel-Foam and  coil embolization a terminal common distal branch of the right colic artery. Ruthann Cancer, MD [facility] Radiology Electronically Signed   By: Ruthann Cancer MD   On: 09/06/2020 16:49   IR Angiogram Selective Each Additional Vessel  Result Date: 09/06/2020 INDICATION: 75 year old female with end-stage renal disease presenting with hematochezia and CT evidence of diverticulosis and acute hemorrhage from the ascending colon near the hepatic flexure. EXAM: 1. Ultrasound-guided vascular access of the right common femoral artery 2. Selective catheterization of the superior mesenteric artery 3. Selective catheterization of the right colic artery 4. Sub selective catheterization of distal branch of right colic artery. 5. Gel-Foam and coil embolization of distal branch of right colic artery. 6. Deployment of closure device in the right common femoral artery MEDICATIONS: None. ANESTHESIA/SEDATION: Moderate (conscious) sedation was employed during this procedure. A total of Versed 0.5 mg and Fentanyl 50 mcg was administered intravenously. Moderate Sedation Time: 88 minutes. The patient's level of consciousness and vital signs were monitored continuously by radiology nursing throughout the procedure under my direct supervision. CONTRAST:  36m OMNIPAQUE IOHEXOL 300 MG/ML SOLN, 224mOMNIPAQUE IOHEXOL 300 MG/ML SOLN FLUOROSCOPY TIME:  Fluoroscopy Time: 22 minutes 0 seconds (500 mGy). COMPLICATIONS: None immediate. PROCEDURE: Informed consent was obtained from the patient following explanation of the procedure, risks, benefits and alternatives. The patient understands, agrees and consents for the procedure. All questions were addressed. A time out was performed prior to the initiation of the procedure. Maximal barrier sterile technique utilized including caps, mask, sterile gowns, sterile gloves, large sterile drape, hand hygiene, and Betadine prep. The right groin was prepped  and draped in standard fashion. Preprocedure ultrasound evaluation demonstrated widely patent right common femoral artery. The procedure was planned. Subdermal Local anesthesia was provided at the planned needle entry site. A small skin nick was made. Under direct ultrasound visualization, the right common femoral artery was accessed with a 21 gauge micropuncture needle. An image was captured and stored in a permanent record. The micropuncture sheath was inserted and limited right lower extremity angiogram was performed which demonstrated adequate puncture site for arterial closure device deployment. A J wire was inserted through the micropuncture sheath and directed to the level of the common iliac artery where some resistance with advancement was met. Therefore the 5 [place] cm vascular sheath was placed. A Kumpe the catheter was then used to help guide the J wire to the distal abdominal aorta. The Kumpe catheter was exchanged for a 5 French C2 and under direct fluoroscopic guidance the superior mesenteric artery was selected. Superior mesenteric angiogram was then performed. The superior mesenteric artery is widely patent. A focus of active extravasation was visualized arising from a distal branch of the right colic artery. A straight Lantern microcatheter and 0.014 inch soft synchro wire were then inserted and directed into the proximal right colic artery. Repeat angiogram was performed which demonstrated patent right colic artery with similar appearing focal active extravasation on the antimesenteric aspect of the distal ascending colon. Sub selective catheterization was attempted, however the lantern microcatheter would not track along the tortuous right colic artery. Therefore, the catheter was removed and a 2.0 [place]  Progreat alpha microcatheter and 0.014 inch soft synchro wire were directed to the distal right colic artery. Repeat angiogram was performed with sub selective catheter position which  demonstrated persistent focal active extravasation compatible with hemorrhagic diverticulum. Given the anti mesenteric aspect of the hemorrhage, 0.5 mL of dilute Gel-Foam slurry were slowly administered under fluoroscopic guidance. Next, a total of 3 microcoils were deployed in the distal branch of the right colic artery. The catheter was slightly retracted into the mid right colic artery and repeat, completion angiogram was performed. Completion angiogram demonstrated adequate pruning of a small segment of the distal ascending colon with resolution of previously visualized active extravasation. The microcatheter and 5 French base catheter removed. The arteriotomy was closed with a 6 French Angio-Seal device without complication. The patient tolerated the procedure well was transferred to the floor in stable condition. IMPRESSION: 1. 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 2. Successful Gel-Foam and coil embolization a terminal common distal branch of the right colic artery. Ruthann Cancer, MD [facility] Radiology Electronically Signed   By: Ruthann Cancer MD   On: 09/06/2020 16:49   IR Angiogram Selective Each Additional Vessel  Result Date: 09/06/2020 INDICATION: 75 year old female with end-stage renal disease presenting with hematochezia and CT evidence of diverticulosis and acute hemorrhage from the ascending colon near the hepatic flexure. EXAM: 1. Ultrasound-guided vascular access of the right common femoral artery 2. Selective catheterization of the superior mesenteric artery 3. Selective catheterization of the right colic artery 4. Sub selective catheterization of distal branch of right colic artery. 5. Gel-Foam and coil embolization of distal branch of right colic artery. 6. Deployment of closure device in the right common femoral artery MEDICATIONS:  None. ANESTHESIA/SEDATION: Moderate (conscious) sedation was employed during this procedure. A total of Versed 0.5 mg and Fentanyl 50 mcg was administered intravenously. Moderate Sedation Time: 88 minutes. The patient's level of consciousness and vital signs were monitored continuously by radiology nursing throughout the procedure under my direct supervision. CONTRAST:  45m OMNIPAQUE IOHEXOL 300 MG/ML SOLN, 228mOMNIPAQUE IOHEXOL 300 MG/ML SOLN FLUOROSCOPY TIME:  Fluoroscopy Time: 22 minutes 0 seconds (500 mGy). COMPLICATIONS: None immediate. PROCEDURE: Informed consent was obtained from the patient following explanation of the procedure, risks, benefits and alternatives. The patient understands, agrees and consents for the procedure. All questions were addressed. A time out was performed prior to the initiation of the procedure. Maximal barrier sterile technique utilized including caps, mask, sterile gowns, sterile gloves, large sterile drape, hand hygiene, and Betadine prep. The right groin was prepped and draped in standard fashion. Preprocedure ultrasound evaluation demonstrated widely patent right common femoral artery. The procedure was planned. Subdermal Local anesthesia was provided at the planned needle entry site. A small skin nick was made. Under direct ultrasound visualization, the right common femoral artery was accessed with a 21 gauge micropuncture needle. An image was captured and stored in a permanent record. The micropuncture sheath was inserted and limited right lower extremity angiogram was performed which demonstrated adequate puncture site for arterial closure device deployment. A J wire was inserted through the micropuncture sheath and directed to the level of the common iliac artery where some resistance with advancement was met. Therefore the 5 [place]11 cm vascular sheath was placed. A Kumpe the catheter was then used to help guide the J wire to the distal abdominal aorta. The Kumpe  catheter was exchanged for a  5 French C2 and under direct fluoroscopic guidance the superior mesenteric artery was selected. Superior mesenteric angiogram was then performed. The superior mesenteric artery is widely patent. A focus of active extravasation was visualized arising from a distal branch of the right colic artery. A straight Lantern microcatheter and 0.014 inch soft synchro wire were then inserted and directed into the proximal right colic artery. Repeat angiogram was performed which demonstrated patent right colic artery with similar appearing focal active extravasation on the antimesenteric aspect of the distal ascending colon. Sub selective catheterization was attempted, however the lantern microcatheter would not track along the tortuous right colic artery. Therefore, the catheter was removed and a 2.0 [place] Progreat alpha microcatheter and 0.014 inch soft synchro wire were directed to the distal right colic artery. 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 Given the anti mesenteric aspect of the hemorrhage, 0.5 mL of dilute Gel-Foam slurry were slowly administered under fluoroscopic guidance. Next, a total of 3 microcoils were deployed in the distal branch of the right colic artery. The catheter was slightly retracted into the mid right colic artery and repeat, completion angiogram was performed. Completion angiogram demonstrated adequate pruning of a small segment of the distal ascending colon with resolution of previously visualized active extravasation. The microcatheter and 5 French base catheter removed. The arteriotomy was closed with a 6 French Angio-Seal device without complication. The patient tolerated the procedure well was transferred to the floor in stable condition. IMPRESSION: 1. Focal, intraluminal active extravasation, likely secondary to diverticular  hemorrhage in the anti mesenteric aspect of the distal ascending colon. 2. Successful Gel-Foam and coil embolization a terminal common distal branch of the right colic artery. Ruthann Cancer, MD [facility] Radiology Electronically Signed   By: Ruthann Cancer MD   On: 09/06/2020 16:49   IR Angiogram Selective Each Additional Vessel  Result Date: 09/06/2020 INDICATION: 75 year old female with end-stage renal disease presenting with hematochezia and CT evidence of diverticulosis and acute hemorrhage from the ascending colon near the hepatic flexure. EXAM: 1. Ultrasound-guided vascular access of the right common femoral artery 2. Selective catheterization of the superior mesenteric artery 3. Selective catheterization of the right colic artery 4. Sub selective catheterization of distal branch of right colic artery. 5. Gel-Foam and coil embolization of distal branch of right colic artery. 6. Deployment of closure device in the right common femoral artery MEDICATIONS: None. ANESTHESIA/SEDATION: Moderate (conscious) sedation was employed during this procedure. A total of Versed 0.5 mg and Fentanyl 50 mcg was administered intravenously. Moderate Sedation Time: 88 minutes. The patient's level of consciousness and vital signs were monitored continuously by radiology nursing throughout the procedure under my direct supervision. CONTRAST:  26m OMNIPAQUE IOHEXOL 300 MG/ML SOLN, 233mOMNIPAQUE IOHEXOL 300 MG/ML SOLN FLUOROSCOPY TIME:  Fluoroscopy Time: 22 minutes 0 seconds (500 mGy). COMPLICATIONS: None immediate. PROCEDURE: Informed consent was obtained from the patient following explanation of the procedure, risks, benefits and alternatives. The patient understands, agrees and consents for the procedure. All questions were addressed. A time out was performed prior to the initiation of the procedure. Maximal barrier sterile technique utilized including caps, mask, sterile gowns,  sterile gloves, large sterile drape, hand hygiene, and Betadine prep. The right groin was prepped and draped in standard fashion. Preprocedure ultrasound evaluation demonstrated widely patent right common femoral artery. The procedure was planned. Subdermal Local anesthesia was provided at the planned needle entry site. A  small skin nick was made. Under direct ultrasound visualization, the right common femoral artery was accessed with a 21 gauge micropuncture needle. An image was captured and stored in a permanent record. The micropuncture sheath was inserted and limited right lower extremity angiogram was performed which demonstrated adequate puncture site for arterial closure device deployment. A J wire was inserted through the micropuncture sheath and directed to the level of the common iliac artery where some resistance with advancement was met. Therefore the 5 [place], 11 cm vascular sheath was placed. A Kumpe the catheter was then used to help guide the J wire to the distal abdominal aorta. The Kumpe catheter was exchanged for a 5 French C2 and under direct fluoroscopic guidance the superior mesenteric artery was selected. Superior mesenteric angiogram was then performed. The superior mesenteric artery is widely patent. A focus of active extravasation was visualized arising from a distal branch of the right colic artery. A straight Lantern microcatheter and 0.014 inch soft synchro wire were then inserted and directed into the proximal right colic artery. Repeat angiogram was performed which demonstrated patent right colic artery with similar appearing focal active extravasation on the antimesenteric aspect of the distal ascending colon. Sub selective catheterization was attempted, however the lantern microcatheter would not track along the tortuous right colic artery. Therefore, the catheter was removed and a 2.0 [place] Progreat alpha microcatheter and 0.014 inch soft synchro wire were directed to the distal  right colic artery. 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 Given the anti mesenteric aspect of the hemorrhage, 0.5 mL of dilute Gel-Foam slurry were slowly administered under fluoroscopic guidance. Next, a total of 3 microcoils were deployed in the distal branch of the right colic artery. The catheter was slightly retracted into the mid right colic artery and repeat, completion angiogram was performed. Completion angiogram demonstrated adequate pruning of a small segment of the distal ascending colon with resolution of previously visualized active extravasation. The microcatheter and 5 French base catheter removed. The arteriotomy was closed with a 6 French Angio-Seal device without complication. The patient tolerated the procedure well was transferred to the floor in stable condition. IMPRESSION: 1. 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 2. Successful Gel-Foam and coil embolization a terminal common distal branch of the right colic artery. Ruthann Cancer, MD [facility] Radiology Electronically Signed   By: Ruthann Cancer MD   On: 09/06/2020 16:49   IR US Guide Vasc Access Right  Result Date: 09/06/2020 INDICATION: 75 year old female with end-stage renal disease presenting with hematochezia and CT evidence of diverticulosis and acute hemorrhage from the ascending colon near the hepatic flexure. EXAM: 1. Ultrasound-guided vascular access of the right common femoral artery 2. Selective catheterization of the superior mesenteric artery 3. Selective catheterization of the right colic artery 4. Sub selective catheterization of distal branch of right colic artery. 5. Gel-Foam and coil embolization of distal branch of right colic artery. 6.  Deployment of closure device in the right common femoral artery MEDICATIONS: None. ANESTHESIA/SEDATION: Moderate (conscious) sedation was employed during this procedure. A total of Versed 0.5 mg and Fentanyl 50 mcg was administered intravenously. Moderate Sedation Time: 88 minutes. The patient's level of consciousness and vital signs were monitored continuously by radiology nursing throughout the procedure under my direct supervision. CONTRAST:  34m OMNIPAQUE IOHEXOL 300 MG/ML SOLN, 272mOMNIPAQUE  IOHEXOL 300 MG/ML SOLN FLUOROSCOPY TIME:  Fluoroscopy Time: 22 minutes 0 seconds (500 mGy). COMPLICATIONS: None immediate. PROCEDURE: Informed consent was obtained from the patient following explanation of the procedure, risks, benefits and alternatives. The patient understands, agrees and consents for the procedure. All questions were addressed. A time out was performed prior to the initiation of the procedure. Maximal barrier sterile technique utilized including caps, mask, sterile gowns, sterile gloves, large sterile drape, hand hygiene, and Betadine prep. The right groin was prepped and draped in standard fashion. Preprocedure ultrasound evaluation demonstrated widely patent right common femoral artery. The procedure was planned. Subdermal Local anesthesia was provided at the planned needle entry site. A small skin nick was made. Under direct ultrasound visualization, the right common femoral artery was accessed with a 21 gauge micropuncture needle. An image was captured and stored in a permanent record. The micropuncture sheath was inserted and limited right lower extremity angiogram was performed which demonstrated adequate puncture site for arterial closure device deployment. A J wire was inserted through the micropuncture sheath and directed to the level of the common iliac artery where some resistance with advancement was met. Therefore the 5 [place], 11 cm vascular sheath was placed. A Kumpe the catheter was then  used to help guide the J wire to the distal abdominal aorta. The Kumpe catheter was exchanged for a 5 French C2 and under direct fluoroscopic guidance the superior mesenteric artery was selected. Superior mesenteric angiogram was then performed. The superior mesenteric artery is widely patent. A focus of active extravasation was visualized arising from a distal branch of the right colic artery. A straight Lantern microcatheter and 0.014 inch soft synchro wire were then inserted and directed into the proximal right colic artery. Repeat angiogram was performed which demonstrated patent right colic artery with similar appearing focal active extravasation on the antimesenteric aspect of the distal ascending colon. Sub selective catheterization was attempted, however the lantern microcatheter would not track along the tortuous right colic artery. Therefore, the catheter was removed and a 2.0 [place] Progreat alpha microcatheter and 0.014 inch soft synchro wire were directed to the distal right colic artery. 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 Given the anti mesenteric aspect of the hemorrhage, 0.5 mL of dilute Gel-Foam slurry were slowly administered under fluoroscopic guidance. Next, a total of 3 microcoils were deployed in the distal branch of the right colic artery. The catheter was slightly retracted into the mid right colic artery and repeat, completion angiogram was performed. Completion angiogram demonstrated adequate pruning of a small segment of the distal ascending colon with resolution of previously visualized active extravasation. The microcatheter and 5 French base catheter removed. The arteriotomy was closed with a 6 French Angio-Seal device without complication. The patient tolerated the procedure well was transferred to the floor in stable condition. IMPRESSION: 1. Focal,  intraluminal active extravasation, likely secondary to diverticular hemorrhage in the anti mesenteric aspect of the distal ascending colon. 2. Successful Gel-Foam and coil embolization a terminal common distal branch of the right colic artery. Ruthann Cancer, MD [facility] Radiology Electronically Signed   By: Ruthann Cancer MD   On: 09/06/2020 16:49   IR EMBO ART  VEN HEMORR LYMPH EXTRAV  INC GUIDE ROADMAPPING  Result Date: 09/06/2020 INDICATION: 75 year old female with end-stage renal disease presenting with hematochezia and CT evidence of diverticulosis and acute hemorrhage from the ascending colon near the hepatic  flexure. EXAM: 1. Ultrasound-guided vascular access of the right common femoral artery 2. Selective catheterization of the superior mesenteric artery 3. Selective catheterization of the right colic artery 4. Sub selective catheterization of distal branch of right colic artery. 5. Gel-Foam and coil embolization of distal branch of right colic artery. 6. Deployment of closure device in the right common femoral artery MEDICATIONS: None. ANESTHESIA/SEDATION: Moderate (conscious) sedation was employed during this procedure. A total of Versed 0.5 mg and Fentanyl 50 mcg was administered intravenously. Moderate Sedation Time: 88 minutes. The patient's level of consciousness and vital signs were monitored continuously by radiology nursing throughout the procedure under my direct supervision. CONTRAST:  19m OMNIPAQUE IOHEXOL 300 MG/ML SOLN, 270mOMNIPAQUE IOHEXOL 300 MG/ML SOLN FLUOROSCOPY TIME:  Fluoroscopy Time: 22 minutes 0 seconds (500 mGy). COMPLICATIONS: None immediate. PROCEDURE: Informed consent was obtained from the patient following explanation of the procedure, risks, benefits and alternatives. The patient understands, agrees and consents for the procedure. All questions were addressed. A time out was performed prior to the initiation of the  procedure. Maximal barrier sterile technique utilized including caps, mask, sterile gowns, sterile gloves, large sterile drape, hand hygiene, and Betadine prep. The right groin was prepped and draped in standard fashion. Preprocedure ultrasound evaluation demonstrated widely patent right common femoral artery. The procedure was planned. Subdermal Local anesthesia was provided at the planned needle entry site. A small skin nick was made. Under direct ultrasound visualization, the right common femoral artery was accessed with a 21 gauge micropuncture needle. An image was captured and stored in a permanent record. The micropuncture sheath was inserted and limited right lower extremity angiogram was performed which demonstrated adequate puncture site for arterial closure device deployment. A J wire was inserted through the micropuncture sheath and directed to the level of the common iliac artery where some resistance with advancement was met. Therefore the 5 [place] cm vascular sheath was placed. A Kumpe the catheter was then used to help guide the J wire to the distal abdominal aorta. The Kumpe catheter was exchanged for a 5 French C2 and under direct fluoroscopic guidance the superior mesenteric artery was selected. Superior mesenteric angiogram was then performed. The superior mesenteric artery is widely patent. A focus of active extravasation was visualized arising from a distal branch of the right colic artery. A straight Lantern microcatheter and 0.014 inch soft synchro wire were then inserted and directed into the proximal right colic artery. Repeat angiogram was performed which demonstrated patent right colic artery with similar appearing focal active extravasation on the antimesenteric aspect of the distal ascending colon. Sub selective catheterization was attempted, however the lantern microcatheter would not track along the tortuous right colic artery. Therefore, the catheter was removed and a 2.0 Fr[place] alpha microcatheter and 0.014 inch soft synchro wire were directed to the distal right colic artery. 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 Given the anti mesenteric aspect of the hemorrhage, 0.5 mL of dilute Gel-Foam slurry were slowly administered under fluoroscopic guidance. Next, a total of 3 microcoils were deployed in the distal branch of the right colic artery. The catheter was slightly retracted into the mid right colic artery and repeat, completion angiogram was performed. Completion angiogram demonstrated adequate pruning of a small segment of the distal ascending colon with resolution of previously visualized active extravasation. The microcatheter and 5 French base catheter removed. The arteriotomy was closed with a 6 [place]  Angio-Seal device without complication. The patient tolerated the procedure well was transferred to the floor in stable condition. IMPRESSION: 1. 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 2. Successful Gel-Foam and coil embolization a terminal common distal branch of the right colic artery. Ruthann Cancer, MD [facility] Radiology Electronically Signed   By: Ruthann Cancer MD   On: 09/06/2020 16:49   CT Angio Abd/Pel W and/or Wo Contrast  Result Date: 09/07/2020 CLINICAL DATA:  GI bleeding. EXAM: CTA ABDOMEN AND PELVIS WITHOUT AND WITH CONTRAST TECHNIQUE: Multidetector CT imaging of the abdomen and pelvis was performed using the standard protocol during bolus administration of intravenous contrast. Multiplanar reconstructed images and MIPs were obtained and reviewed to evaluate the vascular anatomy. CONTRAST:  73m OMNIPAQUE IOHEXOL 350 MG/ML SOLN COMPARISON:  CTA from yesterday FINDINGS: VASCULAR  Aorta: No acute finding.  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 SMA: Approximately 50% atheromatous stenosis at the proximal SMA. Right colic branch embolization since prior. No active intraluminal hemorrhage or dissection seen. There is high-density within the lumen of the proximal colon which is also present on precontrast imaging and also seen in the additional small bowel loops Renals: Right more than left generalized narrow appearance in the setting of end-stage renal disease. IMA: Patent Inflow: Atheromatous iliac plaque bilaterally.  No dissection. Proximal Outflow: Unremarkable Veins: Unremarkable in the arterial phase Review of the MIP images confirms the above findings. NON-VASCULAR Lower chest:  No contributory findings. Hepatobiliary: No focal liver abnormality.Cholelithiasis. The gallbladder is full but there is no evidence of acute wall inflammation. Pancreas: Unremarkable. Spleen: Unremarkable. Adrenals/Urinary Tract: Negative adrenals. Severe bilateral renal atrophy in the setting of end-stage renal disease. Bilateral renal cortical cystic densities. Unremarkable collapsed bladder. Stomach/Bowel: No obstruction. Multiple colonic diverticula. No recurrent intraluminal hemorrhage is noted above. Mild pericolonic fat haziness but the embolized segment enhances similar to the remaining colon on the portal venous phase. Vascular/Lymphatic: No acute vascular abnormality. No mass or adenopathy. Reproductive:7 mm generalized endometrial thickness. Other: No ascites or pneumoperitoneum. Musculoskeletal: No acute abnormalities. Spinal degeneration and scoliosis. IMPRESSION: 1. No active intraluminal hemorrhage. No new or acute vascular finding. 2. Cholelithiasis. 3. 7 mm endometrial stripe, unexpected for age. Recommend PCP follow-up. Electronically Signed   By: JMonte FantasiaM.D.   On: 09/07/2020 04:12    CT Angio Abd/Pel W and/or Wo Contrast  Result Date: 09/06/2020 CLINICAL DATA:  GI bleed, bloody diarrhea and abdominal pain. EXAM: CT ANGIOGRAPHY ABDOMEN AND PELVIS WITH CONTRAST AND WITHOUT CONTRAST TECHNIQUE: Multidetector CT imaging of the abdomen and pelvis was performed using the standard protocol during bolus administration of intravenous contrast. Multiplanar reconstructed images and MIPs were obtained and reviewed to evaluate the vascular anatomy. CONTRAST:  861mOMNIPAQUE IOHEXOL 350 MG/ML SOLN COMPARISON:  CT of the abdomen and pelvis on 03/06/2015 FINDINGS: VASCULAR Aorta: Aortic atherosclerosis with calcified plaque present. No evidence of abdominal aortic aneurysm. 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 Distal branch vessels are patent and demonstrate normal branching anatomy. SMA: Roughly 50% stenosis at the origin of the SMA. SMA trunk and branch vessels are normally patent. See below for description of colonic bleed. Renals: Small caliber and patent bilateral renal arteries. IMA: The IMA is patent. Inflow: Iliac atherosclerosis without evidence of significant obstructive disease or aneurysmal disease bilaterally. Proximal Outflow:  Normally patent bilateral common femoral arteries and femoral bifurcations. Veins: Venous phase imaging demonstrates no venous abnormalities in the abdomen or pelvis. Review of the MIP images confirms the above findings. NON-VASCULAR Lower chest: No acute abnormality. Hepatobiliary: No focal liver abnormality is seen. Small calcified gallstones present. No gallbladder wall thickening, or biliary dilatation. Pancreas: Unremarkable. No pancreatic ductal dilatation or surrounding inflammatory changes. Spleen: Normal in size without focal abnormality. Adrenals/Urinary Tract: No adrenal masses. Atrophic kidneys consistent with known end-stage renal disease. Stomach/Bowel: Positive for acute bleeding from the level of a right-sided colonic  diverticulum near the hepatic flexure on the arterial phase of imaging into the colonic lumen with further accumulation of extravasated contrast on the venous phase of imaging. No other source of GI bleed identified on the CTA. Diverticulosis of the colon is primarily right-sided and extending into the transverse colon. No evidence of bowel obstruction, focal visible bowel lesion or free intraperitoneal air. Lymphatic: No enlarged abdominal or pelvic lymph nodes. Reproductive: Uterus and bilateral adnexa are unremarkable. Other: No abdominal wall hernia or abnormality. No abdominopelvic ascites. Musculoskeletal: No acute or significant osseous findings. IMPRESSION: 1. Positive for acute bleeding from the level of a right-sided colonic diverticulum near the hepatic flexure with further accumulation of extravasated contrast on the venous phase of imaging. No other source of GI bleed identified on the CTA. 2. 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 3. Atrophic kidneys consistent with known end-stage renal disease. 4. Cholelithiasis. 5. These results were called by telephone at the time of interpretation on 09/06/2020 at 1055 hrs to provider Marcene Brawn, PA-C, who verbally acknowledged these results. Electronically Signed   By: Aletta Edouard M.D.   On: 09/06/2020 11:01    Labs:  CBC: Recent Labs    06/14/20 0253 06/14/20 0309 09/06/20 0627 09/06/20 1754 09/06/20 1951 09/07/20 0142 09/07/20 0749  WBC 13.0*  --  8.9 9.3  --   --  9.0  HGB 10.6*   < > 10.5* 6.9* 7.2* 6.4* 7.6*  HCT 35.3*   < > 33.0* 22.2* 22.6* 20.1* 23.4*  PLT 243  --  213 181  --   --  148*   < > = values in this interval not displayed.    COAGS: Recent Labs    06/14/20 0253 09/06/20 0627  INR 1.0 1.0  APTT  --  29    BMP: Recent Labs    06/14/20 0253 06/14/20 0309 09/06/20 0627 09/06/20 1754 09/06/20 2149 09/07/20 0411  NA 139 141 139  --   --  136   K 5.9* 6.1* 6.1* 6.3* 6.2* 6.3*  CL 100  --  102  --   --  100  CO2 23  --  22  --   --  24  GLUCOSE 119*  --  116*  --   --  95  BUN 52*  --  50*  --   --  58*  CALCIUM 8.8*  --  9.2  --   --  8.6*  CREATININE 9.35*  --  9.63*  --   --  11.53*  GFRNONAA 4*  --  4*  --   --  3*    LIVER FUNCTION TESTS: Recent Labs    06/14/20 0253 09/06/20 0627  BILITOT 0.7 0.6  AST 32 31  ALT 18 18  ALKPHOS 130* 115  PROT 7.3 7.6  ALBUMIN 3.5 3.7  Assessment and Plan:  75 y/o F admitted for GI bleed s/p distal right gelfoam and coil embolization of distal right colic artery yesterday in IR seen today for routine follow up.   Overnight patient noted to have hgb 6.4 and was given 1 unit pRBCs, nursing noted bloody BM as well. CTA was ordered which was negative for active bleeding. Hgb this AM 7.6. Her K+ remains elevated at 6.3 and she is planned for HD this morning per nephrology.   Patient initially reported some abdominal pain and fatigue but later in visit said she feels fine and wants to eat. Will defer diet to primary team. Right CFA puncture site unremarkable, dressed appropriately, non tender and non pulsatile.   No plans for any procedures in IR at this time as patient is stable at this time, further plans per GI/primary team. IR remains available as needed - please call with questions or concerns.  Electronically Signed: Joaquim Nam, PA-C 09/07/2020, 9:34 AM   I spent a total of 15 Minutes at the the patient's bedside AND on the patient's hospital floor or unit, greater than 50% of which was counseling/coordinating care for GI bleed follow up.

## 2020-09-07 NOTE — Progress Notes (Addendum)
Daily Rounding Note  09/07/2020, 11:45 AM  LOS: 0 days   SUBJECTIVE:   Chief complaint:   Painless hematochezia Feels well.  No weakness, no dizziness.  Eating the bulk of her solid food.  Has not had any bowel movements, bloody or otherwise since yesterday morning.  No abdominal pain. Wondering when she is going to have dialysis which she missed yesterday.  OBJECTIVE:         Vital signs in last 24 hours:    Temp:  [97.6 F (36.4 C)-98.7 F (37.1 C)] 98.1 F (36.7 C) (04/19 0521) Pulse Rate:  [72-91] 73 (04/19 0521) Resp:  [15-29] 15 (04/19 0521) BP: (134-201)/(67-99) 148/72 (04/19 0521) SpO2:  [95 %-100 %] 95 % (04/19 0521) Last BM Date: 09/06/20 There were no vitals filed for this visit. General: Looks great, hard to believe she is a dialysis patient. Heart: RRR. Chest: Clear bilaterally without labored breathing or cough Abdomen: Soft, nontender, nondistended.  Active bowel sounds. 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  No CCE. Neuro/Psych: Alert.  Appropriate.  Fluid speech.  No gross deficits, tremors, weakness  Intake/Output from previous day: 04/18 0701 - 04/19 0700 In: 583.8 [I.V.:261.3; Blood:322.5] Out: -   Intake/Output this shift: Total I/O In: 120 [P.O.:120] Out: -   Lab Results: Recent Labs    09/06/20 0627 09/06/20 1754 09/06/20 1951 09/07/20 0142 09/07/20 0749  WBC 8.9 9.3  --   --  9.0  HGB 10.5* 6.9* 7.2* 6.4* 7.6*  HCT 33.0* 22.2* 22.6* 20.1* 23.4*  PLT 213 181  --   --  148*   BMET Recent Labs    09/06/20 0627 09/06/20 1754 09/06/20 2149 09/07/20 0411  NA 139  --   --  136  K 6.1* 6.3* 6.2* 6.3*  CL 102  --   --  100  CO2 22  --   --  24  GLUCOSE 116*  --   --  95  BUN 50*  --   --  58*  CREATININE 9.63*  --   --  11.53*  CALCIUM 9.2  --   --  8.6*   LFT Recent Labs    09/06/20 0627  PROT 7.6  ALBUMIN 3.7  AST 31  ALT 18  ALKPHOS 115  BILITOT 0.6    PT/INR Recent Labs    09/06/20 0627  LABPROT 13.5  INR 1.0   Hepatitis Panel No results for input(s): HEPBSAG, HCVAB, HEPAIGM, HEPBIGM in the last 72 hours.  Studies/Results: IR Angiogram Visceral Selective  Result Date: 09/06/2020 INDICATION: 75 year old female with end-stage renal disease presenting with hematochezia and CT evidence of diverticulosis and acute hemorrhage from the ascending colon near the hepatic flexure. EXAM: 1. Ultrasound-guided vascular access of the right common femoral artery 2. Selective catheterization of the superior mesenteric artery 3. Selective catheterization of the right colic artery 4. Sub selective catheterization of distal branch of right colic artery. 5. Gel-Foam and coil embolization of distal branch of right colic artery. 6. Deployment of closure device in the right common femoral artery MEDICATIONS: None. ANESTHESIA/SEDATION: Moderate (conscious) sedation was employed during this procedure. A total of Versed 0.5 mg and Fentanyl 50 mcg was administered intravenously. Moderate Sedation Time: 88 minutes. The patient's level of consciousness and vital signs were monitored continuously by radiology nursing throughout the procedure under my direct supervision. CONTRAST:  68m OMNIPAQUE IOHEXOL 300 MG/ML SOLN, 270mOMNIPAQUE IOHEXOL 300 MG/ML SOLN FLUOROSCOPY TIME:  Fluoroscopy  Time: 22 minutes 0 seconds (500 mGy). COMPLICATIONS: None immediate. PROCEDURE: Informed consent was obtained from the patient following explanation of the procedure, risks, benefits and alternatives. The patient understands, agrees and consents for the procedure. All questions were addressed. A time out was performed prior to the initiation of the procedure. Maximal barrier sterile technique utilized including caps, mask, sterile gowns, sterile gloves, large sterile drape, hand hygiene, and Betadine prep. The right groin was prepped and draped in standard fashion. Preprocedure ultrasound  evaluation demonstrated widely patent right common femoral artery. The procedure was planned. Subdermal Local anesthesia was provided at the planned needle entry site. A small skin nick was made. Under direct ultrasound visualization, the right common femoral artery was accessed with a 21 gauge micropuncture needle. An image was captured and stored in a permanent record. The micropuncture sheath was inserted and limited right lower extremity angiogram was performed which demonstrated adequate puncture site for arterial closure device deployment. A J wire was inserted through the micropuncture sheath and directed to the level of the common iliac artery where some resistance with advancement was met. Therefore the 5 [place], 11 cm vascular sheath was placed. A Kumpe the catheter was then used to help guide the J wire to the distal abdominal aorta. The Kumpe catheter was exchanged for a 5 French C2 and under direct fluoroscopic guidance the superior mesenteric artery was selected. Superior mesenteric angiogram was then performed. The superior mesenteric artery is widely patent. A focus of active extravasation was visualized arising from a distal branch of the right colic artery. A straight Lantern microcatheter and 0.014 inch soft synchro wire were then inserted and directed into the proximal right colic artery. Repeat angiogram was performed which demonstrated patent right colic artery with similar appearing focal active extravasation on the antimesenteric aspect of the distal ascending colon. Sub selective catheterization was attempted, however the lantern microcatheter would not track along the tortuous right colic artery. Therefore, the catheter was removed and a 2.0 [place] Progreat alpha microcatheter and 0.014 inch soft synchro wire were directed to the distal right colic artery. Repeat angiogram was performed with sub selective catheter position which demonstrated persistent focal active extravasation compatible  with hemorrhagic diverticulum. Given the anti mesenteric aspect of the hemorrhage, 0.5 mL of dilute Gel-Foam slurry were slowly administered under fluoroscopic guidance. Next, a total of 3 microcoils were deployed in the distal branch of the right colic artery. The catheter was slightly retracted into the mid right colic artery and repeat, completion angiogram was performed. Completion angiogram demonstrated adequate pruning of a small segment of the distal ascending colon with resolution of previously visualized active extravasation. The microcatheter and 5 French base catheter removed. The arteriotomy was closed with a 6 French Angio-Seal device without complication. The patient tolerated the procedure well was transferred to the floor in stable condition. IMPRESSION: 1. 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 2. Successful Gel-Foam and coil embolization a terminal common distal branch of the right colic artery. Ruthann Cancer, MD [facility] Radiology Electronically Signed   By: Ruthann Cancer MD   On: 09/06/2020 16:49   IR Angiogram Selective Each Additional Vessel  Result Date: 09/06/2020 INDICATION: 75 year old female with end-stage renal disease presenting with hematochezia and CT evidence of diverticulosis and acute hemorrhage from the ascending colon near the hepatic flexure. EXAM: 1. Ultrasound-guided vascular access of the right common femoral artery 2. Selective  catheterization of the superior mesenteric artery 3. Selective catheterization of the right colic artery 4. Sub selective catheterization of distal branch of right colic artery. 5. Gel-Foam and coil embolization of distal branch of right colic artery. 6. Deployment of closure device in the right common femoral artery MEDICATIONS: None. ANESTHESIA/SEDATION: Moderate (conscious) sedation was  employed during this procedure. A total of Versed 0.5 mg and Fentanyl 50 mcg was administered intravenously. Moderate Sedation Time: 88 minutes. The patient's level of consciousness and vital signs were monitored continuously by radiology nursing throughout the procedure under my direct supervision. CONTRAST:  62m OMNIPAQUE IOHEXOL 300 MG/ML SOLN, 239mOMNIPAQUE IOHEXOL 300 MG/ML SOLN FLUOROSCOPY TIME:  Fluoroscopy Time: 22 minutes 0 seconds (500 mGy). COMPLICATIONS: None immediate. PROCEDURE: Informed consent was obtained from the patient following explanation of the procedure, risks, benefits and alternatives. The patient understands, agrees and consents for the procedure. All questions were addressed. A time out was performed prior to the initiation of the procedure. Maximal barrier sterile technique utilized including caps, mask, sterile gowns, sterile gloves, large sterile drape, hand hygiene, and Betadine prep. The right groin was prepped and draped in standard fashion. Preprocedure ultrasound evaluation demonstrated widely patent right common femoral artery. The procedure was planned. Subdermal Local anesthesia was provided at the planned needle entry site. A small skin nick was made. Under direct ultrasound visualization, the right common femoral artery was accessed with a 21 gauge micropuncture needle. An image was captured and stored in a permanent record. The micropuncture sheath was inserted and limited right lower extremity angiogram was performed which demonstrated adequate puncture site for arterial closure device deployment. A J wire was inserted through the micropuncture sheath and directed to the level of the common iliac artery where some resistance with advancement was met. Therefore the 5 [place] cm vascular sheath was placed. A Kumpe the catheter was then used to help guide the J wire to the distal abdominal aorta. The Kumpe catheter was exchanged for a 5 French C2 and under direct  fluoroscopic guidance the superior mesenteric artery was selected. Superior mesenteric angiogram was then performed. The superior mesenteric artery is widely patent. A focus of active extravasation was visualized arising from a distal branch of the right colic artery. A straight Lantern microcatheter and 0.014 inch soft synchro wire were then inserted and directed into the proximal right colic artery. Repeat angiogram was performed which demonstrated patent right colic artery with similar appearing focal active extravasation on the antimesenteric aspect of the distal ascending colon. Sub selective catheterization was attempted, however the lantern microcatheter would not track along the tortuous right colic artery. Therefore, the catheter was removed and a 2.0 [place] alpha microcatheter and 0.014 inch soft synchro wire were directed to the distal right colic artery. 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 Given the anti mesenteric aspect of the hemorrhage, 0.5 mL of dilute Gel-Foam slurry were slowly administered under fluoroscopic guidance. Next, a total of 3 microcoils were deployed in the distal branch of the right colic artery. The catheter was slightly retracted into the mid right colic artery and repeat, completion angiogram was performed. Completion angiogram demonstrated adequate pruning of a small segment of the distal ascending colon with resolution of previously visualized active extravasation. The microcatheter and 5 French base catheter removed. The arteriotomy was closed with a 6 French Angio-Seal device without complication. The patient tolerated the procedure well was transferred to the  floor in stable condition. IMPRESSION: 1. Focal, intraluminal active extravasation, likely secondary to diverticular hemorrhage in the anti mesenteric aspect of the distal ascending  colon. 2. Successful Gel-Foam and coil embolization a terminal common distal branch of the right colic artery. Ruthann Cancer, MD [facility] Radiology Electronically Signed   By: Ruthann Cancer MD   On: 09/06/2020 16:49   IR Angiogram Selective Each Additional Vessel  Result Date: 09/06/2020 INDICATION: 75 year old female with end-stage renal disease presenting with hematochezia and CT evidence of diverticulosis and acute hemorrhage from the ascending colon near the hepatic flexure. EXAM: 1. Ultrasound-guided vascular access of the right common femoral artery 2. Selective catheterization of the superior mesenteric artery 3. Selective catheterization of the right colic artery 4. Sub selective catheterization of distal branch of right colic artery. 5. Gel-Foam and coil embolization of distal branch of right colic artery. 6. Deployment of closure device in the right common femoral artery MEDICATIONS: None. ANESTHESIA/SEDATION: Moderate (conscious) sedation was employed during this procedure. A total of Versed 0.5 mg and Fentanyl 50 mcg was administered intravenously. Moderate Sedation Time: 88 minutes. The patient's level of consciousness and vital signs were monitored continuously by radiology nursing throughout the procedure under my direct supervision. CONTRAST:  77m OMNIPAQUE IOHEXOL 300 MG/ML SOLN, 234mOMNIPAQUE IOHEXOL 300 MG/ML SOLN FLUOROSCOPY TIME:  Fluoroscopy Time: 22 minutes 0 seconds (500 mGy). COMPLICATIONS: None immediate. PROCEDURE: Informed consent was obtained from the patient following explanation of the procedure, risks, benefits and alternatives. The patient understands, agrees and consents for the procedure. All questions were addressed. A time out was performed prior to the initiation of the procedure. Maximal barrier sterile technique utilized including caps, mask, sterile gowns, sterile gloves, large sterile drape, hand hygiene, and Betadine  prep. The right groin was prepped and draped in standard fashion. Preprocedure ultrasound evaluation demonstrated widely patent right common femoral artery. The procedure was planned. Subdermal Local anesthesia was provided at the planned needle entry site. A small skin nick was made. Under direct ultrasound visualization, the right common femoral artery was accessed with a 21 gauge micropuncture needle. An image was captured and stored in a permanent record. The micropuncture sheath was inserted and limited right lower extremity angiogram was performed which demonstrated adequate puncture site for arterial closure device deployment. A J wire was inserted through the micropuncture sheath and directed to the level of the common iliac artery where some resistance with advancement was met. Therefore the 5 [place] cm vascular sheath was placed. A Kumpe the catheter was then used to help guide the J wire to the distal abdominal aorta. The Kumpe catheter was exchanged for a 5 French C2 and under direct fluoroscopic guidance the superior mesenteric artery was selected. Superior mesenteric angiogram was then performed. The superior mesenteric artery is widely patent. A focus of active extravasation was visualized arising from a distal branch of the right colic artery. A straight Lantern microcatheter and 0.014 inch soft synchro wire were then inserted and directed into the proximal right colic artery. Repeat angiogram was performed which demonstrated patent right colic artery with similar appearing focal active extravasation on the antimesenteric aspect of the distal ascending colon. Sub selective catheterization was attempted, however the lantern microcatheter would not track along the tortuous right colic artery. Therefore, the catheter was removed and a 2.0 [place] alpha microcatheter and 0.014 inch soft synchro wire were directed to the distal right colic artery. Repeat angiogram was performed with sub  selective  catheter position which demonstrated persistent focal active extravasation compatible with hemorrhagic diverticulum. Given the anti mesenteric aspect of the hemorrhage, 0.5 mL of dilute Gel-Foam slurry were slowly administered under fluoroscopic guidance. Next, a total of 3 microcoils were deployed in the distal branch of the right colic artery. The catheter was slightly retracted into the mid right colic artery and repeat, completion angiogram was performed. Completion angiogram demonstrated adequate pruning of a small segment of the distal ascending colon with resolution of previously visualized active extravasation. The microcatheter and 5 French base catheter removed. The arteriotomy was closed with a 6 French Angio-Seal device without complication. The patient tolerated the procedure well was transferred to the floor in stable condition. IMPRESSION: 1. 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 2. Successful Gel-Foam and coil embolization a terminal common distal branch of the right colic artery. Ruthann Cancer, MD [facility] Radiology Electronically Signed   By: Ruthann Cancer MD   On: 09/06/2020 16:49   IR Angiogram Selective Each Additional Vessel  Result Date: 09/06/2020 INDICATION: 75 year old female with end-stage renal disease presenting with hematochezia and CT evidence of diverticulosis and acute hemorrhage from the ascending colon near the hepatic flexure. EXAM: 1. Ultrasound-guided vascular access of the right common femoral artery 2. Selective catheterization of the superior mesenteric artery 3. Selective catheterization of the right colic artery 4. Sub selective catheterization of distal branch of right colic artery. 5. Gel-Foam and coil embolization of distal branch of right colic artery. 6. Deployment of closure device in the right  common femoral artery MEDICATIONS: None. ANESTHESIA/SEDATION: Moderate (conscious) sedation was employed during this procedure. A total of Versed 0.5 mg and Fentanyl 50 mcg was administered intravenously. Moderate Sedation Time: 88 minutes. The patient's level of consciousness and vital signs were monitored continuously by radiology nursing throughout the procedure under my direct supervision. CONTRAST:  93m OMNIPAQUE IOHEXOL 300 MG/ML SOLN, 255mOMNIPAQUE IOHEXOL 300 MG/ML SOLN FLUOROSCOPY TIME:  Fluoroscopy Time: 22 minutes 0 seconds (500 mGy). COMPLICATIONS: None immediate. PROCEDURE: Informed consent was obtained from the patient following explanation of the procedure, risks, benefits and alternatives. The patient understands, agrees and consents for the procedure. All questions were addressed. A time out was performed prior to the initiation of the procedure. Maximal barrier sterile technique utilized including caps, mask, sterile gowns, sterile gloves, large sterile drape, hand hygiene, and Betadine prep. The right groin was prepped and draped in standard fashion. Preprocedure ultrasound evaluation demonstrated widely patent right common femoral artery. The procedure was planned. Subdermal Local anesthesia was provided at the planned needle entry site. A small skin nick was made. Under direct ultrasound visualization, the right common femoral artery was accessed with a 21 gauge micropuncture needle. An image was captured and stored in a permanent record. The micropuncture sheath was inserted and limited right lower extremity angiogram was performed which demonstrated adequate puncture site for arterial closure device deployment. A J wire was inserted through the micropuncture sheath and directed to the level of the common iliac artery where some resistance with advancement was met. Therefore the 5 [place] cm vascular sheath was placed. A Kumpe the catheter was then used to help guide the J wire to the  distal abdominal aorta. The Kumpe catheter was exchanged for a 5 French C2 and under direct fluoroscopic guidance the superior mesenteric artery was selected. Superior mesenteric angiogram was then performed. The superior mesenteric artery is  widely patent. A focus of active extravasation was visualized arising from a distal branch of the right colic artery. A straight Lantern microcatheter and 0.014 inch soft synchro wire were then inserted and directed into the proximal right colic artery. Repeat angiogram was performed which demonstrated patent right colic artery with similar appearing focal active extravasation on the antimesenteric aspect of the distal ascending colon. Sub selective catheterization was attempted, however the lantern microcatheter would not track along the tortuous right colic artery. Therefore, the catheter was removed and a 2.0 [place] Progreat alpha microcatheter and 0.014 inch soft synchro wire were directed to the distal right colic artery. 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 Given the anti mesenteric aspect of the hemorrhage, 0.5 mL of dilute Gel-Foam slurry were slowly administered under fluoroscopic guidance. Next, a total of 3 microcoils were deployed in the distal branch of the right colic artery. The catheter was slightly retracted into the mid right colic artery and repeat, completion angiogram was performed. Completion angiogram demonstrated adequate pruning of a small segment of the distal ascending colon with resolution of previously visualized active extravasation. The microcatheter and 5 French base catheter removed. The arteriotomy was closed with a 6 French Angio-Seal device without complication. The patient tolerated the procedure well was transferred to the floor in stable condition. IMPRESSION: 1. Focal, intraluminal active extravasation, likely  secondary to diverticular hemorrhage in the anti mesenteric aspect of the distal ascending colon. 2. Successful Gel-Foam and coil embolization a terminal common distal branch of the right colic artery. Ruthann Cancer, MD [facility] Radiology Electronically Signed   By: Ruthann Cancer MD   On: 09/06/2020 16:49   IR US Guide Vasc Access Right  Result Date: 09/06/2020 INDICATION: 75 year old female with end-stage renal disease presenting with hematochezia and CT evidence of diverticulosis and acute hemorrhage from the ascending colon near the hepatic flexure. EXAM: 1. Ultrasound-guided vascular access of the right common femoral artery 2. Selective catheterization of the superior mesenteric artery 3. Selective catheterization of the right colic artery 4. Sub selective catheterization of distal branch of right colic artery. 5. Gel-Foam and coil embolization of distal branch of right colic artery. 6. Deployment of closure device in the right common femoral artery MEDICATIONS: None. ANESTHESIA/SEDATION: Moderate (conscious) sedation was employed during this procedure. A total of Versed 0.5 mg and Fentanyl 50 mcg was administered intravenously. Moderate Sedation Time: 88 minutes. The patient's level of consciousness and vital signs were monitored continuously by radiology nursing throughout the procedure under my direct supervision. CONTRAST:  90m OMNIPAQUE IOHEXOL 300 MG/ML SOLN, 265mOMNIPAQUE IOHEXOL 300 MG/ML SOLN FLUOROSCOPY TIME:  Fluoroscopy Time: 22 minutes 0 seconds (500 mGy). COMPLICATIONS: None immediate. PROCEDURE: Informed consent was obtained from the patient following explanation of the procedure, risks, benefits and alternatives. The patient understands, agrees and consents for the procedure. All questions were addressed. A time out was performed prior to the initiation of the procedure. Maximal barrier sterile technique utilized including caps, mask,  sterile gowns, sterile gloves, large sterile drape, hand hygiene, and Betadine prep. The right groin was prepped and draped in standard fashion. Preprocedure ultrasound evaluation demonstrated widely patent right common femoral artery. The procedure was planned. Subdermal Local anesthesia was provided at the planned needle entry site. A small skin nick was made. Under direct ultrasound visualization, the right common femoral artery was accessed with a 21 gauge micropuncture needle. An image was  captured and stored in a permanent record. The micropuncture sheath was inserted and limited right lower extremity angiogram was performed which demonstrated adequate puncture site for arterial closure device deployment. A J wire was inserted through the micropuncture sheath and directed to the level of the common iliac artery where some resistance with advancement was met. Therefore the 5 [place], 11 cm vascular sheath was placed. A Kumpe the catheter was then used to help guide the J wire to the distal abdominal aorta. The Kumpe catheter was exchanged for a 5 French C2 and under direct fluoroscopic guidance the superior mesenteric artery was selected. Superior mesenteric angiogram was then performed. The superior mesenteric artery is widely patent. A focus of active extravasation was visualized arising from a distal branch of the right colic artery. A straight Lantern microcatheter and 0.014 inch soft synchro wire were then inserted and directed into the proximal right colic artery. Repeat angiogram was performed which demonstrated patent right colic artery with similar appearing focal active extravasation on the antimesenteric aspect of the distal ascending colon. Sub selective catheterization was attempted, however the lantern microcatheter would not track along the tortuous right colic artery. Therefore, the catheter was removed and a 2.0 [place] Progreat alpha microcatheter and 0.014 inch soft synchro wire were directed  to the distal right colic artery. 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 Given the anti mesenteric aspect of the hemorrhage, 0.5 mL of dilute Gel-Foam slurry were slowly administered under fluoroscopic guidance. Next, a total of 3 microcoils were deployed in the distal branch of the right colic artery. The catheter was slightly retracted into the mid right colic artery and repeat, completion angiogram was performed. Completion angiogram demonstrated adequate pruning of a small segment of the distal ascending colon with resolution of previously visualized active extravasation. The microcatheter and 5 French base catheter removed. The arteriotomy was closed with a 6 French Angio-Seal device without complication. The patient tolerated the procedure well was transferred to the floor in stable condition. IMPRESSION: 1. 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 2. Successful Gel-Foam and coil embolization a terminal common distal branch of the right colic artery. Ruthann Cancer, MD [facility] Radiology Electronically Signed   By: Ruthann Cancer MD   On: 09/06/2020 16:49   IR EMBO ART  VEN HEMORR LYMPH EXTRAV  INC GUIDE ROADMAPPING  Result Date: 09/06/2020 INDICATION: 75 year old female with end-stage renal disease presenting with hematochezia and CT evidence of diverticulosis and acute hemorrhage from the ascending colon near the hepatic flexure. EXAM: 1. Ultrasound-guided vascular access of the right common femoral artery 2. Selective catheterization of the superior mesenteric artery 3. Selective catheterization of the right colic artery 4. Sub selective catheterization of distal branch of right colic artery. 5. Gel-Foam and coil embolization  of distal branch of right colic artery. 6. Deployment of closure device in the right common femoral artery MEDICATIONS: None. ANESTHESIA/SEDATION: Moderate (conscious) sedation was employed during this procedure. A total of Versed 0.5 mg and Fentanyl 50 mcg was administered intravenously. Moderate Sedation Time: 88 minutes. The patient's level of consciousness and vital signs were monitored continuously by radiology nursing throughout the procedure under my direct supervision. CONTRAST:  55m OMNIPAQUE IOHEXOL 300 MG/ML SOLN, 268mOMNIPAQUE IOHEXOL 300 MG/ML SOLN FLUOROSCOPY TIME:  Fluoroscopy Time: 22 minutes 0 seconds (500 mGy). COMPLICATIONS: None immediate. PROCEDURE:  Informed consent was obtained from the patient following explanation of the procedure, risks, benefits and alternatives. The patient understands, agrees and consents for the procedure. All questions were addressed. A time out was performed prior to the initiation of the procedure. Maximal barrier sterile technique utilized including caps, mask, sterile gowns, sterile gloves, large sterile drape, hand hygiene, and Betadine prep. The right groin was prepped and draped in standard fashion. Preprocedure ultrasound evaluation demonstrated widely patent right common femoral artery. The procedure was planned. Subdermal Local anesthesia was provided at the planned needle entry site. A small skin nick was made. Under direct ultrasound visualization, the right common femoral artery was accessed with a 21 gauge micropuncture needle. An image was captured and stored in a permanent record. The micropuncture sheath was inserted and limited right lower extremity angiogram was performed which demonstrated adequate puncture site for arterial closure device deployment. A J wire was inserted through the micropuncture sheath and directed to the level of the common iliac artery where some resistance with advancement was met. Therefore the 5 [place], 11 cm vascular  sheath was placed. A Kumpe the catheter was then used to help guide the J wire to the distal abdominal aorta. The Kumpe catheter was exchanged for a 5 French C2 and under direct fluoroscopic guidance the superior mesenteric artery was selected. Superior mesenteric angiogram was then performed. The superior mesenteric artery is widely patent. A focus of active extravasation was visualized arising from a distal branch of the right colic artery. A straight Lantern microcatheter and 0.014 inch soft synchro wire were then inserted and directed into the proximal right colic artery. Repeat angiogram was performed which demonstrated patent right colic artery with similar appearing focal active extravasation on the antimesenteric aspect of the distal ascending colon. Sub selective catheterization was attempted, however the lantern microcatheter would not track along the tortuous right colic artery. Therefore, the catheter was removed and a 2.0 [place] Progreat alpha microcatheter and 0.014 inch soft synchro wire were directed to the distal right colic artery. 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 Given the anti mesenteric aspect of the hemorrhage, 0.5 mL of dilute Gel-Foam slurry were slowly administered under fluoroscopic guidance. Next, a total of 3 microcoils were deployed in the distal branch of the right colic artery. The catheter was slightly retracted into the mid right colic artery and repeat, completion angiogram was performed. Completion angiogram demonstrated adequate pruning of a small segment of the distal ascending colon with resolution of previously visualized active extravasation. The microcatheter and 5 French base catheter removed. The arteriotomy was closed with a 6 French Angio-Seal device without complication. The patient tolerated the procedure well was transferred to the floor  in stable condition. IMPRESSION: 1. 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 2. Successful Gel-Foam and coil embolization a terminal common distal branch of the right colic artery. Ruthann Cancer, MD [facility] Radiology Electronically Signed   By: Ruthann Cancer MD   On: 09/06/2020 16:49   CT Angio Abd/Pel W and/or Wo Contrast  Result Date: 09/07/2020 CLINICAL DATA:  GI bleeding. EXAM: CTA ABDOMEN AND PELVIS WITHOUT AND WITH CONTRAST TECHNIQUE: Multidetector CT imaging of the abdomen and pelvis was performed using the standard protocol during bolus administration of intravenous contrast. Multiplanar reconstructed images and MIPs were obtained and reviewed to evaluate the vascular anatomy.  CONTRAST:  58m OMNIPAQUE IOHEXOL 350 MG/ML SOLN COMPARISON:  CTA from yesterday FINDINGS: VASCULAR Aorta: No acute finding.  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 SMA: Approximately 50% atheromatous stenosis at the proximal SMA. Right colic branch embolization since prior. No active intraluminal hemorrhage or dissection seen. There is high-density within the lumen of the proximal colon which is also present on precontrast imaging and also seen in the additional small bowel loops Renals: Right more than left generalized narrow appearance in the setting of end-stage renal disease. IMA: Patent Inflow: Atheromatous iliac plaque bilaterally.  No dissection. Proximal Outflow: Unremarkable Veins: Unremarkable in the arterial phase Review of the MIP images confirms the above findings. NON-VASCULAR Lower chest:  No contributory findings. Hepatobiliary: No focal liver abnormality.Cholelithiasis. The gallbladder is full but there is no evidence of acute wall inflammation. Pancreas:  Unremarkable. Spleen: Unremarkable. Adrenals/Urinary Tract: Negative adrenals. Severe bilateral renal atrophy in the setting of end-stage renal disease. Bilateral renal cortical cystic densities. Unremarkable collapsed bladder. Stomach/Bowel: No obstruction. Multiple colonic diverticula. No recurrent intraluminal hemorrhage is noted above. Mild pericolonic fat haziness but the embolized segment enhances similar to the remaining colon on the portal venous phase. Vascular/Lymphatic: No acute vascular abnormality. No mass or adenopathy. Reproductive:7 mm generalized endometrial thickness. Other: No ascites or pneumoperitoneum. Musculoskeletal: No acute abnormalities. Spinal degeneration and scoliosis. IMPRESSION: 1. No active intraluminal hemorrhage. No new or acute vascular finding. 2. Cholelithiasis. 3. 7 mm endometrial stripe, unexpected for age. Recommend PCP follow-up. Electronically Signed   By: JMonte FantasiaM.D.   On: 09/07/2020 04:12   CT Angio Abd/Pel W and/or Wo Contrast  Result Date: 09/06/2020 CLINICAL DATA:  GI bleed, bloody diarrhea and abdominal pain. EXAM: CT ANGIOGRAPHY ABDOMEN AND PELVIS WITH CONTRAST AND WITHOUT CONTRAST TECHNIQUE: Multidetector CT imaging of the abdomen and pelvis was performed using the standard protocol during bolus administration of intravenous contrast. Multiplanar reconstructed images and MIPs were obtained and reviewed to evaluate the vascular anatomy. CONTRAST:  874mOMNIPAQUE IOHEXOL 350 MG/ML SOLN COMPARISON:  CT of the abdomen and pelvis on 03/06/2015 FINDINGS: VASCULAR Aorta: Aortic atherosclerosis with calcified plaque present. No evidence of abdominal aortic aneurysm. 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 Distal branch vessels are patent and demonstrate normal branching anatomy. SMA: Roughly 50% stenosis at the origin of the SMA. SMA trunk and branch vessels are normally patent. See below for description of colonic bleed. Renals:  Small caliber and patent bilateral renal arteries. IMA: The IMA is patent. Inflow: Iliac atherosclerosis without evidence of significant obstructive disease or aneurysmal disease bilaterally. Proximal Outflow: Normally patent bilateral common femoral arteries and femoral bifurcations. Veins: Venous phase imaging demonstrates no venous abnormalities in the abdomen or pelvis. Review of the MIP images confirms the above findings. NON-VASCULAR Lower chest: No acute abnormality. Hepatobiliary: No focal liver abnormality is seen. Small calcified gallstones present. No gallbladder wall thickening, or biliary dilatation. Pancreas: Unremarkable. No pancreatic ductal dilatation or surrounding inflammatory changes. Spleen: Normal in size without focal abnormality. Adrenals/Urinary Tract: No adrenal masses. Atrophic kidneys consistent with known end-stage renal disease. Stomach/Bowel: Positive for acute bleeding from the level of a right-sided colonic diverticulum near the hepatic flexure on the arterial phase of imaging into the colonic lumen with further accumulation of extravasated contrast on the venous phase of imaging. No other source of GI bleed identified on the CTA. Diverticulosis of the colon is primarily right-sided  and extending into the transverse colon. No evidence of bowel obstruction, focal visible bowel lesion or free intraperitoneal air. Lymphatic: No enlarged abdominal or pelvic lymph nodes. Reproductive: Uterus and bilateral adnexa are unremarkable. Other: No abdominal wall hernia or abnormality. No abdominopelvic ascites. Musculoskeletal: No acute or significant osseous findings. IMPRESSION: 1. Positive for acute bleeding from the level of a right-sided colonic diverticulum near the hepatic flexure with further accumulation of extravasated contrast on the venous phase of imaging. No other source of GI bleed identified on the CTA. 2. 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 3. Atrophic kidneys consistent with known end-stage renal disease. 4. Cholelithiasis. 5. These results were called by telephone at the time of interpretation on 09/06/2020 at 1055 hrs to provider Marcene Brawn, PA-C, who verbally acknowledged these results. Electronically Signed   By: Aletta Edouard M.D.   On: 09/06/2020 11:01    ASSESMENT:   *   Cute, painless hematochezia.  09/06/2020 CTAP with angio shows acute bleeding near hepatic flexure. 09/06/20 with Gelfoam and coil embolization of the distal right colic artery.  *   Blood loss anemia. Hgb 10.5 >> 6.4 >> 7.6.  Received 1 PRBC thus far.  *    thrombocytopenia, mild, noncritical.  Platelets 213 >> 148  *    ESRD.  HD MWF.  Missed Monday's dialysis session and plan is for her to go up for dialysis this afternoon.  *    hyperkalemia, potassium 6.3.   PLAN   *    Keep patient hospital at least 1 more night.   Recheck CBC at 5 PM this evening and 5 AM tomorrow.    Azucena Freed  09/07/2020, 11:45 AM Phone [phone]   Attending physician's note   I have taken an interval history, reviewed the chart and examined the patient. I agree with the Advanced Practitioner's note, impression and recommendations.    S/p coil embolization of distal right colic artery for acute diverticular bleed  No further episodes of hematochezia  GI will sign off, please call with any questions   The patient was provided an opportunity to ask questions and all were answered. The patient agreed with the plan and demonstrated an understanding of the instructions.  Damaris Hippo , MD [phone]

## 2020-09-07 NOTE — Progress Notes (Addendum)
   Subjective/Interm history: Hemoglobin dropped overnight from 10>6.4. Received one unit of blood. CTA was repeated and did not show active bleed. On rounds today, she denies further bloody bowel movements since yesterday.  Objective:  Vital signs in last 24 hours: Vitals:   09/07/20 0241 09/07/20 0256 09/07/20 0357 09/07/20 0521  BP: (!) 158/80 (!) 150/70 (!) 158/80 (!) 148/72  Pulse: 74 74 74 73  Resp: 18 17 20 15   Temp: 98.7 F (37.1 C) 98.7 F (37.1 C) 98.4 F (36.9 C) 98.1 F (36.7 C)  TempSrc: Oral  Oral Oral  SpO2: 96% 96% 96% 95%  Height:        Physical Exam Constitutional:      General: She is not in acute distress. HENT:     Head: Normocephalic.  Eyes:     General:        Right eye: No discharge.        Left eye: No discharge.  Abdominal:     General: There is no distension.     Palpations: Abdomen is soft.     Tenderness: There is no abdominal tenderness. There is no guarding.  Musculoskeletal:     Comments: Palpable thrill of left arm fistula  Skin:    General: Skin is warm.  Neurological:     Mental Status: She is alert.  Psychiatric:        Mood and Affect: Mood normal.    Assessment/Plan:  Principal Problem:   Diverticular hemorrhage Active Problems:   CKD (chronic kidney disease) stage V requiring chronic dialysis (HCC)   HTN (hypertension)   HLD (hyperlipidemia)  Acute blood loss anemia secondary to diverticular GI bleed. Hemodynamically stable. -s/p IR embolization to right colic artery on 8/67 -s/p 1U PRBC transfusion overnight for hgb 6.4. repeat CTA overnight negative for active hemorrhage. Post transfusion h/h 7.4 Plan -continue to monitor hemoglobin. Transfusion goal <7  -ok to resume renal diet  #ESRD on HD. K 6.3 this morning. HD today per nephrology.  #Hypertension: hemodynamically stable. Ok to continue amlodipine, hydralazine, and lopressor.  #Cholelithiasis. Incidentally noted on abdominal scan.   #Endometrial  thickening (64mm). Recommend outpatient follow up with OB for further evaluation.   Best practice Code: FULL VTE prophylaxis: hold for active GI bleed, SCD Disposition: 1-2 days pending hemoglobin stabilization   Mitzi Hansen, MD Internal Medicine Resident PGY-2 [facility] Pager: [phone] After 5pm on weekdays and 1pm on weekends: On Call pager [phone] 09/07/2020 11:23 AM

## 2020-09-07 NOTE — Progress Notes (Addendum)
Paged by RN that patient's hemoglobin level of 6.9 and potassium of 6.3.  Patient with history of elevated potassium levels per nephrology note, baseline around 6.0.  Patient receiving dialysis tomorrow morning.  Patient is status post superior mesenteric angiogram, right colic arteriogram, distal right colic Gelfoam and colic embolization.  Hemoglobin yesterday 10, appears baseline is between 10 and 11.  Patient evaluated at bedside.  She denies any acute complaints or signs of bleeding.  Denies any worsening pain surgical incision sites or any rectal bleeding.  Patient is hemodynamically stable.  Denies any shortness of breath.  Per IR note, estimated blood loss of under 5 mL.  With acute drop from 10 to 6.9, we will repeat H&H and start process for anticipation of transfusion of 1 unit of PRBCs.  Addendum 2220 Patient's repeat hemoglobin 7.2.  Will repeat at 0100.  Stable or increasing will hold off on transfusion.  Hemoglobin drops low 7 will transfuse 1 unit of PRBC.  Repeat potassium 6.2.  Addendum 0200 Repeat H&H with hemoglobin of 6.4.  Will give 1 unit PRBCs.  We will have posttransfusion H&H ordered.  Evaluated at bedside.  Patient denies any acute complaints.  She remains hemodynamically stable.  Abdomen soft nontender nondistended.  Surgical site clean dry and intact with bandage in place.  No hematoma appreciated.  Nursing staff note patient did have bloody bowel movement prior to repeat CBC.  Discussed case with interventional radiology provider on-call, recommended CT angiogram abdomen to ensure patient does not continue to bleed.  If does have active bleeding, recommended calling GI as patient may need direct embolization.

## 2020-09-07 NOTE — Progress Notes (Addendum)
  Date: 09/07/2020  Patient name: McBride record number: [id number]  Date of birth: 08-03-1945   I have seen and evaluated Marylyn Ishihara and discussed their care with the Residency Team. Briefly, Ms. Friel presented with an acute diverticular bleed.  She underwent IR embolization of distal branch of the right colic artery on 3/21.  Her Hgb remained low and she received blood transfusion this morning and is due for HD today. When seen on rounds, she felt better and was hungry, requesting food for the day.   Vitals:   09/07/20 1530 09/07/20 1600  BP: (!) 126/58 (!) 103/38  Pulse:    Resp: 19 (!) 22  Temp:    SpO2:     Gen: Elderly woman, lying in bed, Teacher, adult education used for communication Eyes: + conjunctival pallor, no injection HENT: Neck supple, MMM CV: RR, NR, no murmur noted, no peripheral edema Pulm: CTAB, no wheezing Abd: Soft, NT, ND MSK: normal tone and bulk, she has a left sided HD site which has a good thrill and bruit Psych: Pleasant, no distress, normal mood  Assessment and Plan: I have seen and evaluated the patient as outlined above. I agree with the formulated Assessment and Plan as detailed in the residents' note, with the following changes:   1. Acute blood loss anemia 2/2 diverticular bleeding s/p IR embolization 4/18 - Post transfusion H/H 7.6 - Trend CBC, if further drop or change in hemodynamic status, would repeat CTA of the abdomen - Transfuse for Hgb < 7  2. ESRD on HD - K 6.3 --> plan for dialysis today  Other issues per Dr. Chase Picket daily note.   Sid Falcon, MD 4/19/20224:51 PM

## 2020-09-08 LAB — CBC
HCT: 21.2 % — ABNORMAL LOW (ref 36.0–46.0)
HCT: 27.2 % — ABNORMAL LOW (ref 36.0–46.0)
Hemoglobin: 6.7 g/dL — CL (ref 12.0–15.0)
Hemoglobin: 9.1 g/dL — ABNORMAL LOW (ref 12.0–15.0)
MCH: 28.3 pg (ref 26.0–34.0)
MCH: 29.3 pg (ref 26.0–34.0)
MCHC: 31.6 g/dL (ref 30.0–36.0)
MCHC: 33.5 g/dL (ref 30.0–36.0)
MCV: 89.5 fL (ref 80.0–100.0)
MCV: 87.5 fL (ref 80.0–100.0)
Platelets: 131 10*3/uL — ABNORMAL LOW (ref 150–400)
Platelets: 130 10*3/uL — ABNORMAL LOW (ref 150–400)
RBC: 2.37 MIL/uL — ABNORMAL LOW (ref 3.87–5.11)
RBC: 3.11 MIL/uL — ABNORMAL LOW (ref 3.87–5.11)
RDW: 18 % — ABNORMAL HIGH (ref 11.5–15.5)
RDW: 17.2 % — ABNORMAL HIGH (ref 11.5–15.5)
WBC: 8.6 10*3/uL (ref 4.0–10.5)
WBC: 8.7 10*3/uL (ref 4.0–10.5)
nRBC: 0.5 % — ABNORMAL HIGH (ref 0.0–0.2)
nRBC: 0.5 % — ABNORMAL HIGH (ref 0.0–0.2)

## 2020-09-08 LAB — BASIC METABOLIC PANEL
Anion gap: 12 (ref 5–15)
BUN: 18 mg/dL (ref 8–23)
CO2: 27 mmol/L (ref 22–32)
Calcium: 8.2 mg/dL — ABNORMAL LOW (ref 8.9–10.3)
Chloride: 96 mmol/L — ABNORMAL LOW (ref 98–111)
Creatinine, Ser: 6.2 mg/dL — ABNORMAL HIGH (ref 0.44–1.00)
GFR, Estimated: 7 mL/min — ABNORMAL LOW (ref >60)
Glucose, Bld: 98 mg/dL (ref 70–99)
Potassium: 4.4 mmol/L (ref 3.5–5.1)
Sodium: 135 mmol/L (ref 135–145)

## 2020-09-08 LAB — PREPARE RBC (CROSSMATCH)

## 2020-09-08 MED ORDER — SODIUM CHLORIDE 0.9% IV SOLUTION: Freq: Once | INTRAVENOUS | Status: DC

## 2020-09-08 MED ORDER — RAMELTEON 8 MG PO TABS
8.0000 mg | ORAL_TABLET | Freq: Every day | ORAL | Status: DC
  Administered 2020-09-08: 8 mg via ORAL
  Filled 2020-09-08 (×2): qty 1

## 2020-09-08 MED ORDER — MELATONIN 5 MG PO TABS
10.0000 mg | ORAL_TABLET | Freq: Every evening | ORAL | Status: DC | PRN
  Filled 2020-09-08: qty 2

## 2020-09-08 NOTE — Progress Notes (Signed)
HD#1 Subjective:  Overnight Events: no event   Hgb dropped to 6.7 this AM. Patient received 1 u of pRBC. Patient is seen at HD today. States that she is feeling well. Reports 1 episode of melena overnight and none this morning. Denies abdominal pain.   Objective:  Vital signs in last 24 hours: Vitals:   09/07/20 1704 09/07/20 2034 09/07/20 2344 09/08/20 0435  BP: (!) 146/53 (!) 160/64 (!) 141/52 (!) 151/64  Pulse: 87 97 79 76  Resp: 17 15 17 15   Temp: 97.6 F (36.4 C) 98.3 F (36.8 C) 98.5 F (36.9 C) 98.8 F (37.1 C)  TempSrc: Oral Oral Oral Oral  SpO2: 94% 96% 97% 96%  Weight: 71.4 kg     Height:       Supplemental O2: Room Air SpO2: 96 % O2 Flow Rate (L/min): 2 L/min   Physical Exam:  Physical Exam Constitutional:      General: She is not in acute distress. Eyes:     General:        Right eye: No discharge.        Left eye: No discharge.  Pulmonary:     Effort: Pulmonary effort is normal. No respiratory distress.  Abdominal:     General: There is no distension.     Tenderness: There is no abdominal tenderness. There is no guarding.  Skin:    General: Skin is warm.  Neurological:     Mental Status: She is alert.  Psychiatric:        Mood and Affect: Mood normal.     Filed Weights   09/07/20 1239 09/07/20 1630 09/07/20 1704  Weight: 71.7 kg 71.4 kg 71.4 kg     Intake/Output Summary (Last 24 hours) at 09/08/2020 0828 Last data filed at 09/07/2020 1811 Gross per 24 hour  Intake 360 ml  Output 302 ml  Net 58 ml   Net IO Since Admission: 641.83 mL [09/08/20 0828]  Pertinent Labs: CBC Latest Ref Rng & Units 09/08/2020 09/07/2020 09/07/2020  WBC 4.0 - 10.5 K/uL 8.6 13.3(H) 9.0  Hemoglobin 12.0 - 15.0 g/dL 6.7(LL) 7.5(L) 7.6(L)  Hematocrit 36.0 - 46.0 % 21.2(L) 22.4(L) 23.4(L)  Platelets 150 - 400 K/uL 131(L) 132(L) 148(L)    CMP Latest Ref Rng & Units 09/07/2020 09/06/2020 09/06/2020  Glucose 70 - 99 mg/dL 95 - -  BUN 8 - 23 mg/dL 58(H) - -   Creatinine 0.44 - 1.00 mg/dL 11.53(H) - -  Sodium 135 - 145 mmol/L 136 - -  Potassium 3.5 - 5.1 mmol/L 6.3(HH) 6.2(H) 6.3(HH)  Chloride 98 - 111 mmol/L 100 - -  CO2 22 - 32 mmol/L 24 - -  Calcium 8.9 - 10.3 mg/dL 8.6(L) - -  Total Protein 6.5 - 8.1 g/dL - - -  Total Bilirubin 0.3 - 1.2 mg/dL - - -  Alkaline Phos 38 - 126 U/L - - -  AST 15 - 41 U/L - - -  ALT 0 - 44 U/L - - -    Imaging: No results found.  Assessment/Plan:   Principal Problem:   Diverticular hemorrhage Active Problems:   CKD (chronic kidney disease) stage V requiring chronic dialysis (HCC)   HTN (hypertension)   HLD (hyperlipidemia)   GI bleed   Patient Summary: Alexa Dougherty is a 75 y/o woman with history of ESRD on HD MWW, HTN, HLD who presents after several episodes of BRBPR, found to have an acute diverticular bleed s/p IR embolization.   Diverticular  bleed s/p embolization to right colic a. 8/71 Hgb dropped to 6.7 this morning from 7.5. Low suspicion of new bleed however the chance of rebleeding from diverticular is high. Patient received 1u of pRBC with HD this AM. Will continue to monitor Hgb every 12h. - F/u post transfusion H&H - CBC q12h. Transfuse if Hgb < 7 - If Hgb drops again, will obtain CTA - Patient receive ESA with HD, next dose 4/25  ESRD on HD MWF HD today. Hyperkalemia resolved.  - Appreciate nephrology recommendation  - Continue Renvela and Sensipar per nephrology  HTN -continue home lopressor, amlodipine, and hydralazine  HLD -continue home statin  Endometrial thickening (67mm) - Recommend outpatient follow up with OB for further evaluation.   Diet: Renal IVF: None,None VTE: SCDs Code: Full PT/OT recs: Pending, none. TOC recs:   Dispo: Anticipated discharge in 2 days pending Hgb stabilization.   Alexa Gerold, DO 09/08/2020, 8:28 AM Pager: [phone]  Please contact the on call pager after 5 pm and on weekends at [phone].

## 2020-09-08 NOTE — Progress Notes (Signed)
  Hemoglobin 6.7. Patient at HD.  Dr. Alfonse Spruce and HD RN made aware.

## 2020-09-08 NOTE — Progress Notes (Signed)
[facility] Progress Note   Subjective:   Yesterday on HD pt became significantly hypotensive, unable to tolerate UF and needed albumin for BP support. Feeling much better today, now moderately hypertensive. No SOB, CP, palpitations, dizziness, abdominal pain or nausea. Hgb 6.7, currently receiving 1 unit PRBC with HD.   Objective Vitals:   09/08/20 0838 09/08/20 0845 09/08/20 0900 09/08/20 0906  BP: (!) 150/64 (!) 165/70 (!) 162/72 (!) 174/75  Pulse: 72 72 73 73  Resp: (!) 23 (!) 22 (!) 24 20  Temp:    98.2 F (36.8 C)  TempSrc:    Oral  SpO2:      Weight:      Height:       Physical Exam General: WDWN female, alert and in NAD Heart: RRR, no murmurs, rubs or gallops Lungs: CTA bilaterally without wheezing, rhonchi or rales Abdomen: Soft, non-distended, +BS Extremities: No edema b/l lower extremities Dialysis Access: LUE AVF + bruit  Additional Objective Labs: Basic Metabolic Panel: Recent Labs  Lab 09/06/20 0627 09/06/20 1754 09/06/20 2149 09/07/20 0411 09/08/20 0739  NA 139  --   --  136 135  K 6.1*   < > 6.2* 6.3* 4.4  CL 102  --   --  100 96*  CO2 22  --   --  24 27  GLUCOSE 116*  --   --  95 98  BUN 50*  --   --  58* 18  CREATININE 9.63*  --   --  11.53* 6.20*  CALCIUM 9.2  --   --  8.6* 8.2*   < > = values in this interval not displayed.   Liver Function Tests: Recent Labs  Lab 09/06/20 0627  AST 31  ALT 18  ALKPHOS 115  BILITOT 0.6  PROT 7.6  ALBUMIN 3.7   CBC: Recent Labs  Lab 09/06/20 0627 09/06/20 1754 09/06/20 1951 09/07/20 0749 09/07/20 1813 09/08/20 0638  WBC 8.9 9.3  --  9.0 13.3* 8.6  NEUTROABS 7.4  --   --   --   --   --   HGB 10.5* 6.9*   < > 7.6* 7.5* 6.7*  HCT 33.0* 22.2*   < > 23.4* 22.4* 21.2*  MCV 90.2 90.6  --  88.0 85.5 89.5  PLT 213 181  --  148* 132* 131*   < > = values in this interval not displayed.   Blood Culture    Component Value Date/Time   SDES BLOOD RIGHT HAND 09/09/2018 0136    SPECREQUEST  09/09/2018 0136    BOTTLES DRAWN AEROBIC AND ANAEROBIC Blood Culture results may not be optimal due to an inadequate volume of blood received in culture bottles   CULT  09/09/2018 0136    NO GROWTH 5 DAYS Performed at [facility], [place] [address] [place], [place] [address]    REPTSTATUS 09/14/2018 FINAL 09/09/2018 0136   CBG: Recent Labs  Lab 09/06/20 1231  GLUCAP 119*    Studies/Results: IR Angiogram Visceral Selective  Result Date: 09/06/2020 INDICATION: 75 year old female with end-stage renal disease presenting with hematochezia and CT evidence of diverticulosis and acute hemorrhage from the ascending colon near the hepatic flexure. EXAM: 1. Ultrasound-guided vascular access of the right common femoral artery 2. Selective catheterization of the superior mesenteric artery 3. Selective catheterization of the right colic artery 4. Sub selective catheterization of distal branch of right colic artery. 5. Gel-Foam and coil embolization of distal branch of right colic artery. 6.  Deployment of closure device in the right common femoral artery MEDICATIONS: None. ANESTHESIA/SEDATION: Moderate (conscious) sedation was employed during this procedure. A total of Versed 0.5 mg and Fentanyl 50 mcg was administered intravenously. Moderate Sedation Time: 88 minutes. The patient's level of consciousness and vital signs were monitored continuously by radiology nursing throughout the procedure under my direct supervision. CONTRAST:  34m OMNIPAQUE IOHEXOL 300 MG/ML SOLN, 219mOMNIPAQUE IOHEXOL 300 MG/ML SOLN FLUOROSCOPY TIME:  Fluoroscopy Time: 22 minutes 0 seconds (500 mGy). COMPLICATIONS: None immediate. PROCEDURE: Informed consent was obtained from the patient following explanation of the procedure, risks, benefits and alternatives. The patient understands, agrees and consents for the procedure. All questions were addressed. A time out was performed prior to the initiation of the procedure.  Maximal barrier sterile technique utilized including caps, mask, sterile gowns, sterile gloves, large sterile drape, hand hygiene, and Betadine prep. The right groin was prepped and draped in standard fashion. Preprocedure ultrasound evaluation demonstrated widely patent right common femoral artery. The procedure was planned. Subdermal Local anesthesia was provided at the planned needle entry site. A small skin nick was made. Under direct ultrasound visualization, the right common femoral artery was accessed with a 21 gauge micropuncture needle. An image was captured and stored in a permanent record. The micropuncture sheath was inserted and limited right lower extremity angiogram was performed which demonstrated adequate puncture site for arterial closure device deployment. A J wire was inserted through the micropuncture sheath and directed to the level of the common iliac artery where some resistance with advancement was met. Therefore the 5 [place] cm vascular sheath was placed. A Kumpe the catheter was then used to help guide the J wire to the distal abdominal aorta. The Kumpe catheter was exchanged for a 5 French C2 and under direct fluoroscopic guidance the superior mesenteric artery was selected. Superior mesenteric angiogram was then performed. The superior mesenteric artery is widely patent. A focus of active extravasation was visualized arising from a distal branch of the right colic artery. A straight Lantern microcatheter and 0.014 inch soft synchro wire were then inserted and directed into the proximal right colic artery. Repeat angiogram was performed which demonstrated patent right colic artery with similar appearing focal active extravasation on the antimesenteric aspect of the distal ascending colon. Sub selective catheterization was attempted, however the lantern microcatheter would not track along the tortuous right colic artery. Therefore, the catheter was removed and a 2.0 [place]  alpha microcatheter and 0.014 inch soft synchro wire were directed to the distal right colic artery. 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 Given the anti mesenteric aspect of the hemorrhage, 0.5 mL of dilute Gel-Foam slurry were slowly administered under fluoroscopic guidance. Next, a total of 3 microcoils were deployed in the distal branch of the right colic artery. The catheter was slightly retracted into the mid right colic artery and repeat, completion angiogram was performed. Completion angiogram demonstrated adequate pruning of a small segment of the distal ascending colon with resolution of previously visualized active extravasation. The microcatheter and 5 French base catheter removed. The arteriotomy was closed with a 6 French Angio-Seal device without complication. The patient tolerated the procedure well was transferred to the floor in stable condition. IMPRESSION: 1. 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 2. Successful Gel-Foam and coil embolization a terminal common distal branch of the  right colic artery. Ruthann Cancer, MD [facility] Radiology Electronically Signed   By: Ruthann Cancer MD   On: 09/06/2020 16:49   IR Angiogram Selective Each Additional Vessel  Result Date: 09/06/2020 INDICATION: 75 year old female with end-stage renal disease presenting with hematochezia and CT evidence of diverticulosis and acute hemorrhage from the ascending colon near the hepatic flexure. EXAM: 1. Ultrasound-guided vascular access of the right common femoral artery 2. Selective catheterization of the superior mesenteric artery 3. Selective catheterization of the right colic artery 4. Sub selective catheterization of distal branch of  right colic artery. 5. Gel-Foam and coil embolization of distal branch of right colic artery. 6. Deployment of closure device in the right common femoral artery MEDICATIONS: None. ANESTHESIA/SEDATION: Moderate (conscious) sedation was employed during this procedure. A total of Versed 0.5 mg and Fentanyl 50 mcg was administered intravenously. Moderate Sedation Time: 88 minutes. The patient's level of consciousness and vital signs were monitored continuously by radiology nursing throughout the procedure under my direct supervision. CONTRAST:  60m OMNIPAQUE IOHEXOL 300 MG/ML SOLN, 243mOMNIPAQUE IOHEXOL 300 MG/ML SOLN FLUOROSCOPY TIME:  Fluoroscopy Time: 22 minutes 0 seconds (500 mGy). COMPLICATIONS: None immediate. PROCEDURE: Informed consent was obtained from the patient following explanation of the procedure, risks, benefits and alternatives. The patient understands, agrees and consents for the procedure. All questions were addressed. A time out was performed prior to the initiation of the procedure. Maximal barrier sterile technique utilized including caps, mask, sterile gowns, sterile gloves, large sterile drape, hand hygiene, and Betadine prep. The right groin was prepped and draped in standard fashion. Preprocedure ultrasound evaluation demonstrated widely patent right common femoral artery. The procedure was planned. Subdermal Local anesthesia was provided at the planned needle entry site. A small skin nick was made. Under direct ultrasound visualization, the right common femoral artery was accessed with a 21 gauge micropuncture needle. An image was captured and stored in a permanent record. The micropuncture sheath was inserted and limited right lower extremity angiogram was performed which demonstrated adequate puncture site for arterial closure device deployment. A J wire was inserted through the micropuncture sheath and directed to the level of the common iliac artery where some resistance with advancement  was met. Therefore the 5 [place] cm vascular sheath was placed. A Kumpe the catheter was then used to help guide the J wire to the distal abdominal aorta. The Kumpe catheter was exchanged for a 5 French C2 and under direct fluoroscopic guidance the superior mesenteric artery was selected. Superior mesenteric angiogram was then performed. The superior mesenteric artery is widely patent. A focus of active extravasation was visualized arising from a distal branch of the right colic artery. A straight Lantern microcatheter and 0.014 inch soft synchro wire were then inserted and directed into the proximal right colic artery. Repeat angiogram was performed which demonstrated patent right colic artery with similar appearing focal active extravasation on the antimesenteric aspect of the distal ascending colon. Sub selective catheterization was attempted, however the lantern microcatheter would not track along the tortuous right colic artery. Therefore, the catheter was removed and a 2.0 Fr[place] alpha microcatheter and 0.014 inch soft synchro wire were directed to the distal right colic artery. 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 Given the anti mesenteric aspect of the hemorrhage, 0.5 mL of dilute Gel-Foam slurry were slowly administered under fluoroscopic guidance. Next, a total of 3 microcoils were  deployed in the distal branch of the right colic artery. The catheter was slightly retracted into the mid right colic artery and repeat, completion angiogram was performed. Completion angiogram demonstrated adequate pruning of a small segment of the distal ascending colon with resolution of previously visualized active extravasation. The microcatheter and 5 French base catheter removed. The arteriotomy was closed with a 6 French Angio-Seal device without complication. The patient tolerated  the procedure well was transferred to the floor in stable condition. IMPRESSION: 1. 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 2. Successful Gel-Foam and coil embolization a terminal common distal branch of the right colic artery. Ruthann Cancer, MD [facility] Radiology Electronically Signed   By: Ruthann Cancer MD   On: 09/06/2020 16:49   IR Angiogram Selective Each Additional Vessel  Result Date: 09/06/2020 INDICATION: 75 year old female with end-stage renal disease presenting with hematochezia and CT evidence of diverticulosis and acute hemorrhage from the ascending colon near the hepatic flexure. EXAM: 1. Ultrasound-guided vascular access of the right common femoral artery 2. Selective catheterization of the superior mesenteric artery 3. Selective catheterization of the right colic artery 4. Sub selective catheterization of distal branch of right colic artery. 5. Gel-Foam and coil embolization of distal branch of right colic artery. 6. Deployment of closure device in the right common femoral artery MEDICATIONS: None. ANESTHESIA/SEDATION: Moderate (conscious) sedation was employed during this procedure. A total of Versed 0.5 mg and Fentanyl 50 mcg was administered intravenously. Moderate Sedation Time: 88 minutes. The patient's level of consciousness and vital signs were monitored continuously by radiology nursing throughout the procedure under my direct supervision. CONTRAST:  71m OMNIPAQUE IOHEXOL 300 MG/ML SOLN, 279mOMNIPAQUE IOHEXOL 300 MG/ML SOLN FLUOROSCOPY TIME:  Fluoroscopy Time: 22 minutes 0 seconds (500 mGy). COMPLICATIONS: None immediate. PROCEDURE: Informed consent was obtained from the patient following explanation of the procedure, risks, benefits and alternatives. The patient understands, agrees and consents for the procedure. All questions  were addressed. A time out was performed prior to the initiation of the procedure. Maximal barrier sterile technique utilized including caps, mask, sterile gowns, sterile gloves, large sterile drape, hand hygiene, and Betadine prep. The right groin was prepped and draped in standard fashion. Preprocedure ultrasound evaluation demonstrated widely patent right common femoral artery. The procedure was planned. Subdermal Local anesthesia was provided at the planned needle entry site. A small skin nick was made. Under direct ultrasound visualization, the right common femoral artery was accessed with a 21 gauge micropuncture needle. An image was captured and stored in a permanent record. The micropuncture sheath was inserted and limited right lower extremity angiogram was performed which demonstrated adequate puncture site for arterial closure device deployment. A J wire was inserted through the micropuncture sheath and directed to the level of the common iliac artery where some resistance with advancement was met. Therefore the 5 [place]11 cm vascular sheath was placed. A Kumpe the catheter was then used to help guide the J wire to the distal abdominal aorta. The Kumpe catheter was exchanged for a 5 French C2 and under direct fluoroscopic guidance the superior mesenteric artery was selected. Superior mesenteric angiogram was then performed. The superior mesenteric artery is widely patent. A focus of active extravasation was visualized arising from a distal branch of the right colic artery. A straight Lantern microcatheter and 0.014 inch soft synchro wire were then inserted and directed into the proximal right colic  artery. Repeat angiogram was performed which demonstrated patent right colic artery with similar appearing focal active extravasation on the antimesenteric aspect of the distal ascending colon. Sub selective catheterization was attempted, however the lantern microcatheter would not track along the tortuous  right colic artery. Therefore, the catheter was removed and a 2.0 [place] Progreat alpha microcatheter and 0.014 inch soft synchro wire were directed to the distal right colic artery. 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 Given the anti mesenteric aspect of the hemorrhage, 0.5 mL of dilute Gel-Foam slurry were slowly administered under fluoroscopic guidance. Next, a total of 3 microcoils were deployed in the distal branch of the right colic artery. The catheter was slightly retracted into the mid right colic artery and repeat, completion angiogram was performed. Completion angiogram demonstrated adequate pruning of a small segment of the distal ascending colon with resolution of previously visualized active extravasation. The microcatheter and 5 French base catheter removed. The arteriotomy was closed with a 6 French Angio-Seal device without complication. The patient tolerated the procedure well was transferred to the floor in stable condition. IMPRESSION: 1. 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 2. Successful Gel-Foam and coil embolization a terminal common distal branch of the right colic artery. Ruthann Cancer, MD [facility] Radiology Electronically Signed   By: Ruthann Cancer MD   On: 09/06/2020 16:49   IR Angiogram Selective Each Additional Vessel  Result Date: 09/06/2020 INDICATION: 75 year old female with end-stage renal disease presenting with hematochezia and CT evidence of diverticulosis and acute hemorrhage from the ascending colon near the hepatic flexure. EXAM: 1. Ultrasound-guided vascular access of the right common femoral artery 2. Selective catheterization of the superior mesenteric artery 3. Selective  catheterization of the right colic artery 4. Sub selective catheterization of distal branch of right colic artery. 5. Gel-Foam and coil embolization of distal branch of right colic artery. 6. Deployment of closure device in the right common femoral artery MEDICATIONS: None. ANESTHESIA/SEDATION: Moderate (conscious) sedation was employed during this procedure. A total of Versed 0.5 mg and Fentanyl 50 mcg was administered intravenously. Moderate Sedation Time: 88 minutes. The patient's level of consciousness and vital signs were monitored continuously by radiology nursing throughout the procedure under my direct supervision. CONTRAST:  28m OMNIPAQUE IOHEXOL 300 MG/ML SOLN, 269mOMNIPAQUE IOHEXOL 300 MG/ML SOLN FLUOROSCOPY TIME:  Fluoroscopy Time: 22 minutes 0 seconds (500 mGy). COMPLICATIONS: None immediate. PROCEDURE: Informed consent was obtained from the patient following explanation of the procedure, risks, benefits and alternatives. The patient understands, agrees and consents for the procedure. All questions were addressed. A time out was performed prior to the initiation of the procedure. Maximal barrier sterile technique utilized including caps, mask, sterile gowns, sterile gloves, large sterile drape, hand hygiene, and Betadine prep. The right groin was prepped and draped in standard fashion. Preprocedure ultrasound evaluation demonstrated widely patent right common femoral artery. The procedure was planned. Subdermal Local anesthesia was provided at the planned needle entry site. A small skin nick was made. Under direct ultrasound visualization, the right common femoral artery was accessed with a 21 gauge micropuncture needle. An image was captured and stored in a permanent record. The micropuncture sheath was inserted and limited right lower extremity angiogram was performed which demonstrated adequate puncture site for arterial closure device deployment. A J wire was inserted through the micropuncture  sheath and directed to the level of the common iliac artery where some resistance with advancement was met. Therefore the 5 [place], 11 cm vascular sheath was placed. A Kumpe the catheter was then used to help guide the J wire to the distal abdominal aorta. The Kumpe catheter was exchanged for a 5 French C2 and under direct fluoroscopic guidance the superior mesenteric artery was selected. Superior mesenteric angiogram was then performed. The superior mesenteric artery is widely patent. A focus of active extravasation was visualized arising from a distal branch of the right colic artery. A straight Lantern microcatheter and 0.014 inch soft synchro wire were then inserted and directed into the proximal right colic artery. Repeat angiogram was performed which demonstrated patent right colic artery with similar appearing focal active extravasation on the antimesenteric aspect of the distal ascending colon. Sub selective catheterization was attempted, however the lantern microcatheter would not track along the tortuous right colic artery. Therefore, the catheter was removed and a 2.0 [place] Progreat alpha microcatheter and 0.014 inch soft synchro wire were directed to the distal right colic artery. 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 Given the anti mesenteric aspect of the hemorrhage, 0.5 mL of dilute Gel-Foam slurry were slowly administered under fluoroscopic guidance. Next, a total of 3 microcoils were deployed in the distal branch of the right colic artery. The catheter was slightly retracted into the mid right colic artery and repeat, completion angiogram was performed. Completion angiogram demonstrated adequate pruning of a small segment of the distal ascending colon with resolution of previously visualized active extravasation. The microcatheter and 5 French base catheter removed. The  arteriotomy was closed with a 6 French Angio-Seal device without complication. The patient tolerated the procedure well was transferred to the floor in stable condition. IMPRESSION: 1. 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 2. Successful Gel-Foam and coil embolization a terminal common distal branch of the right colic artery. Ruthann Cancer, MD [facility] Radiology Electronically Signed   By: Ruthann Cancer MD   On: 09/06/2020 16:49   IR US Guide Vasc Access Right  Result Date: 09/06/2020 INDICATION: 75 year old female with end-stage renal disease presenting with hematochezia and CT evidence of diverticulosis and acute hemorrhage from the ascending colon near the hepatic flexure. EXAM: 1. Ultrasound-guided vascular access of the right common femoral artery 2. Selective catheterization of the superior mesenteric artery 3. Selective catheterization of the right colic artery 4. Sub selective catheterization of distal branch of right colic artery. 5. Gel-Foam and coil embolization of distal branch of right colic artery. 6. Deployment of closure device in the right common femoral artery MEDICATIONS: None. ANESTHESIA/SEDATION: Moderate (conscious) sedation was employed during this procedure. A total of Versed 0.5 mg and Fentanyl 50 mcg was administered intravenously. Moderate Sedation Time: 88 minutes. The patient's level of consciousness and vital signs were monitored continuously by radiology nursing throughout the procedure under my direct supervision. CONTRAST:  15m OMNIPAQUE IOHEXOL 300 MG/ML SOLN, 251mOMNIPAQUE IOHEXOL 300 MG/ML SOLN FLUOROSCOPY TIME:  Fluoroscopy Time: 22 minutes 0 seconds (500 mGy). COMPLICATIONS: None immediate. PROCEDURE: Informed consent was obtained from the patient following explanation of the procedure, risks, benefits and  alternatives. The patient understands, agrees and consents for the procedure. All questions were addressed. A time out was performed prior to the initiation of the procedure. Maximal barrier sterile  technique utilized including caps, mask, sterile gowns, sterile gloves, large sterile drape, hand hygiene, and Betadine prep. The right groin was prepped and draped in standard fashion. Preprocedure ultrasound evaluation demonstrated widely patent right common femoral artery. The procedure was planned. Subdermal Local anesthesia was provided at the planned needle entry site. A small skin nick was made. Under direct ultrasound visualization, the right common femoral artery was accessed with a 21 gauge micropuncture needle. An image was captured and stored in a permanent record. The micropuncture sheath was inserted and limited right lower extremity angiogram was performed which demonstrated adequate puncture site for arterial closure device deployment. A J wire was inserted through the micropuncture sheath and directed to the level of the common iliac artery where some resistance with advancement was met. Therefore the 5 [place], 11 cm vascular sheath was placed. A Kumpe the catheter was then used to help guide the J wire to the distal abdominal aorta. The Kumpe catheter was exchanged for a 5 French C2 and under direct fluoroscopic guidance the superior mesenteric artery was selected. Superior mesenteric angiogram was then performed. The superior mesenteric artery is widely patent. A focus of active extravasation was visualized arising from a distal branch of the right colic artery. A straight Lantern microcatheter and 0.014 inch soft synchro wire were then inserted and directed into the proximal right colic artery. Repeat angiogram was performed which demonstrated patent right colic artery with similar appearing focal active extravasation on the antimesenteric aspect of the distal ascending colon. Sub selective  catheterization was attempted, however the lantern microcatheter would not track along the tortuous right colic artery. Therefore, the catheter was removed and a 2.0 [place] Progreat alpha microcatheter and 0.014 inch soft synchro wire were directed to the distal right colic artery. 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 Given the anti mesenteric aspect of the hemorrhage, 0.5 mL of dilute Gel-Foam slurry were slowly administered under fluoroscopic guidance. Next, a total of 3 microcoils were deployed in the distal branch of the right colic artery. The catheter was slightly retracted into the mid right colic artery and repeat, completion angiogram was performed. Completion angiogram demonstrated adequate pruning of a small segment of the distal ascending colon with resolution of previously visualized active extravasation. The microcatheter and 5 French base catheter removed. The arteriotomy was closed with a 6 French Angio-Seal device without complication. The patient tolerated the procedure well was transferred to the floor in stable condition. IMPRESSION: 1. 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 2. Successful Gel-Foam and coil embolization a terminal common distal branch of the right colic artery. Ruthann Cancer, MD [facility] Electronically Signed   By: Ruthann Cancer MD   On: 09/06/2020 16:49   IR EMBO ART  VEN HEMORR LYMPH EXTRAV  INC GUIDE ROADMAPPING  Result Date: 09/06/2020 INDICATION: 75 year old female with end-stage renal disease presenting with hematochezia and CT evidence of diverticulosis and acute hemorrhage from the ascending colon near the hepatic flexure. EXAM: 1. Ultrasound-guided vascular access of the  right common femoral artery 2. Selective catheterization of the superior mesenteric artery 3. Selective catheterization of the right colic artery 4. Sub selective catheterization of distal branch of right colic artery. 5. Gel-Foam and coil embolization of distal branch of right colic artery. 6. Deployment of closure device in the right common femoral artery MEDICATIONS:  None. ANESTHESIA/SEDATION: Moderate (conscious) sedation was employed during this procedure. A total of Versed 0.5 mg and Fentanyl 50 mcg was administered intravenously. Moderate Sedation Time: 88 minutes. The patient's level of consciousness and vital signs were monitored continuously by radiology nursing throughout the procedure under my direct supervision. CONTRAST:  72m OMNIPAQUE IOHEXOL 300 MG/ML SOLN, 210mOMNIPAQUE IOHEXOL 300 MG/ML SOLN FLUOROSCOPY TIME:  Fluoroscopy Time: 22 minutes 0 seconds (500 mGy). COMPLICATIONS: None immediate. PROCEDURE: Informed consent was obtained from the patient following explanation of the procedure, risks, benefits and alternatives. The patient understands, agrees and consents for the procedure. All questions were addressed. A time out was performed prior to the initiation of the procedure. Maximal barrier sterile technique utilized including caps, mask, sterile gowns, sterile gloves, large sterile drape, hand hygiene, and Betadine prep. The right groin was prepped and draped in standard fashion. Preprocedure ultrasound evaluation demonstrated widely patent right common femoral artery. The procedure was planned. Subdermal Local anesthesia was provided at the planned needle entry site. A small skin nick was made. Under direct ultrasound visualization, the right common femoral artery was accessed with a 21 gauge micropuncture needle. An image was captured and stored in a permanent record. The micropuncture sheath was inserted and limited right lower extremity angiogram was performed which demonstrated adequate  puncture site for arterial closure device deployment. A J wire was inserted through the micropuncture sheath and directed to the level of the common iliac artery where some resistance with advancement was met. Therefore the 5 [place] cm vascular sheath was placed. A Kumpe the catheter was then used to help guide the J wire to the distal abdominal aorta. The Kumpe catheter was exchanged for a 5 French C2 and under direct fluoroscopic guidance the superior mesenteric artery was selected. Superior mesenteric angiogram was then performed. The superior mesenteric artery is widely patent. A focus of active extravasation was visualized arising from a distal branch of the right colic artery. A straight Lantern microcatheter and 0.014 inch soft synchro wire were then inserted and directed into the proximal right colic artery. Repeat angiogram was performed which demonstrated patent right colic artery with similar appearing focal active extravasation on the antimesenteric aspect of the distal ascending colon. Sub selective catheterization was attempted, however the lantern microcatheter would not track along the tortuous right colic artery. Therefore, the catheter was removed and a 2.0 [place] alpha microcatheter and 0.014 inch soft synchro wire were directed to the distal right colic artery. 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 Given the anti mesenteric aspect of the hemorrhage, 0.5 mL of dilute Gel-Foam slurry were slowly administered under fluoroscopic guidance. Next, a total of 3 microcoils were deployed in the distal branch of the right colic artery. The catheter was slightly retracted into the mid right colic artery and repeat, completion angiogram was performed. Completion angiogram demonstrated adequate pruning of a small segment of the distal ascending colon with resolution of  previously visualized active extravasation. The microcatheter and 5 French base catheter removed. The arteriotomy was closed with a 6 French Angio-Seal device without complication. The patient tolerated the procedure well was transferred to the floor in stable condition. IMPRESSION: 1. 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 2. Successful Gel-Foam and coil embolization a terminal common distal branch of the right colic artery. DyRuthann CancerMD [facility]  [facility] Electronically Signed   By: Ruthann Cancer MD   On: 09/06/2020 16:49   CT Angio Abd/Pel W and/or Wo Contrast  Result Date: 09/07/2020 CLINICAL DATA:  GI bleeding. EXAM: CTA ABDOMEN AND PELVIS WITHOUT AND WITH CONTRAST TECHNIQUE: Multidetector CT imaging of the abdomen and pelvis was performed using the standard protocol during bolus administration of intravenous contrast. Multiplanar reconstructed images and MIPs were obtained and reviewed to evaluate the vascular anatomy. CONTRAST:  34m OMNIPAQUE IOHEXOL 350 MG/ML SOLN COMPARISON:  CTA from yesterday FINDINGS: VASCULAR Aorta: No acute finding.  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 SMA: Approximately 50% atheromatous stenosis at the proximal SMA. Right colic branch embolization since prior. No active intraluminal hemorrhage or dissection seen. There is high-density within the lumen of the proximal colon which is also present on precontrast imaging and also seen in the additional small bowel loops Renals: Right more than left generalized narrow appearance in the setting of end-stage renal disease. IMA: Patent Inflow: Atheromatous iliac plaque bilaterally.  No dissection. Proximal Outflow: Unremarkable Veins: Unremarkable in the arterial phase  Review of the MIP images confirms the above findings. NON-VASCULAR Lower chest:  No contributory findings. Hepatobiliary: No focal liver abnormality.Cholelithiasis. The gallbladder is full but there is no evidence of acute wall inflammation. Pancreas: Unremarkable. Spleen: Unremarkable. Adrenals/Urinary Tract: Negative adrenals. Severe bilateral renal atrophy in the setting of end-stage renal disease. Bilateral renal cortical cystic densities. Unremarkable collapsed bladder. Stomach/Bowel: No obstruction. Multiple colonic diverticula. No recurrent intraluminal hemorrhage is noted above. Mild pericolonic fat haziness but the embolized segment enhances similar to the remaining colon on the portal venous phase. Vascular/Lymphatic: No acute vascular abnormality. No mass or adenopathy. Reproductive:7 mm generalized endometrial thickness. Other: No ascites or pneumoperitoneum. Musculoskeletal: No acute abnormalities. Spinal degeneration and scoliosis. IMPRESSION: 1. No active intraluminal hemorrhage. No new or acute vascular finding. 2. Cholelithiasis. 3. 7 mm endometrial stripe, unexpected for age. Recommend PCP follow-up. Electronically Signed   By: JMonte FantasiaM.D.   On: 09/07/2020 04:12   CT Angio Abd/Pel W and/or Wo Contrast  Result Date: 09/06/2020 CLINICAL DATA:  GI bleed, bloody diarrhea and abdominal pain. EXAM: CT ANGIOGRAPHY ABDOMEN AND PELVIS WITH CONTRAST AND WITHOUT CONTRAST TECHNIQUE: Multidetector CT imaging of the abdomen and pelvis was performed using the standard protocol during bolus administration of intravenous contrast. Multiplanar reconstructed images and MIPs were obtained and reviewed to evaluate the vascular anatomy. CONTRAST:  852mOMNIPAQUE IOHEXOL 350 MG/ML SOLN COMPARISON:  CT of the abdomen and pelvis on 03/06/2015 FINDINGS: VASCULAR Aorta: Aortic atherosclerosis with calcified plaque present. No evidence of abdominal aortic aneurysm. Celiac: Approximately 50-60% stenosis at the  origin of the celiac axis. Distal branch vessels are patent and demonstrate normal branching anatomy. SMA: Roughly 50% stenosis at the origin of the SMA. SMA trunk and branch vessels are normally patent. See below for description of colonic bleed. Renals: Small caliber and patent bilateral renal arteries. IMA: The IMA is patent. Inflow: Iliac atherosclerosis without evidence of significant obstructive disease or aneurysmal disease bilaterally. Proximal Outflow: Normally patent bilateral common femoral arteries and femoral bifurcations. Veins: Venous phase imaging demonstrates no venous abnormalities in the abdomen or pelvis. Review of the MIP images confirms the above findings. NON-VASCULAR Lower chest: No acute abnormality. Hepatobiliary: No focal liver abnormality is seen. Small calcified gallstones present. No gallbladder wall thickening, or biliary dilatation. Pancreas: Unremarkable. No pancreatic ductal dilatation or surrounding  inflammatory changes. Spleen: Normal in size without focal abnormality. Adrenals/Urinary Tract: No adrenal masses. Atrophic kidneys consistent with known end-stage renal disease. Stomach/Bowel: Positive for acute bleeding from the level of a right-sided colonic diverticulum near the hepatic flexure on the arterial phase of imaging into the colonic lumen with further accumulation of extravasated contrast on the venous phase of imaging. No other source of GI bleed identified on the CTA. Diverticulosis of the colon is primarily right-sided and extending into the transverse colon. No evidence of bowel obstruction, focal visible bowel lesion or free intraperitoneal air. Lymphatic: No enlarged abdominal or pelvic lymph nodes. Reproductive: Uterus and bilateral adnexa are unremarkable. Other: No abdominal wall hernia or abnormality. No abdominopelvic ascites. Musculoskeletal: No acute or significant osseous findings. IMPRESSION: 1. Positive for acute bleeding from the level of a right-sided  colonic diverticulum near the hepatic flexure with further accumulation of extravasated contrast on the venous phase of imaging. No other source of GI bleed identified on the CTA. 2. 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 3. Atrophic kidneys consistent with known end-stage renal disease. 4. Cholelithiasis. 5. These results were called by telephone at the time of interpretation on 09/06/2020 at 1055 hrs to provider Marcene Brawn, PA-C, who verbally acknowledged these results. Electronically Signed   By: Aletta Edouard M.D.   On: 09/06/2020 11:01   Medications:  . sodium chloride   Intravenous Once  . albuterol  10 mg Nebulization Once  . amLODipine  10 mg Oral QPM  . atorvastatin  10 mg Oral QPM  . Chlorhexidine Gluconate Cloth  6 each Topical Q0600  . cinacalcet  60 mg Oral QHS  . hydrALAZINE  25 mg Oral BID  . metoprolol tartrate  50 mg Oral BID  . pantoprazole  40 mg Oral Daily  . sevelamer carbonate  2,400 mg Oral TID WC    Outpatient Dialysis Orders: MWF HD 3h 89mn 400/500 68.5kg 2/2 bath P2 15ga LUA AVF Hep none - mircera q2wks last 4/11 - venofer 50 q wk - hect 5 ug tiw  Assessment/Plan: 1. BRBPR - started 4/17-4/18, CTA showed active bleeding and patient went for IR embolization yesterday. Hemoglobin dropped from baseline ~10 to 6-7 overnight. Receiving 1 unit PRBC with HD now. Management per GI.  2. ESRD - on HD MWF. K+ elevated on admission, received temporizing measures on 4/18 but did not have HD due to need for emergent GI procedure. Potassium now at goal after HD yesterday. Continue MWF schedule, no heparin.  3. HTN - BP elevated, does not appear volume overloaded on exam. BP dropped yesterday with minimal UF, appears euvolemic on exam today. Minimal UF goal, so far tolerating well. Continue home BP meds but hold prior to dialysis.  4. MBD ckd - cont vdra, binder and sensipar once tolerating PO 5. Anemia  ckd - Worsening anemia due to #1 above. Next esa due on 4/25 6. HL - per pmd  SAnice Paganini PA-C 09/08/2020, 9:21 AM  [facility] Pager: [phone]

## 2020-09-09 LAB — CBC
HCT: 26.9 % — ABNORMAL LOW (ref 36.0–46.0)
Hemoglobin: 8.9 g/dL — ABNORMAL LOW (ref 12.0–15.0)
MCH: 29.2 pg (ref 26.0–34.0)
MCHC: 33.1 g/dL (ref 30.0–36.0)
MCV: 88.2 fL (ref 80.0–100.0)
Platelets: 122 10*3/uL — ABNORMAL LOW (ref 150–400)
RBC: 3.05 MIL/uL — ABNORMAL LOW (ref 3.87–5.11)
RDW: 17.2 % — ABNORMAL HIGH (ref 11.5–15.5)
WBC: 7.7 10*3/uL (ref 4.0–10.5)
nRBC: 0.6 % — ABNORMAL HIGH (ref 0.0–0.2)

## 2020-09-09 LAB — TYPE AND SCREEN
ABO/RH(D): A POS
Antibody Screen: NEGATIVE
Unit division: 0
Unit division: 0

## 2020-09-09 LAB — BPAM RBC
Blood Product Expiration Date: 202205082359
Blood Product Expiration Date: 202205092359
ISSUE DATE / TIME: 202204190233
ISSUE DATE / TIME: 202204200902
Unit Type and Rh: 6200
Unit Type and Rh: 6200

## 2020-09-09 NOTE — Discharge Summary (Signed)
Name: Alexa Dougherty MRN: [id number] DOB: [birth year] 75 y.o. PCP: Bartholome Bill, MD  Date of Admission: 09/06/2020  6:14 AM Date of Discharge: 09/09/2020 Attending Physician: Dr. Daryll Drown  Discharge Diagnosis: 1. Symptomatic acute blood loss anemia on anemia of ERSD 2. Diverticular bleed 3. Endometrial thickening 4. Asymptomatic cholelithiasis   Discharge Medications: Allergies as of 09/09/2020   No Known Allergies     Medication List    TAKE these medications   acetaminophen 500 MG tablet Commonly known as: TYLENOL Take 500 mg by mouth every 6 (six) hours as needed for moderate pain or headache.   amLODipine 10 MG tablet Commonly known as: NORVASC Take 1 tablet by mouth every evening.   atorvastatin 10 MG tablet Commonly known as: LIPITOR Take 10 mg by mouth every evening.   cinacalcet 60 MG tablet Commonly known as: SENSIPAR Take 60 mg by mouth at bedtime.   dexlansoprazole 60 MG capsule Commonly known as: DEXILANT Take 60 mg by mouth daily.   hydrALAZINE 25 MG tablet Commonly known as: APRESOLINE Take 25 mg by mouth in the morning and at bedtime. What changed: Another medication with the same name was removed. Continue taking this medication, and follow the directions you see here.   lidocaine-prilocaine cream Commonly known as: EMLA Apply 1 application topically See admin instructions. Apply a small amount to access site (AVF) 1 to 2 hours before dialysis. Cover with occlusive dressing (saran warp).   metoprolol tartrate 50 MG tablet Commonly known as: LOPRESSOR Take 1 tablet (50 mg total) by mouth 2 (two) times daily.   multivitamin Tabs tablet Take 1 tablet by mouth daily.   sevelamer carbonate 800 MG tablet Commonly known as: Renvela Take 3 tablets (2,400 mg total) by mouth 3 (three) times daily with meals.            Discharge Care Instructions  (From admission, onward)         Start     Ordered   09/09/20 0000  Discharge wound  care:       Comments: Per RN   09/09/20 1023          Disposition and follow-up:   Alexa Dougherty was discharged from [facility] in Stable condition.  At the hospital follow up visit please address:  Symptomatic acute blood loss anemia secondary to diverticular bleed S/p Gelfoam and coil embolization 09/06/20 S/p 2U PRBC this admission. 4/19, 4/20 Discharge hemoglobin: 8.9 Follow up recommendations -obtain CBC  -re-evaluate for hematochezia/melena    Endometrial thickening (33mm). Incidentally noted on abdominal/pelvis CT -place referral to gynecology  Labs / imaging needed at time of follow-up: CBC  Pending labs/ test needing follow-up: none  Follow-up Appointments: PCP 3-5 days   Hospital Course:  Alexa Dougherty is a 75 year old female on dialysis who presented to [facility] ED on 09/06/20 for acute onset hematochezia that occurred earlier on the morning of admission. When she got to dialysis she had 2-3 more episodes and was sent to the ED for further evaluation before dialysis was initiated.  Last BM was 9:45 this morning and was primarily blood.  On arrival to the ED, hemoglobin was 10. She underwent CTA which showed acute bleeding from a right sided colonic diverticulum near the hepatic flexure. IR was consulted and she underwent embolization. Repeat CBC, later that night, showed that her hemoglobin dropped to 6.4 so she received a unit of PRBCs. Repeat CTA was obtained and did not reveal recurrent bleeding.  She did have melena during the hospitalization but this was likely residual.  Her hemoglobin did drop again to 6.7 on hospital day #2 so she received another unit of blood. Her hemoglobin remained stable for the remainder of the hospitalization and she did not have further hematochezia. Her symptoms had improved and was subsequently discharged on hospital day #3.  Of additional note, CT on admission revealed a thickened endometrial stripe to 73mm.  She was recommended to follow up with gynecology after discharge.   Discharge Vitals:   BP (!) 151/55 (BP Location: Right Arm)   Pulse 68   Temp 98.5 F (36.9 C) (Oral)   Resp 17   Ht 5' (1.524 m)   Wt 71 kg   SpO2 96%   BMI 30.57 kg/m   Pertinent Labs, Studies, and Procedures:  CBC Latest Ref Rng & Units 09/09/2020 09/08/2020 09/08/2020  WBC 4.0 - 10.5 K/uL 7.7 8.7 8.6  Hemoglobin 12.0 - 15.0 g/dL 8.9(L) 9.1(L) 6.7(LL)  Hematocrit 36.0 - 46.0 % 26.9(L) 27.2(L) 21.2(L)  Platelets 150 - 400 K/uL 122(L) 130(L) 131(L)   BMP Latest Ref Rng & Units 09/08/2020 09/07/2020 09/06/2020  Glucose 70 - 99 mg/dL 98 95 -  BUN 8 - 23 mg/dL 18 58(H) -  Creatinine 0.44 - 1.00 mg/dL 6.20(H) 11.53(H) -  Sodium 135 - 145 mmol/L 135 136 -  Potassium 3.5 - 5.1 mmol/L 4.4 6.3(HH) 6.2(H)  Chloride 98 - 111 mmol/L 96(L) 100 -  CO2 22 - 32 mmol/L 27 24 -  Calcium 8.9 - 10.3 mg/dL 8.2(L) 8.6(L) -     Discharge Instructions: Discharge Instructions    Call MD for:  persistant nausea and vomiting   Complete by: As directed    Call MD for:  severe uncontrolled pain   Complete by: As directed    Diet - low sodium heart healthy   Complete by: As directed    Discharge instructions   Complete by: As directed    [name],  It is a pleasure taking care of you during this admission. You were admitted due to a bleeding artery in the intestinal tract. The bleeding was stopped with an embolization procedure. You also received blood transfusion and your blood count was stable on discharge.   Please let your doctors know or go to the ED if you continue to have bloody bowel movement.  Please continue dialysis as scheduled and follow up with you nephrology.   Please follow up with your primary care doctor.   Take care,  Dr. Alfonse Spruce   Discharge wound care:   Complete by: As directed    Per RN   Increase activity slowly   Complete by: As directed       Signed:  Mitzi Hansen, MD Internal Medicine  Resident PGY-2 [facility] Pager: [phone] 09/09/2020 4:40 PM

## 2020-09-09 NOTE — Progress Notes (Signed)
Alexa Dougherty to be D/C'd Home per MD order.  Discussed with the patient and all questions fully answered.  VSS, Skin clean, dry and intact without evidence of skin break down, no evidence of skin tears noted. IV catheter discontinued intact. Site without signs and symptoms of complications. Dressing and pressure applied.  An After Visit Summary was printed and given to the patient.  D/c education completed with patient/family including follow up instructions, medication list, d/c activities limitations if indicated, with other d/c instructions as indicated by MD - patient able to verbalize understanding, all questions fully answered.   Patient instructed to return to ED, call 911, or call MD for any changes in condition.   Patient escorted via Alexa Dougherty, and D/C home via private auto.  Alexa Dougherty 09/09/2020 12:17 PM

## 2020-09-09 NOTE — Progress Notes (Incomplete)
    HD#2 Subjective:  Overnight Events: ***   ***  Objective:  Vital signs in last 24 hours: Vitals:   09/08/20 1209 09/08/20 1348 09/08/20 2130 09/09/20 0405  BP: (!) 161/68 (!) 164/56 (!) 170/61 (!) 151/55  Pulse: 79 83 75 68  Resp: 19 (!) 22 19 17   Temp: 98.9 F (37.2 C) 98 F (36.7 C) 98.6 F (37 C) 98.5 F (36.9 C)  TempSrc: Oral Oral Oral Oral  SpO2: 98% 94% 97% 96%  Weight: 71 kg     Height:       Supplemental O2: {NAMES:3044014::"Room Air","Nasal Cannula","Simple Face Mask","Partial Rebreather","HFNC","Non Rebreather","Venturi Mask","Bag Valve Mask"} SpO2: 96 % O2 Flow Rate (L/min): 2 L/min   Physical Exam:  Physical Exam  Filed Weights   09/07/20 1704 09/08/20 0835 09/08/20 1209  Weight: 71.4 kg 71.7 kg 71 kg     Intake/Output Summary (Last 24 hours) at 09/09/2020 0621 Last data filed at 09/08/2020 1900 Gross per 24 hour  Intake 360 ml  Output 500 ml  Net -140 ml   Net IO Since Admission: 501.83 mL [09/09/20 0621]  Pertinent Labs: CBC Latest Ref Rng & Units 09/08/2020 09/08/2020 09/07/2020  WBC 4.0 - 10.5 K/uL 8.7 8.6 13.3(H)  Hemoglobin 12.0 - 15.0 g/dL 9.1(L) 6.7(LL) 7.5(L)  Hematocrit 36.0 - 46.0 % 27.2(L) 21.2(L) 22.4(L)  Platelets 150 - 400 K/uL 130(L) 131(L) 132(L)    CMP Latest Ref Rng & Units 09/08/2020 09/07/2020 09/06/2020  Glucose 70 - 99 mg/dL 98 95 -  BUN 8 - 23 mg/dL 18 58(H) -  Creatinine 0.44 - 1.00 mg/dL 6.20(H) 11.53(H) -  Sodium 135 - 145 mmol/L 135 136 -  Potassium 3.5 - 5.1 mmol/L 4.4 6.3(HH) 6.2(H)  Chloride 98 - 111 mmol/L 96(L) 100 -  CO2 22 - 32 mmol/L 27 24 -  Calcium 8.9 - 10.3 mg/dL 8.2(L) 8.6(L) -  Total Protein 6.5 - 8.1 g/dL - - -  Total Bilirubin 0.3 - 1.2 mg/dL - - -  Alkaline Phos 38 - 126 U/L - - -  AST 15 - 41 U/L - - -  ALT 0 - 44 U/L - - -    Imaging: No results found.  Assessment/Plan:   Principal Problem:   Diverticular hemorrhage Active Problems:   CKD (chronic kidney disease) stage V requiring  chronic dialysis (HCC)   HTN (hypertension)   HLD (hyperlipidemia)   GI bleed   Patient Summary: Alexa Dougherty is a 75 y.o. with a pertinent PMH of ***, who presented with *** and admitted for ***.    *** ***  *** ***  *** ***  *** ***  Diet: {NAMES:3044014::"Normal","Heart Healthy","Carb-Modified","Renal","Carb/Renal","NPO","TPN","Tube Feeds"} IVF: {NAMES:3044014::"None","NS","1/2 NS","LR","D5","D10"},{NAMES:3044014::"None","10cc/hr","25cc/hr","50cc/hr","75cc/hr","100cc/hr","110cc/hr","125cc/hr","Bolus"} VTE: {NAMES:3044014::"Heparin","Enoxaparin","SCDs","NOAC","None"} Code: {NAMES:3044014::"Full","DNR","DNI","DNR/DNI","Comfort Care","Unknown"} PT/OT recs: {NAMES:3044014::"None","Pending","CIR","SNF for Subacute PT","LTAC","Home Health"}, {Assistive Devices GNO:03704}. TOC recs: ***   Dispo: Anticipated discharge to {Discharge Destination:18313::"Home"} in {NUMBERS:20191} days pending ***.   Gaylan Gerold, DO 09/09/2020, 6:21 AM Pager: [phone]  Please contact the on call pager after 5 pm and on weekends at [phone].

## 2020-09-09 NOTE — Progress Notes (Signed)
[facility] Progress Note   Subjective:   Feeling well, hoping to go home today. Denies SOB, CP, palpitations, dizziness, abdominal pain and nausea. Continues to have dark stools  Objective Vitals:   09/08/20 1209 09/08/20 1348 09/08/20 2130 09/09/20 0405  BP: (!) 161/68 (!) 164/56 (!) 170/61 (!) 151/55  Pulse: 79 83 75 68  Resp: 19 (!) 22 19 17   Temp: 98.9 F (37.2 C) 98 F (36.7 C) 98.6 F (37 C) 98.5 F (36.9 C)  TempSrc: Oral Oral Oral Oral  SpO2: 98% 94% 97% 96%  Weight: 71 kg     Height:       Physical Exam General: WDWN female, alert and in NAD Heart: RRR, no murmurs, rubs or gallops Lungs: CTA bilaterally without wheezing, rhonchi or rales Abdomen: Soft, non-distended, +BS Extremities: No edema b/l lower extremities Dialysis Access: LUE AVF + bruit  Additional Objective Labs: Basic Metabolic Panel: Recent Labs  Lab 09/06/20 0627 09/06/20 1754 09/06/20 2149 09/07/20 0411 09/08/20 0739  NA 139  --   --  136 135  K 6.1*   < > 6.2* 6.3* 4.4  CL 102  --   --  100 96*  CO2 22  --   --  24 27  GLUCOSE 116*  --   --  95 98  BUN 50*  --   --  58* 18  CREATININE 9.63*  --   --  11.53* 6.20*  CALCIUM 9.2  --   --  8.6* 8.2*   < > = values in this interval not displayed.   Liver Function Tests: Recent Labs  Lab 09/06/20 0627  AST 31  ALT 18  ALKPHOS 115  BILITOT 0.6  PROT 7.6  ALBUMIN 3.7   No results for input(s): LIPASE, AMYLASE in the last 168 hours. CBC: Recent Labs  Lab 09/06/20 0627 09/06/20 1754 09/07/20 0749 09/07/20 1813 09/08/20 0638 09/08/20 1756 09/09/20 0634  WBC 8.9   < > 9.0 13.3* 8.6 8.7 7.7  NEUTROABS 7.4  --   --   --   --   --   --   HGB 10.5*   < > 7.6* 7.5* 6.7* 9.1* 8.9*  HCT 33.0*   < > 23.4* 22.4* 21.2* 27.2* 26.9*  MCV 90.2   < > 88.0 85.5 89.5 87.5 88.2  PLT 213   < > 148* 132* 131* 130* 122*   < > = values in this interval not displayed.   Blood Culture    Component Value Date/Time   SDES BLOOD  RIGHT HAND 09/09/2018 0136   SPECREQUEST  09/09/2018 0136    BOTTLES DRAWN AEROBIC AND ANAEROBIC Blood Culture results may not be optimal due to an inadequate volume of blood received in culture bottles   CULT  09/09/2018 0136    NO GROWTH 5 DAYS Performed at [facility], [place] [address], [place], [place] [address]    REPTSTATUS 09/14/2018 FINAL 09/09/2018 0136    Cardiac Enzymes: No results for input(s): CKTOTAL, CKMB, CKMBINDEX, TROPONINI in the last 168 hours. CBG: Recent Labs  Lab 09/06/20 1231  GLUCAP 119*   Medications:  . sodium chloride   Intravenous Once  . albuterol  10 mg Nebulization Once  . amLODipine  10 mg Oral QPM  . atorvastatin  10 mg Oral QPM  . Chlorhexidine Gluconate Cloth  6 each Topical Q0600  . cinacalcet  60 mg Oral QHS  . hydrALAZINE  25 mg Oral BID  .  metoprolol tartrate  50 mg Oral BID  . pantoprazole  40 mg Oral Daily  . ramelteon  8 mg Oral QHS  . sevelamer carbonate  2,400 mg Oral TID WC    Dialysis Orders: MWF HD 3h 64min 400/500 68.5kg 2/2 bath P2 15ga LUA AVF Hep none - mircera q2wks last 4/11 - venofer 50 q wk - hect 5 ug tiw  Assessment/Plan: 1. BRBPR - started4/17-4/18,CTA showed active bleeding and patient went for IR embolization. Hemoglobin dropped from baseline ~10 to 6-7 overnight. Receiving 1 unit PRBC with HD yesterday, Hgb now 8.9. 2. ESRD - on HD MWF.K+ elevated on admission, received temporizing measures on 4/18 but did not have HD due to need for emergent GI procedure. Potassium now at goal after HD. Continue MWF schedule, no heparin.  3. HTN -BP elevated, does not appear volume overloaded on exam. BP dropped on 4/19 with minimal UF, appears euvolemic on exam today. Continue home BP meds but hold prior to dialysis.  4. MBD ckd - cont vdra, binder and sensipar 5. Anemia ckd -Worsening anemia due to #1 above. Next esa due on 4/25 6. HL - per pmd 7. Dispo: Ok to discharge from a renal  standpoint   Anice Paganini, PA-C 09/09/2020, 11:35 AM  [facility] Pager: [phone]

## 2020-09-09 NOTE — Progress Notes (Signed)
PT Cancellation Note  Patient Details Name: Alexa Dougherty MRN: [id number] DOB: 07/10/1945   Cancelled Treatment:    Reason Eval/Treat Not Completed: PT screened, no needs identified, will sign off at this time. Noted OT screen earlier this morning, and per discussion with RN, pt has been ambulating within the room without issue this morning. Reports no acute PT needs. Please feel free to re-consult if needed.   Hardie Pulley, DPT   Acute Rehabilitation Department Pager #: [phone]   Otho Bellows 09/09/2020, 11:18 AM

## 2020-09-09 NOTE — Progress Notes (Signed)
OT Cancellation Note  Patient Details Name: Alexa Dougherty MRN: [id number] DOB: 22-Feb-1946   Cancelled Treatment:    Reason Eval/Treat Not Completed: OT screened, no needs identified, will sign off Collaborated with RN prior to initiating OT eval. Per RN, pt able to ambulate to bathroom without difficulty and no safety concerns. Pt likely at baseline. No formal OT eval needed at this time.  Layla Maw 09/09/2020, 10:31 AM

## 2020-09-10 ENCOUNTER — Telehealth: Payer: Self-pay | Admitting: Physician Assistant

## 2020-09-10 NOTE — Telephone Encounter (Signed)
Transition of care contact from inpatient facility  Date of Discharge: 09/09/20 Date of Contact: 09/10/20 Method of contact: Phone  Attempted to contact patient to discuss transition of care from inpatient admission. Person who answered the phone could not hear me, attempted to call back and call did not go through. Will try again tomorrow.  Anice Paganini, PA-C 09/10/2020, 1:03 PM  [employer]

## 2020-11-15 ENCOUNTER — Emergency Department (HOSPITAL_COMMUNITY): Payer: Medicare Other

## 2020-11-15 ENCOUNTER — Encounter (HOSPITAL_COMMUNITY): Payer: Self-pay | Admitting: Internal Medicine

## 2020-11-15 ENCOUNTER — Emergency Department (HOSPITAL_COMMUNITY)
Admission: EM | Admit: 2020-11-15 | Discharge: 2020-11-15 | Disposition: A | Discharge status: Home / Self Care | Payer: Medicare Other | Attending: Emergency Medicine | Admitting: Emergency Medicine

## 2020-11-15 DIAGNOSIS — Z992 Dependence on renal dialysis: Secondary | ICD-10-CM | POA: Diagnosis not present

## 2020-11-15 DIAGNOSIS — E875 Hyperkalemia: Secondary | ICD-10-CM | POA: Diagnosis present

## 2020-11-15 DIAGNOSIS — I120 Hypertensive chronic kidney disease with stage 5 chronic kidney disease or end stage renal disease: Secondary | ICD-10-CM | POA: Insufficient documentation

## 2020-11-15 DIAGNOSIS — N186 End stage renal disease: Secondary | ICD-10-CM | POA: Diagnosis not present

## 2020-11-15 DIAGNOSIS — Z79899 Other long term (current) drug therapy: Secondary | ICD-10-CM | POA: Insufficient documentation

## 2020-11-15 DIAGNOSIS — E8770 Fluid overload, unspecified: Secondary | ICD-10-CM | POA: Diagnosis not present

## 2020-11-15 DIAGNOSIS — R0602 Shortness of breath: Secondary | ICD-10-CM | POA: Insufficient documentation

## 2020-11-15 LAB — COMPREHENSIVE METABOLIC PANEL
ALT: 16 U/L (ref 0–44)
AST: 23 U/L (ref 15–41)
Albumin: 3.7 g/dL (ref 3.5–5.0)
Alkaline Phosphatase: 126 U/L (ref 38–126)
Anion gap: 13 (ref 5–15)
BUN: 60 mg/dL — ABNORMAL HIGH (ref 8–23)
CO2: 26 mmol/L (ref 22–32)
Calcium: 10.1 mg/dL (ref 8.9–10.3)
Chloride: 93 mmol/L — ABNORMAL LOW (ref 98–111)
Creatinine, Ser: 9.45 mg/dL — ABNORMAL HIGH (ref 0.44–1.00)
GFR, Estimated: 4 mL/min — ABNORMAL LOW (ref >60)
Glucose, Bld: 86 mg/dL (ref 70–99)
Potassium: 6.9 mmol/L (ref 3.5–5.1)
Sodium: 132 mmol/L — ABNORMAL LOW (ref 135–145)
Total Bilirubin: 0.6 mg/dL (ref 0.3–1.2)
Total Protein: 7.6 g/dL (ref 6.5–8.1)

## 2020-11-15 LAB — CBC WITH DIFFERENTIAL/PLATELET
Abs Immature Granulocytes: 0.04 10*3/uL (ref 0.00–0.07)
Basophils Absolute: 0.1 10*3/uL (ref 0.0–0.1)
Basophils Relative: 1 %
Eosinophils Absolute: 0.2 10*3/uL (ref 0.0–0.5)
Eosinophils Relative: 2 %
HCT: 36 % (ref 36.0–46.0)
Hemoglobin: 11.4 g/dL — ABNORMAL LOW (ref 12.0–15.0)
Immature Granulocytes: 0 %
Lymphocytes Relative: 12 %
Lymphs Abs: 1.2 10*3/uL (ref 0.7–4.0)
MCH: 28.2 pg (ref 26.0–34.0)
MCHC: 31.7 g/dL (ref 30.0–36.0)
MCV: 89.1 fL (ref 80.0–100.0)
Monocytes Absolute: 0.7 10*3/uL (ref 0.1–1.0)
Monocytes Relative: 7 %
Neutro Abs: 7.7 10*3/uL (ref 1.7–7.7)
Neutrophils Relative %: 78 %
Platelets: 213 10*3/uL (ref 150–400)
RBC: 4.04 MIL/uL (ref 3.87–5.11)
RDW: 17 % — ABNORMAL HIGH (ref 11.5–15.5)
WBC: 9.8 10*3/uL (ref 4.0–10.5)
nRBC: 0 % (ref 0.0–0.2)

## 2020-11-15 LAB — BASIC METABOLIC PANEL
Anion gap: 13 (ref 5–15)
BUN: 12 mg/dL (ref 8–23)
CO2: 27 mmol/L (ref 22–32)
Calcium: 8.4 mg/dL — ABNORMAL LOW (ref 8.9–10.3)
Chloride: 94 mmol/L — ABNORMAL LOW (ref 98–111)
Creatinine, Ser: 3.56 mg/dL — ABNORMAL HIGH (ref 0.44–1.00)
GFR, Estimated: 13 mL/min — ABNORMAL LOW (ref >60)
Glucose, Bld: 114 mg/dL — ABNORMAL HIGH (ref 70–99)
Potassium: 4.6 mmol/L (ref 3.5–5.1)
Sodium: 134 mmol/L — ABNORMAL LOW (ref 135–145)

## 2020-11-15 LAB — HEPATITIS B SURFACE ANTIGEN: Hepatitis B Surface Ag: NONREACTIVE

## 2020-11-15 LAB — HEPATITIS B CORE ANTIBODY, TOTAL: Hep B Core Total Ab: REACTIVE — AB

## 2020-11-15 LAB — BRAIN NATRIURETIC PEPTIDE: B Natriuretic Peptide: 1038.7 pg/mL — ABNORMAL HIGH (ref 0.0–100.0)

## 2020-11-15 LAB — HEPATITIS B SURFACE ANTIBODY,QUALITATIVE: Hep B S Ab: REACTIVE — AB

## 2020-11-15 MED ORDER — LIDOCAINE HCL (PF) 1 % IJ SOLN: 5.0000 mL | INTRAMUSCULAR | Status: DC | PRN

## 2020-11-15 MED ORDER — ALBUTEROL SULFATE HFA 108 (90 BASE) MCG/ACT IN AERS: 2.0000 | INHALATION_SPRAY | RESPIRATORY_TRACT | Status: DC | PRN

## 2020-11-15 MED ORDER — HEPARIN SODIUM (PORCINE) 1000 UNIT/ML DIALYSIS: 1000.0000 [IU] | INTRAMUSCULAR | Status: DC | PRN

## 2020-11-15 MED ORDER — SODIUM CHLORIDE 0.9 % IV SOLN: 100.0000 mL | INTRAVENOUS | Status: DC | PRN

## 2020-11-15 MED ORDER — PENTAFLUOROPROP-TETRAFLUOROETH EX AERO: 1.0000 "application " | INHALATION_SPRAY | CUTANEOUS | Status: DC | PRN

## 2020-11-15 MED ORDER — LIDOCAINE-PRILOCAINE 2.5-2.5 % EX CREA: 1.0000 "application " | TOPICAL_CREAM | CUTANEOUS | Status: DC | PRN

## 2020-11-15 MED ORDER — SODIUM ZIRCONIUM CYCLOSILICATE 10 G PO PACK
10.0000 g | PACK | Freq: Once | ORAL | Status: AC
  Administered 2020-11-15: 10 g via ORAL
  Filled 2020-11-15: qty 1

## 2020-11-15 MED ORDER — CHLORHEXIDINE GLUCONATE CLOTH 2 % EX PADS: 6.0000 | MEDICATED_PAD | Freq: Every day | CUTANEOUS | Status: DC

## 2020-11-15 MED ORDER — SODIUM BICARBONATE 8.4 % IV SOLN
50.0000 meq | Freq: Once | INTRAVENOUS | Status: AC
  Administered 2020-11-15: 50 meq via INTRAVENOUS
  Filled 2020-11-15: qty 50

## 2020-11-15 NOTE — ED Provider Notes (Signed)
[facility] Provider Note   CSN: [id number] Arrival date & time: 11/15/20  0010     History Chief Complaint  Patient presents with   Shortness of Breath    Alexa Dougherty is a 75 y.o. female.  Patient is a 75 year old female with past medical history of end-stage renal disease on hemodialysis, hypertension, hyperlipidemia, GERD.  Patient presenting today for evaluation of shortness of breath.  This began at approximately 6:00 this evening.  She describes difficulty breathing that is worse when she ambulates and lays flat.  She denies any fevers or chills.  She denies any productive cough.  She does report perhaps eating more watermelon and cucumber over the weekend and believes that this may have caused fluid retention.  The history is provided by the patient.  Shortness of Breath Severity:  Moderate Onset quality:  Sudden Duration:  5 hours Timing:  Constant Progression:  Worsening Chronicity:  Recurrent Context: activity   Relieved by:  Oxygen Worsened by:  Activity     Past Medical History:  Diagnosis Date   Anemia    Arthritis    Right shoulder   Blood transfusion without reported diagnosis    Yrs ago when she had anemia   Cataract    bilateral repair   Colon polyps    Elevated cholesterol    ESRD on hemodialysis ([place])    Gastritis and gastroduodenitis 12/29/2014   GERD (gastroesophageal reflux disease)    Headache(784.0)    Hx of adenomatous colonic polyps 01/04/2015   Hyperlipemia    Hypertension     Patient Active Problem List   Diagnosis Date Noted   Diverticular hemorrhage 09/07/2020   GI bleed 09/07/2020   Lower GI bleed 09/06/2020   Hypertensive emergency 09/09/2018   Acute respiratory failure with hypoxia (HCC)    General weakness 09/01/2016   Symptomatic anemia 09/01/2016   HTN (hypertension) 09/01/2016   HLD (hyperlipidemia) 09/01/2016   Hx of adenomatous colonic polyps 01/04/2015   History of  Gastritis and gastroduodenitis 12/29/2014   CKD (chronic kidney disease) stage V requiring chronic dialysis ([place]) 01/24/2012    Past Surgical History:  Procedure Laterality Date   AV FISTULA PLACEMENT  02/02/2012   Procedure: ARTERIOVENOUS (AV) FISTULA CREATION;  Surgeon: Angelia Mould, MD;  Location: [facility];  Service: Vascular;  Laterality: Left;   AV FISTULA REPAIR     Angioplasty of fistula multiple times Left   COLONOSCOPY  2016   COLONOSCOPY WITH PROPOFOL N/A 12/29/2014   Procedure: COLONOSCOPY WITH PROPOFOL;  Surgeon: Gatha Mayer, MD;  Location: [facility];  Service: Endoscopy;  Laterality: N/A;   ESOPHAGOGASTRODUODENOSCOPY (EGD) WITH PROPOFOL N/A 12/29/2014   Procedure: ESOPHAGOGASTRODUODENOSCOPY (EGD) WITH PROPOFOL;  Surgeon: Gatha Mayer, MD;  Location: [facility];  Service: Endoscopy;  Laterality: N/A;   FISTULOGRAM N/A 04/01/2012   Procedure: FISTULOGRAM;  Surgeon: Angelia Mould, MD;  Location: [facility] CATH LAB;  Service: Cardiovascular;  Laterality: N/A;   INSERTION OF DIALYSIS CATHETER  02/02/2012   Procedure: INSERTION OF DIALYSIS CATHETER;  Surgeon: Angelia Mould, MD;  Location: [facility];  Service: Vascular;  Laterality: Right;  Insertion of 23cm dialysis catheter in Right Internal Jugular    IR ANGIOGRAM SELECTIVE EACH ADDITIONAL VESSEL  09/06/2020   IR ANGIOGRAM SELECTIVE EACH ADDITIONAL VESSEL  09/06/2020   IR ANGIOGRAM SELECTIVE EACH ADDITIONAL VESSEL  09/06/2020   IR ANGIOGRAM VISCERAL SELECTIVE  09/06/2020   IR EMBO ART  VEN HEMORR LYMPH EXTRAV  INC GUIDE ROADMAPPING  09/06/2020   IR US GUIDE VASC ACCESS RIGHT  09/06/2020   TUBAL LIGATION  1980     OB History   No obstetric history on file.     Family History  Problem Relation Age of Onset   Hypertension Mother    Cancer Sister        type unknown   Cancer Brother        type unknown   Hypertension Daughter    Colon cancer Neg Hx    Colon polyps Neg Hx    Esophageal cancer Neg Hx     Stomach cancer Neg Hx    Rectal cancer Neg Hx     Social History   Tobacco Use   Smoking status: Never   Smokeless tobacco: Never  Vaping Use   Vaping Use: Never used  Substance Use Topics   Alcohol use: No   Drug use: No    Home Medications Prior to Admission medications   Medication Sig Start Date End Date Taking? Authorizing Provider  acetaminophen (TYLENOL) 500 MG tablet Take 500 mg by mouth every 6 (six) hours as needed for moderate pain or headache.    [provider]  amLODipine (NORVASC) 10 MG tablet Take 1 tablet by mouth every evening. 10/14/14   [provider]  atorvastatin (LIPITOR) 10 MG tablet Take 10 mg by mouth every evening.     [provider]  cinacalcet (SENSIPAR) 60 MG tablet Take 60 mg by mouth at bedtime.    [provider]  dexlansoprazole (DEXILANT) 60 MG capsule Take 60 mg by mouth daily.     [provider]  hydrALAZINE (APRESOLINE) 25 MG tablet Take 25 mg by mouth in the morning and at bedtime.    [provider]  lidocaine-prilocaine (EMLA) cream Apply 1 application topically See admin instructions. Apply a small amount to access site (AVF) 1 to 2 hours before dialysis. Cover with occlusive dressing (saran warp). 08/27/18   [provider]  metoprolol tartrate (LOPRESSOR) 50 MG tablet Take 1 tablet (50 mg total) by mouth 2 (two) times daily. 09/13/18   Meccariello, Bernita Raisin, DO  multivitamin (RENA-VIT) TABS tablet Take 1 tablet by mouth daily.    [provider]  sevelamer carbonate (RENVELA) 800 MG tablet Take 3 tablets (2,400 mg total) by mouth 3 (three) times daily with meals. 09/13/18   Meccariello, Bernita Raisin, DO    Allergies    Patient has no known allergies.  Review of Systems   Review of Systems  Respiratory:  Positive for shortness of breath.   All other systems reviewed and are negative.  Physical Exam Updated Vital Signs BP (!) 190/75 (BP Location: Right Arm)   Pulse 67    Temp 98.1 F (36.7 C) (Oral)   Resp (!) 21   SpO2 97%   Physical Exam Vitals and nursing note reviewed.  Constitutional:      General: She is not in acute distress.    Appearance: She is well-developed. She is not diaphoretic.  HENT:     Head: Normocephalic and atraumatic.  Cardiovascular:     Rate and Rhythm: Normal rate and regular rhythm.     Heart sounds: No murmur heard.   No friction rub. No gallop.  Pulmonary:     Effort: Pulmonary effort is normal. No respiratory distress.     Breath sounds: Examination of the right-lower field reveals rales. Examination of the left-lower field reveals rales. Rales present. No  wheezing.     Comments: There are slight rales in the bases bilaterally. Abdominal:     General: Bowel sounds are normal. There is no distension.     Palpations: Abdomen is soft.     Tenderness: There is no abdominal tenderness.  Musculoskeletal:        General: Normal range of motion.     Cervical back: Normal range of motion and neck supple.     Right lower leg: No tenderness. No edema.     Left lower leg: No tenderness. No edema.  Skin:    General: Skin is warm and dry.  Neurological:     General: No focal deficit present.     Mental Status: She is alert and oriented to person, place, and time.    ED Results / Procedures / Treatments   Labs (all labs ordered are listed, but only abnormal results are displayed) Labs Reviewed  COMPREHENSIVE METABOLIC PANEL  CBC WITH DIFFERENTIAL/PLATELET  BRAIN NATRIURETIC PEPTIDE    EKG EKG Interpretation  Date/Time:  Monday November 15 2020 00:23:11 EDT Ventricular Rate:  66 PR Interval:  202 QRS Duration: 106 QT Interval:  417 QTC Calculation: 437 R Axis:   70 Text Interpretation: Sinus rhythm Nonspecific T wave abnormality No significant change since 09/07/2020 Confirmed by Veryl Speak [phone]) on 11/15/2020 12:25:31 AM  Radiology No results found.  Procedures Procedures   Medications Ordered in  ED Medications  albuterol (VENTOLIN HFA) 108 (90 Base) MCG/ACT inhaler 2 puff (has no administration in time range)    ED Course  I have reviewed the triage vital signs and the nursing notes.  Pertinent labs & imaging results that were available during my care of the patient were reviewed by me and considered in my medical decision making (see chart for details).    MDM Rules/Calculators/A&P  Patient presenting here by EMS with complaints of shortness of breath.  She has history of end-stage renal disease and is on hemodialysis.  Her chest x-ray and exam is consistent with volume overload and potassium has also returned at 6.9.  Care discussed with Dr. Royce Macadamia from nephrology.  She is recommending bicarb and Lokelma.  Arrangements will be made for her to go to dialysis emergently.  CRITICAL CARE Performed by: Veryl Speak Total critical care time: 45 minutes Critical care time was exclusive of separately billable procedures and treating other patients. Critical care was necessary to treat or prevent imminent or life-threatening deterioration. Critical care was time spent personally by me on the following activities: development of treatment plan with patient and/or surrogate as well as nursing, discussions with consultants, evaluation of patient's response to treatment, examination of patient, obtaining history from patient or surrogate, ordering and performing treatments and interventions, ordering and review of laboratory studies, ordering and review of radiographic studies, pulse oximetry and re-evaluation of patient's condition.   Final Clinical Impression(s) / ED Diagnoses Final diagnoses:  None    Rx / DC Orders ED Discharge Orders     None        Veryl Speak, MD 11/15/20 [phone]

## 2020-11-15 NOTE — Progress Notes (Signed)
Dialysis Nurse Note  [phone]- called lab for stat K+ result, they did not receive dt tube system being down. This RN drew a second stat K+ and a nurse brought it down; told them it was stat.   [phone]- called lab- had not finished resulting the 2nd tube; would be another 15-77min.   0735, contacted lab- reported a K+ less than 2.0 (most likely erroneous).  Contacted Dr. Royce Macadamia re stat K+ result. Explained that 1st stat tube sent to lab, but they had not received it by 0645; stated that the tube system was down. The second tube that was sent just resulted as K+ less than 2.0. Dr. Royce Macadamia asked if pt has been on 2K which this RN verified. Orders received to continue on 2K bath. For ER to draw a K+ 2 hrs after HD.

## 2020-11-15 NOTE — Consult Note (Signed)
ER Consult   Alexa Dougherty EVO:[id number] DOB: May 04, 1946 DOA: 11/15/2020  PCP: Alexa Bill, MD Consultants:  Alexa Dougherty - GI; Early - vascular; nephrology Patient coming from:  Home; NOK: Daughter, Alexa Dougherty, [phone]   Chief Complaint: SOB  HPI: Alexa Dougherty is a 75 y.o. female with medical history significant of HLD; ESRD on HD; and HTN who presented overnight with SOB.  Patient attended HD Friday without issue.  Last evening, she became acutely SOB.  Family reported that patient ate a lot of cucumbers and watermelon over the weekend.  She is currently s/p HD and reports that her SOB has improved.  Denies CP - now or overnight.    ED Course: She presented overnight with SOB.  CXR c/w pulmonary edema.  She was given bicarb and Lokelma (K+ 6.9) and taken for emergent HD.  Review of Systems: As per HPI; otherwise review of systems reviewed and negative. Limited by limited English (did not request a translator when offered).  Ambulatory Status:  Ambulates without assistance    Past Medical History:  Diagnosis Date   Anemia    Arthritis    Right shoulder   Blood transfusion without reported diagnosis    Yrs ago when she had anemia   Cataract    bilateral repair   Colon polyps    ESRD on hemodialysis ([place])    Gastritis and gastroduodenitis 12/29/2014   GERD (gastroesophageal reflux disease)    Headache(784.0)    Hx of adenomatous colonic polyps 01/04/2015   Hyperlipemia    Hypertension     Past Surgical History:  Procedure Laterality Date   AV FISTULA PLACEMENT  02/02/2012   Procedure: ARTERIOVENOUS (AV) FISTULA CREATION;  Surgeon: Alexa Mould, MD;  Location: [place];  Service: Vascular;  Laterality: Left;   AV FISTULA REPAIR     Angioplasty of fistula multiple times Left   COLONOSCOPY  2016   COLONOSCOPY WITH PROPOFOL N/A 12/29/2014   Procedure: COLONOSCOPY WITH PROPOFOL;  Surgeon: Alexa Mayer, MD;  [facility];  Service:  Endoscopy;  Laterality: N/A;   ESOPHAGOGASTRODUODENOSCOPY (EGD) WITH PROPOFOL N/A 12/29/2014   Procedure: ESOPHAGOGASTRODUODENOSCOPY (EGD) WITH PROPOFOL;  Surgeon: Alexa Mayer, MD;  Location[facility];  Service: Endoscopy;  Laterality: N/A;   FISTULOGRAM N/A 04/01/2012   Procedure: FISTULOGRAM;  Surgeon: Alexa Mould, MD;  Location[facility] CATH LAB;  Service: Cardiovascular;  Laterality: N/A;   INSERTION OF DIALYSIS CATHETER  02/02/2012   Procedure: INSERTION OF DIALYSIS CATHETER;  Surgeon: Alexa Mould, MD;  Location: [place];  Service: Vascular;  Laterality: Right;  Insertion of 23cm dialysis catheter in Right Internal Jugular    IR ANGIOGRAM SELECTIVE EACH ADDITIONAL VESSEL  09/06/2020   IR ANGIOGRAM SELECTIVE EACH ADDITIONAL VESSEL  09/06/2020   IR ANGIOGRAM SELECTIVE EACH ADDITIONAL VESSEL  09/06/2020   IR ANGIOGRAM VISCERAL SELECTIVE  09/06/2020   IR EMBO ART  VEN HEMORR LYMPH EXTRAV  INC GUIDE ROADMAPPING  09/06/2020   IR US GUIDE VASC ACCESS RIGHT  09/06/2020   TUBAL LIGATION  1980    Social History   Socioeconomic History   Marital status: Widowed    Spouse name: Not on file   Number of children: 7   Years of education: Not on file   Highest education level: Not on file  Occupational History   Occupation: retired  Tobacco Use   Smoking status: Never   Smokeless tobacco: Never  Vaping Use   Vaping Use: Never used  Substance  and Sexual Activity   Alcohol use: No   Drug use: No   Sexual activity: Not on file  Other Topics Concern   Not on file  Social History Narrative   Not on file   Social Determinants of Health   Financial Resource Strain: Not on file  Food Insecurity: Not on file  Transportation Needs: Not on file  Physical Activity: Not on file  Stress: Not on file  Social Connections: Not on file  Intimate Partner Violence: Not on file    No Known Allergies  Family History  Problem Relation Age of Onset   Hypertension Mother    Cancer  Sister        type unknown   Cancer Brother        type unknown   Hypertension Daughter    Colon cancer Neg Hx    Colon polyps Neg Hx    Esophageal cancer Neg Hx    Stomach cancer Neg Hx    Rectal cancer Neg Hx     Prior to Admission medications   Medication Sig Start Date End Date Taking? Authorizing Provider  acetaminophen (TYLENOL) 500 MG tablet Take 500 mg by mouth every 6 (six) hours as needed for moderate pain or headache.    [provider]  amLODipine (NORVASC) 10 MG tablet Take 1 tablet by mouth every evening. 10/14/14   [provider]  atorvastatin (LIPITOR) 10 MG tablet Take 10 mg by mouth every evening.     [provider]  cinacalcet (SENSIPAR) 60 MG tablet Take 60 mg by mouth at bedtime.    [provider]  dexlansoprazole (DEXILANT) 60 MG capsule Take 60 mg by mouth daily.     [provider]  hydrALAZINE (APRESOLINE) 25 MG tablet Take 25 mg by mouth in the morning and at bedtime.    [provider]  lidocaine-prilocaine (EMLA) cream Apply 1 application topically See admin instructions. Apply a small amount to access site (AVF) 1 to 2 hours before dialysis. Cover with occlusive dressing (saran warp). 08/27/18   [provider]  metoprolol tartrate (LOPRESSOR) 50 MG tablet Take 1 tablet (50 mg total) by mouth 2 (two) times daily. 09/13/18   Alexa Dougherty, Alexa Raisin, DO  multivitamin (RENA-VIT) TABS tablet Take 1 tablet by mouth daily.    [provider]  sevelamer carbonate (RENVELA) 800 MG tablet Take 3 tablets (2,400 mg total) by mouth 3 (three) times daily with meals. 09/13/18   Alexa Dougherty, Alexa Raisin, DO    Physical Exam: Vitals:   11/15/20 0800 11/15/20 0815 11/15/20 0830 11/15/20 0852  BP: (!) 153/73 134/61 140/68 (!) 141/64  Pulse: 66 67 70 70  Resp: 20 16 20 15   Temp:    (!) 95.2 F (35.1 C)  TempSrc:    Axillary  SpO2:  99% 100% 100%     General:  Appears calm and comfortable and is in  NAD Eyes:  EOMI, normal lids, iris ENT:  grossly normal hearing, lips & tongue, mmm Neck:  no LAD, masses or thyromegaly Cardiovascular:  RRR, no m/r/g. No LE edema.  Respiratory:   CTA bilaterally with no wheezes/rales/rhonchi.  Normal respiratory effort. Abdomen:  soft, NT, ND Skin:  no rash or induration seen on limited exam Musculoskeletal:  grossly normal tone BUE/BLE, good ROM, no bony abnormality Psychiatric:  grossly normal mood and affect, speech fluent and appropriate, limited by language barrier (declined translator) Neurologic:  CN 2-12 grossly intact, moves all extremities in coordinated fashion  Radiological Exams on Admission: Independently reviewed - see discussion in A/P where applicable  DG Chest 2 View  Result Date: 11/15/2020 CLINICAL DATA:  Dyspnea EXAM: CHEST - 2 VIEW COMPARISON:  06/14/2020 FINDINGS: Pulmonary insufflation is stable. Mild eventration of the right hemidiaphragm. Diffuse interstitial pulmonary infiltrate is present in keeping with mild interstitial pulmonary edema, likely cardiogenic in nature. No pneumothorax. No pleural effusion. Cardiac size is mildly enlarged. No acute bone abnormality. IMPRESSION: Mild cardiogenic failure.  Stable cardiomegaly. Electronically Signed   By: Fidela Salisbury MD   On: 11/15/2020 00:56    EKG: Independently reviewed.  NSR with rate 66; nonspecific ST changes with no evidence of acute ischemia   Labs on Admission: I have personally reviewed the available labs and imaging studies at the time of the admission.  Pertinent labs:   K+ 6.9 BUN 60/Creatinine 9.45/GFR 4 BNP 1038.7 Hgb 11.4   Assessment/Plan Principal Problem:   Volume overload Active Problems:   CKD (chronic kidney disease) stage V requiring chronic dialysis (HCC)   Hyperkalemia, diminished renal excretion   Volume overload in an ESRD on HD patient -Suspect this is less related to heart failure and more related to dietary indiscretion leading to  volume overload -Since she is dialysis-dependent, she was unable to clear the excess fluid -She has completed HD and is feeling better -Certainly, she needs fluid restriction and very low salt intake on an ongoing basis  -Nephrology does not think patient needs admission at this time -Patient was on O2; if able to wean off O2 and BMP is corrected (had hyperkalemia at the time of presentation), she should be appropriate for d/c to home this AM -No further TRH management appears to be needed at this time -EDP, nephrology, and nursing are in agreement with this plan.      Karmen Bongo MD [facility]   How to contact the [facility] Attending or Consulting provider [place] or covering provider during after hours [place], for this patient?  Check the care team in [facility] and look for a) attending/consulting TRH provider listed and b) the [facility] team listed Log into [email or web address] and use [place]'s universal password to access. If you do not have the password, please contact the hospital operator. Locate the [facility] provider you are looking for under [facility] and page to a number that you can be directly reached. If you still have difficulty reaching the provider, please page the [facility] (Director on Call) for the Hospitalists listed on amion for assistance.   11/15/2020, 10:22 AM

## 2020-11-15 NOTE — ED Notes (Signed)
[name] (daughter): [phone]

## 2020-11-15 NOTE — Progress Notes (Signed)
I ordered a new BMET order for this lady, the earlier drawn specimen was done too early after HD.    Kelly Splinter, MD 11/15/2020, 10:33 AM

## 2020-11-15 NOTE — ED Notes (Signed)
Patient Alert and oriented to baseline. Stable and ambulatory to baseline. Patient verbalized understanding of the discharge instructions.  Patient belongings were taken by the patient.   

## 2020-11-15 NOTE — ED Provider Notes (Signed)
Brief update note  75 year old lady seen last night due to concern for shortness of breath, missed dialysis.  Hyperkalemic.  Nephrology arranged for dialysis this morning.  Patient has completed dialysis.  Hospitalist had been consulted last night due to the hyperkalemia.  Dr. Lorin Mercy evaluated patient this morning, given patient has no ongoing symptoms after dialysis and repeat BMP within normal limits, does not feel patient need to be admitted.  Nephrology is in agreement. Patient is currently resting comfortable in hallway.  Vital signs are stable. Will dc home.    Lucrezia Starch, MD 11/15/20 1141

## 2020-11-15 NOTE — ED Triage Notes (Signed)
Here for [facility] x5hrs. Dialysis MWF, compliant, F session completed w no issue. RA is 87%, 3L [place] 97%- not on home O2 Family states pt has "eaten a lot of cucumbers/watermelon this weekend, too much water content in food." Concerned for fluid overload Hx HTN, ESRD 186/94

## 2020-11-15 NOTE — H&P (Deleted)
History and Physical    Alexa Dougherty UXN:[id number] DOB: [birth year] DOA: 11/15/2020  PCP: Bartholome Bill, MD Consultants:  Carlean Purl - GI; Early - vascular; nephrology Patient coming from:  Home; NOK: Daughter, Alexa Dougherty, [phone]   Chief Complaint: SOB  HPI: Alexa Dougherty is a 75 y.o. female with medical history significant of HLD; ESRD on HD; and HTN who presented overnight with SOB.  Patient attended HD Friday without issue.  Last evening, she became acutely SOB.  Family reported that patient ate a lot of cucumbers and watermelon over the weekend.  She is currently s/p HD and reports that her SOB has improved.  Denies CP - now or overnight.    ED Course: She presented overnight with SOB.  CXR c/w pulmonary edema.  She was given bicarb and Lokelma (K+ 6.9) and taken for emergent HD.  Review of Systems: As per HPI; otherwise review of systems reviewed and negative. Limited by limited English (did not request a translator when offered).  Ambulatory Status:  Ambulates without assistance    Past Medical History:  Diagnosis Date   Anemia    Arthritis    Right shoulder   Blood transfusion without reported diagnosis    Yrs ago when she had anemia   Cataract    bilateral repair   Colon polyps    ESRD on hemodialysis ([place])    Gastritis and gastroduodenitis 12/29/2014   GERD (gastroesophageal reflux disease)    Headache(784.0)    Hx of adenomatous colonic polyps 01/04/2015   Hyperlipemia    Hypertension     Past Surgical History:  Procedure Laterality Date   AV FISTULA PLACEMENT  02/02/2012   Procedure: ARTERIOVENOUS (AV) FISTULA CREATION;  Surgeon: Angelia Mould, MD;  Location: [place];  Service: Vascular;  Laterality: Left;   AV FISTULA REPAIR     Angioplasty of fistula multiple times Left   COLONOSCOPY  2016   COLONOSCOPY WITH PROPOFOL N/A 12/29/2014   Procedure: COLONOSCOPY WITH PROPOFOL;  Surgeon: Gatha Mayer, MD;  [facility];   Service: Endoscopy;  Laterality: N/A;   ESOPHAGOGASTRODUODENOSCOPY (EGD) WITH PROPOFOL N/A 12/29/2014   Procedure: ESOPHAGOGASTRODUODENOSCOPY (EGD) WITH PROPOFOL;  Surgeon: Gatha Mayer, MD;  [facility];  Service: Endoscopy;  Laterality: N/A;   FISTULOGRAM N/A 04/01/2012   Procedure: FISTULOGRAM;  Surgeon: Angelia Mould, MD;  Location: [facility] CATH LAB;  Service: Cardiovascular;  Laterality: N/A;   INSERTION OF DIALYSIS CATHETER  02/02/2012   Procedure: INSERTION OF DIALYSIS CATHETER;  Surgeon: Angelia Mould, MD;  Location: [place];  Service: Vascular;  Laterality: Right;  Insertion of 23cm dialysis catheter in Right Internal Jugular    IR ANGIOGRAM SELECTIVE EACH ADDITIONAL VESSEL  09/06/2020   IR ANGIOGRAM SELECTIVE EACH ADDITIONAL VESSEL  09/06/2020   IR ANGIOGRAM SELECTIVE EACH ADDITIONAL VESSEL  09/06/2020   IR ANGIOGRAM VISCERAL SELECTIVE  09/06/2020   IR EMBO ART  VEN HEMORR LYMPH EXTRAV  INC GUIDE ROADMAPPING  09/06/2020   IR US GUIDE VASC ACCESS RIGHT  09/06/2020   TUBAL LIGATION  1980    Social History   Socioeconomic History   Marital status: Widowed    Spouse name: Not on file   Number of children: 7   Years of education: Not on file   Highest education level: Not on file  Occupational History   Occupation: retired  Tobacco Use   Smoking status: Never   Smokeless tobacco: Never  Vaping Use   Vaping Use: Never used  Substance and Sexual Activity   Alcohol use: No   Drug use: No   Sexual activity: Not on file  Other Topics Concern   Not on file  Social History Narrative   Not on file   Social Determinants of Health   Financial Resource Strain: Not on file  Food Insecurity: Not on file  Transportation Needs: Not on file  Physical Activity: Not on file  Stress: Not on file  Social Connections: Not on file  Intimate Partner Violence: Not on file    No Known Allergies  Family History  Problem Relation Age of Onset   Hypertension Mother     Cancer Sister        type unknown   Cancer Brother        type unknown   Hypertension Daughter    Colon cancer Neg Hx    Colon polyps Neg Hx    Esophageal cancer Neg Hx    Stomach cancer Neg Hx    Rectal cancer Neg Hx     Prior to Admission medications   Medication Sig Start Date End Date Taking? Authorizing Provider  acetaminophen (TYLENOL) 500 MG tablet Take 500 mg by mouth every 6 (six) hours as needed for moderate pain or headache.    [provider]  amLODipine (NORVASC) 10 MG tablet Take 1 tablet by mouth every evening. 10/14/14   [provider]  atorvastatin (LIPITOR) 10 MG tablet Take 10 mg by mouth every evening.     [provider]  cinacalcet (SENSIPAR) 60 MG tablet Take 60 mg by mouth at bedtime.    [provider]  dexlansoprazole (DEXILANT) 60 MG capsule Take 60 mg by mouth daily.     [provider]  hydrALAZINE (APRESOLINE) 25 MG tablet Take 25 mg by mouth in the morning and at bedtime.    [provider]  lidocaine-prilocaine (EMLA) cream Apply 1 application topically See admin instructions. Apply a small amount to access site (AVF) 1 to 2 hours before dialysis. Cover with occlusive dressing (saran warp). 08/27/18   [provider]  metoprolol tartrate (LOPRESSOR) 50 MG tablet Take 1 tablet (50 mg total) by mouth 2 (two) times daily. 09/13/18   Meccariello, Bernita Raisin, DO  multivitamin (RENA-VIT) TABS tablet Take 1 tablet by mouth daily.    [provider]  sevelamer carbonate (RENVELA) 800 MG tablet Take 3 tablets (2,400 mg total) by mouth 3 (three) times daily with meals. 09/13/18   Meccariello, Bernita Raisin, DO    Physical Exam: Vitals:   11/15/20 0800 11/15/20 0815 11/15/20 0830 11/15/20 0852  BP: (!) 153/73 134/61 140/68 (!) 141/64  Pulse: 66 67 70 70  Resp: 20 16 20 15   Temp:    (!) 95.2 F (35.1 C)  TempSrc:    Axillary  SpO2:  99% 100% 100%     General:  Appears calm and comfortable and is in  NAD Eyes:  EOMI, normal lids, iris ENT:  grossly normal hearing, lips & tongue, mmm Neck:  no LAD, masses or thyromegaly Cardiovascular:  RRR, no m/r/g. No LE edema.  Respiratory:   CTA bilaterally with no wheezes/rales/rhonchi.  Normal respiratory effort. Abdomen:  soft, NT, ND Skin:  no rash or induration seen on limited exam Musculoskeletal:  grossly normal tone BUE/BLE, good ROM, no bony abnormality Psychiatric:  grossly normal mood and affect, speech fluent and appropriate, limited by language barrier (declined translator) Neurologic:  CN 2-12 grossly intact, moves all extremities in coordinated  fashion    Radiological Exams on Admission: Independently reviewed - see discussion in A/P where applicable  DG Chest 2 View  Result Date: 11/15/2020 CLINICAL DATA:  Dyspnea EXAM: CHEST - 2 VIEW COMPARISON:  06/14/2020 FINDINGS: Pulmonary insufflation is stable. Mild eventration of the right hemidiaphragm. Diffuse interstitial pulmonary infiltrate is present in keeping with mild interstitial pulmonary edema, likely cardiogenic in nature. No pneumothorax. No pleural effusion. Cardiac size is mildly enlarged. No acute bone abnormality. IMPRESSION: Mild cardiogenic failure.  Stable cardiomegaly. Electronically Signed   By: Fidela Salisbury MD   On: 11/15/2020 00:56    EKG: Independently reviewed.  NSR with rate 66; nonspecific ST changes with no evidence of acute ischemia   Labs on Admission: I have personally reviewed the available labs and imaging studies at the time of the admission.  Pertinent labs:   K+ 6.9 BUN 60/Creatinine 9.45/GFR 4 BNP 1038.7 Hgb 11.4   Assessment/Plan Principal Problem:   Volume overload Active Problems:   CKD (chronic kidney disease) stage V requiring chronic dialysis (HCC)   Hyperkalemia, diminished renal excretion   Volume overload in an ESRD on HD patient -Suspect this is less related to heart failure and more related to dietary indiscretion leading to  volume overload -Since she is dialysis-dependent, she was unable to clear the excess fluid -She has completed HD and is feeling better -Certainly, she needs fluid restriction and very low salt intake on an ongoing basis  -Nephrology does not think patient needs admission at this time -Patient was on O2; if able to wean off O2 and BMP is corrected (had hyperkalemia at the time of presentation), she should be appropriate for d/c to home this AM -No further TRH management appears to be needed at this time -EDP, nephrology, and nursing are in agreement with this plan.      Karmen Bongo MD [facility]   How to contact the [facility] Attending or Consulting provider [place] or covering provider during after hours [place], for this patient?  Check the care team in [facility] and look for a) attending/consulting TRH provider listed and b) the [facility] team listed Log into [email or web address] and use [place]'s universal password to access. If you do not have the password, please contact the hospital operator. Locate the [facility] provider you are looking for under [facility] and page to a number that you can be directly reached. If you still have difficulty reaching the provider, please page the [facility] (Director on Call) for the Hospitalists listed on amion for assistance.   11/15/2020, 10:22 AM

## 2020-11-15 NOTE — Discharge Instructions (Addendum)
Please follow-up with your nephrologist and your primary doctor.  Continue your regular dialysis schedule.  You should never miss dialysis.  Come back to ER if you develop chest pain, difficulty breathing or other new concerning symptom.  Please follow the fluid restrictions and salt restrictions as outlined by your nephrologist.

## 2021-08-14 ENCOUNTER — Emergency Department (HOSPITAL_COMMUNITY): Payer: Medicare Other

## 2021-08-14 ENCOUNTER — Inpatient Hospital Stay (HOSPITAL_COMMUNITY)
Admission: EM | Admit: 2021-08-14 | Discharge: 2021-08-16 | DRG: 640 | Disposition: A | Discharge status: Home / Self Care | Payer: Medicare Other | Attending: Internal Medicine | Admitting: Internal Medicine

## 2021-08-14 ENCOUNTER — Other Ambulatory Visit: Payer: Self-pay

## 2021-08-14 ENCOUNTER — Encounter (HOSPITAL_COMMUNITY): Payer: Self-pay

## 2021-08-14 DIAGNOSIS — E8770 Fluid overload, unspecified: Secondary | ICD-10-CM | POA: Diagnosis not present

## 2021-08-14 DIAGNOSIS — I700 Atherosclerosis of aorta: Secondary | ICD-10-CM | POA: Diagnosis present

## 2021-08-14 DIAGNOSIS — E785 Hyperlipidemia, unspecified: Secondary | ICD-10-CM | POA: Diagnosis present

## 2021-08-14 DIAGNOSIS — Z992 Dependence on renal dialysis: Secondary | ICD-10-CM

## 2021-08-14 DIAGNOSIS — N289 Disorder of kidney and ureter, unspecified: Secondary | ICD-10-CM

## 2021-08-14 DIAGNOSIS — Z809 Family history of malignant neoplasm, unspecified: Secondary | ICD-10-CM

## 2021-08-14 DIAGNOSIS — E875 Hyperkalemia: Secondary | ICD-10-CM | POA: Diagnosis present

## 2021-08-14 DIAGNOSIS — J9601 Acute respiratory failure with hypoxia: Secondary | ICD-10-CM | POA: Diagnosis present

## 2021-08-14 DIAGNOSIS — Z20822 Contact with and (suspected) exposure to covid-19: Secondary | ICD-10-CM | POA: Diagnosis present

## 2021-08-14 DIAGNOSIS — Z79899 Other long term (current) drug therapy: Secondary | ICD-10-CM

## 2021-08-14 DIAGNOSIS — Z8249 Family history of ischemic heart disease and other diseases of the circulatory system: Secondary | ICD-10-CM

## 2021-08-14 DIAGNOSIS — K219 Gastro-esophageal reflux disease without esophagitis: Secondary | ICD-10-CM | POA: Diagnosis present

## 2021-08-14 DIAGNOSIS — D631 Anemia in chronic kidney disease: Secondary | ICD-10-CM | POA: Diagnosis present

## 2021-08-14 DIAGNOSIS — I5032 Chronic diastolic (congestive) heart failure: Secondary | ICD-10-CM | POA: Diagnosis present

## 2021-08-14 DIAGNOSIS — R34 Anuria and oliguria: Secondary | ICD-10-CM | POA: Diagnosis present

## 2021-08-14 DIAGNOSIS — R0602 Shortness of breath: Secondary | ICD-10-CM | POA: Diagnosis not present

## 2021-08-14 DIAGNOSIS — J810 Acute pulmonary edema: Secondary | ICD-10-CM

## 2021-08-14 DIAGNOSIS — I132 Hypertensive heart and chronic kidney disease with heart failure and with stage 5 chronic kidney disease, or end stage renal disease: Secondary | ICD-10-CM | POA: Diagnosis present

## 2021-08-14 DIAGNOSIS — N186 End stage renal disease: Principal | ICD-10-CM

## 2021-08-14 DIAGNOSIS — R0902 Hypoxemia: Secondary | ICD-10-CM

## 2021-08-14 LAB — COMPREHENSIVE METABOLIC PANEL
ALT: 13 U/L (ref 0–44)
AST: 18 U/L (ref 15–41)
Albumin: 3.1 g/dL — ABNORMAL LOW (ref 3.5–5.0)
Alkaline Phosphatase: 137 U/L — ABNORMAL HIGH (ref 38–126)
Anion gap: 14 (ref 5–15)
BUN: 62 mg/dL — ABNORMAL HIGH (ref 8–23)
CO2: 28 mmol/L (ref 22–32)
Calcium: 8.7 mg/dL — ABNORMAL LOW (ref 8.9–10.3)
Chloride: 97 mmol/L — ABNORMAL LOW (ref 98–111)
Creatinine, Ser: 9.59 mg/dL — ABNORMAL HIGH (ref 0.44–1.00)
GFR, Estimated: 4 mL/min — ABNORMAL LOW (ref >60)
Glucose, Bld: 106 mg/dL — ABNORMAL HIGH (ref 70–99)
Potassium: 5.5 mmol/L — ABNORMAL HIGH (ref 3.5–5.1)
Sodium: 139 mmol/L (ref 135–145)
Total Bilirubin: 1.1 mg/dL (ref 0.3–1.2)
Total Protein: 6.8 g/dL (ref 6.5–8.1)

## 2021-08-14 LAB — I-STAT CHEM 8, ED
BUN: 57 mg/dL — ABNORMAL HIGH (ref 8–23)
Calcium, Ion: 1.02 mmol/L — ABNORMAL LOW (ref 1.15–1.40)
Chloride: 100 mmol/L (ref 98–111)
Creatinine, Ser: 10.5 mg/dL — ABNORMAL HIGH (ref 0.44–1.00)
Glucose, Bld: 103 mg/dL — ABNORMAL HIGH (ref 70–99)
HCT: 26 % — ABNORMAL LOW (ref 36.0–46.0)
Hemoglobin: 8.8 g/dL — ABNORMAL LOW (ref 12.0–15.0)
Potassium: 5.4 mmol/L — ABNORMAL HIGH (ref 3.5–5.1)
Sodium: 138 mmol/L (ref 135–145)
TCO2: 32 mmol/L (ref 22–32)

## 2021-08-14 LAB — I-STAT VENOUS BLOOD GAS, ED
Acid-Base Excess: 9 mmol/L — ABNORMAL HIGH (ref 0.0–2.0)
Bicarbonate: 33.3 mmol/L — ABNORMAL HIGH (ref 20.0–28.0)
Calcium, Ion: 1.04 mmol/L — ABNORMAL LOW (ref 1.15–1.40)
HCT: 25 % — ABNORMAL LOW (ref 36.0–46.0)
Hemoglobin: 8.5 g/dL — ABNORMAL LOW (ref 12.0–15.0)
O2 Saturation: 100 %
Potassium: 5.5 mmol/L — ABNORMAL HIGH (ref 3.5–5.1)
Sodium: 138 mmol/L (ref 135–145)
TCO2: 35 mmol/L — ABNORMAL HIGH (ref 22–32)
pCO2, Ven: 46.7 mmHg (ref 44–60)
pH, Ven: 7.461 — ABNORMAL HIGH (ref 7.25–7.43)
pO2, Ven: 163 mmHg — ABNORMAL HIGH (ref 32–45)

## 2021-08-14 LAB — CBC WITH DIFFERENTIAL/PLATELET
Abs Immature Granulocytes: 0.03 10*3/uL (ref 0.00–0.07)
Basophils Absolute: 0.1 10*3/uL (ref 0.0–0.1)
Basophils Relative: 1 %
Eosinophils Absolute: 0.1 10*3/uL (ref 0.0–0.5)
Eosinophils Relative: 1 %
HCT: 25.3 % — ABNORMAL LOW (ref 36.0–46.0)
Hemoglobin: 8.1 g/dL — ABNORMAL LOW (ref 12.0–15.0)
Immature Granulocytes: 0 %
Lymphocytes Relative: 12 %
Lymphs Abs: 1.1 10*3/uL (ref 0.7–4.0)
MCH: 26.6 pg (ref 26.0–34.0)
MCHC: 32 g/dL (ref 30.0–36.0)
MCV: 83.2 fL (ref 80.0–100.0)
Monocytes Absolute: 0.8 10*3/uL (ref 0.1–1.0)
Monocytes Relative: 8 %
Neutro Abs: 7.6 10*3/uL (ref 1.7–7.7)
Neutrophils Relative %: 78 %
Platelets: 175 10*3/uL (ref 150–400)
RBC: 3.04 MIL/uL — ABNORMAL LOW (ref 3.87–5.11)
RDW: 17.4 % — ABNORMAL HIGH (ref 11.5–15.5)
WBC: 9.6 10*3/uL (ref 4.0–10.5)
nRBC: 0 % (ref 0.0–0.2)

## 2021-08-14 LAB — RESP PANEL BY RT-PCR (FLU A&B, COVID) ARPGX2
Influenza A by PCR: NEGATIVE
Influenza B by PCR: NEGATIVE
SARS Coronavirus 2 by RT PCR: NEGATIVE

## 2021-08-14 MED ORDER — CHLORHEXIDINE GLUCONATE CLOTH 2 % EX PADS
6.0000 | MEDICATED_PAD | Freq: Every day | CUTANEOUS | Status: DC
  Administered 2021-08-16: 6 via TOPICAL

## 2021-08-14 NOTE — Progress Notes (Signed)
Asked to see patient for HD.  Pt presented w/ SOB and low SpO2. Last HD was Friday (MWF pt). Does not miss HD. States that she is not eating, has no appetite, for a while now. Only drinks coffee.  In ED CXR shows diffuse pulm edema. On exam there is mild pretib edema and mild rales at lung bases, LUA AVF+bruit.  Pt is stable, not in distress.  Recommend ED HD w/o admission tonight. Will plan HD w/ max UF 4 L as tolerated. Will return to ED post HD for reassessment prior to dc home. May need to lower her dry wt in light of her comments about not eating.  ? ? MWF HD ? 3h 13mn  400/500  2/2 bath  P2  15ga LUA AVF Hep none (from April 2022) ? ? ? ?RKelly Splinter MD ?08/14/2021, 9:28 PM ? ? ? ? ?

## 2021-08-14 NOTE — ED Triage Notes (Signed)
Pt BIB [facility]. Pt presents with ShOB and low SpO2. EMS report pt's lung sounds with rales. Pt started feeling bad on 3/24.Pt is a dialysis pt and had a complete treatment on 1/51 without complications.  ? ?EMS Vitals: ? ?198/100 ?HR 76 ?RR 28 ?CBG 115 ?

## 2021-08-14 NOTE — ED Provider Notes (Signed)
?[facility] ?Provider Note ? ? ?CSN: [id number] ?Arrival date & time: 08/14/21  0263 ? ?  ? ?History ? ?Chief Complaint  ?Patient presents with  ? Shortness of Breath  ? ? ?Quintina Hakeem is a 76 y.o. female history of ESRD on dialysis (last HD was 2 days ago), hypertension, here presenting with shortness of breath.  Patient states that she has not been eating much for the last several days.  She called EMS because she has been very short of breath.  Patient was noted to be hypoxic and was placed on a nonrebreather.  Patient is not on oxygen at baseline.  Patient denies any fever or cough.  Patient is anuric ? ?The history is provided by the patient.  ? ?  ? ?Home Medications ?Prior to Admission medications   ?Medication Sig Start Date End Date Taking? Authorizing Provider  ?acetaminophen (TYLENOL) 500 MG tablet Take 500 mg by mouth every 6 (six) hours as needed for moderate pain or headache.    [provider]  ?amLODipine (NORVASC) 10 MG tablet Take 1 tablet by mouth every evening. 10/14/14   [provider]  ?atorvastatin (LIPITOR) 10 MG tablet Take 10 mg by mouth every evening.     [provider]  ?cinacalcet (SENSIPAR) 60 MG tablet Take 60 mg by mouth at bedtime.    [provider]  ?dexlansoprazole (DEXILANT) 60 MG capsule Take 60 mg by mouth daily.     [provider]  ?hydrALAZINE (APRESOLINE) 25 MG tablet Take 25 mg by mouth in the morning and at bedtime.    [provider]  ?lidocaine-prilocaine (EMLA) cream Apply 1 application topically See admin instructions. Apply a small amount to access site (AVF) 1 to 2 hours before dialysis. Cover with occlusive dressing (saran warp). 08/27/18   [provider]  ?metoprolol tartrate (LOPRESSOR) 50 MG tablet Take 1 tablet (50 mg total) by mouth 2 (two) times daily. 09/13/18   Meccariello, Bernita Raisin, DO  ?multivitamin (RENA-VIT) TABS tablet Take 1 tablet by mouth daily.     [provider]  ?sevelamer carbonate (RENVELA) 800 MG tablet Take 3 tablets (2,400 mg total) by mouth 3 (three) times daily with meals. 09/13/18   Meccariello, Bernita Raisin, DO  ?   ? ?Allergies    ?Patient has no known allergies.   ? ?Review of Systems   ?Review of Systems  ?Respiratory:  Positive for shortness of breath.   ?All other systems reviewed and are negative. ? ?Physical Exam ?Updated Vital Signs ?BP (!) 196/83 (BP Location: Right Arm)   Pulse 77   Temp 99.2 ?F (37.3 ?C) (Oral)   Resp (!) 25   Ht 5' (1.524 m)   Wt 70 kg   SpO2 94%   BMI 30.14 kg/m?  ?Physical Exam ?Vitals and nursing note reviewed.  ?Constitutional:   ?   Comments: Chronically ill, tachypneic   ?HENT:  ?   Head: Normocephalic.  ?   Mouth/Throat:  ?   Mouth: Mucous membranes are moist.  ?Eyes:  ?   Extraocular Movements: Extraocular movements intact.  ?   Pupils: Pupils are equal, round, and reactive to light.  ?Cardiovascular:  ?   Rate and Rhythm: Normal rate and regular rhythm.  ?Pulmonary:  ?   Comments: Slightly tachypneic, crackles bilateral bases ?Musculoskeletal:     ?   General: Normal range of motion.  ?   Cervical back: Normal range of motion and neck supple.  ?  Comments: Trace edema bilateral legs   ?Skin: ?   General: Skin is warm.  ?   Capillary Refill: Capillary refill takes less than 2 seconds.  ?Neurological:  ?   General: No focal deficit present.  ?   Mental Status: She is oriented to person, place, and time.  ? ? ?ED Results / Procedures / Treatments   ?Labs ?(all labs ordered are listed, but only abnormal results are displayed) ?Labs Reviewed  ?RESP PANEL BY RT-PCR (FLU A&B, COVID) [id number]  ?CBC WITH DIFFERENTIAL/PLATELET  ?COMPREHENSIVE METABOLIC PANEL  ?BLOOD GAS, VENOUS  ?I-STAT CHEM 8, ED  ? ? ?EKG ?EKG Interpretation ? ?Date/Time:  Sunday August 14 2021 19:39:30 EDT ?Ventricular Rate:  77 ?PR Interval:  177 ?QRS Duration: 93 ?QT Interval:  401 ?QTC Calculation: 628 ?R Axis:   22 ?Text  Interpretation: Sinus rhythm No significant change since last tracing Confirmed by Wandra Arthurs [phone]) on 08/14/2021 7:48:27 PM ? ?Radiology ?No results found. ? ?Procedures ?Procedures  ? ? ?CRITICAL CARE ?Performed by: Wandra Arthurs ? ? ?Total critical care time: 30 minutes ? ?Critical care time was exclusive of separately billable procedures and treating other patients. ? ?Critical care was necessary to treat or prevent imminent or life-threatening deterioration. ? ?Critical care was time spent personally by me on the following activities: development of treatment plan with patient and/or surrogate as well as nursing, discussions with consultants, evaluation of patient's response to treatment, examination of patient, obtaining history from patient or surrogate, ordering and performing treatments and interventions, ordering and review of laboratory studies, ordering and review of radiographic studies, pulse oximetry and re-evaluation of patient's condition. ? ? ?Medications Ordered in ED ?Medications - No data to display ? ?ED Course/ Medical Decision Making/ A&P ?  ?                        ?Medical Decision Making ?Reah Justo is a 76 y.o. female here presenting with shortness of breath and hypoxia.  Patient was hypoxic per EMS.  Patient is due for dialysis tomorrow but did not miss any dialysis.  Concern for possible acute pulmonary edema versus hypokalemia versus covid versus pneumonia.  We will get chest x-ray and VBG and CBC and BMP and COVID test ?  ?9 pm ?Patient's potassium is 5.4.  Chest x-ray showed congestion.  Patient is doing well on 2 L nasal cannula.  I consulted Dr. Jonnie Finner from nephrology.  He states that patient will get dialysis tonight and if he is off oxygen after dialysis, anticipate discharge  ? ?Problems Addressed: ?Acute pulmonary edema ([place]): acute illness or injury ?ESRD (end stage renal disease) ([place]): acute illness or injury ?Hypoxia: acute illness or injury ? ?Amount and/or  Complexity of Data Reviewed ?Labs: ordered. Decision-making details documented in ED Course. ?Radiology: ordered and independent interpretation performed. Decision-making details documented in ED Course. ?ECG/medicine tests: ordered. Decision-making details documented in ED Course. ? ?Risk ?Prescription drug management. ? ? ?Final Clinical Impression(s) / ED Diagnoses ?Final diagnoses:  ?None  ? ? ?Rx / DC Orders ?ED Discharge Orders   ? ? None  ? ?  ? ? ?  ?Drenda Freeze, MD ?08/14/21 2257 ? ?

## 2021-08-15 DIAGNOSIS — R34 Anuria and oliguria: Secondary | ICD-10-CM | POA: Diagnosis present

## 2021-08-15 DIAGNOSIS — R0902 Hypoxemia: Secondary | ICD-10-CM | POA: Diagnosis not present

## 2021-08-15 DIAGNOSIS — Z992 Dependence on renal dialysis: Secondary | ICD-10-CM | POA: Diagnosis not present

## 2021-08-15 DIAGNOSIS — I5032 Chronic diastolic (congestive) heart failure: Secondary | ICD-10-CM | POA: Diagnosis present

## 2021-08-15 DIAGNOSIS — E875 Hyperkalemia: Secondary | ICD-10-CM | POA: Diagnosis present

## 2021-08-15 DIAGNOSIS — N186 End stage renal disease: Secondary | ICD-10-CM | POA: Diagnosis present

## 2021-08-15 DIAGNOSIS — Z809 Family history of malignant neoplasm, unspecified: Secondary | ICD-10-CM | POA: Diagnosis not present

## 2021-08-15 DIAGNOSIS — E785 Hyperlipidemia, unspecified: Secondary | ICD-10-CM | POA: Diagnosis present

## 2021-08-15 DIAGNOSIS — Z8249 Family history of ischemic heart disease and other diseases of the circulatory system: Secondary | ICD-10-CM | POA: Diagnosis not present

## 2021-08-15 DIAGNOSIS — J9601 Acute respiratory failure with hypoxia: Secondary | ICD-10-CM | POA: Diagnosis present

## 2021-08-15 DIAGNOSIS — I700 Atherosclerosis of aorta: Secondary | ICD-10-CM | POA: Diagnosis present

## 2021-08-15 DIAGNOSIS — Z20822 Contact with and (suspected) exposure to covid-19: Secondary | ICD-10-CM | POA: Diagnosis present

## 2021-08-15 DIAGNOSIS — R0602 Shortness of breath: Secondary | ICD-10-CM | POA: Diagnosis present

## 2021-08-15 DIAGNOSIS — J810 Acute pulmonary edema: Secondary | ICD-10-CM | POA: Diagnosis present

## 2021-08-15 DIAGNOSIS — E8770 Fluid overload, unspecified: Secondary | ICD-10-CM

## 2021-08-15 DIAGNOSIS — Z79899 Other long term (current) drug therapy: Secondary | ICD-10-CM | POA: Diagnosis not present

## 2021-08-15 DIAGNOSIS — I132 Hypertensive heart and chronic kidney disease with heart failure and with stage 5 chronic kidney disease, or end stage renal disease: Secondary | ICD-10-CM | POA: Diagnosis present

## 2021-08-15 DIAGNOSIS — D631 Anemia in chronic kidney disease: Secondary | ICD-10-CM | POA: Diagnosis present

## 2021-08-15 DIAGNOSIS — K219 Gastro-esophageal reflux disease without esophagitis: Secondary | ICD-10-CM | POA: Diagnosis present

## 2021-08-15 LAB — CBC
HCT: 25.3 % — ABNORMAL LOW (ref 36.0–46.0)
Hemoglobin: 8.2 g/dL — ABNORMAL LOW (ref 12.0–15.0)
MCH: 27 pg (ref 26.0–34.0)
MCHC: 32.4 g/dL (ref 30.0–36.0)
MCV: 83.2 fL (ref 80.0–100.0)
Platelets: 167 10*3/uL (ref 150–400)
RBC: 3.04 MIL/uL — ABNORMAL LOW (ref 3.87–5.11)
RDW: 17.5 % — ABNORMAL HIGH (ref 11.5–15.5)
WBC: 7.7 10*3/uL (ref 4.0–10.5)
nRBC: 0 % (ref 0.0–0.2)

## 2021-08-15 LAB — COMPREHENSIVE METABOLIC PANEL
ALT: 15 U/L (ref 0–44)
AST: 20 U/L (ref 15–41)
Albumin: 3.1 g/dL — ABNORMAL LOW (ref 3.5–5.0)
Alkaline Phosphatase: 140 U/L — ABNORMAL HIGH (ref 38–126)
Anion gap: 11 (ref 5–15)
BUN: 15 mg/dL (ref 8–23)
CO2: 30 mmol/L (ref 22–32)
Calcium: 8.8 mg/dL — ABNORMAL LOW (ref 8.9–10.3)
Chloride: 98 mmol/L (ref 98–111)
Creatinine, Ser: 4.13 mg/dL — ABNORMAL HIGH (ref 0.44–1.00)
GFR, Estimated: 11 mL/min — ABNORMAL LOW (ref >60)
Glucose, Bld: 112 mg/dL — ABNORMAL HIGH (ref 70–99)
Potassium: 4 mmol/L (ref 3.5–5.1)
Sodium: 139 mmol/L (ref 135–145)
Total Bilirubin: 0.7 mg/dL (ref 0.3–1.2)
Total Protein: 7.1 g/dL (ref 6.5–8.1)

## 2021-08-15 LAB — HEPATITIS B SURFACE ANTIGEN: Hepatitis B Surface Ag: NONREACTIVE

## 2021-08-15 LAB — TSH: TSH: 0.595 u[IU]/mL (ref 0.350–4.500)

## 2021-08-15 LAB — HEPATITIS B SURFACE ANTIBODY,QUALITATIVE: Hep B S Ab: REACTIVE — AB

## 2021-08-15 LAB — PROCALCITONIN: Procalcitonin: 0.33 ng/mL

## 2021-08-15 MED ORDER — AMLODIPINE BESYLATE 10 MG PO TABS
10.0000 mg | ORAL_TABLET | Freq: Every evening | ORAL | Status: DC
  Administered 2021-08-15: 10 mg via ORAL
  Filled 2021-08-15: qty 2

## 2021-08-15 MED ORDER — RENA-VITE PO TABS
1.0000 | ORAL_TABLET | Freq: Every day | ORAL | Status: DC
  Administered 2021-08-15: 1 via ORAL
  Filled 2021-08-15: qty 1

## 2021-08-15 MED ORDER — HYDRALAZINE HCL 25 MG PO TABS
25.0000 mg | ORAL_TABLET | Freq: Two times a day (BID) | ORAL | Status: DC
  Administered 2021-08-15 – 2021-08-16 (×3): 25 mg via ORAL
  Filled 2021-08-15 (×3): qty 1

## 2021-08-15 MED ORDER — METOPROLOL TARTRATE 50 MG PO TABS
50.0000 mg | ORAL_TABLET | Freq: Two times a day (BID) | ORAL | Status: DC
  Administered 2021-08-15 – 2021-08-16 (×3): 50 mg via ORAL
  Filled 2021-08-15 (×2): qty 1
  Filled 2021-08-15: qty 2

## 2021-08-15 MED ORDER — DOXERCALCIFEROL 4 MCG/2ML IV SOLN: 4.0000 ug | INTRAVENOUS | Status: DC

## 2021-08-15 MED ORDER — ATORVASTATIN CALCIUM 10 MG PO TABS
10.0000 mg | ORAL_TABLET | Freq: Every evening | ORAL | Status: DC
  Administered 2021-08-15: 10 mg via ORAL
  Filled 2021-08-15: qty 1

## 2021-08-15 MED ORDER — POLYETHYLENE GLYCOL 3350 17 G PO PACK: 17.0000 g | PACK | Freq: Every day | ORAL | Status: DC | PRN

## 2021-08-15 MED ORDER — HEPARIN SODIUM (PORCINE) 5000 UNIT/ML IJ SOLN
5000.0000 [IU] | Freq: Three times a day (TID) | INTRAMUSCULAR | Status: DC
  Administered 2021-08-15 – 2021-08-16 (×3): 5000 [IU] via SUBCUTANEOUS
  Filled 2021-08-15 (×3): qty 1

## 2021-08-15 MED ORDER — DARBEPOETIN ALFA 60 MCG/0.3ML IJ SOSY
60.0000 ug | PREFILLED_SYRINGE | Freq: Once | INTRAMUSCULAR | Status: AC
  Administered 2021-08-16: 60 ug via INTRAVENOUS
  Filled 2021-08-15 (×2): qty 0.3

## 2021-08-15 MED ORDER — NEPRO/CARBSTEADY PO LIQD
237.0000 mL | Freq: Two times a day (BID) | ORAL | Status: DC
  Administered 2021-08-16: 237 mL via ORAL

## 2021-08-15 MED ORDER — ACETAMINOPHEN 650 MG RE SUPP: 650.0000 mg | Freq: Four times a day (QID) | RECTAL | Status: DC | PRN

## 2021-08-15 MED ORDER — ACETAMINOPHEN 325 MG PO TABS: 650.0000 mg | ORAL_TABLET | Freq: Four times a day (QID) | ORAL | Status: DC | PRN

## 2021-08-15 NOTE — ED Provider Notes (Signed)
Patient returned from dialysis.  On reassessment she is complaining of shortness of breath.  She was still on 2 L nasal cannula.  After taking off, her sats dropped down to 86% on room air while at rest.  She was placed back on 2 L. ? ?We will consult nephrology and medicine to admit the patient for additional rounds of dialysis. ? ?  ?Fatima Blank, MD ?08/15/21 [phone] ? ?

## 2021-08-15 NOTE — ED Notes (Signed)
Nasal 02 removed for trial on room air  pt sleeping ?

## 2021-08-15 NOTE — H&P (Signed)
? ?Date: 08/15/2021     ?Patient Name:  Alexa Dougherty MRN: [id number]  ?DOB: 12-05-1945 Age / Sex: 76 y.o., female   ?PCP: Bartholome Bill, MD    ?     ?Medical Service: Internal Medicine Teaching Service  ?     ?Attending Physician: Dr. Lottie Mussel, MD    ?Intern (1st Contact): Scarlett Presto, MD Pager: AD [phone]  ?Resident (2nd Contact): Virl Axe, MD Pager: Sandrea Hammond [phone]  ?     ?After Hours (After 5p/  First Contact Pager: [phone]  ?weekends / holidays): Second Contact Pager: [phone]  ? ?SUBJECTIVE:  ?Chief Complaint: Shortness of Breath  ? ?History of Present Illness: Alexa Dougherty is a  76 y.o. female with a pertinent PMH of ESRD (MWF), HTN, and HLD, who presents to [facility] with one day of shortness of breath. ? ?Alexa Dougherty was in her usual state of health until yesterday evening when Alexa Dougherty began to feel shortness of breath. Alexa Dougherty denies any fevers, headaches, dizziness/light headedness, nausea, vomiting, cough, chest pain, abdominal pain, or weakness. Alexa Dougherty states that Alexa Dougherty will have chest pain and dizziness, but this only occurs when her blood pressure drops below a systolic of 710.  ? ?Alexa Dougherty does endorse decreased PO intake, but this has been ongoing for the past several months. Alexa Dougherty typically on has an appetite post dialysis sessions, but will skip dinner. Alexa Dougherty denies increased salt intake or having take out. Alexa Dougherty is continuing to take all of her medications, with her last doses of all medications being yesterday evening.  ? ?In the ED, the patient was found to be hypoxic with O2 of 78% on RA, placed on nonrebreathing and saturated to 100%. Her systolic BP was 626. Her initial labs showed hyperkalemia with a K of 5.5. Her CBC showed normocytic anemia of Hgb of 8.1. Nephrology was consulted for dialysis and 2L were taken off. Alexa Dougherty continued to require 2L of supplemental O2, and IMTS was consulted for admission. ? ?Medications: ?No current facility-administered medications on file prior to encounter.   ? ?Current Outpatient Medications on File Prior to Encounter  ?Medication Sig Dispense Refill  ? acetaminophen (TYLENOL) 500 MG tablet Take 500 mg by mouth every 6 (six) hours as needed for moderate pain or headache.    ? amLODipine (NORVASC) 10 MG tablet Take 1 tablet by mouth every evening.    ? atorvastatin (LIPITOR) 10 MG tablet Take 10 mg by mouth every evening.     ? cinacalcet (SENSIPAR) 60 MG tablet Take 60 mg by mouth at bedtime.    ? dexlansoprazole (DEXILANT) 60 MG capsule Take 60 mg by mouth daily.     ? hydrALAZINE (APRESOLINE) 25 MG tablet Take 25 mg by mouth in the morning and at bedtime.    ? lidocaine-prilocaine (EMLA) cream Apply 1 application topically See admin instructions. Apply a small amount to access site (AVF) 1 to 2 hours before dialysis. Cover with occlusive dressing (saran warp).    ? metoprolol tartrate (LOPRESSOR) 50 MG tablet Take 1 tablet (50 mg total) by mouth 2 (two) times daily. 60 tablet 0  ? multivitamin (RENA-VIT) TABS tablet Take 1 tablet by mouth daily.    ? sevelamer carbonate (RENVELA) 800 MG tablet Take 3 tablets (2,400 mg total) by mouth 3 (three) times daily with meals. 90 tablet 0  ? ? ?Past Medical History: ?Past Medical History:  ?Diagnosis Date  ? Anemia   ? Arthritis   ? Right shoulder  ? Blood transfusion without  reported diagnosis   ? Yrs ago when Alexa Dougherty had anemia  ? Cataract   ? bilateral repair  ? Colon polyps   ? ESRD on hemodialysis ([place])   ? Gastritis and gastroduodenitis 12/29/2014  ? GERD (gastroesophageal reflux disease)   ? Headache(784.0)   ? Hx of adenomatous colonic polyps 01/04/2015  ? Hyperlipemia   ? Hypertension   ? ? ?Social:  ?Lives - [address] ?[place],  [place] [address],  ?Occupation - Retired, worked with making tables in the past ?PCP - Luciana Axe, Dola Factor ?Substance use: ?Nonprescription/Illicit - denied.  ?ETOH - none ?Tobacco - denied ? ?Family History: ?Family History  ?Problem Relation Age of Onset  ? Hypertension Mother   ? Cancer  Sister   ?     type unknown  ? Cancer Brother   ?     type unknown  ? Hypertension Daughter   ? Colon cancer Neg Hx   ? Colon polyps Neg Hx   ? Esophageal cancer Neg Hx   ? Stomach cancer Neg Hx   ? Rectal cancer Neg Hx   ? ? ?Allergies: ?Allergies as of 08/14/2021  ? (No Known Allergies)  ? ? ?Review of Systems: ?A complete ROS was negative except as per HPI.  ? ?OBJECTIVE:  ?Physical Exam: ?Blood pressure (!) 170/68, pulse 81, temperature 98.6 ?F (37 ?C), resp. rate (!) 22, height 5' (1.524 m), weight 70 kg, SpO2 97 %. ?Physical Exam ?Constitutional:   ?   Comments: Resting comfortably in bed, NAD, answers questions appropriately.   ?HENT:  ?   Head: Normocephalic and atraumatic.  ?Cardiovascular:  ?   Rate and Rhythm: Normal rate and regular rhythm.  ?   Pulses: Normal pulses.  ?   Heart sounds: Normal heart sounds. No murmur heard. ?  No friction rub. No gallop.  ?   Comments: DP 2+  ?Pulmonary:  ?   Comments: Tachypneic with RR 22-26, no accessory muscle use, saturating at 95% on 2L [place], Crackles appreciated in the lower lobes bilaterally.  ?Abdominal:  ?   General: Abdomen is flat. Bowel sounds are normal. There is no distension.  ?   Palpations: Abdomen is soft.  ?Musculoskeletal:  ?   Comments: Trace pitting edema in the lower extremities bilaterally  ?Neurological:  ?   Mental Status: Alexa Dougherty is oriented to person, place, and time.  ?Psychiatric:     ?   Behavior: Behavior normal.  ? ? ?Pertinent Labs: ?CBC ?   ?Component Value Date/Time  ? WBC 7.7 08/15/2021 1020  ? RBC 3.04 (L) 08/15/2021 1020  ? HGB 8.2 (L) 08/15/2021 1020  ? HCT 25.3 (L) 08/15/2021 1020  ? HCT 27.2 (L) 09/02/2016 0347  ? PLT 167 08/15/2021 1020  ? MCV 83.2 08/15/2021 1020  ? MCH 27.0 08/15/2021 1020  ? MCHC 32.4 08/15/2021 1020  ? RDW 17.5 (H) 08/15/2021 1020  ? LYMPHSABS 1.1 08/14/2021 2002  ? MONOABS 0.8 08/14/2021 2002  ? EOSABS 0.1 08/14/2021 2002  ? BASOSABS 0.1 08/14/2021 2002  ?  ? ?CMP  ?   ?Component Value Date/Time  ? NA 138  08/14/2021 2052  ? NA 138 08/14/2021 2052  ? K 5.4 (H) 08/14/2021 2052  ? K 5.5 (H) 08/14/2021 2052  ? CL 100 08/14/2021 2052  ? CO2 28 08/14/2021 2002  ? GLUCOSE 103 (H) 08/14/2021 2052  ? BUN 57 (H) 08/14/2021 2052  ? CREATININE 10.50 (H) 08/14/2021 2052  ? CALCIUM 8.7 (L) 08/14/2021 2002  ?  CALCIUM 8.3 (L) 12/14/2011 1634  ? PROT 6.8 08/14/2021 2002  ? ALBUMIN 3.1 (L) 08/14/2021 2002  ? AST 18 08/14/2021 2002  ? ALT 13 08/14/2021 2002  ? ALKPHOS 137 (H) 08/14/2021 2002  ? BILITOT 1.1 08/14/2021 2002  ? GFRNONAA 4 (L) 08/14/2021 2002  ? GFRAA 4 (L) 09/13/2018 0435  ? ? ?Pertinent Imaging: ?DG Chest Port 1 View ? ?Result Date: 08/14/2021 ?CLINICAL DATA:  Shortness of breath EXAM: PORTABLE CHEST 1 VIEW COMPARISON:  Chest radiograph dated 11/15/2020. FINDINGS: The heart is enlarged. Vascular calcifications are seen in the aortic arch. Moderate patchy bilateral interstitial and airspace opacities are noted. The left costophrenic angle is not imaged. There is no significant right pleural effusion. There is no pneumothorax. A vascular stent overlies the left axilla. Degenerative changes are seen in the spine. IMPRESSION: Moderate patchy bilateral interstitial and airspace opacities may reflect pulmonary edema and/or pneumonia. Aortic Atherosclerosis (ICD10-I70.0). Electronically Signed   By: Zerita Boers M.D.   On: 08/14/2021 20:55   ? ?EKG: normal EKG, normal sinus rhythm ? ?ASSESSMENT & PLAN:  ?Patient Summary: ?76 y/o F with a PMHx of ESRD, HTN, HLD who presented to ED with [facility] and admitted for hypervolemia in the setting of ESRD and new O2 requirement.  ? ?Assessment: ?Principal Problem: ?  Hypervolemia associated with renal insufficiency ? ? ?Plan: ?#Hypervolemia in the Setting of ESRD (MWF): ?#Hyperkalemia: ?#Severe Asymptomatic Hypertension:  ?Follows with [facility] in the outpatient setting. Completed full dialysis on 3/24. Alexa Dougherty was noted to be [facility] before this session, with resolution of her symptoms.  Alexa Dougherty presents with elevated pressures in the 190s, which may be a contributing factor for her pulmonary edema. Infectious etiology could be present based on CXR for concern for pneumonia, but given no tachycardia, febrile

## 2021-08-15 NOTE — Consult Note (Signed)
 [facility] Renal Consultation Note    Indication for Consultation:  Management of ESRD/hemodialysis; anemia, hypertension/volume and secondary hyperparathyroidism PCP:  HPI: Alexa Dougherty is a 76 y.o. female with ESRD on hemodialysis MWF at [facility]. PMH: HTN, HLD, HFpEF, SHPT, Anemia of ESRD. Patient is compliant with HD. Last HD 08/12/2021 left 0.4 kg above OP EDW. Did have episode of SOB prior to HD 08/12/2021 at OP center which resolved with HD.   Patient presented to ED 08/14/2021 with C/O increased SOB, low O2 sats. She had urgent HD 08/14/2021 for volume overload with Net UF 2.1 liters but when she returned to ED, she still C/Os of SOB. Low O2 sats-88% on RA. Labs are OK today.   Seen in ED. Patient is in low Fowler's position on O2. She says she is breathing better now. Says she is not eating, has been losing weight. She is being admitted with acute hypoxic respiratory failure per primary. Pulmonary edema in setting of loss of body weight.   Past Medical History:  Diagnosis Date   Anemia    Arthritis    Right shoulder   Blood transfusion without reported diagnosis    Yrs ago when she had anemia   Cataract    bilateral repair   Colon polyps    ESRD on hemodialysis (HCC)    Gastritis and gastroduodenitis 12/29/2014   GERD (gastroesophageal reflux disease)    Headache(784.0)    Hx of adenomatous colonic polyps 01/04/2015   Hyperlipemia    Hypertension    Past Surgical History:  Procedure Laterality Date   AV FISTULA PLACEMENT  02/02/2012   Procedure: ARTERIOVENOUS (AV) FISTULA CREATION;  Surgeon: Lonni GORMAN Blade, MD;  Location: [facility] OR;  Service: Vascular;  Laterality: Left;   AV FISTULA REPAIR     Angioplasty of fistula multiple times Left   COLONOSCOPY  2016   COLONOSCOPY WITH PROPOFOL  N/A 12/29/2014   Procedure: COLONOSCOPY WITH PROPOFOL ;  Surgeon: Lupita FORBES Commander, MD;  Location: [facility] ENDOSCOPY;  Service: Endoscopy;  Laterality:  N/A;   ESOPHAGOGASTRODUODENOSCOPY (EGD) WITH PROPOFOL  N/A 12/29/2014   Procedure: ESOPHAGOGASTRODUODENOSCOPY (EGD) WITH PROPOFOL ;  Surgeon: Lupita FORBES Commander, MD;  Location: [facility] ENDOSCOPY;  Service: Endoscopy;  Laterality: N/A;   FISTULOGRAM N/A 04/01/2012   Procedure: FISTULOGRAM;  Surgeon: Lonni GORMAN Blade, MD;  Location: [facility] CATH LAB;  Service: Cardiovascular;  Laterality: N/A;   INSERTION OF DIALYSIS CATHETER  02/02/2012   Procedure: INSERTION OF DIALYSIS CATHETER;  Surgeon: Lonni GORMAN Blade, MD;  Location: [facility] OR;  Service: Vascular;  Laterality: Right;  Insertion of 23cm dialysis catheter in Right Internal Jugular    IR ANGIOGRAM SELECTIVE EACH ADDITIONAL VESSEL  09/06/2020   IR ANGIOGRAM SELECTIVE EACH ADDITIONAL VESSEL  09/06/2020   IR ANGIOGRAM SELECTIVE EACH ADDITIONAL VESSEL  09/06/2020   IR ANGIOGRAM VISCERAL SELECTIVE  09/06/2020   IR EMBO ART  VEN HEMORR LYMPH EXTRAV  INC GUIDE ROADMAPPING  09/06/2020   IR US  GUIDE VASC ACCESS RIGHT  09/06/2020   TUBAL LIGATION  1980   Family History  Problem Relation Age of Onset   Hypertension Mother    Cancer Sister        type unknown   Cancer Brother        type unknown   Hypertension Daughter    Colon cancer Neg Hx    Colon polyps Neg Hx    Esophageal cancer Neg Hx    Stomach cancer Neg Hx    Rectal cancer  Neg Hx    Social History:  reports that she has never smoked. She has never used smokeless tobacco. She reports that she does not drink alcohol and does not use drugs. No Known Allergies Prior to Admission medications   Medication Sig Start Date End Date Taking? Authorizing Provider  acetaminophen  (TYLENOL ) 500 MG tablet Take 500 mg by mouth every 6 (six) hours as needed for moderate pain or headache.   Yes [provider]  amLODipine  (NORVASC ) 10 MG tablet Take 1 tablet by mouth every evening. 10/14/14  Yes [provider]  atorvastatin  (LIPITOR) 10 MG tablet Take 10 mg by mouth every evening.    Yes [provider]  cinacalcet  (SENSIPAR ) 60 MG tablet Take 60 mg by mouth at bedtime.   Yes [provider]  dexlansoprazole (DEXILANT) 60 MG capsule Take 60 mg by mouth daily.    Yes [provider]  hydrALAZINE  (APRESOLINE ) 25 MG tablet Take 25 mg by mouth in the morning and at bedtime.   Yes [provider]  lidocaine -prilocaine  (EMLA ) cream Apply 1 application topically See admin instructions. Apply a small amount to access site (AVF) 1 to 2 hours before dialysis. Cover with occlusive dressing (saran warp). 08/27/18  Yes [provider]  metoprolol  tartrate (LOPRESSOR ) 50 MG tablet Take 1 tablet (50 mg total) by mouth 2 (two) times daily. 09/13/18  Yes Meccariello, Con PARAS, DO  multivitamin (RENA-VIT) TABS tablet Take 1 tablet by mouth daily.   Yes [provider]  sevelamer  carbonate (RENVELA ) 800 MG tablet Take 3 tablets (2,400 mg total) by mouth 3 (three) times daily with meals. 09/13/18  Yes Meccariello, Con PARAS, DO   Current Facility-Administered Medications  Medication Dose Route Frequency Provider Last Rate Last Admin   acetaminophen  (TYLENOL ) tablet 650 mg  650 mg Oral Q6H PRN Carolynn Standing, MD       Or   acetaminophen  (TYLENOL ) suppository 650 mg  650 mg Rectal Q6H PRN Carolynn Standing, MD       amLODipine  (NORVASC ) tablet 10 mg  10 mg Oral QPM Carolynn Standing, MD       atorvastatin  (LIPITOR) tablet 10 mg  10 mg Oral QPM Carolynn Standing, MD       Chlorhexidine  Gluconate Cloth 2 % PADS 6 each  6 each Topical Q0600 Geralynn Charleston, MD       heparin  injection 5,000 Units  5,000 Units Subcutaneous Q8H Carolynn Standing, MD   5,000 Units at 08/15/21 1308   hydrALAZINE  (APRESOLINE ) tablet 25 mg  25 mg Oral BID Carolynn Standing, MD   25 mg at 08/15/21 1406   metoprolol  tartrate (LOPRESSOR ) tablet 50 mg  50 mg Oral BID Carolynn Standing, MD   50 mg at 08/15/21 1406   polyethylene glycol (MIRALAX  / GLYCOLAX ) packet 17 g  17 g Oral Daily PRN Carolynn Standing,  MD       Current Outpatient Medications  Medication Sig Dispense Refill   acetaminophen  (TYLENOL ) 500 MG tablet Take 500 mg by mouth every 6 (six) hours as needed for moderate pain or headache.     amLODipine  (NORVASC ) 10 MG tablet Take 1 tablet by mouth every evening.     atorvastatin  (LIPITOR) 10 MG tablet Take 10 mg by mouth every evening.      cinacalcet  (SENSIPAR ) 60 MG tablet Take 60 mg by mouth at bedtime.     dexlansoprazole (DEXILANT) 60 MG capsule Take 60 mg by mouth daily.      hydrALAZINE  (APRESOLINE )  25 MG tablet Take 25 mg by mouth in the morning and at bedtime.     lidocaine -prilocaine  (EMLA ) cream Apply 1 application topically See admin instructions. Apply a small amount to access site (AVF) 1 to 2 hours before dialysis. Cover with occlusive dressing (saran warp).     metoprolol  tartrate (LOPRESSOR ) 50 MG tablet Take 1 tablet (50 mg total) by mouth 2 (two) times daily. 60 tablet 0   multivitamin (RENA-VIT) TABS tablet Take 1 tablet by mouth daily.     sevelamer  carbonate (RENVELA ) 800 MG tablet Take 3 tablets (2,400 mg total) by mouth 3 (three) times daily with meals. 90 tablet 0   Labs: Basic Metabolic Panel: Recent Labs  Lab 08/14/21 2002 08/14/21 2052 08/15/21 1020  NA 139 138  138 139  K 5.5* 5.4*  5.5* 4.0  CL 97* 100 98  CO2 28  --  30  GLUCOSE 106* 103* 112*  BUN 62* 57* 15  CREATININE 9.59* 10.50* 4.13*  CALCIUM  8.7*  --  8.8*   Liver Function Tests: Recent Labs  Lab 08/14/21 2002 08/15/21 1020  AST 18 20  ALT 13 15  ALKPHOS 137* 140*  BILITOT 1.1 0.7  PROT 6.8 7.1  ALBUMIN  3.1* 3.1*   No results for input(s): LIPASE, AMYLASE in the last 168 hours. No results for input(s): AMMONIA in the last 168 hours. CBC: Recent Labs  Lab 08/14/21 2002 08/14/21 2052 08/15/21 1020  WBC 9.6  --  7.7  NEUTROABS 7.6  --   --   HGB 8.1* 8.8*  8.5* 8.2*  HCT 25.3* 26.0*  25.0* 25.3*  MCV 83.2  --  83.2  PLT 175  --  167   Cardiac Enzymes: No results  for input(s): CKTOTAL, CKMB, CKMBINDEX, TROPONINI in the last 168 hours. CBG: No results for input(s): GLUCAP in the last 168 hours. Iron Studies: No results for input(s): IRON, TIBC, TRANSFERRIN, FERRITIN in the last 72 hours. Studies/Results: DG Chest Port 1 View  Result Date: 08/14/2021 CLINICAL DATA:  Shortness of breath EXAM: PORTABLE CHEST 1 VIEW COMPARISON:  Chest radiograph dated 11/15/2020. FINDINGS: The heart is enlarged. Vascular calcifications are seen in the aortic arch. Moderate patchy bilateral interstitial and airspace opacities are noted. The left costophrenic angle is not imaged. There is no significant right pleural effusion. There is no pneumothorax. A vascular stent overlies the left axilla. Degenerative changes are seen in the spine. IMPRESSION: Moderate patchy bilateral interstitial and airspace opacities may reflect pulmonary edema and/or pneumonia. Aortic Atherosclerosis (ICD10-I70.0). Electronically Signed   By: Norman Hopper M.D.   On: 08/14/2021 20:55    ROS: As per HPI otherwise negative.  Physical Exam: Vitals:   08/15/21 1500 08/15/21 1515 08/15/21 1530 08/15/21 1545  BP: (!) 130/50 (!) 154/65 (!) 177/89 (!) 196/80  Pulse: 75 77 75 75  Resp: (!) 21 20 17  (!) 22  Temp:      TempSrc:      SpO2: 99% 96% 100% 97%  Weight:      Height:         General: Very pleasant, elderly female in no acute distress. Head: Normocephalic, atraumatic, sclera non-icteric, mucus membranes are moist Neck: Supple. JVD not elevated. Lungs: Clear bilaterally to auscultation without wheezes, rales, or rhonchi. Breathing is unlabored. Heart: S1,S2 RRR. No M/R/G. SR on monitor. Rate 70s Abdomen: Soft, non-tender, non-distended with normoactive bowel sounds. No rebound/guarding. No obvious abdominal masses. M-S:  Strength and tone appear normal for age. Lower extremities:Very trace  BLE edema. Neuro: Alert and oriented X 3. Moves all extremities spontaneously. Psych:  Responds to  questions appropriately with a normal affect. Dialysis Access: L AVF +T/B  Dialysis Orders: Center: [facility] MWF 3:45 hrs 180NRe 400/500 67.5 kg 2.0 K/ 2.0 Ca UFP 2 AVF -No heparin  -Mircera 75 mcg IV q 2 weeks (last dose 100 mcg IV 06/22/2021) -Venofer 50 mcg IV -Hectorol  4 mcg IV TIW   Assessment/Plan:  Volume overload: HD again tomorrow off schedule. Hasn't been eating, has lost body mass. Lower EDW on discharge.   ESRD - MWF. HD off schedule tomorrow, again 08/17/2021 to resume T,Th,S schedule. No heparin .   Hypertension/volume  - As noted above. BP elevated. Resume home meds: Amlodipine  10 mg PO q hs, metoprolol  tartrate 50 mg PO BID, Hydralazine  25 mg PO BID.   Anemia  - HGB 8.2. No recent OP ESA documented. Give with HD tomorrow.   Metabolic bone disease -  OP labs at goal. Continue sensipar , hectorol , PO4 low. Hold Renvela  binders.   Nutrition - Albumin  3.1 Add proteins supplements, renal vitamins.   Burle Kwan H. Delores, NP-C 08/15/2021, 4:23 PM  [employer] [phone]

## 2021-08-15 NOTE — ED Notes (Signed)
Pt given [place] sandwich and water per EDP.  ?

## 2021-08-15 NOTE — ED Notes (Signed)
Patient placed on bedpan.

## 2021-08-15 NOTE — ED Notes (Signed)
Admitting MD at bedside.

## 2021-08-15 NOTE — ED Notes (Signed)
Patient wanting to have bowl movement, informed to use bedpan but refused and she and her daughter insisted that patient walk to bath. ?

## 2021-08-15 NOTE — ED Notes (Signed)
The pt was brought back from dialysis   the dialysis nurse reports that he too off  2 liters of fluid.  Pt alert on arrival back to the room reports that she is cold ?

## 2021-08-15 NOTE — ED Notes (Signed)
To dialysis

## 2021-08-16 ENCOUNTER — Other Ambulatory Visit (HOSPITAL_COMMUNITY): Payer: Self-pay

## 2021-08-16 ENCOUNTER — Encounter (HOSPITAL_COMMUNITY): Payer: Self-pay | Admitting: *Deleted

## 2021-08-16 DIAGNOSIS — E8770 Fluid overload, unspecified: Principal | ICD-10-CM

## 2021-08-16 DIAGNOSIS — Z992 Dependence on renal dialysis: Secondary | ICD-10-CM

## 2021-08-16 DIAGNOSIS — N289 Disorder of kidney and ureter, unspecified: Secondary | ICD-10-CM

## 2021-08-16 DIAGNOSIS — J810 Acute pulmonary edema: Secondary | ICD-10-CM

## 2021-08-16 DIAGNOSIS — R0902 Hypoxemia: Secondary | ICD-10-CM

## 2021-08-16 DIAGNOSIS — N186 End stage renal disease: Secondary | ICD-10-CM

## 2021-08-16 LAB — BASIC METABOLIC PANEL
Anion gap: 12 (ref 5–15)
BUN: 32 mg/dL — ABNORMAL HIGH (ref 8–23)
CO2: 30 mmol/L (ref 22–32)
Calcium: 8.6 mg/dL — ABNORMAL LOW (ref 8.9–10.3)
Chloride: 96 mmol/L — ABNORMAL LOW (ref 98–111)
Creatinine, Ser: 6.12 mg/dL — ABNORMAL HIGH (ref 0.44–1.00)
GFR, Estimated: 7 mL/min — ABNORMAL LOW (ref >60)
Glucose, Bld: 91 mg/dL (ref 70–99)
Potassium: 4.5 mmol/L (ref 3.5–5.1)
Sodium: 138 mmol/L (ref 135–145)

## 2021-08-16 LAB — CBC
HCT: 23.7 % — ABNORMAL LOW (ref 36.0–46.0)
Hemoglobin: 7.5 g/dL — ABNORMAL LOW (ref 12.0–15.0)
MCH: 26 pg (ref 26.0–34.0)
MCHC: 31.6 g/dL (ref 30.0–36.0)
MCV: 82 fL (ref 80.0–100.0)
Platelets: 171 10*3/uL (ref 150–400)
RBC: 2.89 MIL/uL — ABNORMAL LOW (ref 3.87–5.11)
RDW: 17.2 % — ABNORMAL HIGH (ref 11.5–15.5)
WBC: 6.9 10*3/uL (ref 4.0–10.5)
nRBC: 0 % (ref 0.0–0.2)

## 2021-08-16 LAB — IRON AND TIBC
Iron: 42 ug/dL (ref 28–170)
Saturation Ratios: 20 % (ref 10.4–31.8)
TIBC: 209 ug/dL — ABNORMAL LOW (ref 250–450)
UIBC: 167 ug/dL

## 2021-08-16 LAB — FERRITIN: Ferritin: 888 ng/mL — ABNORMAL HIGH (ref 11–307)

## 2021-08-16 LAB — HEPATITIS B SURFACE ANTIBODY, QUANTITATIVE: Hep B S AB Quant (Post): 86.4 m[IU]/mL (ref >9.9)

## 2021-08-16 MED ORDER — SODIUM CHLORIDE 0.9 % IV SOLN: 100.0000 mL | INTRAVENOUS | Status: DC | PRN

## 2021-08-16 MED ORDER — HYDRALAZINE HCL 50 MG PO TABS: 50.0000 mg | ORAL_TABLET | Freq: Two times a day (BID) | ORAL | Status: DC

## 2021-08-16 MED ORDER — HYDRALAZINE HCL 25 MG PO TABS
25.0000 mg | ORAL_TABLET | Freq: Three times a day (TID) | ORAL | 0 refills | Status: DC
  Filled 2021-08-16: qty 90, 30d supply, fill #0

## 2021-08-16 MED ORDER — CINACALCET HCL 30 MG PO TABS: 60.0000 mg | ORAL_TABLET | Freq: Every day | ORAL | Status: DC

## 2021-08-16 MED ORDER — HYDRALAZINE HCL 25 MG PO TABS: 25.0000 mg | ORAL_TABLET | Freq: Three times a day (TID) | ORAL | Status: DC

## 2021-08-16 MED ORDER — LIDOCAINE-PRILOCAINE 2.5-2.5 % EX CREA: 1.0000 "application " | TOPICAL_CREAM | CUTANEOUS | Status: DC | PRN

## 2021-08-16 MED ORDER — PENTAFLUOROPROP-TETRAFLUOROETH EX AERO: 1.0000 "application " | INHALATION_SPRAY | CUTANEOUS | Status: DC | PRN

## 2021-08-16 MED ORDER — PANTOPRAZOLE SODIUM 40 MG PO TBEC
40.0000 mg | DELAYED_RELEASE_TABLET | Freq: Every day | ORAL | Status: DC
  Administered 2021-08-16: 40 mg via ORAL
  Filled 2021-08-16 (×2): qty 1

## 2021-08-16 MED ORDER — LIDOCAINE HCL (PF) 1 % IJ SOLN: 5.0000 mL | INTRAMUSCULAR | Status: DC | PRN

## 2021-08-16 NOTE — Progress Notes (Signed)
D/C order noted. Contacted [place] [facility] to make clinic aware of pt's d/c today and pt should resume tomorrow. ? ?Melven Sartorius ?Renal Navigator ?[phone] ?

## 2021-08-16 NOTE — Discharge Instructions (Addendum)
Alexa Dougherty ? ?You were recently admitted to [facility] for shortness of breath. You received extra dialysis sessions in which we were able to pull more fluid off of you and your breathing improved. You may continue your normal dialysis schedule. ? ?Continue taking your home medications with the following changes ? ?Take your hydralazine three times a day rather than twice a day ? ?You should seek further medical care if you have worsening shortness of breath, cough, fever, or dizziness/lightheadedness. ? ?We recommend that you see your primary care doctor in about a week to make sure that you continue to improve. We are so glad that you are feeling better. ? ?Sincerely, ?Scarlett Presto, MD ? ? ?

## 2021-08-16 NOTE — Care Management (Signed)
?  Transition of Care (TOC) Screening Note ? ? ?Patient Details  ?Name: Alexa Dougherty ?Date of Birth: 09-29-1945 ? ? ?Transition of Care (TOC) CM/SW Contact:    ?Carles Collet, RN ?Phone Number: ?08/16/2021, 8:46 AM ? ? ? ?Transition of Care Department [facility]) has reviewed patient and no TOC needs have been identified at this time. We will continue to monitor patient advancement through interdisciplinary progression rounds. If new patient transition needs arise, please place a TOC consult. ?  ?

## 2021-08-16 NOTE — Discharge Summary (Signed)
? ?Name: Alexa Dougherty ?MRN: [id number] ?DOB: 1945-09-20 76 y.o. ?PCP: Bartholome Bill, MD ? ?Date of Admission: 08/14/2021  7:23 PM ?Date of Discharge:   08/16/21 ?Attending Physician: Lottie Mussel, MD ? ?Discharge Diagnosis: ?1. Hypervolemia in the Setting of ESRD (MWF) ?2. Severe Asymptomatic Hypertension ?3. Normocytotic Anemia ?4. HLD ?5. Thickened Endometrial Stripe ?6. Hyperkalemia; resolved ?7. Aortic Atherosclerosis ? ?Discharge Medications: ?Allergies as of 08/16/2021   ?No Known Allergies ?  ? ?  ?Medication List  ?  ? ?TAKE these medications   ? ?acetaminophen 500 MG tablet ?Commonly known as: TYLENOL ?Take 500 mg by mouth every 6 (six) hours as needed for moderate pain or headache. ?  ?amLODipine 10 MG tablet ?Commonly known as: NORVASC ?Take 1 tablet by mouth every evening. ?  ?atorvastatin 10 MG tablet ?Commonly known as: LIPITOR ?Take 10 mg by mouth every evening. ?  ?cinacalcet 60 MG tablet ?Commonly known as: SENSIPAR ?Take 60 mg by mouth at bedtime. ?  ?dexlansoprazole 60 MG capsule ?Commonly known as: DEXILANT ?Take 60 mg by mouth daily. ?  ?hydrALAZINE 25 MG tablet ?Commonly known as: APRESOLINE ?Take 1 tablet (25 mg total) by mouth 3 (three) times daily. ?What changed: when to take this ?  ?lidocaine-prilocaine cream ?Commonly known as: EMLA ?Apply 1 application topically See admin instructions. Apply a small amount to access site (AVF) 1 to 2 hours before dialysis. Cover with occlusive dressing (saran warp). ?  ?metoprolol tartrate 50 MG tablet ?Commonly known as: LOPRESSOR ?Take 1 tablet (50 mg total) by mouth 2 (two) times daily. ?  ?multivitamin Tabs tablet ?Take 1 tablet by mouth daily. ?  ?sevelamer carbonate 800 MG tablet ?Commonly known as: Renvela ?Take 3 tablets (2,400 mg total) by mouth 3 (three) times daily with meals. ?  ? ?  ? ? ?Disposition and follow-up:   ?Ms.Alexa Dougherty was discharged from [facility] in Stable condition.  At the hospital follow up  visit please address: ? ?1.  Shortness of breath/Fluid overload- Assess volume status, new dry weight is 65.8 kg ? ?2. HTN- adjust BP meds as you see fit ? ?3.CBC- consider iron studies ? ?4.  Labs / imaging needed at time of follow-up: cbc ? ?5.  Pending labs/ test needing follow-up: none ? ?Follow-up Appointments: ? ? ?Hospital Course by problem list: ?1.  Hypervolemia in the Setting of ESRD (MWF); Severe Asymptomatic Hypertension ?Patient presented with increasing shortness of breath and a new oxygen requirement on admission despite adherence to her dialysis schedule. Patient reportedly has had a decreased appetite over the last several months and has been eating less and therefore likely losing weight. Her "dry weight" that dialysis was aiming for was likely greater than what her actual dry weight was leading to volume overload. CXR on admission showed patchy bilateral interstital and airspace opacities likely related to pulmonary edema as she did not have any infectious signs or symptoms. She came in and received dialysis on admission as well as on 3/28. Her blood pressures after dialysis were improved, though increased after lunch. Upon further clarification her hydralazine was only being dosed BID rather than TID. Will increase this to 25 mg TID and have her BP rechecked in the outpatient setting. Her standing dry weight today was 65.8 kg after dialysis. ? ?2. Normocytotic Anemia ?Patient has a Hx of iron deficiency anemia and anemia of chronic disease. Hemoglobin was stable and >7, requiring no transfusions. Recommend checking iron studies in the outpatient setting.  ? ?  3. HLD; Aortic atherosclerosis. ?Continued home lipitor. Atherosclerosis noted on CT ? ?4. Thickened Endometrial Stripe ?Incidental finding on recent hospital stay.10.9 mm. Seen by OBGYN recently and declined further intervention. ? ?5. Hyperkalemia ?Elevated on admission. Corrected with dialysis ? ?Discharge Exam:   ?BP (!) 169/72 (BP  Location: Right Arm)   Pulse 77   Temp 98.2 ?F (36.8 ?C) (Oral)   Resp 20   Ht 5' (1.524 m)   Wt 65.8 kg   SpO2 100%   BMI 28.33 kg/m?  ?Discharge exam:  ?YQI:[id number] woman resting comfortably in bed in NAD ?CV:RRR no m/r/g ?Resp: CTAB, normal WOB on room air ?Abd: soft non-tender non-distended ?Neuro: alert, oriented, answering questions appropriately ?Skin:warm and dry ?Psych: normal affect ? ?Pertinent Labs, Studies, and Procedures:  ? ?DG Chest Port 1 View ? ?Result Date: 08/14/2021 ?CLINICAL DATA:  Shortness of breath EXAM: PORTABLE CHEST 1 VIEW COMPARISON:  Chest radiograph dated 11/15/2020. FINDINGS: The heart is enlarged. Vascular calcifications are seen in the aortic arch. Moderate patchy bilateral interstitial and airspace opacities are noted. The left costophrenic angle is not imaged. There is no significant right pleural effusion. There is no pneumothorax. A vascular stent overlies the left axilla. Degenerative changes are seen in the spine. IMPRESSION: Moderate patchy bilateral interstitial and airspace opacities may reflect pulmonary edema and/or pneumonia. Aortic Atherosclerosis (ICD10-I70.0). Electronically Signed   By: Zerita Boers M.D.   On: 08/14/2021 20:55  ?  ? ?  Latest Ref Rng & Units 08/16/2021  ?  1:50 AM 08/15/2021  ? 10:20 AM 08/14/2021  ?  8:52 PM  ?CBC  ?WBC 4.0 - 10.5 K/uL 6.9   7.7     ?Hemoglobin 12.0 - 15.0 g/dL 7.5   8.2   8.8    ? 8.5    ?Hematocrit 36.0 - 46.0 % 23.7   25.3   26.0    ? 25.0    ?Platelets 150 - 400 K/uL 171   167     ?  ? ?  Latest Ref Rng & Units 08/16/2021  ?  1:50 AM 08/15/2021  ? 10:20 AM 08/14/2021  ?  8:52 PM  ?BMP  ?Glucose 70 - 99 mg/dL 91   112   103    ?BUN 8 - 23 mg/dL 32   15   57    ?Creatinine 0.44 - 1.00 mg/dL 6.12   4.13   10.50    ?Sodium 135 - 145 mmol/L 138   139   138    ? 138    ?Potassium 3.5 - 5.1 mmol/L 4.5   4.0   5.4    ? 5.5    ?Chloride 98 - 111 mmol/L 96   98   100    ?CO2 22 - 32 mmol/L 30   30     ?Calcium 8.9 - 10.3 mg/dL 8.6   8.8      ?  ?Discharge Instructions: ?Discharge Instructions   ? ? Diet - low sodium heart healthy   Complete by: As directed ?  ? Increase activity slowly   Complete by: As directed ?  ? Increase activity slowly   Complete by: As directed ?  ? ?  ? ? ?Signed: ?Scarlett Presto, MD ?08/16/2021, 2:13 PM   ?Pager: [phone]  ? ?

## 2021-08-16 NOTE — Procedures (Signed)
I was present at this dialysis session. I have reviewed the session itself and made appropriate changes.  ? ?K 4.5 on 2 K bath.  UF goal 3L today.  Remains on 2L [place].   ? ?Outpt EDW 67.5kg, need standign weight post HD today.   ? ?ESA today with HD.   ? Danley Danker Weights  ? 08/14/21 1945  ?Weight: 70 kg  ? ? ?Recent Labs  ?Lab 08/16/21 ?0150  ?NA 138  ?K 4.5  ?CL 96*  ?CO2 30  ?GLUCOSE 91  ?BUN 32*  ?CREATININE 6.12*  ?CALCIUM 8.6*  ? ? ?Recent Labs  ?Lab 08/14/21 ?2002 08/14/21 ?2052 08/15/21 ?1020 08/16/21 ?0150  ?WBC 9.6  --  7.7 6.9  ?NEUTROABS 7.6  --   --   --   ?HGB 8.1* 8.8*  8.5* 8.2* 7.5*  ?HCT 25.3* 26.0*  25.0* 25.3* 23.7*  ?MCV 83.2  --  83.2 82.0  ?PLT 175  --  167 171  ? ? ?Scheduled Meds: ? amLODipine  10 mg Oral QPM  ? atorvastatin  10 mg Oral QPM  ? Chlorhexidine Gluconate Cloth  6 each Topical Q0600  ? darbepoetin (ARANESP) injection - DIALYSIS  60 mcg Intravenous Once  ? doxercalciferol  4 mcg Intravenous Q M,W,F-HD  ? feeding supplement (NEPRO CARB STEADY)  237 mL Oral BID BM  ? heparin  5,000 Units Subcutaneous Q8H  ? hydrALAZINE  25 mg Oral BID  ? metoprolol tartrate  50 mg Oral BID  ? multivitamin  1 tablet Oral QHS  ? ?Continuous Infusions: ? sodium chloride    ? sodium chloride    ? ?PRN Meds:.sodium chloride, sodium chloride, acetaminophen **OR** acetaminophen, lidocaine (PF), lidocaine-prilocaine, pentafluoroprop-tetrafluoroeth, polyethylene glycol   ?Pearson Grippe  MD ?08/16/2021, 8:26 AM ?  ?

## 2021-08-16 NOTE — Progress Notes (Signed)
Alexa Dougherty to be D/C'd Home per MD order.  Discussed with the patient and all questions fully answered. ? ?VSS, Skin clean, dry and intact without evidence of skin break down, no evidence of skin tears noted. ?IV catheter discontinued intact. Site without signs and symptoms of complications. Dressing and pressure applied. ? ?An After Visit Summary was printed and given to the patient. Patient received prescription. ? ?D/c education completed with patient/family including follow up instructions, medication list, d/c activities limitations if indicated, with other d/c instructions as indicated by MD - patient able to verbalize understanding, all questions fully answered.  ? ?Patient instructed to return to ED, call 911, or call MD for any changes in condition.  ? ?Patient escorted via [place], and D/C home via private auto. ? ?[place] ?08/16/2021 4:19 PM  ?

## 2021-08-16 NOTE — Progress Notes (Signed)
Bp is 178/73 despite having her scheduled  meds last night. Paged IMTS on call number to notify.  ?

## 2021-08-16 NOTE — Progress Notes (Signed)
? ? ?HD#1 ?Subjective:  ?Overnight Events: NAEO ? ? Patient says that she is feeling better. Says that she feels like her normal self now. Her breathing is back to the way it was before this started. She is not in any pain.  ? ?Objective:  ?Vital signs in last 24 hours: ?Vitals:  ? 08/16/21 0000 08/16/21 0200 08/16/21 0400 08/16/21 0730  ?BP: (!) 174/71 (!) 163/56 (!) 178/73 (!) 157/75  ?Pulse: 69 65 67 67  ?Resp: 19 (!) '23 17 18  '$ ?Temp: 98.6 ?F (37 ?C)  98.3 ?F (36.8 ?C)   ?TempSrc: Axillary  Axillary   ?SpO2: 96% 98% 90%   ?Weight:      ?Height:      ? ?Supplemental O2: Nasal Cannula ?SpO2: 90 % ?O2 Flow Rate (L/min): 2 L/min ? ? ?Physical Exam:  ?WER:[id number] woman resting comfortably in bed in NAD ?CV:RRR no m/r/g ?Resp: CTAB, normal WOB on room air ?Abd: soft non-tender non-distended ?Neuro: alert, oriented, answering questions appropriately ?Skin:warm and dry ?Psych: normal affect ? ?Filed Weights  ? 08/14/21 1944  ?Weight: 70 kg  ? ? ? ?Intake/Output Summary (Last 24 hours) at 08/16/2021 0750 ?Last data filed at 08/16/2021 0600 ?Gross per 24 hour  ?Intake 480 ml  ?Output --  ?Net 480 ml  ? ?Net IO Since Admission: -1,621 mL [08/16/21 0750] ? ?Pertinent Labs: ? ?  Latest Ref Rng & Units 08/16/2021  ?  1:50 AM 08/15/2021  ? 10:20 AM 08/14/2021  ?  8:52 PM  ?CBC  ?WBC 4.0 - 10.5 K/uL 6.9   7.7     ?Hemoglobin 12.0 - 15.0 g/dL 7.5   8.2   8.8    ? 8.5    ?Hematocrit 36.0 - 46.0 % 23.7   25.3   26.0    ? 25.0    ?Platelets 150 - 400 K/uL 171   167     ? ? ? ?  Latest Ref Rng & Units 08/16/2021  ?  1:50 AM 08/15/2021  ? 10:20 AM 08/14/2021  ?  8:52 PM  ?CMP  ?Glucose 70 - 99 mg/dL 91   112   103    ?BUN 8 - 23 mg/dL 32   15   57    ?Creatinine 0.44 - 1.00 mg/dL 6.12   4.13   10.50    ?Sodium 135 - 145 mmol/L 138   139   138    ? 138    ?Potassium 3.5 - 5.1 mmol/L 4.5   4.0   5.4    ? 5.5    ?Chloride 98 - 111 mmol/L 96   98   100    ?CO2 22 - 32 mmol/L 30   30     ?Calcium 8.9 - 10.3 mg/dL 8.6   8.8     ?Total Protein 6.5  - 8.1 g/dL  7.1     ?Total Bilirubin 0.3 - 1.2 mg/dL  0.7     ?Alkaline Phos 38 - 126 U/L  140     ?AST 15 - 41 U/L  20     ?ALT 0 - 44 U/L  15     ? ? ?Imaging: ?No results found. ? ?Assessment/Plan:  ? ?Principal Problem: ?  Hypervolemia associated with renal insufficiency ? ? ?Patient Summary: ?Nelida Mandarino is a 76 y.o. with PMH of ESRD, HTN, HLD who presented to ED with [facility] and admitted for hypervolemia in the setting of ESRD and new O2 requirement.  ? ? ?#  Hypervolemia in the Setting of ESRD (MWF): ?#Severe Asymptomatic Hypertension:  ?Suspect patient's dry weight is lower than her recorded baseline leading to insufficient fluid removal with dialysis. Patient received another round of dialysis today with resolution of her hypoxia and shortness of breath. Blood pressures during dialysis were within normal limits during dialysis though they have increased back to the 160-170s after dialysis. Upon further inspection of her medications this may be related to her hydralazine being dosed BID rather than TID. Will increase medication to 25 TID and suggest that this could possibly be increased to 50 TID  in the outpatient setting if pressures remain elevated.  ?- Continue Dialysis sessions with Nephrology, appreciate their assistance. ?- Continue amlodipine 10 mg, increase Hydralazine to 25 mg TID, continue metoprolol 50 mg BID,  ?- Trend BMP  ?- Renal diet with 1223m fluid restriction ?  ?# Normocytotic Anemia ?Patient has a Hx of iron deficiency anemia and anemia of chronic disease. Slightly lower today at 7.5. Iron studies not yet drawn during admission. Will order them today 3/28. ?- Iron Studies ?- Ferritin  ?- Trend CBC  ?- transfuse <7 ?  ?#HLD ?Continue Lipitor 10 mg ?  ?#Thickened Endometrial Stripe:  ?Incidental finding on recent hospital stay.10.9 mm. Seen OBGYN on 11/25/21, patient declined further intervention at the time.  ?- Continue to follow up in OP ? ?#Hyperkalemia; resolved ? ?VTE: heparin  injection 5,000 Units  ?Code: Full Code ?Fluids: none ? ?AScarlett Presto MD ?Internal Medicine Resident PGY-1 ?Pager [phone]?Please contact the on call pager after 5 pm and on weekends at [phone] ? ?

## 2021-08-17 ENCOUNTER — Telehealth: Payer: Self-pay | Admitting: Nephrology

## 2021-08-17 NOTE — Telephone Encounter (Signed)
Transition of care contact from inpatient facility ? ?Date of discharge: 08/16/21 ?Date of contact: 08/17/21 ?Method: Phone ?Spoke to: Patients' daughter  called back  ? ?Patient contacted to discuss transition of care from recent inpatient hospitalization. Patient was admitted to [facility] from 3/26-28/23... with discharge diagnosis of .Marland KitchenMarland KitchenHypervolemia,hyperkalemia,sever hypertension . ? ?Medication changes were reviewed. ? ?Patient will follow up with his/her outpatient HD unit on: MWF ? ? ?

## 2021-08-17 NOTE — Telephone Encounter (Signed)
Transition of care contact from inpatient facility ? ?Date of Discharge: 08/16/21 ?Date of Contact:attempted 08/17/21 ?Method of contact: Phone ? ?Attempted to contact patient to discuss transition of care from inpatient admission. Patient did not answer the phone. Will have follow up  at [facility] MWF ? ?

## 2022-01-16 NOTE — Progress Notes (Unsigned)
[facility] [place]  ASSESSMENT / PLAN: 76 y.o. female with hypopigmentation about a left arm arteriovenous fistula.  Her nephrologist is a bit concerned that this may ulcerate in the near future and has recommended her for revision.  She had similar presentation in 2019 to my partner Dr. Donnetta Hutching.  I again reviewed the risks and benefits from semi-elective revision of fistulas.  I counseled patient that we may lose this access if we do indeed revise it.  She would like to think it over with her family.  I am happy to revise this for her in the near future if she so desires.  CHIEF COMPLAINT: Skin changes over left arm fistula  HISTORY OF PRESENT ILLNESS: Mikeria Valin is a 76 y.o. female dialyzing through a left arm arteriovenous fistula Monday, Wednesday, and Friday.  Her nephrologist has become concerned because of 2 areas of hypopigmentation over the fistula.  The patient has had prolonged bleeding after decannulation.  She has no other complaints.  We spent the bulk of our visit reviewing the rationale for intervention, and the risks, benefits and alternatives to revision.  Past Medical History:  Diagnosis Date   Anemia    Arthritis    Right shoulder   Blood transfusion without reported diagnosis    Yrs ago when she had anemia   Cataract    bilateral repair   Colon polyps    ESRD on hemodialysis ([place])    Gastritis and gastroduodenitis 12/29/2014   GERD (gastroesophageal reflux disease)    Headache(784.0)    Hx of adenomatous colonic polyps 01/04/2015   Hyperlipemia    Hypertension     Past Surgical History:  Procedure Laterality Date   AV FISTULA PLACEMENT  02/02/2012   Procedure: ARTERIOVENOUS (AV) FISTULA CREATION;  Surgeon: Angelia Mould, MD;  Location: [place];  Service: Vascular;  Laterality: Left;   AV FISTULA REPAIR     Angioplasty of fistula multiple times Left   COLONOSCOPY  2016   COLONOSCOPY WITH PROPOFOL N/A 12/29/2014   Procedure:  COLONOSCOPY WITH PROPOFOL;  Surgeon: Gatha [place], MD;  Location: [facility];  Service: Endoscopy;  Laterality: N/A;   ESOPHAGOGASTRODUODENOSCOPY (EGD) WITH PROPOFOL N/A 12/29/2014   Procedure: ESOPHAGOGASTRODUODENOSCOPY (EGD) WITH PROPOFOL;  Surgeon: Gatha [place], MD;  Location: [facility];  Service: Endoscopy;  Laterality: N/A;   FISTULOGRAM N/A 04/01/2012   Procedure: FISTULOGRAM;  Surgeon: Angelia Mould, MD;  [facility]: [facility] CATH LAB;  Service: Cardiovascular;  Laterality: N/A;   INSERTION OF DIALYSIS CATHETER  02/02/2012   Procedure: INSERTION OF DIALYSIS CATHETER;  Surgeon: Angelia Mould, MD;  Location: [place];  Service: Vascular;  Laterality: Right;  Insertion of 23cm dialysis catheter in Right Internal Jugular    IR ANGIOGRAM SELECTIVE EACH ADDITIONAL VESSEL  09/06/2020   IR ANGIOGRAM SELECTIVE EACH ADDITIONAL VESSEL  09/06/2020   IR ANGIOGRAM SELECTIVE EACH ADDITIONAL VESSEL  09/06/2020   IR ANGIOGRAM VISCERAL SELECTIVE  09/06/2020   IR EMBO ART  VEN HEMORR LYMPH EXTRAV  INC GUIDE ROADMAPPING  09/06/2020   IR US GUIDE VASC ACCESS RIGHT  09/06/2020   TUBAL LIGATION  1980    Family History  Problem Relation Age of Onset   Hypertension Mother    Cancer Sister        type unknown   Cancer Brother        type unknown   Hypertension Daughter    Colon cancer Neg Hx    Colon polyps Neg  Hx    Esophageal cancer Neg Hx    Stomach cancer Neg Hx    Rectal cancer Neg Hx     Social History   Socioeconomic History   Marital status: Widowed    Spouse name: Not on file   Number of children: 7   Years of education: Not on file   Highest education level: Not on file  Occupational History   Occupation: retired  Tobacco Use   Smoking status: Never   Smokeless tobacco: Never  Vaping Use   Vaping Use: Never used  Substance and Sexual Activity   Alcohol use: No   Drug use: No   Sexual activity: Not on file  Other Topics Concern   Not on file  Social History Narrative    Not on file   Social Determinants of Health   Financial Resource Strain: Not on file  Food Insecurity: Not on file  Transportation Needs: Not on file  Physical Activity: Not on file  Stress: Not on file  Social Connections: Not on file  Intimate Partner Violence: Not on file    No Known Allergies  Current Outpatient Medications  Medication Sig Dispense Refill   acetaminophen (TYLENOL) 500 MG tablet Take 500 mg by mouth every 6 (six) hours as needed for moderate pain or headache.     amLODipine (NORVASC) 10 MG tablet Take 1 tablet by mouth every evening.     atorvastatin (LIPITOR) 10 MG tablet Take 10 mg by mouth every evening.      cinacalcet (SENSIPAR) 60 MG tablet Take 60 mg by mouth at bedtime.     dexlansoprazole (DEXILANT) 60 MG capsule Take 60 mg by mouth daily.      lidocaine-prilocaine (EMLA) cream Apply 1 application topically See admin instructions. Apply a small amount to access site (AVF) 1 to 2 hours before dialysis. Cover with occlusive dressing (saran warp).     metoprolol tartrate (LOPRESSOR) 50 MG tablet Take 1 tablet (50 mg total) by mouth 2 (two) times daily. 60 tablet 0   multivitamin (RENA-VIT) TABS tablet Take 1 tablet by mouth daily.     sevelamer carbonate (RENVELA) 800 MG tablet Take 3 tablets (2,400 mg total) by mouth 3 (three) times daily with meals. 90 tablet 0   hydrALAZINE (APRESOLINE) 25 MG tablet Take 1 tablet (25 mg total) by mouth 3 (three) times daily. 90 tablet 0   No current facility-administered medications for this visit.    PHYSICAL EXAM Vitals:   01/17/22 1420  BP: (!) 170/90  Pulse: 63  Resp: 20  Temp: 98.2 F (36.8 C)  SpO2: 97%  Weight: 152 lb (68.9 kg)  Height: 5' (1.524 m)    Elderly chronically ill woman in no acute distress Regular rate and rhythm Unlabored breathing Left upper extremity arteriovenous fistula with 2 areas of hypopigmentation.  Multiple areas of chronic scarring.  Smooth thrill throughout the fistula.   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   PERTINENT LABORATORY AND RADIOLOGIC DATA  Most recent CBC    Latest Ref Rng & Units 08/16/2021    1:50 AM 08/15/2021   10:20 AM 08/14/2021    8:52 PM  CBC  WBC 4.0 - 10.5 K/uL 6.9  7.7    Hemoglobin 12.0 - 15.0 g/dL 7.5  8.2  8.8    8.5   Hematocrit 36.0 - 46.0 % 23.7  25.3  26.0    25.0   Platelets 150 - 400 K/uL 171  167  Most recent CMP    Latest Ref Rng & Units 08/16/2021    1:50 AM 08/15/2021   10:20 AM 08/14/2021    8:52 PM  CMP  Glucose 70 - 99 mg/dL 91  112  103   BUN 8 - 23 mg/dL 32  15  57   Creatinine 0.44 - 1.00 mg/dL 6.12  4.13  10.50   Sodium 135 - 145 mmol/L 138  139  138    138   Potassium 3.5 - 5.1 mmol/L 4.5  4.0  5.4    5.5   Chloride 98 - 111 mmol/L 96  98  100   CO2 22 - 32 mmol/L 30  30    Calcium 8.9 - 10.3 mg/dL 8.6  8.8    Total Protein 6.5 - 8.1 g/dL  7.1    Total Bilirubin 0.3 - 1.2 mg/dL  0.7    Alkaline Phos 38 - 126 U/L  140    AST 15 - 41 U/L  20    ALT 0 - 44 U/L  15      Renal function CrCl cannot be calculated (Patient's most recent lab result is older than the maximum 21 days allowed.).  Hgb A1c MFr Bld (%)  Date Value  09/09/2018 4.8    LDL Cholesterol  Date Value Ref Range Status  09/09/2018 66 0 - 99 mg/dL Final    Comment:           Total Cholesterol/HDL:CHD Risk Coronary Heart Disease Risk Table                     Men   Women  1/2 Average Risk   3.4   3.3  Average Risk       5.0   4.4  2 X Average Risk   9.6   7.1  3 X Average Risk  23.4   11.0        Use the calculated Patient Ratio above and the CHD Risk Table to determine the patient's CHD Risk.        ATP III CLASSIFICATION (LDL):  <100     mg/dL   Optimal  100-129  mg/dL   Near or Above                    Optimal  130-159  mg/dL   Borderline  160-189  mg/dL   High  >190     mg/dL   Very High Performed at [facility] [address], [place], [place] [address]     Yevonne Aline. Stanford Breed, MD [facility] Phone Number: [phone] 01/17/2022 5:01 PM  Total time spent on preparing this encounter including chart review, data review, collecting history, examining the patient, coordinating care for this new patient, 45 minutes.  Portions of this report may have been transcribed using voice recognition software.  Every effort has been made to ensure accuracy; however, inadvertent computerized transcription errors may still be present.

## 2022-01-17 ENCOUNTER — Encounter: Payer: Self-pay | Admitting: Vascular Surgery

## 2022-01-17 ENCOUNTER — Ambulatory Visit (INDEPENDENT_AMBULATORY_CARE_PROVIDER_SITE_OTHER): Payer: Medicare Other | Admitting: Vascular Surgery

## 2022-01-17 VITALS — BP 170/90 | HR 63 | Temp 98.2°F | Resp 20 | Ht 60.0 in | Wt 152.0 lb

## 2022-01-17 DIAGNOSIS — Z992 Dependence on renal dialysis: Secondary | ICD-10-CM | POA: Diagnosis not present

## 2022-01-17 DIAGNOSIS — N186 End stage renal disease: Secondary | ICD-10-CM | POA: Diagnosis not present

## 2022-03-13 ENCOUNTER — Emergency Department (HOSPITAL_COMMUNITY)
Admission: EM | Admit: 2022-03-13 | Discharge: 2022-03-13 | Disposition: A | Discharge status: Home / Self Care | Payer: Medicare Other | Attending: Emergency Medicine | Admitting: Emergency Medicine

## 2022-03-13 ENCOUNTER — Emergency Department (HOSPITAL_COMMUNITY): Payer: Medicare Other

## 2022-03-13 DIAGNOSIS — Z992 Dependence on renal dialysis: Secondary | ICD-10-CM | POA: Diagnosis not present

## 2022-03-13 DIAGNOSIS — R079 Chest pain, unspecified: Secondary | ICD-10-CM | POA: Diagnosis present

## 2022-03-13 DIAGNOSIS — D649 Anemia, unspecified: Secondary | ICD-10-CM | POA: Insufficient documentation

## 2022-03-13 DIAGNOSIS — R7989 Other specified abnormal findings of blood chemistry: Secondary | ICD-10-CM | POA: Insufficient documentation

## 2022-03-13 DIAGNOSIS — N186 End stage renal disease: Secondary | ICD-10-CM | POA: Diagnosis not present

## 2022-03-13 DIAGNOSIS — I120 Hypertensive chronic kidney disease with stage 5 chronic kidney disease or end stage renal disease: Secondary | ICD-10-CM | POA: Insufficient documentation

## 2022-03-13 DIAGNOSIS — Z79899 Other long term (current) drug therapy: Secondary | ICD-10-CM | POA: Diagnosis not present

## 2022-03-13 DIAGNOSIS — R5383 Other fatigue: Secondary | ICD-10-CM | POA: Insufficient documentation

## 2022-03-13 LAB — CBC WITH DIFFERENTIAL/PLATELET
Abs Immature Granulocytes: 0.05 10*3/uL (ref 0.00–0.07)
Basophils Absolute: 0.1 10*3/uL (ref 0.0–0.1)
Basophils Relative: 1 %
Eosinophils Absolute: 0.1 10*3/uL (ref 0.0–0.5)
Eosinophils Relative: 1 %
HCT: 33.7 % — ABNORMAL LOW (ref 36.0–46.0)
Hemoglobin: 10.6 g/dL — ABNORMAL LOW (ref 12.0–15.0)
Immature Granulocytes: 1 %
Lymphocytes Relative: 12 %
Lymphs Abs: 1.2 10*3/uL (ref 0.7–4.0)
MCH: 26.8 pg (ref 26.0–34.0)
MCHC: 31.5 g/dL (ref 30.0–36.0)
MCV: 85.1 fL (ref 80.0–100.0)
Monocytes Absolute: 0.8 10*3/uL (ref 0.1–1.0)
Monocytes Relative: 8 %
Neutro Abs: 7.6 10*3/uL (ref 1.7–7.7)
Neutrophils Relative %: 77 %
Platelets: 204 10*3/uL (ref 150–400)
RBC: 3.96 MIL/uL (ref 3.87–5.11)
RDW: 17.6 % — ABNORMAL HIGH (ref 11.5–15.5)
WBC: 9.9 10*3/uL (ref 4.0–10.5)
nRBC: 0 % (ref 0.0–0.2)

## 2022-03-13 LAB — TROPONIN I (HIGH SENSITIVITY): Troponin I (High Sensitivity): 13 ng/L (ref <18)

## 2022-03-13 LAB — COMPREHENSIVE METABOLIC PANEL
ALT: 20 U/L (ref 0–44)
AST: 31 U/L (ref 15–41)
Albumin: 3.5 g/dL (ref 3.5–5.0)
Alkaline Phosphatase: 134 U/L — ABNORMAL HIGH (ref 38–126)
Anion gap: 17 — ABNORMAL HIGH (ref 5–15)
BUN: 20 mg/dL (ref 8–23)
CO2: 30 mmol/L (ref 22–32)
Calcium: 8.6 mg/dL — ABNORMAL LOW (ref 8.9–10.3)
Chloride: 92 mmol/L — ABNORMAL LOW (ref 98–111)
Creatinine, Ser: 4.17 mg/dL — ABNORMAL HIGH (ref 0.44–1.00)
GFR, Estimated: 10 mL/min — ABNORMAL LOW (ref >60)
Glucose, Bld: 102 mg/dL — ABNORMAL HIGH (ref 70–99)
Potassium: 3.9 mmol/L (ref 3.5–5.1)
Sodium: 139 mmol/L (ref 135–145)
Total Bilirubin: 0.6 mg/dL (ref 0.3–1.2)
Total Protein: 8.2 g/dL — ABNORMAL HIGH (ref 6.5–8.1)

## 2022-03-13 LAB — MAGNESIUM: Magnesium: 2.1 mg/dL (ref 1.7–2.4)

## 2022-03-13 LAB — LIPASE, BLOOD: Lipase: 45 U/L (ref 11–51)

## 2022-03-13 LAB — CBG MONITORING, ED: Glucose-Capillary: 109 mg/dL — ABNORMAL HIGH (ref 70–99)

## 2022-03-13 NOTE — ED Provider Notes (Signed)
[facility] EMERGENCY DEPARTMENT Provider Note   CSN: [id number] Arrival date & time: 03/13/22  1104     History  Chief Complaint  Patient presents with   Chest Pain    Alexa Dougherty is a 76 y.o. female.  HPI Patient presents for chest pain.  Onset of chest pain was when she woke up this morning.  She did go to her dialysis session.  She reports that she continued to have chest pain throughout her dialysis session.  She completed 3/4 of her dialysis.  Patient was given 324 of ASA.  When EMS arrived, she reported that her chest pain had resolved.  EMS spoke with her grandson who reports that she does have intermittent chest pains and feels that it is associated with anxiety related to dialysis sessions.  Patient denies any current symptoms.  Vital signs prior to arrival notable for hypertension.  History per daughter: While patient was at dialysis, she endorsed fatigue.  He was for that reason that EMS was called.  Patient has seemed to be in her normal state of health at home yesterday and this morning.    Home Medications Prior to Admission medications   Medication Sig Start Date End Date Taking? Authorizing Provider  acetaminophen (TYLENOL) 500 MG tablet Take 500 mg by mouth every 6 (six) hours as needed for moderate pain or headache.    [provider]  amLODipine (NORVASC) 10 MG tablet Take 1 tablet by mouth every evening. 10/14/14   [provider]  atorvastatin (LIPITOR) 10 MG tablet Take 10 mg by mouth every evening.     [provider]  cinacalcet (SENSIPAR) 60 MG tablet Take 60 mg by mouth at bedtime.    [provider]  dexlansoprazole (DEXILANT) 60 MG capsule Take 60 mg by mouth daily.     [provider]  hydrALAZINE (APRESOLINE) 25 MG tablet Take 1 tablet (25 mg total) by mouth 3 (three) times daily. 08/16/21 09/15/21  Demaio, Roxine Caddy, MD  lidocaine-prilocaine (EMLA) cream Apply 1 application topically See  admin instructions. Apply a small amount to access site (AVF) 1 to 2 hours before dialysis. Cover with occlusive dressing (saran warp). 08/27/18   [provider]  metoprolol tartrate (LOPRESSOR) 50 MG tablet Take 1 tablet (50 mg total) by mouth 2 (two) times daily. 09/13/18   Meccariello, Bernita Raisin, MD  multivitamin (RENA-VIT) TABS tablet Take 1 tablet by mouth daily.    [provider]  sevelamer carbonate (RENVELA) 800 MG tablet Take 3 tablets (2,400 mg total) by mouth 3 (three) times daily with meals. 09/13/18   Meccariello, Bernita Raisin, MD      Allergies    Patient has no known allergies.    Review of Systems   Review of Systems  Constitutional:  Positive for fatigue.  Cardiovascular:  Positive for chest pain.  All other systems reviewed and are negative.   Physical Exam Updated Vital Signs BP (!) 142/72   Pulse 74   Temp 98.4 F (36.9 C) (Oral)   Resp 19   SpO2 94%  Physical Exam Vitals and nursing note reviewed.  Constitutional:      General: She is not in acute distress.    Appearance: She is well-developed. She is ill-appearing (Chronically). She is not toxic-appearing or diaphoretic.  HENT:     Head: Normocephalic and atraumatic.  Eyes:     Conjunctiva/sclera: Conjunctivae normal.  Cardiovascular:     Rate and Rhythm: Normal rate and regular rhythm.  Heart sounds: No murmur heard. Pulmonary:     Effort: Pulmonary effort is normal. No respiratory distress.     Breath sounds: Normal breath sounds. No decreased breath sounds, wheezing, rhonchi or rales.  Abdominal:     Palpations: Abdomen is soft.     Tenderness: There is no abdominal tenderness.  Musculoskeletal:        General: No swelling. Normal range of motion.     Cervical back: Normal range of motion and neck supple.     Right lower leg: No edema.     Left lower leg: No edema.  Skin:    General: Skin is warm and dry.  Neurological:     General: No focal deficit present.     Mental Status:  She is alert and oriented to person, place, and time.  Psychiatric:        Mood and Affect: Mood normal.        Behavior: Behavior normal.     ED Results / Procedures / Treatments   Labs (all labs ordered are listed, but only abnormal results are displayed) Labs Reviewed  COMPREHENSIVE METABOLIC PANEL - Abnormal; Notable for the following components:      Result Value   Chloride 92 (*)    Glucose, Bld 102 (*)    Creatinine, Ser 4.17 (*)    Calcium 8.6 (*)    Total Protein 8.2 (*)    Alkaline Phosphatase 134 (*)    GFR, Estimated 10 (*)    Anion gap 17 (*)    All other components within normal limits  CBC WITH DIFFERENTIAL/PLATELET - Abnormal; Notable for the following components:   Hemoglobin 10.6 (*)    HCT 33.7 (*)    RDW 17.6 (*)    All other components within normal limits  CBG MONITORING, ED - Abnormal; Notable for the following components:   Glucose-Capillary 109 (*)    All other components within normal limits  MAGNESIUM  LIPASE, BLOOD  TROPONIN I (HIGH SENSITIVITY)  TROPONIN I (HIGH SENSITIVITY)    EKG EKG Interpretation  Date/Time:  Monday March 13 2022 11:19:01 EDT Ventricular Rate:  82 PR Interval:  193 QRS Duration: 98 QT Interval:  420 QTC Calculation: 491 R Axis:   48 Text Interpretation: Sinus rhythm Borderline T abnormalities, anterior leads Borderline prolonged QT interval Confirmed by Godfrey Pick [phone]) on 03/13/2022 11:46:34 AM  Radiology DG Chest Portable 1 View  Result Date: 03/13/2022 CLINICAL DATA:  Chest pain during dialysis. EXAM: PORTABLE CHEST 1 VIEW COMPARISON:  AP chest 08/14/2021, chest two views 11/15/2020, and chest CT 09/09/2018 FINDINGS: Cardiac silhouette is again moderately enlarged. Resolution of the prior moderate bilateral interstitial thickening seen on 08/14/2021 radiographs. No definite pulmonary edema. Mild bibasilar bronchovascular crowding and/or linear scarring. No pleural effusion is seen. No pneumothorax. Mild  multilevel degenerative disc changes of the thoracic spine. IMPRESSION: Resolution of the prior moderate bilateral interstitial thickening seen on 08/14/2021 radiographs, likely resolution of the prior pulmonary edema. Electronically Signed   By: Yvonne Kendall M.D.   On: 03/13/2022 12:31    Procedures Procedures    Medications Ordered in ED Medications - No data to display  ED Course/ Medical Decision Making/ A&P                           Medical Decision Making Amount and/or Complexity of Data Reviewed Labs: ordered. Radiology: ordered.   This patient presents to the ED for concern of  fatigue and chest pain, this involves an extensive number of treatment options, and is a complaint that carries with it a high risk of complications and morbidity.  The differential diagnosis includes ACS, fluid shifts during dialysis, pneumonia, metabolic derangements   Co morbidities that complicate the patient evaluation  ESRD, gastritis, anemia, HTN, HLD, GI bleed, arthritis   Additional history obtained:  Additional history obtained from EMS, patient's daughter External records from outside source obtained and reviewed including EMR   Lab Tests:  I Ordered, and personally interpreted labs.  The pertinent results include: Anemia is improved from baseline, there is no leukocytosis.  Creatinine is elevated consistent with ESRD.  There is mild hypochloremia with otherwise normal electrolytes.  Troponin is normal.   Imaging Studies ordered:  I ordered imaging studies including chest x-ray I independently visualized and interpreted imaging which showed no acute findings I agree with the radiologist interpretation   Cardiac Monitoring: / EKG:  The patient was maintained on a cardiac monitor.  I personally viewed and interpreted the cardiac monitored which showed an underlying rhythm of: Sinus rhythm   Problem List / ED Course / Critical interventions / Medication management  Patient is  a 76 year old female who presents via EMS from her dialysis center for initial complaints of fatigue.  She also mentioned a substernal chest pain which has resolved upon her arrival in the ED.  Vital signs prior to arrival were notable for hypertension.  This is improved while in the ED.  Patient also endorsed no symptoms on her arrival and continues to endorse no symptoms on reassessment.  Laboratory work-up, including troponin was ordered.  Patient's EKG shows no concerning changes from prior.  Chest x-ray shows resolution of previous pulmonary edema and interstitial thickening.  This is a reassuring result.  Patient's lab work is also reassuring with no evidence of anemia, no leukocytosis, and normal troponin.  Given that the patient's chest pain began this morning, there has been adequate time elapsed to rule out ACS with single troponin.  Patient requests to go home.  I spoke with her daughter over the telephone.  Patient's daughter had no other concerns.  Patient's grandson arrived and confirmed that patient appears to be in her normal state of health.  Patient continues to deny any symptoms.  She was encouraged to return to the ED at any time for any further concerns.  She was discharged in stable condition.   Social Determinants of Health:  Has access to outpatient care         Final Clinical Impression(s) / ED Diagnoses Final diagnoses:  Chest pain, unspecified type    Rx / DC Orders ED Discharge Orders     None         Godfrey Pick, MD 03/13/22 1449

## 2022-03-13 NOTE — Discharge Instructions (Signed)
Testing done in the emergency department is reassuring.  Return at any time for any new or worsening symptoms of concern.

## 2022-03-13 NOTE — ED Triage Notes (Signed)
Pt BIB [facility] from dialysis d/t CP. Pt was at dialysis, finished 75% of the treatment. Pt had 324 aspirin. Pain is all resolved by now. A&O X4.

## 2022-05-09 ENCOUNTER — Ambulatory Visit (INDEPENDENT_AMBULATORY_CARE_PROVIDER_SITE_OTHER): Payer: Medicare Other | Admitting: Vascular Surgery

## 2022-05-09 ENCOUNTER — Other Ambulatory Visit: Payer: Self-pay

## 2022-05-09 ENCOUNTER — Encounter: Payer: Self-pay | Admitting: Vascular Surgery

## 2022-05-09 VITALS — BP 168/79 | HR 66 | Temp 98.3°F | Resp 20 | Ht 60.0 in | Wt 149.0 lb

## 2022-05-09 DIAGNOSIS — N186 End stage renal disease: Secondary | ICD-10-CM | POA: Diagnosis not present

## 2022-05-09 DIAGNOSIS — Z992 Dependence on renal dialysis: Secondary | ICD-10-CM | POA: Diagnosis not present

## 2022-05-09 NOTE — Progress Notes (Signed)
[facility] OF [place]  ASSESSMENT / PLAN: 76 y.o. female with hypopigmentation about a left arm arteriovenous fistula.  She continues to be advised to undergo revision by her dialysis center and occasionally by her nephrologist.  We again reviewed the risk, benefits, and alternatives for elective revision of aneurysmal fistulas.  To do a fistula revision to avoid any further deterioration of the fistula.  We will plan to do this on a nondialysis day.  Will also place a tunneled dialysis catheter to allow her fistula time to heal  CHIEF COMPLAINT: Skin changes over left arm fistula  HISTORY OF PRESENT ILLNESS: Alexa Dougherty is a 76 y.o. female dialyzing through a left arm arteriovenous fistula Monday, Wednesday, and Friday.  Her nephrologist has become concerned because of 2 areas of hypopigmentation over the fistula.  The patient has had prolonged bleeding after decannulation.  She has no other complaints.  We spent the bulk of our visit reviewing the rationale for intervention, and the risks, benefits and alternatives to revision.  05/09/22: Returns to clinic for reevaluation.  Essentially the fistula appears the same.  She has not developed any ulceration or bleeding.  She continues to be recommended to undergo revision.  She is ready to undergo revision.  Past Medical History:  Diagnosis Date   Anemia    Arthritis    Right shoulder   Blood transfusion without reported diagnosis    Yrs ago when she had anemia   Cataract    bilateral repair   Colon polyps    ESRD on hemodialysis ([place])    Gastritis and gastroduodenitis 12/29/2014   GERD (gastroesophageal reflux disease)    Headache(784.0)    Hx of adenomatous colonic polyps 01/04/2015   Hyperlipemia    Hypertension     Past Surgical History:  Procedure Laterality Date   AV FISTULA PLACEMENT  02/02/2012   Procedure: ARTERIOVENOUS (AV) FISTULA CREATION;  Surgeon: Angelia Mould, MD;  Location: [place];   Service: Vascular;  Laterality: Left;   AV FISTULA REPAIR     Angioplasty of fistula multiple times Left   COLONOSCOPY  2016   COLONOSCOPY WITH PROPOFOL N/A 12/29/2014   Procedure: COLONOSCOPY WITH PROPOFOL;  Surgeon: Gatha Mayer, MD;  [facility];  Service: Endoscopy;  Laterality: N/A;   ESOPHAGOGASTRODUODENOSCOPY (EGD) WITH PROPOFOL N/A 12/29/2014   Procedure: ESOPHAGOGASTRODUODENOSCOPY (EGD) WITH PROPOFOL;  Surgeon: Gatha Mayer, MD;  [facility];  Service: Endoscopy;  Laterality: N/A;   FISTULOGRAM N/A 04/01/2012   Procedure: FISTULOGRAM;  Surgeon: Angelia Mould, MD;  Location: [facility] C[facility] LAB;  Service: Cardiovascular;  Laterality: N/A;   INSERTION OF DIALYSIS CATHETER  02/02/2012   Procedure: INSERTION OF DIALYSIS CATHETER;  Surgeon: Angelia Mould, MD;  Location: [place];  Service: Vascular;  Laterality: Right;  Insertion of 23cm dialysis catheter in Right Internal Jugular    IR ANGIOGRAM SELECTIVE EACH ADDITIONAL VESSEL  09/06/2020   IR ANGIOGRAM SELECTIVE EACH ADDITIONAL VESSEL  09/06/2020   IR ANGIOGRAM SELECTIVE EACH ADDITIONAL VESSEL  09/06/2020   IR ANGIOGRAM VISCERAL SELECTIVE  09/06/2020   IR EMBO ART  VEN HEMORR LYMPH EXTRAV  INC GUIDE ROADMAPPING  09/06/2020   IR US GUIDE VASC ACCESS RIGHT  09/06/2020   TUBAL LIGATION  1980    Family History  Problem Relation Age of Onset   Hypertension Mother    Cancer Sister        type unknown   Cancer Brother  type unknown   Hypertension Daughter    Colon cancer Neg Hx    Colon polyps Neg Hx    Esophageal cancer Neg Hx    Stomach cancer Neg Hx    Rectal cancer Neg Hx     Social History   Socioeconomic History   Marital status: Widowed    Spouse name: Not on file   Number of children: 7   Years of education: Not on file   Highest education level: Not on file  Occupational History   Occupation: retired  Tobacco Use   Smoking status: Never   Smokeless tobacco: Never  Vaping Use    Vaping Use: Never used  Substance and Sexual Activity   Alcohol use: No   Drug use: No   Sexual activity: Not on file  Other Topics Concern   Not on file  Social History Narrative   Not on file   Social Determinants of Health   Financial Resource Strain: Not on file  Food Insecurity: Not on file  Transportation Needs: Not on file  Physical Activity: Not on file  Stress: Not on file  Social Connections: Not on file  Intimate Partner Violence: Not on file    No Known Allergies  Current Outpatient Medications  Medication Sig Dispense Refill   acetaminophen (TYLENOL) 500 MG tablet Take 500 mg by mouth every 6 (six) hours as needed for moderate pain or headache.     amLODipine (NORVASC) 10 MG tablet Take 1 tablet by mouth every evening.     atorvastatin (LIPITOR) 10 MG tablet Take 10 mg by mouth every evening.      cinacalcet (SENSIPAR) 60 MG tablet Take 60 mg by mouth at bedtime.     dexlansoprazole (DEXILANT) 60 MG capsule Take 60 mg by mouth daily.      lidocaine-prilocaine (EMLA) cream Apply 1 application topically See admin instructions. Apply a small amount to access site (AVF) 1 to 2 hours before dialysis. Cover with occlusive dressing (saran warp).     metoprolol tartrate (LOPRESSOR) 50 MG tablet Take 1 tablet (50 mg total) by mouth 2 (two) times daily. 60 tablet 0   multivitamin (RENA-VIT) TABS tablet Take 1 tablet by mouth daily.     sevelamer carbonate (RENVELA) 800 MG tablet Take 3 tablets (2,400 mg total) by mouth 3 (three) times daily with meals. 90 tablet 0   hydrALAZINE (APRESOLINE) 25 MG tablet Take 1 tablet (25 mg total) by mouth 3 (three) times daily. 90 tablet 0   No current facility-administered medications for this visit.    PHYSICAL EXAM Vitals:   05/09/22 1256  BP: (!) 168/79  Pulse: 66  Resp: 20  Temp: 98.3 F (36.8 C)  SpO2: 98%  Weight: 149 lb (67.6 kg)  Height: 5' (1.524 m)    Elderly chronically ill woman in no acute distress Regular rate  and rhythm Unlabored breathing Left upper extremity arteriovenous fistula with 2 areas of hypopigmentation.  Multiple areas of chronic scarring.  Smooth thrill throughout the fistula.  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   PERTINENT LABORATORY AND RADIOLOGIC DATA  Most recent CBC    Latest Ref Rng & Units 03/13/2022   11:15 AM 08/16/2021    1:50 AM 08/15/2021   10:20 AM  CBC  WBC 4.0 - 10.5 K/uL 9.9  6.9  7.7   Hemoglobin 12.0 - 15.0 g/dL 10.6  7.5  8.2   Hematocrit 36.0 - 46.0 % 33.7  23.7  25.3   Platelets 150 -  400 K/uL 204  171  167      Most recent CMP    Latest Ref Rng & Units 03/13/2022   11:15 AM 08/16/2021    1:50 AM 08/15/2021   10:20 AM  CMP  Glucose 70 - 99 mg/dL 102  91  112   BUN 8 - 23 mg/dL 20  32  15   Creatinine 0.44 - 1.00 mg/dL 4.17  6.12  4.13   Sodium 135 - 145 mmol/L 139  138  139   Potassium 3.5 - 5.1 mmol/L 3.9  4.5  4.0   Chloride 98 - 111 mmol/L 92  96  98   CO2 22 - 32 mmol/L '30  30  30   '$ Calcium 8.9 - 10.3 mg/dL 8.6  8.6  8.8   Total Protein 6.5 - 8.1 g/dL 8.2   7.1   Total Bilirubin 0.3 - 1.2 mg/dL 0.6   0.7   Alkaline Phos 38 - 126 U/L 134   140   AST 15 - 41 U/L 31   20   ALT 0 - 44 U/L 20   15     Renal function CrCl cannot be calculated (Patient's most recent lab result is older than the maximum 21 days allowed.).  Hgb A1c MFr Bld (%)  Date Value  09/09/2018 4.8    LDL Cholesterol  Date Value Ref Range Status  09/09/2018 66 0 - 99 mg/dL Final    Comment:           Total Cholesterol/HDL:CHD Risk Coronary Heart Disease Risk Table                     Men   Women  1/2 Average Risk   3.4   3.3  Average Risk       5.0   4.4  2 X Average Risk   9.6   7.1  3 X Average Risk  23.4   11.0        Use the calculated Patient Ratio above and the CHD Risk Table to determine the patient's CHD Risk.        ATP III CLASSIFICATION (LDL):  <100     mg/dL   Optimal  100-129  mg/dL   Near or Above                    Optimal  130-159   mg/dL   Borderline  160-189  mg/dL   High  >190     mg/dL   Very High Performed at [facility] [address] [place],  [address]     Yevonne Aline. Stanford Breed, MD [facility] Phone Number: [phone] 05/09/2022 1:17 PM  Total time spent on preparing this encounter including chart review, data review, collecting history, examining the patient, coordinating care for this new patient, 45 minutes.  Portions of this report may have been transcribed using voice recognition software.  Every effort has been made to ensure accuracy; however, inadvertent computerized transcription errors may still be present.

## 2022-05-09 NOTE — H&P (View-Only) (Signed)
[facility] OF [place]  ASSESSMENT / PLAN: 76 y.o. female with hypopigmentation about a left arm arteriovenous fistula.  She continues to be advised to undergo revision by her dialysis center and occasionally by her nephrologist.  We again reviewed the risk, benefits, and alternatives for elective revision of aneurysmal fistulas.  To do a fistula revision to avoid any further deterioration of the fistula.  We will plan to do this on a nondialysis day.  Will also place a tunneled dialysis catheter to allow her fistula time to heal  CHIEF COMPLAINT: Skin changes over left arm fistula  HISTORY OF PRESENT ILLNESS: Alexa Dougherty is a 76 y.o. female dialyzing through a left arm arteriovenous fistula Monday, Wednesday, and Friday.  Her nephrologist has become concerned because of 2 areas of hypopigmentation over the fistula.  The patient has had prolonged bleeding after decannulation.  She has no other complaints.  We spent the bulk of our visit reviewing the rationale for intervention, and the risks, benefits and alternatives to revision.  05/09/22: Returns to clinic for reevaluation.  Essentially the fistula appears the same.  She has not developed any ulceration or bleeding.  She continues to be recommended to undergo revision.  She is ready to undergo revision.  Past Medical History:  Diagnosis Date   Anemia    Arthritis    Right shoulder   Blood transfusion without reported diagnosis    Yrs ago when she had anemia   Cataract    bilateral repair   Colon polyps    ESRD on hemodialysis ([place])    Gastritis and gastroduodenitis 12/29/2014   GERD (gastroesophageal reflux disease)    Headache(784.0)    Hx of adenomatous colonic polyps 01/04/2015   Hyperlipemia    Hypertension     Past Surgical History:  Procedure Laterality Date   AV FISTULA PLACEMENT  02/02/2012   Procedure: ARTERIOVENOUS (AV) FISTULA CREATION;  Surgeon: Angelia Mould, MD;  Location: [place];   Service: Vascular;  Laterality: Left;   AV FISTULA REPAIR     Angioplasty of fistula multiple times Left   COLONOSCOPY  2016   COLONOSCOPY WITH PROPOFOL N/A 12/29/2014   Procedure: COLONOSCOPY WITH PROPOFOL;  Surgeon: Gatha Mayer, MD;  [facility];  Service: Endoscopy;  Laterality: N/A;   ESOPHAGOGASTRODUODENOSCOPY (EGD) WITH PROPOFOL N/A 12/29/2014   Procedure: ESOPHAGOGASTRODUODENOSCOPY (EGD) WITH PROPOFOL;  Surgeon: Gatha Mayer, MD;  [facility];  Service: Endoscopy;  Laterality: N/A;   FISTULOGRAM N/A 04/01/2012   Procedure: FISTULOGRAM;  Surgeon: Angelia Mould, MD;  Location: [facility] CATH LAB;  Service: Cardiovascular;  Laterality: N/A;   INSERTION OF DIALYSIS CATHETER  02/02/2012   Procedure: INSERTION OF DIALYSIS CATHETER;  Surgeon: Angelia Mould, MD;  Location: [place];  Service: Vascular;  Laterality: Right;  Insertion of 23cm dialysis catheter in Right Internal Jugular    IR ANGIOGRAM SELECTIVE EACH ADDITIONAL VESSEL  09/06/2020   IR ANGIOGRAM SELECTIVE EACH ADDITIONAL VESSEL  09/06/2020   IR ANGIOGRAM SELECTIVE EACH ADDITIONAL VESSEL  09/06/2020   IR ANGIOGRAM VISCERAL SELECTIVE  09/06/2020   IR EMBO ART  VEN HEMORR LYMPH EXTRAV  INC GUIDE ROADMAPPING  09/06/2020   IR US GUIDE VASC ACCESS RIGHT  09/06/2020   TUBAL LIGATION  1980    Family History  Problem Relation Age of Onset   Hypertension Mother    Cancer Sister        type unknown   Cancer Brother  type unknown   Hypertension Daughter    Colon cancer Neg Hx    Colon polyps Neg Hx    Esophageal cancer Neg Hx    Stomach cancer Neg Hx    Rectal cancer Neg Hx     Social History   Socioeconomic History   Marital status: Widowed    Spouse name: Not on file   Number of children: 7   Years of education: Not on file   Highest education level: Not on file  Occupational History   Occupation: retired  Tobacco Use   Smoking status: Never   Smokeless tobacco: Never  Vaping Use    Vaping Use: Never used  Substance and Sexual Activity   Alcohol use: No   Drug use: No   Sexual activity: Not on file  Other Topics Concern   Not on file  Social History Narrative   Not on file   Social Determinants of Health   Financial Resource Strain: Not on file  Food Insecurity: Not on file  Transportation Needs: Not on file  Physical Activity: Not on file  Stress: Not on file  Social Connections: Not on file  Intimate Partner Violence: Not on file    No Known Allergies  Current Outpatient Medications  Medication Sig Dispense Refill   acetaminophen (TYLENOL) 500 MG tablet Take 500 mg by mouth every 6 (six) hours as needed for moderate pain or headache.     amLODipine (NORVASC) 10 MG tablet Take 1 tablet by mouth every evening.     atorvastatin (LIPITOR) 10 MG tablet Take 10 mg by mouth every evening.      cinacalcet (SENSIPAR) 60 MG tablet Take 60 mg by mouth at bedtime.     dexlansoprazole (DEXILANT) 60 MG capsule Take 60 mg by mouth daily.      lidocaine-prilocaine (EMLA) cream Apply 1 application topically See admin instructions. Apply a small amount to access site (AVF) 1 to 2 hours before dialysis. Cover with occlusive dressing (saran warp).     metoprolol tartrate (LOPRESSOR) 50 MG tablet Take 1 tablet (50 mg total) by mouth 2 (two) times daily. 60 tablet 0   multivitamin (RENA-VIT) TABS tablet Take 1 tablet by mouth daily.     sevelamer carbonate (RENVELA) 800 MG tablet Take 3 tablets (2,400 mg total) by mouth 3 (three) times daily with meals. 90 tablet 0   hydrALAZINE (APRESOLINE) 25 MG tablet Take 1 tablet (25 mg total) by mouth 3 (three) times daily. 90 tablet 0   No current facility-administered medications for this visit.    PHYSICAL EXAM Vitals:   05/09/22 1256  BP: (!) 168/79  Pulse: 66  Resp: 20  Temp: 98.3 F (36.8 C)  SpO2: 98%  Weight: 149 lb (67.6 kg)  Height: 5' (1.524 m)    Elderly chronically ill woman in no acute distress Regular rate  and rhythm Unlabored breathing Left upper extremity arteriovenous fistula with 2 areas of hypopigmentation.  Multiple areas of chronic scarring.  Smooth thrill throughout the fistula.  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   PERTINENT LABORATORY AND RADIOLOGIC DATA  Most recent CBC    Latest Ref Rng & Units 03/13/2022   11:15 AM 08/16/2021    1:50 AM 08/15/2021   10:20 AM  CBC  WBC 4.0 - 10.5 K/uL 9.9  6.9  7.7   Hemoglobin 12.0 - 15.0 g/dL 10.6  7.5  8.2   Hematocrit 36.0 - 46.0 % 33.7  23.7  25.3   Platelets 150 -  400 K/uL 204  171  167      Most recent CMP    Latest Ref Rng & Units 03/13/2022   11:15 AM 08/16/2021    1:50 AM 08/15/2021   10:20 AM  CMP  Glucose 70 - 99 mg/dL 102  91  112   BUN 8 - 23 mg/dL 20  32  15   Creatinine 0.44 - 1.00 mg/dL 4.17  6.12  4.13   Sodium 135 - 145 mmol/L 139  138  139   Potassium 3.5 - 5.1 mmol/L 3.9  4.5  4.0   Chloride 98 - 111 mmol/L 92  96  98   CO2 22 - 32 mmol/L '30  30  30   '$ Calcium 8.9 - 10.3 mg/dL 8.6  8.6  8.8   Total Protein 6.5 - 8.1 g/dL 8.2   7.1   Total Bilirubin 0.3 - 1.2 mg/dL 0.6   0.7   Alkaline Phos 38 - 126 U/L 134   140   AST 15 - 41 U/L 31   20   ALT 0 - 44 U/L 20   15     Renal function CrCl cannot be calculated (Patient's most recent lab result is older than the maximum 21 days allowed.).  Hgb A1c MFr Bld (%)  Date Value  09/09/2018 4.8    LDL Cholesterol  Date Value Ref Range Status  09/09/2018 66 0 - 99 mg/dL Final    Comment:           Total Cholesterol/HDL:CHD Risk Coronary Heart Disease Risk Table                     Men   Women  1/2 Average Risk   3.4   3.3  Average Risk       5.0   4.4  2 X Average Risk   9.6   7.1  3 X Average Risk  23.4   11.0        Use the calculated Patient Ratio above and the CHD Risk Table to determine the patient's CHD Risk.        ATP III CLASSIFICATION (LDL):  <100     mg/dL   Optimal  100-129  mg/dL   Near or Above                    Optimal  130-159   mg/dL   Borderline  160-189  mg/dL   High  >190     mg/dL   Very High Performed at [facility] [address] [place], [place] [address]     Yevonne Aline. Stanford Breed, MD [facility] Phone Number: [phone] 05/09/2022 1:17 PM  Total time spent on preparing this encounter including chart review, data review, collecting history, examining the patient, coordinating care for this new patient, 45 minutes.  Portions of this report may have been transcribed using voice recognition software.  Every effort has been made to ensure accuracy; however, inadvertent computerized transcription errors may still be present.

## 2022-05-17 NOTE — Progress Notes (Signed)
S.D.W- Instructions- LVM using voice interpreter Siree # [phone]   Your procedure is scheduled on Thurs., Dec. 28, 2023 from 10:05AM-12:15PM.  Report to [name] Main Entrance "A" at 7:35 A.M., then check in with the Admitting office.  Call this number if you have problems the morning of surgery:  [phone]             If you experience any cold or flu symptoms such as cough, fever, chills, shortness of breath, etc. between now and your scheduled surgery, please notify us at the above         number.  Masks are now required throughout our facilities due to the increasing cases of Covid, Flu, and RSV infections.   Remember:  Do not eat after midnight on Dec. 27th    Take these medicines the morning of surgery with A SIP OF WATER: Dexlansoprazole (DEXILANT)  HydrALAZINE (APRESOLINE)  Metoprolol tartrate (LOPRESSOR)   If Needed: Acetaminophen (TYLENOL)   As of today, STOP taking any Aspirin (unless otherwise instructed by your surgeon) Aleve, Naproxen, Ibuprofen, Motrin, Advil, Goody's, BC's, all herbal medications, fish oil, and all vitamins.          Do not wear jewelry or makeup. Do not wear lotions, powders, perfumes, or deodorant. Do not shave 48 hours prior to surgery.   Do not bring valuables to the hospital. Do not wear nail polish, gel polish, artificial nails, or any other type of covering on natural nails (fingers and toes) If you have artificial nails or gel coating that need to be removed by a nail salon, please have this removed prior to surgery. Artificial nails or gel coating may interfere with anesthesia's ability to adequately monitor your vital signs.  [facility] is not responsible for any belongings or valuables.    Do NOT Smoke (Tobacco/Vaping)  24 hours prior to your procedure  If you use a CPAP at night, you may bring your mask for your overnight stay.   Contacts, glasses, hearing aids, dentures or partials may not be worn into surgery, please bring cases  for these belongings   For patients admitted to the hospital, discharge time will be determined by your treatment team.   Patients discharged the day of surgery will not be allowed to drive home, and someone needs to stay with them for 24 hours.  Special instructions:    Oral Hygiene is also important to reduce your risk of infection.  Remember - BRUSH YOUR TEETH THE MORNING OF SURGERY WITH YOUR REGULAR TOOTHPASTE  [place]- Preparing For Surgery  Before surgery, you can play an important role. Because skin is not sterile, your skin needs to be as free of germs as possible. You can reduce the number of germs on your skin by washing with Antibacterial Soap before surgery.     Please follow these instructions carefully.     Shower the NIGHT BEFORE SURGERY and the MORNING OF SURGERY with Antibacterial Soap.   Pat yourself dry with a CLEAN TOWEL.  Wear CLEAN PAJAMAS to bed the night before surgery  Place CLEAN SHEETS on your bed the night before your surgery  DO NOT SLEEP WITH PETS.  Day of Surgery:  Take a shower with Antibacterial soap. Wear Clean/Comfortable clothing the morning of surgery Do not apply any deodorants/lotions.   Remember to brush your teeth WITH YOUR REGULAR TOOTHPASTE.   If you test positive for Covid, or been in contact with anyone that has tested positive in the last 10  days, please notify your surgeon.  SURGICAL WAITING ROOM VISITATION Patients having surgery or a procedure may have no more than 2 support people in the waiting area - these visitors may rotate.   Children under the age of 79 must have an adult with them who is not the patient. If the patient needs to stay at the hospital during part of their recovery, the visitor guidelines for inpatient rooms apply. Pre-op nurse will coordinate an appropriate time for 1 support person to accompany patient in pre-op.  This support person may not rotate.   Please refer to the [facility] website for the  visitor guidelines for Inpatients (after your surgery is over and you are in a regular room).

## 2022-05-18 ENCOUNTER — Ambulatory Visit (HOSPITAL_COMMUNITY): Payer: Medicare Other

## 2022-05-18 ENCOUNTER — Ambulatory Visit (HOSPITAL_COMMUNITY): Payer: Medicare Other | Admitting: Anesthesiology

## 2022-05-18 ENCOUNTER — Encounter (HOSPITAL_COMMUNITY): Payer: Self-pay | Admitting: Vascular Surgery

## 2022-05-18 ENCOUNTER — Ambulatory Visit (HOSPITAL_COMMUNITY)
Admission: RE | Admit: 2022-05-18 | Discharge: 2022-05-18 | Disposition: A | Discharge status: Home / Self Care | Payer: Medicare Other | Attending: Vascular Surgery | Admitting: Vascular Surgery

## 2022-05-18 ENCOUNTER — Encounter (HOSPITAL_COMMUNITY)
Admission: RE | Disposition: A | Discharge status: Home / Self Care | Payer: Self-pay | Source: Home / Self Care | Attending: Vascular Surgery

## 2022-05-18 ENCOUNTER — Ambulatory Visit (HOSPITAL_BASED_OUTPATIENT_CLINIC_OR_DEPARTMENT_OTHER): Payer: Medicare Other | Admitting: Anesthesiology

## 2022-05-18 ENCOUNTER — Other Ambulatory Visit: Payer: Self-pay

## 2022-05-18 DIAGNOSIS — Z992 Dependence on renal dialysis: Secondary | ICD-10-CM | POA: Diagnosis not present

## 2022-05-18 DIAGNOSIS — K219 Gastro-esophageal reflux disease without esophagitis: Secondary | ICD-10-CM | POA: Diagnosis not present

## 2022-05-18 DIAGNOSIS — M1990 Unspecified osteoarthritis, unspecified site: Secondary | ICD-10-CM | POA: Diagnosis not present

## 2022-05-18 DIAGNOSIS — N186 End stage renal disease: Secondary | ICD-10-CM | POA: Diagnosis not present

## 2022-05-18 DIAGNOSIS — E785 Hyperlipidemia, unspecified: Secondary | ICD-10-CM | POA: Diagnosis not present

## 2022-05-18 DIAGNOSIS — I120 Hypertensive chronic kidney disease with stage 5 chronic kidney disease or end stage renal disease: Secondary | ICD-10-CM

## 2022-05-18 DIAGNOSIS — R519 Headache, unspecified: Secondary | ICD-10-CM | POA: Diagnosis not present

## 2022-05-18 DIAGNOSIS — I728 Aneurysm of other specified arteries: Secondary | ICD-10-CM | POA: Insufficient documentation

## 2022-05-18 DIAGNOSIS — T82898A Other specified complication of vascular prosthetic devices, implants and grafts, initial encounter: Secondary | ICD-10-CM | POA: Insufficient documentation

## 2022-05-18 DIAGNOSIS — X58XXXA Exposure to other specified factors, initial encounter: Secondary | ICD-10-CM | POA: Diagnosis not present

## 2022-05-18 DIAGNOSIS — I700 Atherosclerosis of aorta: Secondary | ICD-10-CM | POA: Diagnosis not present

## 2022-05-18 DIAGNOSIS — D631 Anemia in chronic kidney disease: Secondary | ICD-10-CM

## 2022-05-18 HISTORY — PX: REVISON OF ARTERIOVENOUS FISTULA: SHX6074

## 2022-05-18 HISTORY — PX: INSERTION OF DIALYSIS CATHETER: SHX1324

## 2022-05-18 LAB — POCT I-STAT, CHEM 8
BUN: 43 mg/dL — ABNORMAL HIGH (ref 8–23)
Calcium, Ion: 1.06 mmol/L — ABNORMAL LOW (ref 1.15–1.40)
Chloride: 98 mmol/L (ref 98–111)
Creatinine, Ser: 7.1 mg/dL — ABNORMAL HIGH (ref 0.44–1.00)
Glucose, Bld: 103 mg/dL — ABNORMAL HIGH (ref 70–99)
HCT: 36 % (ref 36.0–46.0)
Hemoglobin: 12.2 g/dL (ref 12.0–15.0)
Potassium: 5.3 mmol/L — ABNORMAL HIGH (ref 3.5–5.1)
Sodium: 138 mmol/L (ref 135–145)
TCO2: 33 mmol/L — ABNORMAL HIGH (ref 22–32)

## 2022-05-18 SURGERY — REVISON OF ARTERIOVENOUS FISTULA
Anesthesia: General | Site: Groin | Laterality: Left

## 2022-05-18 MED ORDER — GLYCOPYRROLATE 0.2 MG/ML IJ SOLN
INTRAMUSCULAR | Status: DC | PRN
  Administered 2022-05-18: .2 mg via INTRAVENOUS

## 2022-05-18 MED ORDER — LIDOCAINE 2% (20 MG/ML) 5 ML SYRINGE
INTRAMUSCULAR | Status: DC | PRN
  Administered 2022-05-18: 60 mg via INTRAVENOUS

## 2022-05-18 MED ORDER — CEFAZOLIN SODIUM-DEXTROSE 2-4 GM/100ML-% IV SOLN
2.0000 g | INTRAVENOUS | Status: AC
  Administered 2022-05-18: 2 g via INTRAVENOUS
  Filled 2022-05-18: qty 100

## 2022-05-18 MED ORDER — FENTANYL CITRATE (PF) 100 MCG/2ML IJ SOLN
INTRAMUSCULAR | Status: AC
  Filled 2022-05-18: qty 2

## 2022-05-18 MED ORDER — ONDANSETRON HCL 4 MG/2ML IJ SOLN
INTRAMUSCULAR | Status: DC | PRN
  Administered 2022-05-18: 4 mg via INTRAVENOUS

## 2022-05-18 MED ORDER — HEPARIN 6000 UNIT IRRIGATION SOLUTION
Status: DC | PRN
  Administered 2022-05-18: 1

## 2022-05-18 MED ORDER — HYDRALAZINE HCL 20 MG/ML IJ SOLN
INTRAMUSCULAR | Status: AC
  Filled 2022-05-18: qty 1

## 2022-05-18 MED ORDER — MIDAZOLAM HCL 2 MG/2ML IJ SOLN
INTRAMUSCULAR | Status: AC
  Filled 2022-05-18: qty 2

## 2022-05-18 MED ORDER — CHLORHEXIDINE GLUCONATE 0.12 % MT SOLN
15.0000 mL | OROMUCOSAL | Status: AC
  Administered 2022-05-18: 15 mL via OROMUCOSAL
  Filled 2022-05-18 (×2): qty 15

## 2022-05-18 MED ORDER — FENTANYL CITRATE (PF) 100 MCG/2ML IJ SOLN
25.0000 ug | INTRAMUSCULAR | Status: DC | PRN
  Administered 2022-05-18: 25 ug via INTRAVENOUS

## 2022-05-18 MED ORDER — LACTATED RINGERS IV SOLN: INTRAVENOUS | Status: DC

## 2022-05-18 MED ORDER — PHENYLEPHRINE 80 MCG/ML (10ML) SYRINGE FOR IV PUSH (FOR BLOOD PRESSURE SUPPORT)
PREFILLED_SYRINGE | INTRAVENOUS | Status: DC | PRN
  Administered 2022-05-18: 80 ug via INTRAVENOUS
  Administered 2022-05-18: 40 ug via INTRAVENOUS
  Administered 2022-05-18: 80 ug via INTRAVENOUS

## 2022-05-18 MED ORDER — CHLORHEXIDINE GLUCONATE 4 % EX LIQD: 60.0000 mL | Freq: Once | CUTANEOUS | Status: DC

## 2022-05-18 MED ORDER — FENTANYL CITRATE (PF) 250 MCG/5ML IJ SOLN
INTRAMUSCULAR | Status: DC | PRN
  Administered 2022-05-18: 50 ug via INTRAVENOUS
  Administered 2022-05-18 (×2): 25 ug via INTRAVENOUS
  Administered 2022-05-18: 50 ug via INTRAVENOUS

## 2022-05-18 MED ORDER — OXYCODONE-ACETAMINOPHEN 5-325 MG PO TABS: 1.0000 | ORAL_TABLET | ORAL | 0 refills | Status: DC | PRN

## 2022-05-18 MED ORDER — DEXAMETHASONE SODIUM PHOSPHATE 10 MG/ML IJ SOLN
INTRAMUSCULAR | Status: DC | PRN
  Administered 2022-05-18: 8 mg via INTRAVENOUS

## 2022-05-18 MED ORDER — PHENYLEPHRINE HCL-NACL 20-0.9 MG/250ML-% IV SOLN
INTRAVENOUS | Status: DC | PRN
  Administered 2022-05-18: 25 ug/min via INTRAVENOUS

## 2022-05-18 MED ORDER — PROPOFOL 10 MG/ML IV BOLUS
INTRAVENOUS | Status: DC | PRN
  Administered 2022-05-18: 50 mg via INTRAVENOUS
  Administered 2022-05-18: 30 mg via INTRAVENOUS

## 2022-05-18 MED ORDER — HEPARIN SODIUM (PORCINE) 1000 UNIT/ML IJ SOLN
INTRAMUSCULAR | Status: DC | PRN
  Administered 2022-05-18: 6200 [IU]

## 2022-05-18 MED ORDER — FENTANYL CITRATE (PF) 250 MCG/5ML IJ SOLN
INTRAMUSCULAR | Status: AC
  Filled 2022-05-18: qty 5

## 2022-05-18 MED ORDER — 0.9 % SODIUM CHLORIDE (POUR BTL) OPTIME
TOPICAL | Status: DC | PRN
  Administered 2022-05-18: 1000 mL

## 2022-05-18 MED ORDER — EPHEDRINE SULFATE-NACL 50-0.9 MG/10ML-% IV SOSY
PREFILLED_SYRINGE | INTRAVENOUS | Status: DC | PRN
  Administered 2022-05-18: 2.5 mg via INTRAVENOUS

## 2022-05-18 MED ORDER — ACETAMINOPHEN 500 MG PO TABS
1000.0000 mg | ORAL_TABLET | Freq: Once | ORAL | Status: AC
  Administered 2022-05-18: 1000 mg via ORAL
  Filled 2022-05-18: qty 2

## 2022-05-18 MED ORDER — SODIUM CHLORIDE 0.9 % IV SOLN: INTRAVENOUS | Status: DC

## 2022-05-18 MED ORDER — PROPOFOL 10 MG/ML IV BOLUS
INTRAVENOUS | Status: AC
  Filled 2022-05-18: qty 20

## 2022-05-18 MED ORDER — HYDRALAZINE HCL 20 MG/ML IJ SOLN
10.0000 mg | Freq: Once | INTRAMUSCULAR | Status: AC
  Administered 2022-05-18: 10 mg via INTRAVENOUS

## 2022-05-18 MED ORDER — ONDANSETRON HCL 4 MG/2ML IJ SOLN: 4.0000 mg | Freq: Once | INTRAMUSCULAR | Status: DC | PRN

## 2022-05-18 SURGICAL SUPPLY — 78 items
ADH SKN CLS APL DERMABOND .7 (GAUZE/BANDAGES/DRESSINGS) ×2
APL PRP STRL LF DISP 70% ISPRP (MISCELLANEOUS) ×2
APL SKNCLS STERI-STRIP NONHPOA (GAUZE/BANDAGES/DRESSINGS) ×2
ARMBAND PINK RESTRICT EXTREMIT (MISCELLANEOUS) ×2 IMPLANT
BAG COUNTER SPONGE SURGICOUNT (BAG) ×2 IMPLANT
BAG DECANTER FOR FLEXI CONT (MISCELLANEOUS) ×2 IMPLANT
BAG SPNG CNTER NS LX DISP (BAG) ×2
BENZOIN TINCTURE PRP APPL 2/3 (GAUZE/BANDAGES/DRESSINGS) ×2 IMPLANT
BIOPATCH RED 1 DISK 7.0 (GAUZE/BANDAGES/DRESSINGS) ×2 IMPLANT
BNDG ELASTIC 4X5.8 VLCR STR LF (GAUZE/BANDAGES/DRESSINGS) IMPLANT
BNDG GAUZE DERMACEA FLUFF 4 (GAUZE/BANDAGES/DRESSINGS) IMPLANT
BNDG GZE DERMACEA 4 6PLY (GAUZE/BANDAGES/DRESSINGS) ×2
CANISTER SUCT 3000ML PPV (MISCELLANEOUS) ×2 IMPLANT
CANNULA VESSEL 3MM 2 BLNT TIP (CANNULA) ×2 IMPLANT
CATH PALINDROME-P 19CM W/VT (CATHETERS) IMPLANT
CATH PALINDROME-P 23CM W/VT (CATHETERS) IMPLANT
CATH PALINDROME-P 28CM W/VT (CATHETERS) IMPLANT
CHLORAPREP W/TINT 26 (MISCELLANEOUS) ×2 IMPLANT
CLIP LIGATING EXTRA MED SLVR (CLIP) IMPLANT
CLIP LIGATING EXTRA SM BLUE (MISCELLANEOUS) IMPLANT
COVER PROBE W GEL 5X96 (DRAPES) ×2 IMPLANT
COVER SURGICAL LIGHT HANDLE (MISCELLANEOUS) ×2 IMPLANT
DERMABOND ADVANCED .7 DNX12 (GAUZE/BANDAGES/DRESSINGS) IMPLANT
DRAPE C-ARM 42X72 X-RAY (DRAPES) ×2 IMPLANT
DRAPE CHEST BREAST 15X10 FENES (DRAPES) ×2 IMPLANT
DRAPE FEMORAL ANGIO 80X135IN (DRAPES) IMPLANT
DRAPE ORTHO SPLIT 77X108 STRL (DRAPES) ×2
DRAPE SURG ORHT 6 SPLT 77X108 (DRAPES) IMPLANT
ELECT REM PT RETURN 9FT ADLT (ELECTROSURGICAL) ×2
ELECTRODE REM PT RTRN 9FT ADLT (ELECTROSURGICAL) ×2 IMPLANT
GAUZE 4X4 16PLY ~~LOC~~+RFID DBL (SPONGE) ×2 IMPLANT
GAUZE SPONGE 4X4 12PLY STRL (GAUZE/BANDAGES/DRESSINGS) ×2 IMPLANT
GAUZE XEROFORM 1X8 LF (GAUZE/BANDAGES/DRESSINGS) IMPLANT
GLOVE BIO SURGEON STRL SZ8 (GLOVE) ×2 IMPLANT
GLOVE BIOGEL PI IND STRL 7.0 (GLOVE) IMPLANT
GOWN STRL REUS W/ TWL LRG LVL3 (GOWN DISPOSABLE) ×4 IMPLANT
GOWN STRL REUS W/ TWL XL LVL3 (GOWN DISPOSABLE) ×2 IMPLANT
GOWN STRL REUS W/TWL LRG LVL3 (GOWN DISPOSABLE) ×4
GOWN STRL REUS W/TWL XL LVL3 (GOWN DISPOSABLE) ×2
INSERT FOGARTY SM (MISCELLANEOUS) IMPLANT
KIT BASIN OR (CUSTOM PROCEDURE TRAY) ×2 IMPLANT
KIT PALINDROME-P 55CM (CATHETERS) IMPLANT
KIT TURNOVER KIT B (KITS) ×2 IMPLANT
NDL 18GX1X1/2 (RX/OR ONLY) (NEEDLE) ×2 IMPLANT
NDL HYPO 25GX1X1/2 BEV (NEEDLE) ×2 IMPLANT
NEEDLE 18GX1X1/2 (RX/OR ONLY) (NEEDLE) ×2 IMPLANT
NEEDLE HYPO 25GX1X1/2 BEV (NEEDLE) ×2 IMPLANT
NS IRRIG 1000ML POUR BTL (IV SOLUTION) ×2 IMPLANT
PACK BASIC III (CUSTOM PROCEDURE TRAY) ×2
PACK CV ACCESS (CUSTOM PROCEDURE TRAY) ×2 IMPLANT
PACK SRG BSC III STRL LF ECLPS (CUSTOM PROCEDURE TRAY) ×2 IMPLANT
PAD ARMBOARD 7.5X6 YLW CONV (MISCELLANEOUS) ×4 IMPLANT
SET MICROPUNCTURE 5F STIFF (MISCELLANEOUS) ×2 IMPLANT
SLING ARM FOAM STRAP LRG (SOFTGOODS) IMPLANT
SLING ARM FOAM STRAP MED (SOFTGOODS) IMPLANT
SOAP 2 % CHG 4 OZ (WOUND CARE) ×2 IMPLANT
STAPLER VISISTAT 35W (STAPLE) IMPLANT
STRIP CLOSURE SKIN 1/2X4 (GAUZE/BANDAGES/DRESSINGS) ×2 IMPLANT
SUT ETHILON 3 0 PS 1 (SUTURE) ×2 IMPLANT
SUT MNCRL AB 4-0 PS2 18 (SUTURE) ×2 IMPLANT
SUT PROLENE 4 0 RB 1 (SUTURE) ×2
SUT PROLENE 4-0 RB1 .5 CRCL 36 (SUTURE) IMPLANT
SUT PROLENE 5 0 C 1 24 (SUTURE) IMPLANT
SUT PROLENE 6 0 BV (SUTURE) ×2 IMPLANT
SUT VIC AB 2-0 CT1 27 (SUTURE) ×2
SUT VIC AB 2-0 CT1 TAPERPNT 27 (SUTURE) IMPLANT
SUT VIC AB 3-0 SH 27 (SUTURE) ×6
SUT VIC AB 3-0 SH 27X BRD (SUTURE) ×2 IMPLANT
SYR 10ML LL (SYRINGE) ×2 IMPLANT
SYR 20ML LL LF (SYRINGE) ×4 IMPLANT
SYR 3ML LL SCALE MARK (SYRINGE) ×2 IMPLANT
SYR 5ML LL (SYRINGE) ×2 IMPLANT
SYR CONTROL 10ML LL (SYRINGE) ×2 IMPLANT
TOWEL GREEN STERILE (TOWEL DISPOSABLE) ×2 IMPLANT
TOWEL GREEN STERILE FF (TOWEL DISPOSABLE) ×4 IMPLANT
UNDERPAD 30X36 HEAVY ABSORB (UNDERPADS AND DIAPERS) ×2 IMPLANT
WATER STERILE IRR 1000ML POUR (IV SOLUTION) ×2 IMPLANT
WIRE TORQFLEX AUST .018X40CM (WIRE) IMPLANT

## 2022-05-18 NOTE — Discharge Instructions (Signed)
   [facility] of [facility]  Discharge Instructions  AV Fistula or Graft Surgery for Dialysis Access  Please refer to the following instructions for your post-procedure care. Your surgeon or physician assistant will discuss any changes with you.  Activity  You may drive the day following your surgery, if you are comfortable and no longer taking prescription pain medication. Resume full activity as the soreness in your incision resolves.  Bathing/Showering  Keep your incision dry for 48 hours. You may not shower if you have a hemodialysis catheter.  Incision Care  Clean your incision with mild soap and water after 48 hours. Pat the area dry with a clean towel. You do not need a bandage unless otherwise instructed. Do not apply any ointments or creams to your incision. Your arm may swell a bit after surgery. To reduce swelling use pillows to elevate your arm so it is above your heart. Your doctor will tell you if you need to lightly wrap your arm with an ACE bandage.  Diet  Resume your normal diet. There are not special food restrictions following this procedure. In order to heal from your surgery, it is CRITICAL to get adequate nutrition. Your body requires vitamins, minerals, and protein. Vegetables are the best source of vitamins and minerals. Vegetables also provide the perfect balance of protein. Processed food has little nutritional value, so try to avoid this.  Medications  Resume taking all of your medications. If your incision is causing pain, you may take over-the counter pain relievers such as acetaminophen (Tylenol). If you were prescribed a stronger pain medication, please be aware these medications can cause nausea and constipation. Prevent nausea by taking the medication with a snack or meal. Avoid constipation by drinking plenty of fluids and eating foods with high amount of fiber, such as fruits, vegetables, and grains.  Do not take Tylenol if you are  taking prescription pain medications.  Follow up Your surgeon may want to see you in the office following your access surgery. If so, this will be arranged at the time of your surgery.  Please call us immediately for any of the following conditions:  Increased pain, redness, drainage (pus) from your incision site Fever of 101 degrees or higher Severe or worsening pain at your incision site Hand pain or numbness.  Reduce your risk of vascular disease:  Stop smoking. If you would like help, call [facility] at 1-800-QUIT-NOW 7201459945) or [place] at [place] your cholesterol Maintain a desired weight Control your diabetes Keep your blood pressure down  Dialysis  It will take several weeks to several months for your new dialysis access to be ready for use. Your surgeon will determine when it is okay to use it. Your nephrologist will continue to direct your dialysis. You can continue to use your Permcath until your new access is ready for use.   05/18/2022 Alexa Dougherty [id number] [birth year]  Surgeon(s): Cherre Robins, MD  Procedure(s): REVISON OF LEFT ARTERIOVENOUS FISTULA INSERTION OF LEFT FEMORAL VEIN TUNNELED DIALYSIS CATHETER   May stick graft immediately   May stick graft on designated area only:   x Do not stick fistula for 6 weeks    If you have any questions, please call the office at [phone].

## 2022-05-18 NOTE — Anesthesia Procedure Notes (Signed)
Procedure Name: LMA Insertion Date/Time: 05/18/2022 10:23 AM  Performed by: Mariea Clonts, CRNAPre-anesthesia Checklist: Patient identified, Emergency Drugs available, Suction available and Patient being monitored Patient Re-evaluated:Patient Re-evaluated prior to induction Oxygen Delivery Method: Circle System Utilized Preoxygenation: Pre-oxygenation with 100% oxygen Induction Type: IV induction Ventilation: Mask ventilation without difficulty LMA: LMA inserted LMA Size: 4.0 Number of attempts: 1 Airway Equipment and Method: Bite block Placement Confirmation: positive ETCO2 Tube secured with: Tape Dental Injury: Teeth and Oropharynx as per pre-operative assessment

## 2022-05-18 NOTE — Transfer of Care (Signed)
Immediate Anesthesia Transfer of Care Note  Patient: Alexa Dougherty  Procedure(s) Performed: REVISON OF LEFT ARTERIOVENOUS FISTULA (Left: Arm Lower) INSERTION OF LEFT FEMORAL VEIN TUNNELED DIALYSIS CATHETER (Left: Groin)  Patient Location: PACU  Anesthesia Type:General  Level of Consciousness: drowsy, patient cooperative, and responds to stimulation  Airway & Oxygen Therapy: Patient Spontanous Breathing  Post-op Assessment: Report given to RN and Post -op Vital signs reviewed and stable  Post vital signs: Reviewed and stable  Last Vitals:  Vitals Value Taken Time  BP 191/86 05/18/22 1215  Temp    Pulse 77 05/18/22 1217  Resp 14 05/18/22 1217  SpO2 95 % 05/18/22 1217  Vitals shown include unvalidated device data.  Last Pain:  Vitals:   05/18/22 0737  TempSrc:   PainSc: 0-No pain      Patients Stated Pain Goal: 0 (56/25/63 8937)  Complications: No notable events documented.

## 2022-05-18 NOTE — Op Note (Signed)
DATE OF SERVICE: 05/18/2022  PATIENT:  Alexa Dougherty  76 y.o. female  PRE-OPERATIVE DIAGNOSIS:  ESRD, left arm aneurysmal fistula  POST-OPERATIVE DIAGNOSIS:  Same  PROCEDURE:   1) revision of left arm arteriovenous fistula 2) left femoral tunneled dialysis catheter  SURGEON:  Surgeon(s) and Role:    * Cherre Robins, MD - Primary  ASSISTANT: Vicente Serene, PA-C  An experienced assistant was required given the complexity of this procedure and the standard of surgical care. My assistant helped with exposure through counter tension, suctioning, ligation and retraction to better visualize the surgical field.  My assistant expedited sewing during the case by following my sutures. Wherever I use the term "we" in the report, my assistant actively helped me with that portion of the procedure.  ANESTHESIA:   general  EBL: minimal  BLOOD ADMINISTERED:none  DRAINS: none   LOCAL MEDICATIONS USED:  NONE  SPECIMEN:  none  COUNTS: confirmed correct.  TOURNIQUET:  none  PATIENT DISPOSITION:  PACU - hemodynamically stable.   Delay start of Pharmacological VTE agent (>24hrs) due to surgical blood loss or risk of bleeding: no  INDICATION FOR PROCEDURE: Alexa Dougherty is a 76 y.o. female with ESRD and aneurysmal arteriovenous fistula. She has been referred to the office for revision twice. She has no frank ulceration, but has been told that the fistula could ulcerate and cause bleeding multiple times. She desires revision. After careful discussion of risks, benefits, and alternatives the patient was offered revision and tunneled dialysis cathter placement. The patient understood and wished to proceed.  OPERATIVE FINDINGS: no appropriate jugular vein for tunelled dialysis catheter placement. Unremarkable left femoral tunneled dialysis catheter.  DESCRIPTION OF PROCEDURE: After identification of the patient in the pre-operative holding area, the patient was transferred to the operating  room. The patient was positioned supine on the operating room table. Anesthesia was induced. The left arm and groins were prepped and draped in standard fashion. A surgical pause was performed confirming correct patient, procedure, and operative location.  Intraoperative ultrasound was used to examine the neck.  No suitable jugular vein was identified for tunneled dialysis catheter use.  I tried to cannulate both jugular veins and was unable to pass wire.  The right subclavian vein was appropriately sized on ultrasound, but I could not thread a wire through this vein either.  I abandoned the brachiocephalic vessels and elected to place a femoral tunneled dialysis catheter.  Using intraoperative ultrasound the left common femoral vein was accessed with Seldinger technique.  An 035 J-wire was advanced into the IVC.  Over the wire, the venotomy was serially dilated.  A peel-away sheath was introduced into the venotomy.  A 55 cm tunneled dialysis catheter was tunneled through a counterincision in the lateral thigh to the access site and then into the inferior vena cava.  Ensure there is no kinking in the catheter.  Intraoperative x-ray confirmed position in the central caval area.  The catheter flushed and withdrew appropriately.  The catheter was heparin locked.  The catheter was secured to the skin.  Caps were applied to the catheter.  Attention was turned to the left arm fistula.  An ellipse incision was made around the affected skin.  The incision was carried down through the skin to expose the fistula throughout the upper arm.  Fistula was skeletonized to allow revision.  Clamps were applied proximally and distally.  The skin and adherent fistula was sharply excised with a Mayo scissor.  2 aneurysms were noted in  the fistula.  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  The fistula was then repaired using 2 layer closure of 5-0 Prolene in the central aneurysm and 4-0 Prolene in the peripheral aneurysm.   The repair was de-aired.  Clamps were then released on the inflow.  Good thrill was confirmed in the fistula.  The wound was closed in layers using 2-0 Vicryl, 3-0 Vicryl, and a skin stapler.  A bulky bandage was applied.  Upon completion of the case instrument and sharps counts were confirmed correct. The patient was transferred to the PACU in good condition. I was present for all portions of the procedure.  Yevonne Aline. Stanford Breed, MD [facility] Phone Number: [phone] 05/18/2022 12:05 PM

## 2022-05-18 NOTE — Anesthesia Preprocedure Evaluation (Addendum)
Anesthesia Evaluation  Patient identified by MRN, date of birth, ID band Patient awake    Reviewed: Allergy & Precautions, NPO status , Patient's Chart, lab work & pertinent test results, reviewed documented beta blocker date and time   Airway Mallampati: II  TM Distance: >3 FB Neck ROM: Full    Dental  (+) Dental Advisory Given, Poor Dentition, Missing, Loose   Pulmonary neg pulmonary ROS   Pulmonary exam normal breath sounds clear to auscultation       Cardiovascular hypertension, Pt. on medications and Pt. on home beta blockers Normal cardiovascular exam Rhythm:Regular Rate:Normal     Neuro/Psych  Headaches    GI/Hepatic Neg liver ROS,GERD  Medicated,,  Endo/Other  negative endocrine ROS    Renal/GU ESRF and DialysisRenal disease     Musculoskeletal  (+) Arthritis ,    Abdominal   Peds  Hematology  (+) Blood dyscrasia, anemia   Anesthesia Other Findings Day of surgery medications reviewed with the patient.  Reproductive/Obstetrics                             Anesthesia Physical Anesthesia Plan  ASA: 3  Anesthesia Plan: General   Post-op Pain Management: Tylenol PO (pre-op)*   Induction: Intravenous  PONV Risk Score and Plan: 3 and Treatment may vary due to age or medical condition, Ondansetron and Dexamethasone  Airway Management Planned: LMA  Additional Equipment:   Intra-op Plan:   Post-operative Plan: Extubation in OR  Informed Consent: I have reviewed the patients History and Physical, chart, labs and discussed the procedure including the risks, benefits and alternatives for the proposed anesthesia with the patient or authorized representative who has indicated his/her understanding and acceptance.     Dental advisory given  Plan Discussed with: CRNA  Anesthesia Plan Comments:        Anesthesia Quick Evaluation

## 2022-05-18 NOTE — Interval H&P Note (Signed)
History and Physical Interval Note:  05/18/2022 9:35 AM  Alexa Dougherty  has presented today for surgery, with the diagnosis of End Stage Renal Disease.  The various methods of treatment have been discussed with the patient and family. After consideration of risks, benefits and other options for treatment, the patient has consented to  Procedure(s): REVISON OF LEFT ARTERIOVENOUS FISTULA (Left) INSERTION OF TUNNELED DIALYSIS CATHETER (N/A) as a surgical intervention.  The patient's history has been reviewed, patient examined, no change in status, stable for surgery.  I have reviewed the patient's chart and labs.  Questions were answered to the patient's satisfaction.     Cherre Robins

## 2022-05-19 DIAGNOSIS — N185 Chronic kidney disease, stage 5: Secondary | ICD-10-CM | POA: Diagnosis not present

## 2022-05-19 DIAGNOSIS — T82898A Other specified complication of vascular prosthetic devices, implants and grafts, initial encounter: Secondary | ICD-10-CM

## 2022-05-23 ENCOUNTER — Encounter (HOSPITAL_COMMUNITY): Payer: Self-pay | Admitting: Vascular Surgery

## 2022-05-23 NOTE — Anesthesia Postprocedure Evaluation (Signed)
Anesthesia Post Note  Patient: Alexa Dougherty  Procedure(s) Performed: REVISON OF LEFT ARTERIOVENOUS FISTULA (Left: Arm Lower) INSERTION OF LEFT FEMORAL VEIN TUNNELED DIALYSIS CATHETER (Left: Groin)     Patient location during evaluation: PACU Anesthesia Type: General Level of consciousness: sedated and patient cooperative Pain management: pain level controlled Vital Signs Assessment: post-procedure vital signs reviewed and stable Respiratory status: spontaneous breathing Cardiovascular status: stable Anesthetic complications: no   No notable events documented.  Last Vitals:  Vitals:   05/18/22 1300 05/18/22 1315  BP: (!) 187/82 (!) 167/73  Pulse: 75 78  Resp: 11 19  Temp:  (!) 36.3 C  SpO2: 95% 94%    Last Pain:  Vitals:   05/18/22 1215  TempSrc:   PainSc: [place]

## 2022-06-09 ENCOUNTER — Encounter (HOSPITAL_COMMUNITY): Payer: Self-pay | Admitting: *Deleted

## 2022-06-20 ENCOUNTER — Encounter: Payer: Self-pay | Admitting: Physician Assistant

## 2022-06-20 ENCOUNTER — Ambulatory Visit (INDEPENDENT_AMBULATORY_CARE_PROVIDER_SITE_OTHER): Payer: 59 | Admitting: Physician Assistant

## 2022-06-20 VITALS — BP 181/79 | HR 68 | Temp 97.3°F

## 2022-06-20 DIAGNOSIS — Z992 Dependence on renal dialysis: Secondary | ICD-10-CM

## 2022-06-20 DIAGNOSIS — N186 End stage renal disease: Secondary | ICD-10-CM

## 2022-06-20 NOTE — Progress Notes (Signed)
    Postoperative Access Visit   History of Present Illness   Alexa Dougherty is a 77 y.o. year old female who presents for postoperative follow-up for: revision of left arm AV fistula and placement of left femoral tunneled dialysis catheter by Dr. Stanford Breed on 05/19/22.  Her initial left brachiocephalic AV fistula was created in 2013 by Dr. Scot Dock. She developed aneurysmal area concerning for bleeding so she requested revision.  She presents with her daughter today who helps assist with translating. The patient's wounds are well healed.  The patient notes no steal symptoms.  The patient is able to complete their activities of daily living. She is not happy with her left femoral catheter. It is hard for her to sleep comfortably with it and also very eager to shower.  She currently dialyzes via her femoral TDC on MWF at the [facility] location   Physical Examination   Vitals:   06/20/22 1325  BP: (!) 181/79  Pulse: 68  Temp: (!) 97.3 F (36.3 C)  TempSrc: Temporal  SpO2: 97%   There is no height or weight on file to calculate BMI.  left arm Incision is healing well, 2+ radial pulse, hand grip is 5/5, sensation in digits is intact, palpable thrill, bruit can be auscultated. Staples removed    Medical Decision Making   Alexa Dougherty is a 77 y.o. year old female who presents s/p revision of left arm AV fistula and placement of left femoral tunneled dialysis catheter by Dr. Stanford Breed on 05/19/22.  Left arm is healing well. Staples removed today. She needs a couple more weeks of healing before I would recommend accessing fistula.  Patent is without signs or symptoms of steal syndrome Instructions provided on washing incision with mild soap and water The patient's access will be ready for use after 07/03/22 The patient's tunneled dialysis catheter can be removed when Nephrology is comfortable with the performance of the left AV fistula The patient may follow up on a prn basis   Karoline Caldwell, PA-C [facility] [place]: [facility] MD: Roxanne Mins

## 2022-08-01 ENCOUNTER — Inpatient Hospital Stay (HOSPITAL_COMMUNITY)
Admission: EM | Admit: 2022-08-01 | Discharge: 2022-08-04 | DRG: 356 | Disposition: A | Discharge status: Home / Self Care | Payer: 59 | Attending: Internal Medicine | Admitting: Internal Medicine

## 2022-08-01 ENCOUNTER — Telehealth (HOSPITAL_COMMUNITY): Payer: Self-pay | Admitting: Student

## 2022-08-01 ENCOUNTER — Emergency Department (HOSPITAL_COMMUNITY): Payer: 59

## 2022-08-01 ENCOUNTER — Encounter (HOSPITAL_COMMUNITY): Payer: Self-pay

## 2022-08-01 ENCOUNTER — Other Ambulatory Visit: Payer: Self-pay

## 2022-08-01 DIAGNOSIS — Z86010 Personal history of colonic polyps: Secondary | ICD-10-CM | POA: Diagnosis not present

## 2022-08-01 DIAGNOSIS — Z992 Dependence on renal dialysis: Secondary | ICD-10-CM

## 2022-08-01 DIAGNOSIS — M898X9 Other specified disorders of bone, unspecified site: Secondary | ICD-10-CM | POA: Diagnosis present

## 2022-08-01 DIAGNOSIS — K219 Gastro-esophageal reflux disease without esophagitis: Secondary | ICD-10-CM | POA: Diagnosis present

## 2022-08-01 DIAGNOSIS — I120 Hypertensive chronic kidney disease with stage 5 chronic kidney disease or end stage renal disease: Secondary | ICD-10-CM | POA: Diagnosis present

## 2022-08-01 DIAGNOSIS — N2581 Secondary hyperparathyroidism of renal origin: Secondary | ICD-10-CM | POA: Diagnosis present

## 2022-08-01 DIAGNOSIS — K5731 Diverticulosis of large intestine without perforation or abscess with bleeding: Principal | ICD-10-CM | POA: Diagnosis present

## 2022-08-01 DIAGNOSIS — Z79899 Other long term (current) drug therapy: Secondary | ICD-10-CM

## 2022-08-01 DIAGNOSIS — N186 End stage renal disease: Secondary | ICD-10-CM | POA: Diagnosis present

## 2022-08-01 DIAGNOSIS — D631 Anemia in chronic kidney disease: Secondary | ICD-10-CM | POA: Diagnosis present

## 2022-08-01 DIAGNOSIS — K921 Melena: Secondary | ICD-10-CM

## 2022-08-01 DIAGNOSIS — K922 Gastrointestinal hemorrhage, unspecified: Principal | ICD-10-CM

## 2022-08-01 DIAGNOSIS — M1990 Unspecified osteoarthritis, unspecified site: Secondary | ICD-10-CM | POA: Diagnosis present

## 2022-08-01 DIAGNOSIS — Z8719 Personal history of other diseases of the digestive system: Secondary | ICD-10-CM

## 2022-08-01 DIAGNOSIS — D62 Acute posthemorrhagic anemia: Secondary | ICD-10-CM | POA: Diagnosis present

## 2022-08-01 DIAGNOSIS — I708 Atherosclerosis of other arteries: Secondary | ICD-10-CM | POA: Diagnosis present

## 2022-08-01 DIAGNOSIS — I953 Hypotension of hemodialysis: Secondary | ICD-10-CM | POA: Diagnosis not present

## 2022-08-01 DIAGNOSIS — Z8249 Family history of ischemic heart disease and other diseases of the circulatory system: Secondary | ICD-10-CM

## 2022-08-01 DIAGNOSIS — E785 Hyperlipidemia, unspecified: Secondary | ICD-10-CM | POA: Diagnosis present

## 2022-08-01 HISTORY — PX: IR EMBO ART  VEN HEMORR LYMPH EXTRAV  INC GUIDE ROADMAPPING: IMG5450

## 2022-08-01 HISTORY — PX: IR ANGIOGRAM SELECTIVE EACH ADDITIONAL VESSEL: IMG667

## 2022-08-01 HISTORY — PX: IR ANGIOGRAM VISCERAL SELECTIVE: IMG657

## 2022-08-01 HISTORY — PX: IR US GUIDE VASC ACCESS RIGHT: IMG2390

## 2022-08-01 LAB — COMPREHENSIVE METABOLIC PANEL
ALT: 18 U/L (ref 0–44)
AST: 42 U/L — ABNORMAL HIGH (ref 15–41)
Albumin: 3.9 g/dL (ref 3.5–5.0)
Alkaline Phosphatase: 121 U/L (ref 38–126)
Anion gap: 12 (ref 5–15)
BUN: 37 mg/dL — ABNORMAL HIGH (ref 8–23)
CO2: 28 mmol/L (ref 22–32)
Calcium: 8.6 mg/dL — ABNORMAL LOW (ref 8.9–10.3)
Chloride: 94 mmol/L — ABNORMAL LOW (ref 98–111)
Creatinine, Ser: 6 mg/dL — ABNORMAL HIGH (ref 0.44–1.00)
GFR, Estimated: 7 mL/min — ABNORMAL LOW (ref >60)
Glucose, Bld: 108 mg/dL — ABNORMAL HIGH (ref 70–99)
Potassium: 5.3 mmol/L — ABNORMAL HIGH (ref 3.5–5.1)
Sodium: 134 mmol/L — ABNORMAL LOW (ref 135–145)
Total Bilirubin: 0.9 mg/dL (ref 0.3–1.2)
Total Protein: 8.3 g/dL — ABNORMAL HIGH (ref 6.5–8.1)

## 2022-08-01 LAB — CBC WITH DIFFERENTIAL/PLATELET
Abs Immature Granulocytes: 0.02 10*3/uL (ref 0.00–0.07)
Basophils Absolute: 0.1 10*3/uL (ref 0.0–0.1)
Basophils Relative: 1 %
Eosinophils Absolute: 0.2 10*3/uL (ref 0.0–0.5)
Eosinophils Relative: 3 %
HCT: 32.9 % — ABNORMAL LOW (ref 36.0–46.0)
Hemoglobin: 10.3 g/dL — ABNORMAL LOW (ref 12.0–15.0)
Immature Granulocytes: 0 %
Lymphocytes Relative: 12 %
Lymphs Abs: 0.9 10*3/uL (ref 0.7–4.0)
MCH: 28 pg (ref 26.0–34.0)
MCHC: 31.3 g/dL (ref 30.0–36.0)
MCV: 89.4 fL (ref 80.0–100.0)
Monocytes Absolute: 0.7 10*3/uL (ref 0.1–1.0)
Monocytes Relative: 9 %
Neutro Abs: 5.7 10*3/uL (ref 1.7–7.7)
Neutrophils Relative %: 75 %
Platelets: 166 10*3/uL (ref 150–400)
RBC: 3.68 MIL/uL — ABNORMAL LOW (ref 3.87–5.11)
RDW: 15.1 % (ref 11.5–15.5)
WBC: 7.6 10*3/uL (ref 4.0–10.5)
nRBC: 0 % (ref 0.0–0.2)

## 2022-08-01 LAB — TYPE AND SCREEN
ABO/RH(D): A POS
Antibody Screen: NEGATIVE

## 2022-08-01 LAB — TSH: TSH: 1.619 u[IU]/mL (ref 0.350–4.500)

## 2022-08-01 LAB — HEPATITIS B SURFACE ANTIGEN: Hepatitis B Surface Ag: NONREACTIVE

## 2022-08-01 LAB — HEMOGLOBIN AND HEMATOCRIT, BLOOD
HCT: 31 % — ABNORMAL LOW (ref 36.0–46.0)
HCT: 28.3 % — ABNORMAL LOW (ref 36.0–46.0)
Hemoglobin: 9.7 g/dL — ABNORMAL LOW (ref 12.0–15.0)
Hemoglobin: 8.8 g/dL — ABNORMAL LOW (ref 12.0–15.0)

## 2022-08-01 LAB — PROTIME-INR
INR: 1 (ref 0.8–1.2)
Prothrombin Time: 12.9 seconds (ref 11.4–15.2)

## 2022-08-01 LAB — POTASSIUM: Potassium: 4.7 mmol/L (ref 3.5–5.1)

## 2022-08-01 MED ORDER — PANTOPRAZOLE SODIUM 40 MG IV SOLR
40.0000 mg | Freq: Once | INTRAVENOUS | Status: AC
  Administered 2022-08-01: 40 mg via INTRAVENOUS
  Filled 2022-08-01: qty 10

## 2022-08-01 MED ORDER — MIDAZOLAM HCL 2 MG/2ML IJ SOLN
INTRAMUSCULAR | Status: AC | PRN
  Administered 2022-08-01 (×2): 1 mg via INTRAVENOUS

## 2022-08-01 MED ORDER — ALBUTEROL SULFATE (2.5 MG/3ML) 0.083% IN NEBU: 2.5000 mg | INHALATION_SOLUTION | RESPIRATORY_TRACT | Status: DC | PRN

## 2022-08-01 MED ORDER — IOHEXOL 300 MG/ML  SOLN
100.0000 mL | Freq: Once | INTRAMUSCULAR | Status: AC | PRN
  Administered 2022-08-01: 10 mL via INTRA_ARTERIAL

## 2022-08-01 MED ORDER — MIDAZOLAM HCL 2 MG/2ML IJ SOLN
INTRAMUSCULAR | Status: AC
  Filled 2022-08-01: qty 4

## 2022-08-01 MED ORDER — IOHEXOL 350 MG/ML SOLN
80.0000 mL | Freq: Once | INTRAVENOUS | Status: AC | PRN
  Administered 2022-08-01: 80 mL via INTRAVENOUS

## 2022-08-01 MED ORDER — ONDANSETRON HCL 4 MG PO TABS: 4.0000 mg | ORAL_TABLET | Freq: Four times a day (QID) | ORAL | Status: DC | PRN

## 2022-08-01 MED ORDER — IOHEXOL 300 MG/ML  SOLN
100.0000 mL | Freq: Once | INTRAMUSCULAR | Status: AC | PRN
  Administered 2022-08-01: 30 mL via INTRA_ARTERIAL

## 2022-08-01 MED ORDER — ACETAMINOPHEN 325 MG PO TABS: 650.0000 mg | ORAL_TABLET | Freq: Four times a day (QID) | ORAL | Status: DC | PRN

## 2022-08-01 MED ORDER — ACETAMINOPHEN 650 MG RE SUPP: 650.0000 mg | Freq: Four times a day (QID) | RECTAL | Status: DC | PRN

## 2022-08-01 MED ORDER — CHLORHEXIDINE GLUCONATE CLOTH 2 % EX PADS
6.0000 | MEDICATED_PAD | Freq: Every day | CUTANEOUS | Status: DC
  Administered 2022-08-02 – 2022-08-04 (×3): 6 via TOPICAL

## 2022-08-01 MED ORDER — FENTANYL CITRATE (PF) 100 MCG/2ML IJ SOLN
INTRAMUSCULAR | Status: AC | PRN
  Administered 2022-08-01 (×2): 50 ug via INTRAVENOUS

## 2022-08-01 MED ORDER — GELATIN ABSORBABLE 12-7 MM EX MISC
1.0000 | Freq: Once | CUTANEOUS | Status: AC
  Administered 2022-08-01: 1 via TOPICAL

## 2022-08-01 MED ORDER — SODIUM CHLORIDE (PF) 0.9 % IJ SOLN
INTRAMUSCULAR | Status: AC
  Filled 2022-08-01: qty 50

## 2022-08-01 MED ORDER — LIDOCAINE HCL 1 % IJ SOLN
INTRAMUSCULAR | Status: AC | PRN
  Administered 2022-08-01: 5 mL

## 2022-08-01 MED ORDER — PANTOPRAZOLE SODIUM 40 MG IV SOLR
40.0000 mg | INTRAVENOUS | Status: DC
  Administered 2022-08-02 – 2022-08-04 (×3): 40 mg via INTRAVENOUS
  Filled 2022-08-01 (×3): qty 10

## 2022-08-01 MED ORDER — GELATIN ABSORBABLE 12-7 MM EX MISC
CUTANEOUS | Status: AC
  Filled 2022-08-01: qty 2

## 2022-08-01 MED ORDER — LIDOCAINE HCL 1 % IJ SOLN
INTRAMUSCULAR | Status: AC
  Filled 2022-08-01: qty 20

## 2022-08-01 MED ORDER — ONDANSETRON HCL 4 MG/2ML IJ SOLN: 4.0000 mg | Freq: Four times a day (QID) | INTRAMUSCULAR | Status: DC | PRN

## 2022-08-01 MED ORDER — FENTANYL CITRATE (PF) 100 MCG/2ML IJ SOLN
INTRAMUSCULAR | Status: AC
  Filled 2022-08-01: qty 2

## 2022-08-01 NOTE — Procedures (Signed)
Interventional Radiology Procedure Note  Procedure: Mesenteric arteriography of SMA and branches; embolization of transverse colonic branch  Complications: None  Estimated Blood Loss: < 10 mL  Findings: Focal active bleeding identified from distal arcade branch supplying distal transverse colon at splenic flexure off of transverse colonic artery of SMA. Able to partially engage branch of distal arcade, but not deploy a coil due to arterial dissection of branch and tenuous access with microcatheter. Gelfoam embolization therefore performed in distal transverse colonic arterial supply with slurry of Gelfoam pledgets. No further active bleeding identified after embolization.  Arteriotomy closed with AngioSeal device.  Venetia Night. Kathlene Cote, M.D Pager:  [phone]

## 2022-08-01 NOTE — H&P (Signed)
History and Physical    Alexa Dougherty [id number] DOB: Dec 30, 1945 DOA: 08/01/2022  PCP: Bartholome Bill, MD  Patient coming from: home  I have personally briefly reviewed patient's old medical records in [place]  Chief Complaint:  brbpr starting 2:30 am day of presentation   HPI: Alexa Dougherty is a 77 y.o. female with medical history significant of  PMH ESRD on HD MWF , anemia, GERD, Hypertension, hyperlipidemia, history of diverticular bleed one year ago for which she required IR intervention and PRBCS, patient presents to ED with 3 episode of bloody stools that started 2:30 am day of presentation. Patient noted no associated pain, n/v, fall , presyncope, chest pain or sob associated with symptoms.   Patient due to complicated bleeding and history of ESRD will be admitted to [facility] for renal /gi/ surgical care as needed.    ED Course:  Vitals:afeb, bp 182/68, hr 68, rr 16, sat 98%  Labs Na 134, K 5.3, cr 6 Wbc 7, hgb 10.3>,9.7,> current 8.8 Inr 1.0  Tsh 1.6  CTA abd IMPRESSION: 1. 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 2. Atherosclerosis with ~ 50% narrowing at the SMA and right common iliac arteries. 3. Additional chronic findings are described above.   Due to acute bleeding noted on CTA She underwent IR Gel-Foam embolization of distal transverse colonic arterial supply as coil embolization could not be performed due to dissection of an arterial branch supplying the region of focal bleeding along the mesenteric aspect of distal transverse colonic arterial supply. The dissection did eventually open back up but sub however intervention was not possible.  Patient was also seen by Gi Dr Loletha Carrow  In ED. Rec continued npo status h/h q6h and transfusion if < 8 , as well as re consult IR if patient has further bleeding  GI will continue to follow   Tx protonix 40 mg iv daily   2 more episode of bleeding during ed course non so far since  embolization. Review of Systems: As per HPI otherwise 10 point review of systems negative.   Past Medical History:  Diagnosis Date   Anemia    Arthritis    Right shoulder   Blood transfusion without reported diagnosis    Yrs ago when she had anemia   Cataract    bilateral repair   Colon polyps    ESRD on hemodialysis ([place])    Gastritis and gastroduodenitis 12/29/2014   GERD (gastroesophageal reflux disease)    Headache(784.0)    Hx of adenomatous colonic polyps 01/04/2015   Hyperlipemia    Hypertension     Past Surgical History:  Procedure Laterality Date   AV FISTULA PLACEMENT  02/02/2012   Procedure: ARTERIOVENOUS (AV) FISTULA CREATION;  Surgeon: Angelia Mould, MD;  Location: [place];  Service: Vascular;  Laterality: Left;   AV FISTULA REPAIR     Angioplasty of fistula multiple times Left   COLONOSCOPY  2016   COLONOSCOPY WITH PROPOFOL N/A 12/29/2014   Procedure: COLONOSCOPY WITH PROPOFOL;  Surgeon: Gatha Mayer, MD;  Location: [facility];  Service: Endoscopy;  Laterality: N/A;   ESOPHAGOGASTRODUODENOSCOPY (EGD) WITH PROPOFOL N/A 12/29/2014   Procedure: ESOPHAGOGASTRODUODENOSCOPY (EGD) WITH PROPOFOL;  Surgeon: Gatha Mayer, MD;  Location: [facility];  Service: Endoscopy;  Laterality: N/A;   FISTULOGRAM N/A 04/01/2012   Procedure: FISTULOGRAM;  Surgeon: Angelia Mould, MD;  Location: [facility] C[facility];  Service: Cardiovascular;  Laterality: N/A;   INSERTION OF DIALYSIS CATHETER  02/02/2012  Procedure: INSERTION OF DIALYSIS CATHETER;  Surgeon: Angelia Mould, MD;  Location: [place];  Service: Vascular;  Laterality: Right;  Insertion of 23cm dialysis catheter in Right Internal Jugular    INSERTION OF DIALYSIS CATHETER Left 05/18/2022   Procedure: INSERTION OF LEFT FEMORAL VEIN TUNNELED DIALYSIS CATHETER;  Surgeon: Cherre Robins, MD;  Location: [place];  Service: Vascular;  Laterality: Left;   IR ANGIOGRAM SELECTIVE EACH ADDITIONAL VESSEL  09/06/2020   IR  ANGIOGRAM SELECTIVE EACH ADDITIONAL VESSEL  09/06/2020   IR ANGIOGRAM SELECTIVE EACH ADDITIONAL VESSEL  09/06/2020   IR ANGIOGRAM SELECTIVE EACH ADDITIONAL VESSEL  08/01/2022   IR ANGIOGRAM SELECTIVE EACH ADDITIONAL VESSEL  08/01/2022   IR ANGIOGRAM SELECTIVE EACH ADDITIONAL VESSEL  08/01/2022   IR ANGIOGRAM VISCERAL SELECTIVE  09/06/2020   IR ANGIOGRAM VISCERAL SELECTIVE  08/01/2022   IR EMBO ART  VEN HEMORR LYMPH EXTRAV  INC GUIDE ROADMAPPING  09/06/2020   IR EMBO ART  VEN HEMORR LYMPH EXTRAV  INC GUIDE ROADMAPPING  08/01/2022   IR US GUIDE VASC ACCESS RIGHT  09/06/2020   IR US GUIDE VASC ACCESS RIGHT  08/01/2022   REVISON OF ARTERIOVENOUS FISTULA Left 05/18/2022   Procedure: REVISON OF LEFT ARTERIOVENOUS FISTULA;  Surgeon: Cherre Robins, MD;  Location: [place];  Service: Vascular;  Laterality: Left;   [place]     reports that she has never smoked. She has never used smokeless tobacco. She reports that she does not drink alcohol and does not use drugs.  No Known Allergies  Family History  Problem Relation Age of Onset   Hypertension Mother    Cancer Sister        type unknown   Cancer Brother        type unknown   Hypertension Daughter    Colon cancer Neg Hx    Colon polyps Neg Hx    Esophageal cancer Neg Hx    Stomach cancer Neg Hx    Rectal cancer Neg Hx     Prior to Admission medications   Medication Sig Start Date End Date Taking? Authorizing Provider  acetaminophen (TYLENOL) 500 MG tablet Take 500 mg by mouth every 6 (six) hours as needed for moderate pain or headache.    [provider]  amLODipine (NORVASC) 10 MG tablet Take 10 mg by mouth every evening. 10/14/14   [provider]  atorvastatin (LIPITOR) 10 MG tablet Take 10 mg by mouth every evening.     [provider]  cinacalcet (SENSIPAR) 60 MG tablet Take 60 mg by mouth at bedtime.    [provider]  dexlansoprazole (DEXILANT) 60 MG capsule Take 60 mg by mouth daily.      [provider]  hydrALAZINE (APRESOLINE) 25 MG tablet Take 25 mg by mouth 3 (three) times daily.    [provider]  lidocaine-prilocaine (EMLA) cream Apply 1 application topically See admin instructions. Apply a small amount to access site (AVF) 1 to 2 hours before dialysis. Cover with occlusive dressing (saran warp). 08/27/18   [provider]  metoprolol tartrate (LOPRESSOR) 50 MG tablet Take 1 tablet (50 mg total) by mouth 2 (two) times daily. 09/13/18   Meccariello, Bernita Raisin, MD  multivitamin (RENA-VIT) TABS tablet Take 1 tablet by mouth daily.    [provider]  sevelamer carbonate (RENVELA) 800 MG tablet Take 3 tablets (2,400 mg total) by mouth 3 (three) times daily with meals. 09/13/18   Meccariello, Bernita Raisin, MD  Physical Exam: Vitals:   08/01/22 1120 08/01/22 1121 08/01/22 1214 08/01/22 1230  BP:  120/70  (!) 160/62  Pulse: 65 65  64  Resp: 20 (!) 25  17  Temp:   97.8 F (36.6 C)   TempSrc:   Oral   SpO2: 99% 99%  95%  Weight:      Height:        Constitutional: NAD, calm, comfortable Vitals:   08/01/22 1120 08/01/22 1121 08/01/22 1214 08/01/22 1230  BP:  120/70  (!) 160/62  Pulse: 65 65  64  Resp: 20 (!) 25  17  Temp:   97.8 F (36.6 C)   TempSrc:   Oral   SpO2: 99% 99%  95%  Weight:      Height:       Eyes: PERRL, lids and conjunctivae normal ENMT: Mucous membranes are dry. Posterior pharynx clear of any exudate or lesions.Normal dentition.  Neck: normal, supple, no masses, no thyromegaly Respiratory: clear to auscultation bilaterally, no wheezing, no crackles. Normal respiratory effort. No accessory muscle use.  Cardiovascular: Regular rate and rhythm, no murmurs / rubs / gallops. No extremity edema. 2+ pedal pulses. No carotid bruits.  Abdomen: no tenderness, no masses palpated. No hepatosplenomegaly. Bowel sounds positive.  Musculoskeletal: no clubbing / cyanosis. No joint deformity upper and lower extremities. Good ROM, no  contractures. Normal muscle tone.  Skin: no rashes, lesions, ulcers. No induration Neurologic: CN 2-12 grossly intact. Sensation intact,  Strength 5/5 in all 4.  Psychiatric: Normal judgment and insight. Alert and oriented x 3. Normal mood.    Labs on Admission: I have personally reviewed following labs and imaging studies  CBC: Recent Labs  Lab 08/01/22 0605 08/01/22 0852 08/01/22 1255  WBC 7.6  --   --   NEUTROABS 5.7  --   --   HGB 10.3* 9.7* 8.8*  HCT 32.9* 31.0* 28.3*  MCV 89.4  --   --   PLT 166  --   --    Basic Metabolic Panel: Recent Labs  Lab 08/01/22 0448 08/01/22 0852  NA 134*  --   K 5.3* 4.7  CL 94*  --   CO2 28  --   GLUCOSE 108*  --   BUN 37*  --   CREATININE 6.00*  --   CALCIUM 8.6*  --    GFR: Estimated Creatinine Clearance: 6.6 mL/min (A) (by C-G formula based on SCr of 6 mg/dL (H)). Liver Function Tests: Recent Labs  Lab 08/01/22 0448  AST 42*  ALT 18  ALKPHOS 121  BILITOT 0.9  PROT 8.3*  ALBUMIN 3.9   No results for input(s): "LIPASE", "AMYLASE" in the last 168 hours. No results for input(s): "AMMONIA" in the last 168 hours. Coagulation Profile: Recent Labs  Lab 08/01/22 0605  INR 1.0   Cardiac Enzymes: No results for input(s): "CKTOTAL", "CKMB", "CKMBINDEX", "TROPONINI" in the last 168 hours. BNP (last 3 results) No results for input(s): "PROBNP" in the last 8760 hours. HbA1C: No results for input(s): "HGBA1C" in the last 72 hours. CBG: No results for input(s): "GLUCAP" in the last 168 hours. Lipid Profile: No results for input(s): "CHOL", "HDL", "LDLCALC", "TRIG", "CHOLHDL", "LDLDIRECT" in the last 72 hours. Thyroid Function Tests: Recent Labs    08/01/22 0605  TSH 1.619   Anemia Panel: No results for input(s): "VITAMINB12", "FOLATE", "FERRITIN", "TIBC", "IRON", "RETICCTPCT" in the last 72 hours. Urine analysis:    Component Value Date/Time   COLORURINE STRAW (A) 11/07/2007 1614  APPEARANCEUR CLEAR 11/07/2007 1614    LABSPEC 1.007 11/07/2007 1614   PHURINE 5.5 11/07/2007 [place] 11/07/2007 1614   HGBUR NEGATIVE 11/07/2007 [place] 11/07/2007 1614   KETONESUR NEGATIVE 11/07/2007 1614   PROTEINUR 100 (A) 11/07/2007 1614   UROBILINOGEN 0.2 11/07/2007 1614   NITRITE NEGATIVE 11/07/2007 1614   LEUKOCYTESUR LARGE (A) 11/07/2007 1614    Radiological Exams on Admission: IR Angiogram Visceral Selective  Result Date: 08/01/2022 INDICATION: Lower GI bleed with rectal bleeding. Positive CTA with active contrast extravasation at the level of the distal transverse colon near the splenic flexure. 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 EXAM: 1. ULTRASOUND GUIDANCE FOR VASCULAR ACCESS OF THE RIGHT COMMON FEMORAL ARTERY 2. SELECTIVE ARTERIOGRAPHY OF THE SUPERIOR MESENTERIC ARTERY 3. ADDITIONAL SELECTIVE ARTERIOGRAPHY OF SECOND ORDER BRANCH OF SUPERIOR MESENTERIC ARTERY 4. ADDITIONAL SELECTIVE ARTERIOGRAPHY OF THIRD ORDER TRANSVERSE COLONIC BRANCH OF SUPERIOR MESENTERIC ARTERY 5. ADDITIONAL SELECTIVE ARTERIOGRAPHY OF FOURTH ORDER SUPPLY TO THE TRANSVERSE COLON 6. TRANSCATHETER EMBOLIZATION OF ARTERIAL SUPPLY TO THE TRANSVERSE COLON TO TREAT HEMORRHAGE MEDICATIONS: NONE ANESTHESIA/SEDATION: Moderate (conscious) sedation was employed during this procedure. A total of Versed 2.0 mg and Fentanyl 100 mcg was administered intravenously. Moderate Sedation Time: 83 minutes. The patient's level of consciousness and vital signs were monitored continuously by radiology nursing throughout the procedure under my direct supervision. CONTRAST:  50 ML OMNIPAQUE 300 FLUOROSCOPY TIME:  Radiation Exposure Index (as provided by the fluoroscopic device): 99991111 mGy Kerma COMPLICATIONS: None immediate. PROCEDURE: Informed consent was obtained from the patient and her daughter following explanation of the procedure, risks,  benefits and alternatives. The patient's daughter understands, agrees and consents for the procedure. All questions were addressed. A time out was performed prior to the initiation of the procedure. Maximal barrier sterile technique utilized including caps, mask, sterile gowns, sterile gloves, large sterile drape, hand hygiene, and chlorhexidine prep. During the procedure local anesthesia was provided with 1% lidocaine. Ultrasound was used to confirm patency of the right common femoral artery. A permanent ultrasound image was saved and recorded. Access of the right common femoral artery was performed under direct ultrasound guidance with a micropuncture set. After obtaining arterial access, a 5 [place] vascular sheath was advanced over a guidewire. A 5 French Cobra catheter was advanced into the abdominal aorta. This was used to selectively catheterize the superior mesenteric artery. Selective arteriography was performed of the SMA. The 5 French catheter was further advanced into the SMA trunk. A Progreat Alpha microcatheter was advanced through the 5 French catheter and into a second order branch supplying the transverse colon. Selective arteriography was performed in this branch via the microcatheter. The microcatheter was then further advanced into a left-sided branch of the transverse colonic artery supplying the mid to distal transverse colon. Selective arteriography was performed via the microcatheter. The microcatheter was further advanced into an additional branch of this artery and selective arteriography performed. The microcatheter was then further advanced into a further additional branch of the artery and selective arteriography performed. Attempt was made to further advance the microcatheter in this branch over a guidewire. The microcatheter was then retracted and additional arteriography performed. A slurry of Gel-Foam pledgets was then made and diluted in saline and contrast. Embolization was then  performed through the microcatheter at the level of distal transverse colonic arterial supply with diluted Gel-Foam pledgets. Additional arteriography was performed through the microcatheter.  Catheters were removed. Fluoroscopic injection of contrast was performed at the level of the arteriotomy via the 5 French sheath to assess level of femoral puncture and patency of the artery prior to closure. Arteriotomy closure was performed using a 6 [place] Angio-Seal device. FINDINGS: The superior mesenteric artery is normally patent. One of the early right-sided branches of the SMA trunk supplies the transverse colon and demonstrates an early bifurcation into proximal and distal branches. This was catheterized and selective arteriography performed. Further selective arteriography was then performed of the left sided branch of this trunk that supplies the mid to distal transverse colon. Selective arteriography further distally in this trunk demonstrates active contrast extravasation from a focal inferior branch along the mesenteric aspect of the distal transverse colon near the splenic flexure. This is in the region of contrast extravasation seen by CT angiography. Attempt to further selectively catheterize the arcade along the mesenteric aspect of the colon at this level to place embolization coils was difficult due to tortuosity and small size of the vessels. Initially, a microcatheter was able to be advanced into the proximal aspect of the arcade but this did result in dissection of the artery with occlusion and the microcatheter had to be retracted. After initial occlusion, the vessel did spontaneously open back up after a few minutes which was confirmed by arteriography. As sub-selective coil embolization was not possible, decision was made to proceed with Gel-Foam embolization of the distal transverse arterial supply in order to treat the arterial hemorrhage. This resulted in slow flow and cessation of visualization of  smaller distal branch vessels. No further active contrast extravasation was seen after embolization. IMPRESSION: 1. Active contrast extravasation in the region of contrast extravasation seen by CT angiography at the level of the distal transverse colon near the splenic flexure from distal arterial supply of the transverse colonic arcade. 2. Gel-Foam embolization was performed of distal transverse colonic arterial supply as coil embolization could not be performed due to dissection of an arterial branch supplying the region of focal bleeding along the mesenteric aspect of distal transverse colonic arterial supply. The dissection did eventually open back up but sub selective coil embolization was not possible. Electronically Signed   By: Aletta Edouard M.D.   On: 08/01/2022 12:04   IR US Guide Vasc Access Right  Result Date: 08/01/2022 INDICATION: Lower GI bleed with rectal bleeding. Positive CTA with active contrast extravasation at the level of the distal transverse colon near the splenic flexure. 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 EXAM: 1. ULTRASOUND GUIDANCE FOR VASCULAR ACCESS OF THE RIGHT COMMON FEMORAL ARTERY 2. SELECTIVE ARTERIOGRAPHY OF THE SUPERIOR MESENTERIC ARTERY 3. ADDITIONAL SELECTIVE ARTERIOGRAPHY OF SECOND ORDER BRANCH OF SUPERIOR MESENTERIC ARTERY 4. ADDITIONAL SELECTIVE ARTERIOGRAPHY OF THIRD ORDER TRANSVERSE COLONIC BRANCH OF SUPERIOR MESENTERIC ARTERY 5. ADDITIONAL SELECTIVE ARTERIOGRAPHY OF FOURTH ORDER SUPPLY TO THE TRANSVERSE COLON 6. TRANSCATHETER EMBOLIZATION OF ARTERIAL SUPPLY TO THE TRANSVERSE COLON TO TREAT HEMORRHAGE MEDICATIONS: NONE ANESTHESIA/SEDATION: Moderate (conscious) sedation was employed during this procedure. A total of Versed 2.0 mg and Fentanyl 100 mcg was administered intravenously. Moderate Sedation Time: 83 minutes. The patient's level of consciousness and vital signs  were monitored continuously by radiology nursing throughout the procedure under my direct supervision. CONTRAST:  50 ML OMNIPAQUE 300 FLUOROSCOPY TIME:  Radiation Exposure Index (as provided by the fluoroscopic device): 99991111 mGy Kerma COMPLICATIONS: None immediate. PROCEDURE: Informed consent was obtained from the  patient and her daughter following explanation of the procedure, risks, benefits and alternatives. The patient's daughter understands, agrees and consents for the procedure. All questions were addressed. A time out was performed prior to the initiation of the procedure. Maximal barrier sterile technique utilized including caps, mask, sterile gowns, sterile gloves, large sterile drape, hand hygiene, and chlorhexidine prep. During the procedure local anesthesia was provided with 1% lidocaine. Ultrasound was used to confirm patency of the right common femoral artery. A permanent ultrasound image was saved and recorded. Access of the right common femoral artery was performed under direct ultrasound guidance with a micropuncture set. After obtaining arterial access, a 5 [place] vascular sheath was advanced over a guidewire. A 5 French Cobra catheter was advanced into the abdominal aorta. This was used to selectively catheterize the superior mesenteric artery. Selective arteriography was performed of the SMA. The 5 French catheter was further advanced into the SMA trunk. A Progreat Alpha microcatheter was advanced through the 5 French catheter and into a second order branch supplying the transverse colon. Selective arteriography was performed in this branch via the microcatheter. The microcatheter was then further advanced into a left-sided branch of the transverse colonic artery supplying the mid to distal transverse colon. Selective arteriography was performed via the microcatheter. The microcatheter was further advanced into an additional branch of this artery and selective arteriography performed. The  microcatheter was then further advanced into a further additional branch of the artery and selective arteriography performed. Attempt was made to further advance the microcatheter in this branch over a guidewire. The microcatheter was then retracted and additional arteriography performed. A slurry of Gel-Foam pledgets was then made and diluted in saline and contrast. Embolization was then performed through the microcatheter at the level of distal transverse colonic arterial supply with diluted Gel-Foam pledgets. Additional arteriography was performed through the microcatheter. Catheters were removed. Fluoroscopic injection of contrast was performed at the level of the arteriotomy via the 5 French sheath to assess level of femoral puncture and patency of the artery prior to closure. Arteriotomy closure was performed using a 6 [place] Angio-Seal device. FINDINGS: The superior mesenteric artery is normally patent. One of the early right-sided branches of the SMA trunk supplies the transverse colon and demonstrates an early bifurcation into proximal and distal branches. This was catheterized and selective arteriography performed. Further selective arteriography was then performed of the left sided branch of this trunk that supplies the mid to distal transverse colon. Selective arteriography further distally in this trunk demonstrates active contrast extravasation from a focal inferior branch along the mesenteric aspect of the distal transverse colon near the splenic flexure. This is in the region of contrast extravasation seen by CT angiography. Attempt to further selectively catheterize the arcade along the mesenteric aspect of the colon at this level to place embolization coils was difficult due to tortuosity and small size of the vessels. Initially, a microcatheter was able to be advanced into the proximal aspect of the arcade but this did result in dissection of the artery with occlusion and the microcatheter had to  be retracted. After initial occlusion, the vessel did spontaneously open back up after a few minutes which was confirmed by arteriography. As sub-selective coil embolization was not possible, decision was made to proceed with Gel-Foam embolization of the distal transverse arterial supply in order to treat the arterial hemorrhage. This resulted in slow flow and cessation of visualization of smaller distal branch vessels. No further active contrast extravasation was seen after embolization.  IMPRESSION: 1. Active contrast extravasation in the region of contrast extravasation seen by CT angiography at the level of the distal transverse colon near the splenic flexure from distal arterial supply of the transverse colonic arcade. 2. Gel-Foam embolization was performed of distal transverse colonic arterial supply as coil embolization could not be performed due to dissection of an arterial branch supplying the region of focal bleeding along the mesenteric aspect of distal transverse colonic arterial supply. The dissection did eventually open back up but sub selective coil embolization was not possible. Electronically Signed   By: Aletta Edouard M.D.   On: 08/01/2022 12:04   IR Angiogram Selective Each Additional Vessel  Result Date: 08/01/2022 INDICATION: Lower GI bleed with rectal bleeding. Positive CTA with active contrast extravasation at the level of the distal transverse colon near the splenic flexure. 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 EXAM: 1. ULTRASOUND GUIDANCE FOR VASCULAR ACCESS OF THE RIGHT COMMON FEMORAL ARTERY 2. SELECTIVE ARTERIOGRAPHY OF THE SUPERIOR MESENTERIC ARTERY 3. ADDITIONAL SELECTIVE ARTERIOGRAPHY OF SECOND ORDER BRANCH OF SUPERIOR MESENTERIC ARTERY 4. ADDITIONAL SELECTIVE ARTERIOGRAPHY OF THIRD ORDER TRANSVERSE COLONIC BRANCH OF SUPERIOR MESENTERIC ARTERY 5. ADDITIONAL SELECTIVE ARTERIOGRAPHY OF  FOURTH ORDER SUPPLY TO THE TRANSVERSE COLON 6. TRANSCATHETER EMBOLIZATION OF ARTERIAL SUPPLY TO THE TRANSVERSE COLON TO TREAT HEMORRHAGE MEDICATIONS: NONE ANESTHESIA/SEDATION: Moderate (conscious) sedation was employed during this procedure. A total of Versed 2.0 mg and Fentanyl 100 mcg was administered intravenously. Moderate Sedation Time: 83 minutes. The patient's level of consciousness and vital signs were monitored continuously by radiology nursing throughout the procedure under my direct supervision. CONTRAST:  50 ML OMNIPAQUE 300 FLUOROSCOPY TIME:  Radiation Exposure Index (as provided by the fluoroscopic device): 99991111 mGy Kerma COMPLICATIONS: None immediate. PROCEDURE: Informed consent was obtained from the patient and her daughter following explanation of the procedure, risks, benefits and alternatives. The patient's daughter understands, agrees and consents for the procedure. All questions were addressed. A time out was performed prior to the initiation of the procedure. Maximal barrier sterile technique utilized including caps, mask, sterile gowns, sterile gloves, large sterile drape, hand hygiene, and chlorhexidine prep. During the procedure local anesthesia was provided with 1% lidocaine. Ultrasound was used to confirm patency of the right common femoral artery. A permanent ultrasound image was saved and recorded. Access of the right common femoral artery was performed under direct ultrasound guidance with a micropuncture set. After obtaining arterial access, a 5 [place] vascular sheath was advanced over a guidewire. A 5 French Cobra catheter was advanced into the abdominal aorta. This was used to selectively catheterize the superior mesenteric artery. Selective arteriography was performed of the SMA. The 5 French catheter was further advanced into the SMA trunk. A Progreat Alpha microcatheter was advanced through the 5 French catheter and into a second order branch supplying the transverse colon.  Selective arteriography was performed in this branch via the microcatheter. The microcatheter was then further advanced into a left-sided branch of the transverse colonic artery supplying the mid to distal transverse colon. Selective arteriography was performed via the microcatheter. The microcatheter was further advanced into an additional branch of this artery and selective arteriography performed. The microcatheter was then further advanced into a further additional branch of the artery and selective arteriography performed. Attempt was made to further advance the microcatheter in this branch over a guidewire. The microcatheter was then retracted and additional arteriography performed. A slurry of Gel-Foam pledgets  was then made and diluted in saline and contrast. Embolization was then performed through the microcatheter at the level of distal transverse colonic arterial supply with diluted Gel-Foam pledgets. Additional arteriography was performed through the microcatheter. Catheters were removed. Fluoroscopic injection of contrast was performed at the level of the arteriotomy via the 5 French sheath to assess level of femoral puncture and patency of the artery prior to closure. Arteriotomy closure was performed using a 6 [place] Angio-Seal device. FINDINGS: The superior mesenteric artery is normally patent. One of the early right-sided branches of the SMA trunk supplies the transverse colon and demonstrates an early bifurcation into proximal and distal branches. This was catheterized and selective arteriography performed. Further selective arteriography was then performed of the left sided branch of this trunk that supplies the mid to distal transverse colon. Selective arteriography further distally in this trunk demonstrates active contrast extravasation from a focal inferior branch along the mesenteric aspect of the distal transverse colon near the splenic flexure. This is in the region of contrast  extravasation seen by CT angiography. Attempt to further selectively catheterize the arcade along the mesenteric aspect of the colon at this level to place embolization coils was difficult due to tortuosity and small size of the vessels. Initially, a microcatheter was able to be advanced into the proximal aspect of the arcade but this did result in dissection of the artery with occlusion and the microcatheter had to be retracted. After initial occlusion, the vessel did spontaneously open back up after a few minutes which was confirmed by arteriography. As sub-selective coil embolization was not possible, decision was made to proceed with Gel-Foam embolization of the distal transverse arterial supply in order to treat the arterial hemorrhage. This resulted in slow flow and cessation of visualization of smaller distal branch vessels. No further active contrast extravasation was seen after embolization. IMPRESSION: 1. Active contrast extravasation in the region of contrast extravasation seen by CT angiography at the level of the distal transverse colon near the splenic flexure from distal arterial supply of the transverse colonic arcade. 2. Gel-Foam embolization was performed of distal transverse colonic arterial supply as coil embolization could not be performed due to dissection of an arterial branch supplying the region of focal bleeding along the mesenteric aspect of distal transverse colonic arterial supply. The dissection did eventually open back up but sub selective coil embolization was not possible. Electronically Signed   By: Aletta Edouard M.D.   On: 08/01/2022 12:04   IR Angiogram Selective Each Additional Vessel  Result Date: 08/01/2022 INDICATION: Lower GI bleed with rectal bleeding. Positive CTA with active contrast extravasation at the level of the distal transverse colon near the splenic flexure. 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 EXAM: 1. ULTRASOUND GUIDANCE FOR VASCULAR ACCESS OF THE RIGHT COMMON FEMORAL ARTERY 2. SELECTIVE ARTERIOGRAPHY OF THE SUPERIOR MESENTERIC ARTERY 3. ADDITIONAL SELECTIVE ARTERIOGRAPHY OF SECOND ORDER BRANCH OF SUPERIOR MESENTERIC ARTERY 4. ADDITIONAL SELECTIVE ARTERIOGRAPHY OF THIRD ORDER TRANSVERSE COLONIC BRANCH OF SUPERIOR MESENTERIC ARTERY 5. ADDITIONAL SELECTIVE ARTERIOGRAPHY OF FOURTH ORDER SUPPLY TO THE TRANSVERSE COLON 6. TRANSCATHETER EMBOLIZATION OF ARTERIAL SUPPLY TO THE TRANSVERSE COLON TO TREAT HEMORRHAGE MEDICATIONS: NONE ANESTHESIA/SEDATION: Moderate (conscious) sedation was employed during this procedure. A total of Versed 2.0 mg and Fentanyl 100 mcg was administered intravenously. Moderate Sedation Time: 83 minutes. The patient's level of consciousness and vital signs were monitored continuously by radiology nursing throughout the  procedure under my direct supervision. CONTRAST:  50 ML OMNIPAQUE 300 FLUOROSCOPY TIME:  Radiation Exposure Index (as provided by the fluoroscopic device): 99991111 mGy Kerma COMPLICATIONS: None immediate. PROCEDURE: Informed consent was obtained from the patient and her daughter following explanation of the procedure, risks, benefits and alternatives. The patient's daughter understands, agrees and consents for the procedure. All questions were addressed. A time out was performed prior to the initiation of the procedure. Maximal barrier sterile technique utilized including caps, mask, sterile gowns, sterile gloves, large sterile drape, hand hygiene, and chlorhexidine prep. During the procedure local anesthesia was provided with 1% lidocaine. Ultrasound was used to confirm patency of the right common femoral artery. A permanent ultrasound image was saved and recorded. Access of the right common femoral artery was performed under direct ultrasound guidance with a micropuncture set. After obtaining arterial access, a 5 [place] vascular sheath was  advanced over a guidewire. A 5 French Cobra catheter was advanced into the abdominal aorta. This was used to selectively catheterize the superior mesenteric artery. Selective arteriography was performed of the SMA. The 5 French catheter was further advanced into the SMA trunk. A Progreat Alpha microcatheter was advanced through the 5 French catheter and into a second order branch supplying the transverse colon. Selective arteriography was performed in this branch via the microcatheter. The microcatheter was then further advanced into a left-sided branch of the transverse colonic artery supplying the mid to distal transverse colon. Selective arteriography was performed via the microcatheter. The microcatheter was further advanced into an additional branch of this artery and selective arteriography performed. The microcatheter was then further advanced into a further additional branch of the artery and selective arteriography performed. Attempt was made to further advance the microcatheter in this branch over a guidewire. The microcatheter was then retracted and additional arteriography performed. A slurry of Gel-Foam pledgets was then made and diluted in saline and contrast. Embolization was then performed through the microcatheter at the level of distal transverse colonic arterial supply with diluted Gel-Foam pledgets. Additional arteriography was performed through the microcatheter. Catheters were removed. Fluoroscopic injection of contrast was performed at the level of the arteriotomy via the 5 French sheath to assess level of femoral puncture and patency of the artery prior to closure. Arteriotomy closure was performed using a 6 [place] Angio-Seal device. FINDINGS: The superior mesenteric artery is normally patent. One of the early right-sided branches of the SMA trunk supplies the transverse colon and demonstrates an early bifurcation into proximal and distal branches. This was catheterized and selective  arteriography performed. Further selective arteriography was then performed of the left sided branch of this trunk that supplies the mid to distal transverse colon. Selective arteriography further distally in this trunk demonstrates active contrast extravasation from a focal inferior branch along the mesenteric aspect of the distal transverse colon near the splenic flexure. This is in the region of contrast extravasation seen by CT angiography. Attempt to further selectively catheterize the arcade along the mesenteric aspect of the colon at this level to place embolization coils was difficult due to tortuosity and small size of the vessels. Initially, a microcatheter was able to be advanced into the proximal aspect of the arcade but this did result in dissection of the artery with occlusion and the microcatheter had to be retracted. After initial occlusion, the vessel did spontaneously open back up after a few minutes which was confirmed by arteriography. As sub-selective coil embolization was not possible, decision was made to proceed with Gel-Foam embolization  of the distal transverse arterial supply in order to treat the arterial hemorrhage. This resulted in slow flow and cessation of visualization of smaller distal branch vessels. No further active contrast extravasation was seen after embolization. IMPRESSION: 1. Active contrast extravasation in the region of contrast extravasation seen by CT angiography at the level of the distal transverse colon near the splenic flexure from distal arterial supply of the transverse colonic arcade. 2. Gel-Foam embolization was performed of distal transverse colonic arterial supply as coil embolization could not be performed due to dissection of an arterial branch supplying the region of focal bleeding along the mesenteric aspect of distal transverse colonic arterial supply. The dissection did eventually open back up but sub selective coil embolization was not possible.  Electronically Signed   By: Aletta Edouard M.D.   On: 08/01/2022 12:04   IR Angiogram Selective Each Additional Vessel  Result Date: 08/01/2022 INDICATION: Lower GI bleed with rectal bleeding. Positive CTA with active contrast extravasation at the level of the distal transverse colon near the splenic flexure. 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 EXAM: 1. ULTRASOUND GUIDANCE FOR VASCULAR ACCESS OF THE RIGHT COMMON FEMORAL ARTERY 2. SELECTIVE ARTERIOGRAPHY OF THE SUPERIOR MESENTERIC ARTERY 3. ADDITIONAL SELECTIVE ARTERIOGRAPHY OF SECOND ORDER BRANCH OF SUPERIOR MESENTERIC ARTERY 4. ADDITIONAL SELECTIVE ARTERIOGRAPHY OF THIRD ORDER TRANSVERSE COLONIC BRANCH OF SUPERIOR MESENTERIC ARTERY 5. ADDITIONAL SELECTIVE ARTERIOGRAPHY OF FOURTH ORDER SUPPLY TO THE TRANSVERSE COLON 6. TRANSCATHETER EMBOLIZATION OF ARTERIAL SUPPLY TO THE TRANSVERSE COLON TO TREAT HEMORRHAGE MEDICATIONS: NONE ANESTHESIA/SEDATION: Moderate (conscious) sedation was employed during this procedure. A total of Versed 2.0 mg and Fentanyl 100 mcg was administered intravenously. Moderate Sedation Time: 83 minutes. The patient's level of consciousness and vital signs were monitored continuously by radiology nursing throughout the procedure under my direct supervision. CONTRAST:  50 ML OMNIPAQUE 300 FLUOROSCOPY TIME:  Radiation Exposure Index (as provided by the fluoroscopic device): 99991111 mGy Kerma COMPLICATIONS: None immediate. PROCEDURE: Informed consent was obtained from the patient and her daughter following explanation of the procedure, risks, benefits and alternatives. The patient's daughter understands, agrees and consents for the procedure. All questions were addressed. A time out was performed prior to the initiation of the procedure. Maximal barrier sterile technique utilized including caps, mask, sterile gowns, sterile gloves, large sterile  drape, hand hygiene, and chlorhexidine prep. During the procedure local anesthesia was provided with 1% lidocaine. Ultrasound was used to confirm patency of the right common femoral artery. A permanent ultrasound image was saved and recorded. Access of the right common femoral artery was performed under direct ultrasound guidance with a micropuncture set. After obtaining arterial access, a 5 [place] vascular sheath was advanced over a guidewire. A 5 French Cobra catheter was advanced into the abdominal aorta. This was used to selectively catheterize the superior mesenteric artery. Selective arteriography was performed of the SMA. The 5 French catheter was further advanced into the SMA trunk. A Progreat Alpha microcatheter was advanced through the 5 French catheter and into a second order branch supplying the transverse colon. Selective arteriography was performed in this branch via the microcatheter. The microcatheter was then further advanced into a left-sided branch of the transverse colonic artery supplying the mid to distal transverse colon. Selective arteriography was performed via the microcatheter. The microcatheter was further advanced into an additional branch of this artery and selective arteriography performed. The microcatheter was then further advanced into a further additional  branch of the artery and selective arteriography performed. Attempt was made to further advance the microcatheter in this branch over a guidewire. The microcatheter was then retracted and additional arteriography performed. A slurry of Gel-Foam pledgets was then made and diluted in saline and contrast. Embolization was then performed through the microcatheter at the level of distal transverse colonic arterial supply with diluted Gel-Foam pledgets. Additional arteriography was performed through the microcatheter. Catheters were removed. Fluoroscopic injection of contrast was performed at the level of the arteriotomy via the 5  French sheath to assess level of femoral puncture and patency of the artery prior to closure. Arteriotomy closure was performed using a 6 [place] Angio-Seal device. FINDINGS: The superior mesenteric artery is normally patent. One of the early right-sided branches of the SMA trunk supplies the transverse colon and demonstrates an early bifurcation into proximal and distal branches. This was catheterized and selective arteriography performed. Further selective arteriography was then performed of the left sided branch of this trunk that supplies the mid to distal transverse colon. Selective arteriography further distally in this trunk demonstrates active contrast extravasation from a focal inferior branch along the mesenteric aspect of the distal transverse colon near the splenic flexure. This is in the region of contrast extravasation seen by CT angiography. Attempt to further selectively catheterize the arcade along the mesenteric aspect of the colon at this level to place embolization coils was difficult due to tortuosity and small size of the vessels. Initially, a microcatheter was able to be advanced into the proximal aspect of the arcade but this did result in dissection of the artery with occlusion and the microcatheter had to be retracted. After initial occlusion, the vessel did spontaneously open back up after a few minutes which was confirmed by arteriography. As sub-selective coil embolization was not possible, decision was made to proceed with Gel-Foam embolization of the distal transverse arterial supply in order to treat the arterial hemorrhage. This resulted in slow flow and cessation of visualization of smaller distal branch vessels. No further active contrast extravasation was seen after embolization. IMPRESSION: 1. Active contrast extravasation in the region of contrast extravasation seen by CT angiography at the level of the distal transverse colon near the splenic flexure from distal arterial supply  of the transverse colonic arcade. 2. Gel-Foam embolization was performed of distal transverse colonic arterial supply as coil embolization could not be performed due to dissection of an arterial branch supplying the region of focal bleeding along the mesenteric aspect of distal transverse colonic arterial supply. The dissection did eventually open back up but sub selective coil embolization was not possible. Electronically Signed   By: Aletta Edouard M.D.   On: 08/01/2022 12:04   IR EMBO ART  VEN HEMORR LYMPH EXTRAV  INC GUIDE ROADMAPPING  Result Date: 08/01/2022 INDICATION: Lower GI bleed with rectal bleeding. Positive CTA with active contrast extravasation at the level of the distal transverse colon near the splenic flexure. 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 EXAM: 1. ULTRASOUND GUIDANCE FOR VASCULAR ACCESS OF THE RIGHT COMMON FEMORAL ARTERY 2. SELECTIVE ARTERIOGRAPHY OF THE SUPERIOR MESENTERIC ARTERY 3. ADDITIONAL SELECTIVE ARTERIOGRAPHY OF SECOND ORDER BRANCH OF SUPERIOR MESENTERIC ARTERY 4. ADDITIONAL SELECTIVE ARTERIOGRAPHY OF THIRD ORDER TRANSVERSE COLONIC BRANCH OF SUPERIOR MESENTERIC ARTERY 5. ADDITIONAL SELECTIVE ARTERIOGRAPHY OF FOURTH ORDER SUPPLY TO THE TRANSVERSE COLON 6. TRANSCATHETER EMBOLIZATION OF ARTERIAL SUPPLY TO THE TRANSVERSE COLON TO TREAT HEMORRHAGE MEDICATIONS: NONE ANESTHESIA/SEDATION:  Moderate (conscious) sedation was employed during this procedure. A total of Versed 2.0 mg and Fentanyl 100 mcg was administered intravenously. Moderate Sedation Time: 83 minutes. The patient's level of consciousness and vital signs were monitored continuously by radiology nursing throughout the procedure under my direct supervision. CONTRAST:  50 ML OMNIPAQUE 300 FLUOROSCOPY TIME:  Radiation Exposure Index (as provided by the fluoroscopic device): 99991111 mGy Kerma COMPLICATIONS: None immediate. PROCEDURE:  Informed consent was obtained from the patient and her daughter following explanation of the procedure, risks, benefits and alternatives. The patient's daughter understands, agrees and consents for the procedure. All questions were addressed. A time out was performed prior to the initiation of the procedure. Maximal barrier sterile technique utilized including caps, mask, sterile gowns, sterile gloves, large sterile drape, hand hygiene, and chlorhexidine prep. During the procedure local anesthesia was provided with 1% lidocaine. Ultrasound was used to confirm patency of the right common femoral artery. A permanent ultrasound image was saved and recorded. Access of the right common femoral artery was performed under direct ultrasound guidance with a micropuncture set. After obtaining arterial access, a 5 [place] vascular sheath was advanced over a guidewire. A 5 French Cobra catheter was advanced into the abdominal aorta. This was used to selectively catheterize the superior mesenteric artery. Selective arteriography was performed of the SMA. The 5 French catheter was further advanced into the SMA trunk. A Progreat Alpha microcatheter was advanced through the 5 French catheter and into a second order branch supplying the transverse colon. Selective arteriography was performed in this branch via the microcatheter. The microcatheter was then further advanced into a left-sided branch of the transverse colonic artery supplying the mid to distal transverse colon. Selective arteriography was performed via the microcatheter. The microcatheter was further advanced into an additional branch of this artery and selective arteriography performed. The microcatheter was then further advanced into a further additional branch of the artery and selective arteriography performed. Attempt was made to further advance the microcatheter in this branch over a guidewire. The microcatheter was then retracted and additional arteriography  performed. A slurry of Gel-Foam pledgets was then made and diluted in saline and contrast. Embolization was then performed through the microcatheter at the level of distal transverse colonic arterial supply with diluted Gel-Foam pledgets. Additional arteriography was performed through the microcatheter. Catheters were removed. Fluoroscopic injection of contrast was performed at the level of the arteriotomy via the 5 French sheath to assess level of femoral puncture and patency of the artery prior to closure. Arteriotomy closure was performed using a 6 [place] Angio-Seal device. FINDINGS: The superior mesenteric artery is normally patent. One of the early right-sided branches of the SMA trunk supplies the transverse colon and demonstrates an early bifurcation into proximal and distal branches. This was catheterized and selective arteriography performed. Further selective arteriography was then performed of the left sided branch of this trunk that supplies the mid to distal transverse colon. Selective arteriography further distally in this trunk demonstrates active contrast extravasation from a focal inferior branch along the mesenteric aspect of the distal transverse colon near the splenic flexure. This is in the region of contrast extravasation seen by CT angiography. Attempt to further selectively catheterize the arcade along the mesenteric aspect of the colon at this level to place embolization coils was difficult due to tortuosity and small size of the vessels. Initially, a microcatheter was able to be advanced into the proximal aspect of the arcade but this did result in dissection of the artery with  occlusion and the microcatheter had to be retracted. After initial occlusion, the vessel did spontaneously open back up after a few minutes which was confirmed by arteriography. As sub-selective coil embolization was not possible, decision was made to proceed with Gel-Foam embolization of the distal transverse  arterial supply in order to treat the arterial hemorrhage. This resulted in slow flow and cessation of visualization of smaller distal branch vessels. No further active contrast extravasation was seen after embolization. IMPRESSION: 1. Active contrast extravasation in the region of contrast extravasation seen by CT angiography at the level of the distal transverse colon near the splenic flexure from distal arterial supply of the transverse colonic arcade. 2. Gel-Foam embolization was performed of distal transverse colonic arterial supply as coil embolization could not be performed due to dissection of an arterial branch supplying the region of focal bleeding along the mesenteric aspect of distal transverse colonic arterial supply. The dissection did eventually open back up but sub selective coil embolization was not possible. Electronically Signed   By: Aletta Edouard M.D.   On: 08/01/2022 12:04   CT ANGIO GI BLEED  Result Date: 08/01/2022 CLINICAL DATA:  Mid abdominal pain. Bright red rectal bleeding. End-stage renal disease. EXAM: CTA ABDOMEN AND PELVIS WITHOUT AND WITH CONTRAST TECHNIQUE: Multidetector CT imaging of the abdomen and pelvis was performed using the standard protocol during bolus administration of intravenous contrast. Multiplanar reconstructed images and MIPs were obtained and reviewed to evaluate the vascular anatomy. RADIATION DOSE REDUCTION: This exam was performed according to the departmental dose-optimization program which includes automated exposure control, adjustment of the mA and/or kV according to patient size and/or use of iterative reconstruction technique. CONTRAST:  15m OMNIPAQUE IOHEXOL 350 MG/ML SOLN COMPARISON:  09/07/2020 FINDINGS: VASCULAR Aorta: Atheromatous wall thickening and calcification of the aorta. No aneurysm or dissection Celiac: Atheromatous plaque at the origin accentuated by median arcuate ligament based on morphology, but without critical stenosis. SMA:  Moderate origin stenosis due to calcified plaque with mild poststenotic dilatation. No acute finding. Renals: Symmetrically enhancing renal arteries without high-grade proximal stenosis. IMA: Patent Inflow: Extensive atheromatous calcification. Proximal Outflow: Atheromatous plaque with up to 50% narrowing at the right common iliac artery. Veins: Unremarkable Review of the MIP images confirms the above findings. NON-VASCULAR Lower chest: Cardiomegaly and atherosclerosis. Hazy appearance of markings at the lung bases, favor scarring. Hepatobiliary: Some surface lobulation and caudate lobe enlargement suggested, cirrhosis is conceivable but not definite.Cholelithiasis. No acute inflammation or biliary obstruction seen. Pancreas: Unremarkable. Spleen: Unremarkable. Adrenals/Urinary Tract: Negative adrenals. No hydronephrosis or stone. Severe bilateral renal atrophy in keeping with end-stage renal disease history. Simple bilateral renal cysts measuring up to 11 mm on the right, no follow-up imaging recommended. Unremarkable bladder. Stomach/Bowel: Diverticular colon. Actively intraluminal hemorrhage is seen at a distal transverse diverticulum along the inferior wall, site marked on 6:95. No bowel wall thickening or obstruction. No appendicitis. Lymphatic: Negative for mass or adenopathy Reproductive:Unremarkable for age Other: No ascites or pneumoperitoneum. Musculoskeletal: Extensive spinal degeneration. ER attending made aware via epic chat. IMPRESSION: 1. 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 2. Atherosclerosis with ~ 50% narrowing at the SMA and right common iliac arteries. 3. Additional chronic findings are described above. Electronically Signed   By: JJorje GuildM.D.   On: 08/01/2022 07:28    EKG: Independently reviewed.   Assessment/Plan Acute diverticular bleed - s/p foam embolization -no further bleeding since, with h/h current now stable at 8.8 down from 10.8  -monitor h/h  q6h  -  transfuse for hgb <8 due to active bleed - Gi dr Loletha Carrow following , per gi call IR if patient has rebleed  -gi protection , iv protonix daily   ESRD  - on HD MWF -will need to consult renal once patient arrives on [facility]   Anemia of blood loss - check iron stores   GERD -ppi   Hypertension  -stable  -hold anti-htn medications currently - can resume once h/h stable   HLD -resume statin once patient tolerating po       DVT prophylaxis: scd Code Status: full/ as discussed per patient wishes in event of cardiac arrest  Family Communication: none at bedside Disposition Plan: patient  expected to be admitted greater than 2 midnights  Consults called:  DR Erick Alley Admission status: progressive care   Clance Boll MD [facility]   If 7PM-7AM, please contact night-coverage [email or web address] Password [facility]  08/01/2022, 3:35 PM

## 2022-08-01 NOTE — ED Triage Notes (Signed)
Patient reports bright red rectal bleeding starting at 0230 this morning. Patient reports mild abdominal pain. Denies nausea, vomiting, and fever. Reports this has happened in the past and they had to "stitch her up on the inside."  Patient is A&Ox4 and in NAD at time of triage.

## 2022-08-01 NOTE — Telephone Encounter (Signed)
error 

## 2022-08-01 NOTE — Consult Note (Addendum)
Referring Provider: [name] PA-C Primary Care Physician:  Bartholome Bill, MD Primary Gastroenterologist:  Dr. Silvano Rusk  Reason for Consultation:  Hematochezia   HPI: Alexa Dougherty is a 77 y.o. female with a past medical history of arthritis, hypertension, hyperlipidemia, ESRD on hemodialysis every MWF, GERD, lower GI bleed 08/2020 and colon polyps.  She presented to the ED early this morning with acute onset of hematochezia x 3 at home and x 2 episodes since arriving to the ED. Labs in the ED showed a hemoglobin level of 10.3 (Hg 12.2 on 05/18/2022).  Hematocrit 32.9.  Platelet 166.  Sodium 134.  Potassium 5.3.  BUN 37 (BUN 43 on 05/18/2022). Creatinine 6.0.  Calcium 8.6.  Total bili 0.9.  Alk phos 121.  AST 42.  ALT 18.  INR 1.0. CTA identified active diverticular hemorrhage at the distal transverse colon and atherosclerosis with 50% narrowing of the SMA and right common iliac arteries. A GI consult was requested for further evaluation regarding hematochezia and IR consult was requested for angiogram with coil embolization.   Patient speaks English fairly well. I attempted to contact a [place] Presenter, broadcasting but an interpretor was not available at this time. I called the patient's daughter, Alexa Dougherty, who speaks [place] fluently and she confirmed the history provided by the patient.   She felt well yesterday following her dialysis session. No recent illnesses. She awakened at 2:30 am with central upper abdominal pain, not severe, then passed 3 red bloody bowel movements. She passed 2 similar red bloody bowel movements earlier this morning in the ED and she passed a 3rd dark red blood bowel movement with dark clots while I was at the bedside at this time. She denies having any abdominal pain at this time.  No nausea or vomiting.  No chest pain, shortness of breath or dizziness.  She denies taking aspirin or NSAIDs.  No alcohol use.  Non-smoker.    She has a history of a lower GI  bleed 08/2020. At that time, an abdominal/pelvic CTA 09/06/2020 identified active bleeding near the hepatic flexure for which she underwent superior mesenteric angiogram/right colic arteriogram with distal right colic gelfoam and coil embolization.  IR. Hg 10.5 -> 6.4 ->  transfused 2 units of PRBC, Hg  8.9 at time of discharge.   PAST GI PROCEDURES:  Colonoscopy 04/01/2020: - Eight 2 to 6 mm polyps in the sigmoid colon, in the descending colon and in the ascending colon, removed with a cold snare. Resected and retrieved - Diverticulosis in the entire examined colon.  - The examination was otherwise normal on direct and retroflexion views.  - Personal history of colonic polyps.  Impression: - The examination was otherwise normal on direct and retroflexion views.  - Recall colonoscopy 3 years - TUBULAR ADENOMA (X MULTIPLE). - NEGATIVE FOR HIGH GRADE DYSPLASIA  Capsule endoscopy 05/11/2015: Gastritis in antrum and is on EGD No other abnormality seen  Colonoscopy 12/29/2014: 1) 8 polyps removed 2-7 mm size. cecum, transverse (3) descending (2) and sigmoid all removed cold snare and sent to [facility] 2 patholgy. ascending polyp removed cold biopsy.  2) Pan-diverticulosis  3) Internal hemorrhoids  4) Otherwise normal colon and rectum - good prep   Upper endoscopy 12/29/2014: Antral gastritis - streaks of submocosal heme and erythema, no erosions. otherwise normal. Gastritis biopsied  1. Stomach, biopsy, antral bx, gastritis r/o h pylori - MILD CHRONIC INACTIVE GASTRITIS (ANTRAL MUCOSA). - WARTHIN STARRY STAIN NEGATIVE FOR H. PYLORI. 2. Colon,  polyp(s), 3 transverse, 2 descending - TUBULAR ADENOMAS (X4); NEGATIVE FOR HIGH GRADE DYSPLASIA OR MALIGNANCY. - HYPERPLASTIC POLYP (X1). 3. Colon, polyp(s), cecal and ascending - TUBULAR ADENOMA (X1); NEGATIVE FOR HIGH GRADE DYSPLASIA OR MALIGNANCY. - FRAGMENTS OF BENIGN POLYPOID COLONIC MUCOSA. 4. Colon, polyp(s), sigmoid - TUBULAR ADENOMA (X1);  NEGATIVE FOR HIGH GRADE DYSPLASIA OR MALIGNANCY   Past Medical History:  Diagnosis Date   Anemia    Arthritis    Right shoulder   Blood transfusion without reported diagnosis    Yrs ago when she had anemia   Cataract    bilateral repair   Colon polyps    ESRD on hemodialysis ([place])    Gastritis and gastroduodenitis 12/29/2014   GERD (gastroesophageal reflux disease)    Headache(784.0)    Hx of adenomatous colonic polyps 01/04/2015   Hyperlipemia    Hypertension     Past Surgical History:  Procedure Laterality Date   AV FISTULA PLACEMENT  02/02/2012   Procedure: ARTERIOVENOUS (AV) FISTULA CREATION;  Surgeon: Angelia Mould, MD;  Location: [facility] OR;  Service: Vascular;  Laterality: Left;   AV FISTULA REPAIR     Angioplasty of fistula multiple times Left   COLONOSCOPY  2016   COLONOSCOPY WITH PROPOFOL N/A 12/29/2014   Procedure: COLONOSCOPY WITH PROPOFOL;  Surgeon: Gatha Mayer, MD;  Location: [facility] ENDOSCOPY;  Service: Endoscopy;  Laterality: N/A;   ESOPHAGOGASTRODUODENOSCOPY (EGD) WITH PROPOFOL N/A 12/29/2014   Procedure: ESOPHAGOGASTRODUODENOSCOPY (EGD) WITH PROPOFOL;  Surgeon: Gatha Mayer, MD;  Location: [facility] ENDOSCOPY;  Service: Endoscopy;  Laterality: N/A;   FISTULOGRAM N/A 04/01/2012   Procedure: FISTULOGRAM;  Surgeon: Angelia Mould, MD;  Location: [facility] CATH LAB;  Service: Cardiovascular;  Laterality: N/A;   INSERTION OF DIALYSIS CATHETER  02/02/2012   Procedure: INSERTION OF DIALYSIS CATHETER;  Surgeon: Angelia Mould, MD;  Location: [place];  Service: Vascular;  Laterality: Right;  Insertion of 23cm dialysis catheter in Right Internal Jugular    INSERTION OF DIALYSIS CATHETER Left 05/18/2022   Procedure: INSERTION OF LEFT FEMORAL VEIN TUNNELED DIALYSIS CATHETER;  Surgeon: Cherre Robins, MD;  Location: [place];  Service: Vascular;  Laterality: Left;   IR ANGIOGRAM SELECTIVE EACH ADDITIONAL VESSEL  09/06/2020   IR ANGIOGRAM SELECTIVE EACH ADDITIONAL VESSEL   09/06/2020   IR ANGIOGRAM SELECTIVE EACH ADDITIONAL VESSEL  09/06/2020   IR ANGIOGRAM VISCERAL SELECTIVE  09/06/2020   IR EMBO ART  VEN HEMORR LYMPH EXTRAV  INC GUIDE ROADMAPPING  09/06/2020   IR US GUIDE VASC ACCESS RIGHT  09/06/2020   REVISON OF ARTERIOVENOUS FISTULA Left 05/18/2022   Procedure: REVISON OF LEFT ARTERIOVENOUS FISTULA;  Surgeon: Cherre Robins, MD;  Location: [place];  Service: Vascular;  Laterality: Left;   [place]    Prior to Admission medications   Medication Sig Start Date End Date Taking? Authorizing Provider  acetaminophen (TYLENOL) 500 MG tablet Take 500 mg by mouth every 6 (six) hours as needed for moderate pain or headache.    [provider]  amLODipine (NORVASC) 10 MG tablet Take 10 mg by mouth every evening. 10/14/14   [provider]  atorvastatin (LIPITOR) 10 MG tablet Take 10 mg by mouth every evening.     [provider]  cinacalcet (SENSIPAR) 60 MG tablet Take 60 mg by mouth at bedtime.    [provider]  dexlansoprazole (DEXILANT) 60 MG capsule Take 60 mg by mouth daily.     [provider]  hydrALAZINE (  APRESOLINE) 25 MG tablet Take 25 mg by mouth 3 (three) times daily.    [provider]  lidocaine-prilocaine (EMLA) cream Apply 1 application topically See admin instructions. Apply a small amount to access site (AVF) 1 to 2 hours before dialysis. Cover with occlusive dressing (saran warp). 08/27/18   [provider]  metoprolol tartrate (LOPRESSOR) 50 MG tablet Take 1 tablet (50 mg total) by mouth 2 (two) times daily. 09/13/18   Meccariello, Bernita Raisin, MD  multivitamin (RENA-VIT) TABS tablet Take 1 tablet by mouth daily.    [provider]  sevelamer carbonate (RENVELA) 800 MG tablet Take 3 tablets (2,400 mg total) by mouth 3 (three) times daily with meals. 09/13/18   Meccariello, Bernita Raisin, MD    No current facility-administered medications for this encounter.   Current Outpatient  Medications  Medication Sig Dispense Refill   acetaminophen (TYLENOL) 500 MG tablet Take 500 mg by mouth every 6 (six) hours as needed for moderate pain or headache.     amLODipine (NORVASC) 10 MG tablet Take 10 mg by mouth every evening.     atorvastatin (LIPITOR) 10 MG tablet Take 10 mg by mouth every evening.      cinacalcet (SENSIPAR) 60 MG tablet Take 60 mg by mouth at bedtime.     dexlansoprazole (DEXILANT) 60 MG capsule Take 60 mg by mouth daily.      hydrALAZINE (APRESOLINE) 25 MG tablet Take 25 mg by mouth 3 (three) times daily.     lidocaine-prilocaine (EMLA) cream Apply 1 application topically See admin instructions. Apply a small amount to access site (AVF) 1 to 2 hours before dialysis. Cover with occlusive dressing (saran warp).     metoprolol tartrate (LOPRESSOR) 50 MG tablet Take 1 tablet (50 mg total) by mouth 2 (two) times daily. 60 tablet 0   multivitamin (RENA-VIT) TABS tablet Take 1 tablet by mouth daily.     sevelamer carbonate (RENVELA) 800 MG tablet Take 3 tablets (2,400 mg total) by mouth 3 (three) times daily with meals. 90 tablet 0    Allergies as of 08/01/2022   (No Known Allergies)    Family History  Problem Relation Age of Onset   Hypertension Mother    Cancer Sister        type unknown   Cancer Brother        type unknown   Hypertension Daughter    Colon cancer Neg Hx    Colon polyps Neg Hx    Esophageal cancer Neg Hx    Stomach cancer Neg Hx    Rectal cancer Neg Hx     Social History   Socioeconomic History   Marital status: Widowed    Spouse name: Not on file   Number of children: 7   Years of education: Not on file   Highest education level: Not on file  Occupational History   Occupation: retired  Tobacco Use   Smoking status: Never   Smokeless tobacco: Never  Vaping Use   Vaping Use: Never used  Substance and Sexual Activity   Alcohol use: No   Drug use: No   Sexual activity: Not on file  Other Topics Concern   Not on file   Social History Narrative   Not on file   Social Determinants of Health   Financial Resource Strain: Not on file  Food Insecurity: Not on file  Transportation Needs: Not on file  Physical Activity: Not on file  Stress: Not on file  Social Connections:  Not on file  Intimate Partner Violence: Not on file    Review of Systems: Gen: Denies fever, sweats or chills. No weight loss.  CV: Denies chest pain, palpitations or edema. Resp: Denies cough, shortness of breath of hemoptysis.  GI: See HPI. No GERD symptoms.  GU : Denies urinary burning, blood in urine, increased urinary frequency or incontinence. MS: Denies joint pain, muscles aches or weakness. Derm: Denies rash, itchiness, skin lesions or unhealing ulcers. Psych: Denies depression, anxiety, memory loss or confusion. Heme: Denies easy bruising, bleeding. Neuro:  Denies headaches, dizziness or paresthesias. Endo:  Denies any problems with DM, thyroid or adrenal function.   Physical Exam: Vital signs in last 24 hours: Temp:  [97.7 F (36.5 C)-98.5 F (36.9 C)] 97.7 F (36.5 C) (03/12 0727) Pulse Rate:  [65-68] 65 (03/12 0516) Resp:  [15-16] 15 (03/12 0516) BP: (167-182)/(67-68) 167/67 (03/12 0516) SpO2:  [97 %-98 %] 97 % (03/12 0516) Weight:  [68 kg] 68 kg (03/12 0437)   General: Alert 77 year old female in no acute distress. Head:  Normocephalic and atraumatic. Eyes:  No scleral icterus. Conjunctiva pink. Ears:  Normal auditory acuity. Nose:  No deformity, discharge or lesions. Mouth: Poor dentition.  No ulcers or lesions.  Neck:  Supple. No lymphadenopathy or thyromegaly.  Lungs: Breath sounds clear throughout. No wheezes, rhonchi or crackles.  Heart: Rate and rhythm, no murmurs. Abdomen: Soft, nondistended.  Positive bowel sounds to all 4 quadrants. Rectal: Deferred. Musculoskeletal:  Symmetrical without gross deformities.  Pulses:  Normal pulses noted. Extremities:  Without clubbing or edema. LUE AV fistula  with + bruit and thrill.  Neurologic:  Alert and  oriented x 4. No focal deficits.  Skin:  Intact without significant lesions or rashes. Psych:  Alert and cooperative. Normal mood and affect.  Intake/Output from previous day: No intake/output data recorded. Intake/Output this shift: No intake/output data recorded.  Lab Results: Recent Labs    08/01/22 0605  WBC 7.6  HGB 10.3*  HCT 32.9*  PLT 166   BMET Recent Labs    08/01/22 0448  NA 134*  K 5.3*  CL 94*  CO2 28  GLUCOSE 108*  BUN 37*  CREATININE 6.00*  CALCIUM 8.6*   LFT Recent Labs    08/01/22 0448  PROT 8.3*  ALBUMIN 3.9  AST 42*  ALT 18  ALKPHOS 121  BILITOT 0.9   PT/INR Recent Labs    08/01/22 0605  LABPROT 12.9  INR 1.0   Hepatitis Panel No results for input(s): "HEPBSAG", "HCVAB", "HEPAIGM", "HEPBIGM" in the last 72 hours.    Studies/Results: CT ANGIO GI BLEED  Result Date: 08/01/2022 CLINICAL DATA:  Mid abdominal pain. Bright red rectal bleeding. End-stage renal disease. EXAM: CTA ABDOMEN AND PELVIS WITHOUT AND WITH CONTRAST TECHNIQUE: Multidetector CT imaging of the abdomen and pelvis was performed using the standard protocol during bolus administration of intravenous contrast. Multiplanar reconstructed images and MIPs were obtained and reviewed to evaluate the vascular anatomy. RADIATION DOSE REDUCTION: This exam was performed according to the departmental dose-optimization program which includes automated exposure control, adjustment of the mA and/or kV according to patient size and/or use of iterative reconstruction technique. CONTRAST:  61m OMNIPAQUE IOHEXOL 350 MG/ML SOLN COMPARISON:  09/07/2020 FINDINGS: VASCULAR Aorta: Atheromatous wall thickening and calcification of the aorta. No aneurysm or dissection Celiac: Atheromatous plaque at the origin accentuated by median arcuate ligament based on morphology, but without critical stenosis. SMA: Moderate origin stenosis due to calcified plaque  with mild poststenotic  dilatation. No acute finding. Renals: Symmetrically enhancing renal arteries without high-grade proximal stenosis. IMA: Patent Inflow: Extensive atheromatous calcification. Proximal Outflow: Atheromatous plaque with up to 50% narrowing at the right common iliac artery. Veins: Unremarkable Review of the MIP images confirms the above findings. NON-VASCULAR Lower chest: Cardiomegaly and atherosclerosis. Hazy appearance of markings at the lung bases, favor scarring. Hepatobiliary: Some surface lobulation and caudate lobe enlargement suggested, cirrhosis is conceivable but not definite.Cholelithiasis. No acute inflammation or biliary obstruction seen. Pancreas: Unremarkable. Spleen: Unremarkable. Adrenals/Urinary Tract: Negative adrenals. No hydronephrosis or stone. Severe bilateral renal atrophy in keeping with end-stage renal disease history. Simple bilateral renal cysts measuring up to 11 mm on the right, no follow-up imaging recommended. Unremarkable bladder. Stomach/Bowel: Diverticular colon. Actively intraluminal hemorrhage is seen at a distal transverse diverticulum along the inferior wall, site marked on 6:95. No bowel wall thickening or obstruction. No appendicitis. Lymphatic: Negative for mass or adenopathy Reproductive:Unremarkable for age Other: No ascites or pneumoperitoneum. Musculoskeletal: Extensive spinal degeneration. ER attending made aware via epic chat. IMPRESSION: 1. 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 2. Atherosclerosis with ~ 50% narrowing at the SMA and right common iliac arteries. 3. Additional chronic findings are described above. Electronically Signed   By: Jorje Guild M.D.   On: 08/01/2022 07:28    IMPRESSION/PLAN:  77 year old female with a history of a diverticular bleed s/p coil embolization of distal right colic artery [id number] who presented to the ED this morning with painless hematochezia x 3 episodes at home and x 3 since arriving  to the ED. Hemoglobin 10.3. CTA identified active diverticular hemorrhage at the distal transverse colon and atherosclerosis with 50% narrowing of the SMA and right common iliac arteries. IR consult requested for angiogram with coil embolization. Hemodynamically stable. -NPO -H/H now and Q 6 hrs x 24 hours -Transfuse for hemoglobin less than 8 in setting of active GI bleed -Await IR consult, angiogram with coil embolization -No plans for endoscopic evaluation at this time, await further recommendations per Dr .Loletha Carrow  Acute on chronic anemia, secondary to acute GI bleed and ESRD  ESRD on hemodialysis every MWF, last dialysis session was 07/31/2022  GERD -PPI IV QD    Noralyn Pick  08/01/2022, 8:52AM  I have taken an interval history, thoroughly reviewed the chart and examined the patient. I agree with the Advanced Practitioner's note, impression and recommendations, and have recorded additional findings, impressions and recommendations below. I performed a substantive portion of this encounter (>50% time spent), including a complete performance of the medical decision making.  My additional thoughts are as follows:  76 year old woman with multiple medical issues as noted above including dialysis now admitted for her second transverse colon diverticular bleed in 2 years.  Prior episode near the hepatic flexure required coil embolization by IR.  Since she was initially seen by our APP earlier today, this patient has undergone abdominal angiogram with identification of active bleeding in the region of the distal transverse colon/splenic flexure which was treated with Gelfoam injection.  No bleeding was seen at the end of that exam.  Hemoglobin dropped from 10.3 upon admission to 8.8.  Her intervention today is likely to have saw this bleeding.  She needs serial hemoglobin and hematocrits, GI service will follow along until tomorrow.  If she has recurrence of brisk GI bleeding,  please contact the interventional radiology service.   Nelida Meuse III Office:[phone]

## 2022-08-01 NOTE — Plan of Care (Signed)
Outpatient HD orders:  [facility]  3.75hrs MWF 400/500 edw 65.7kg 2K 2Ca LU AVF No heparin Venofer '100mg'$  qHD x10 No current ESA - last mircera 14mg on 06/19/22 Hectorol 336m qHD  Sensipar '60mg'$  qd  LiJen MowPA-C [employer]

## 2022-08-01 NOTE — ED Provider Notes (Signed)
Care assumed from night shift PA-C, [name], pending CT and disposition. This is a 77 year old female with PMH ESRD on HD MWF who presents to ED c/o bright red blood diarrhea that started early this morning.  Patient had 3 episodes at home and to hear that were grossly bloody.  She is not on any anticoagulants at home.  She does have a history of a diverticular bleed last year that required interventional radiology intervention multiple units of blood transfusion.  On assumption of care, patient is hemodynamically stable with a hemoglobin of 10.3 which is similar to her baseline.  Her abdomen is soft and nontender.  She was given Protonix by night team.  GI has been consulted and is aware of patient.  Dr. Candis Schatz with GI recommended that if CT was positive to engage interventional radiology.    CT shows acute diverticular bleed. Consult was placed to interventional radiology as well as number under consult order contacted twice around 7:30 AM.  Person who answered the line on the second attempt stated that interventional radiology is unavailable until 8 AM but they did collect patient's MRN and information and will have interventional radiologist review it as soon as they are in the office and call me back with their recommendations.    Nephrology, Dr. Royce Macadamia, also consulted with patient's history of hemodialysis.  Aware of patient, will recollect potassium due to hemolyzing.  They recommend admission over at [facility] pending IR recommendations as patient will need to receive hemodialysis over there.   Interventional radiology returned call around 8am and spoke with Dr. Kathlene Cote, who recommends continue to monitor vital signs and they will come and see pt and determine if intervention at [facility] is appropriate. He believes she will be able to better be accommodated at [facility] vs immediate transfer to [facility] as procedure will be more delayed at [facility]. Repeat hemoglobin ordered to  trend. Nurse aware to closely monitor vital signs and notify of significant abnormalities.  Patient taken to IR for intervention. Following this, she should be admitted to [facility] hospitalist team so she can receive hemodialysis during hospitalization.   IR able to embolize area of bleed using Gelfoam but unable to get far enough for coils. Pt to be brought back to ED and will consult for admission to hospital. IR reports bleeding well controlled and pt stable. Will recheck hemoglobin on return to ED.   Spoke with hospitalist team, Dr. Marcello Moores, aware that patient needs to be transferred over to [facility] at nephrology request to obtain hemodialysis.  Will arrange this transfer and admission.  At time of disposition, patient stable with normal heart rate and no hypotension.  Impression Lower GI bleed Hematochezia ESRD (end stage renal disease) on dialysis [facility])    Suzzette Righter, PA-C 08/01/22 1249    [name], MD 08/01/22 1523

## 2022-08-01 NOTE — Consult Note (Signed)
Chief Complaint: Patient was seen in consultation today for  Chief Complaint  Patient presents with   Rectal Bleeding    Referring Physician(s): Dr. Christy Gentles   Supervising Physician: Aletta Edouard  Patient Status: [facility] - ED  History of Present Illness: Alexa Dougherty is a 77 y.o. female with a medical history significant for HTN, ESRD on hemodialysis, diverticulitis and GI bleed. She is familiar to IR from a prior embolization 09/06/20 to treat a hemorrhage from the ascending colon near the hepatic flexure. She presented to the [facility] ED 08/01/22 with complaints of acute bloody diarrhea. Imaging was positive for a GI bleed. Labs were positive for worsening anemia.    CT Angio GI Bleed 11/15/1944 IMPRESSION: 1. 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 2. Atherosclerosis with ~ 50% narrowing at the SMA and right common iliac arteries. 3. Additional chronic findings are described above.  Interventional Radiology has been asked to evaluate this patient for an image-guided mesenteric angiogram with possible intervention, possible embolization. Imaging reviewed and procedure approved by Dr. Kathlene Cote.   Past Medical History:  Diagnosis Date   Anemia    Arthritis    Right shoulder   Blood transfusion without reported diagnosis    Yrs ago when she had anemia   Cataract    bilateral repair   Colon polyps    ESRD on hemodialysis ([place])    Gastritis and gastroduodenitis 12/29/2014   GERD (gastroesophageal reflux disease)    Headache(784.0)    Hx of adenomatous colonic polyps 01/04/2015   Hyperlipemia    Hypertension     Past Surgical History:  Procedure Laterality Date   AV FISTULA PLACEMENT  02/02/2012   Procedure: ARTERIOVENOUS (AV) FISTULA CREATION;  Surgeon: Angelia Mould, MD;  Location: [place];  Service: Vascular;  Laterality: Left;   AV FISTULA REPAIR     Angioplasty of fistula multiple times Left   COLONOSCOPY  2016   COLONOSCOPY WITH  PROPOFOL N/A 12/29/2014   Procedure: COLONOSCOPY WITH PROPOFOL;  Surgeon: Gatha Mayer, MD;  Location: [facility];  Service: Endoscopy;  Laterality: N/A;   ESOPHAGOGASTRODUODENOSCOPY (EGD) WITH PROPOFOL N/A 12/29/2014   Procedure: ESOPHAGOGASTRODUODENOSCOPY (EGD) WITH PROPOFOL;  Surgeon: Gatha Mayer, MD;  Location: [facility];  Service: Endoscopy;  Laterality: N/A;   FISTULOGRAM N/A 04/01/2012   Procedure: FISTULOGRAM;  Surgeon: Angelia Mould, MD;  Location: [facility] CATH LAB;  Service: Cardiovascular;  Laterality: N/A;   INSERTION OF DIALYSIS CATHETER  02/02/2012   Procedure: INSERTION OF DIALYSIS CATHETER;  Surgeon: Angelia Mould, MD;  Location: [place];  Service: Vascular;  Laterality: Right;  Insertion of 23cm dialysis catheter in Right Internal Jugular    INSERTION OF DIALYSIS CATHETER Left 05/18/2022   Procedure: INSERTION OF LEFT FEMORAL VEIN TUNNELED DIALYSIS CATHETER;  Surgeon: Cherre Robins, MD;  Location: [place];  Service: Vascular;  Laterality: Left;   IR ANGIOGRAM SELECTIVE EACH ADDITIONAL VESSEL  09/06/2020   IR ANGIOGRAM SELECTIVE EACH ADDITIONAL VESSEL  09/06/2020   IR ANGIOGRAM SELECTIVE EACH ADDITIONAL VESSEL  09/06/2020   IR ANGIOGRAM VISCERAL SELECTIVE  09/06/2020   IR EMBO ART  VEN HEMORR LYMPH EXTRAV  INC GUIDE ROADMAPPING  09/06/2020   IR US GUIDE VASC ACCESS RIGHT  09/06/2020   REVISON OF ARTERIOVENOUS FISTULA Left 05/18/2022   Procedure: REVISON OF LEFT ARTERIOVENOUS FISTULA;  Surgeon: Cherre Robins, MD;  Location: [place];  Service: Vascular;  Laterality: Left;   [place]  Allergies: Patient has no known allergies.  Medications: Prior to Admission medications   Medication Sig Start Date End Date Taking? Authorizing Provider  acetaminophen (TYLENOL) 500 MG tablet Take 500 mg by mouth every 6 (six) hours as needed for moderate pain or headache.    [provider]  amLODipine (NORVASC) 10 MG tablet Take 10 mg by mouth every evening.  10/14/14   [provider]  atorvastatin (LIPITOR) 10 MG tablet Take 10 mg by mouth every evening.     [provider]  cinacalcet (SENSIPAR) 60 MG tablet Take 60 mg by mouth at bedtime.    [provider]  dexlansoprazole (DEXILANT) 60 MG capsule Take 60 mg by mouth daily.     [provider]  hydrALAZINE (APRESOLINE) 25 MG tablet Take 25 mg by mouth 3 (three) times daily.    [provider]  lidocaine-prilocaine (EMLA) cream Apply 1 application topically See admin instructions. Apply a small amount to access site (AVF) 1 to 2 hours before dialysis. Cover with occlusive dressing (saran warp). 08/27/18   [provider]  metoprolol tartrate (LOPRESSOR) 50 MG tablet Take 1 tablet (50 mg total) by mouth 2 (two) times daily. 09/13/18   Meccariello, Bernita Raisin, MD  multivitamin (RENA-VIT) TABS tablet Take 1 tablet by mouth daily.    [provider]  sevelamer carbonate (RENVELA) 800 MG tablet Take 3 tablets (2,400 mg total) by mouth 3 (three) times daily with meals. 09/13/18   Meccariello, Bernita Raisin, MD     Family History  Problem Relation Age of Onset   Hypertension Mother    Cancer Sister        type unknown   Cancer Brother        type unknown   Hypertension Daughter    Colon cancer Neg Hx    Colon polyps Neg Hx    Esophageal cancer Neg Hx    Stomach cancer Neg Hx    Rectal cancer Neg Hx     Social History   Socioeconomic History   Marital status: Widowed    Spouse name: Not on file   Number of children: 7   Years of education: Not on file   Highest education level: Not on file  Occupational History   Occupation: retired  Tobacco Use   Smoking status: Never   Smokeless tobacco: Never  Vaping Use   Vaping Use: Never used  Substance and Sexual Activity   Alcohol use: No   Drug use: No   Sexual activity: Not on file  Other Topics Concern   Not on file  Social History Narrative   Not on file   Social Determinants of  Health   Financial Resource Strain: Not on file  Food Insecurity: Not on file  Transportation Needs: Not on file  Physical Activity: Not on file  Stress: Not on file  Social Connections: Not on file    Review of Systems: A 12 point ROS discussed and pertinent positives are indicated in the HPI above.  All other systems are negative.  Review of Systems  Constitutional:  Negative for appetite change and fatigue.  Respiratory:  Negative for cough and shortness of breath.   Cardiovascular:  Negative for chest pain and leg swelling.  Gastrointestinal:  Positive for diarrhea. Negative for abdominal pain, nausea and vomiting.  Musculoskeletal:  Negative for back pain.  Neurological:  Negative for dizziness and headaches.    Vital Signs: BP (!) 172/70   Pulse 64  Temp 97.7 F (36.5 C) (Oral)   Resp 12   Ht 5' (1.524 m)   Wt 149 lb 14.6 oz (68 kg)   SpO2 95%   BMI 29.28 kg/m   Physical Exam Constitutional:      General: She is not in acute distress.    Appearance: She is not ill-appearing.  HENT:     Mouth/Throat:     Mouth: Mucous membranes are moist.     Pharynx: Oropharynx is clear.  Cardiovascular:     Rate and Rhythm: Normal rate and regular rhythm.     Pulses: Normal pulses.     Heart sounds: Normal heart sounds.  Pulmonary:     Effort: Pulmonary effort is normal.     Breath sounds: Normal breath sounds.  Abdominal:     General: Bowel sounds are normal.     Palpations: Abdomen is soft.     Tenderness: There is no abdominal tenderness.  Musculoskeletal:     Right lower leg: No edema.     Left lower leg: No edema.  Skin:    General: Skin is warm and dry.  Neurological:     Mental Status: She is alert and oriented to person, place, and time.  Psychiatric:        Mood and Affect: Mood normal.        Behavior: Behavior normal.        Thought Content: Thought content normal.        Judgment: Judgment normal.     Imaging: CT ANGIO GI BLEED  Result Date:  08/01/2022 CLINICAL DATA:  Mid abdominal pain. Bright red rectal bleeding. End-stage renal disease. EXAM: CTA ABDOMEN AND PELVIS WITHOUT AND WITH CONTRAST TECHNIQUE: Multidetector CT imaging of the abdomen and pelvis was performed using the standard protocol during bolus administration of intravenous contrast. Multiplanar reconstructed images and MIPs were obtained and reviewed to evaluate the vascular anatomy. RADIATION DOSE REDUCTION: This exam was performed according to the departmental dose-optimization program which includes automated exposure control, adjustment of the mA and/or kV according to patient size and/or use of iterative reconstruction technique. CONTRAST:  64m OMNIPAQUE IOHEXOL 350 MG/ML SOLN COMPARISON:  09/07/2020 FINDINGS: VASCULAR Aorta: Atheromatous wall thickening and calcification of the aorta. No aneurysm or dissection Celiac: Atheromatous plaque at the origin accentuated by median arcuate ligament based on morphology, but without critical stenosis. SMA: Moderate origin stenosis due to calcified plaque with mild poststenotic dilatation. No acute finding. Renals: Symmetrically enhancing renal arteries without high-grade proximal stenosis. IMA: Patent Inflow: Extensive atheromatous calcification. Proximal Outflow: Atheromatous plaque with up to 50% narrowing at the right common iliac artery. Veins: Unremarkable Review of the MIP images confirms the above findings. NON-VASCULAR Lower chest: Cardiomegaly and atherosclerosis. Hazy appearance of markings at the lung bases, favor scarring. Hepatobiliary: Some surface lobulation and caudate lobe enlargement suggested, cirrhosis is conceivable but not definite.Cholelithiasis. No acute inflammation or biliary obstruction seen. Pancreas: Unremarkable. Spleen: Unremarkable. Adrenals/Urinary Tract: Negative adrenals. No hydronephrosis or stone. Severe bilateral renal atrophy in keeping with end-stage renal disease history. Simple bilateral renal cysts  measuring up to 11 mm on the right, no follow-up imaging recommended. Unremarkable bladder. Stomach/Bowel: Diverticular colon. Actively intraluminal hemorrhage is seen at a distal transverse diverticulum along the inferior wall, site marked on 6:95. No bowel wall thickening or obstruction. No appendicitis. Lymphatic: Negative for mass or adenopathy Reproductive:Unremarkable for age Other: No ascites or pneumoperitoneum. Musculoskeletal: Extensive spinal degeneration. ER attending made aware via epic chat. IMPRESSION: 1. Active diverticular  hemorrhage at the distal transverse colon. 2. Atherosclerosis with ~ 50% narrowing at the SMA and right common iliac arteries. 3. Additional chronic findings are described above. Electronically Signed   By: Jorje Guild M.D.   On: 08/01/2022 07:28    Labs:  CBC: Recent Labs    08/15/21 1020 08/16/21 0150 03/13/22 1115 05/18/22 0803 08/01/22 0605  WBC 7.7 6.9 9.9  --  7.6  HGB 8.2* 7.5* 10.6* 12.2 10.3*  HCT 25.3* 23.7* 33.7* 36.0 32.9*  PLT 167 171 204  --  166    COAGS: Recent Labs    08/01/22 0605  INR 1.0    BMP: Recent Labs    08/15/21 1020 08/16/21 0150 03/13/22 1115 05/18/22 0803 08/01/22 0448  NA 139 138 139 138 134*  K 4.0 4.5 3.9 5.3* 5.3*  CL 98 96* 92* 98 94*  CO2 '30 30 30  '$ --  28  GLUCOSE 112* 91 102* 103* 108*  BUN 15 32* 20 43* 37*  CALCIUM 8.8* 8.6* 8.6*  --  8.6*  CREATININE 4.13* 6.12* 4.17* 7.10* 6.00*  GFRNONAA 11* 7* 10*  --  7*    LIVER FUNCTION TESTS: Recent Labs    08/14/21 2002 08/15/21 1020 03/13/22 1115 08/01/22 0448  BILITOT 1.1 0.7 0.6 0.9  AST '18 20 31 '$ 42*  ALT '13 15 20 18  '$ ALKPHOS 137* 140* 134* 121  PROT 6.8 7.1 8.2* 8.3*  ALBUMIN 3.1* 3.1* 3.5 3.9    TUMOR MARKERS: No results for input(s): "AFPTM", "CEA", "CA199", "CHROMGRNA" in the last 8760 hours.  Assessment and Plan:  Acute lower GI bleed: Alexa Dougherty, 77 year old female, is tentatively scheduled today for an image-guided  mesenteric angiogram with possible intervention, possible embolization. The procedure was discussed with the patient and her daughter via telephone. The patient speaks/understands a fair amount of English but her daughter provided translation assistance and telephone consent was obtained from the daughter.   Risks and benefits of this procedure were discussed with the patient including, but not limited to bleeding, infection, vascular injury or contrast induced renal failure.  This interventional procedure involves the use of X-rays and because of the nature of the planned procedure, it is possible that we will have prolonged use of X-ray fluoroscopy.  Potential radiation risks to you include (but are not limited to) the following: - A slightly elevated risk for cancer  several years later in life. This risk is typically less than 0.5% percent. This risk is low in comparison to the normal incidence of human cancer, which is 33% for women and 50% for men according to the [place]. - Radiation induced injury can include skin redness, resembling a rash, tissue breakdown / ulcers and hair loss (which can be temporary or permanent).   The likelihood of either of these occurring depends on the difficulty of the procedure and whether you are sensitive to radiation due to previous procedures, disease, or genetic conditions.   IF your procedure requires a prolonged use of radiation, you will be notified and given written instructions for further action.  It is your responsibility to monitor the irradiated area for the 2 weeks following the procedure and to notify your physician if you are concerned that you have suffered a radiation induced injury.    All of the patient's questions were answered, patient is agreeable to proceed. She has been NPO since approximately 5 pm 07/31/22.   Consent signed and in chart.  Thank you for this interesting consult.  I greatly enjoyed meeting Alexa Dougherty and look forward to participating in their care.  A copy of this report was sent to the requesting provider on this date.  Electronically Signed: Soyla Dryer, AGACNP-BC [phone] 08/01/2022, 8:32 AM   I spent a total of 20 Minutes    in face to face in clinical consultation, greater than 50% of which was counseling/coordinating care for mesenteric angiogram with possible intervention.

## 2022-08-01 NOTE — ED Provider Notes (Signed)
[place] EMERGENCY DEPARTMENT AT [facility] Provider Note   CSN: [id number] Arrival date & time: 08/01/22  0422     History  Chief Complaint  Patient presents with   Rectal Bleeding    Alexa Dougherty is a 77 y.o. female.  HPI   Patient with medical history including end-stage renal disease on dialysis Monday Wednesday Friday, anemia, hypertension, diverticulitis, presents with complaints of sudden onset of acute bloody diarrhea, states that woke up this morning, had about 3 episodes, states is bright red blood in appearance, no associated stomach pains no nausea no vomiting she denies any lightheaded dizziness chest pain or shortness of breath.  She states she is not on any anticoag's, states that this happened before and they had to sew it up in her bottom.  States that she got her dialysis yesterday, she is not having any other complaints  Reviewed patient's chart was seen last year for similar presentation, found to have bleeding diverticuli, IR was consulted and they were able to repair.  Home Medications Prior to Admission medications   Medication Sig Start Date End Date Taking? Authorizing Provider  acetaminophen (TYLENOL) 500 MG tablet Take 500 mg by mouth every 6 (six) hours as needed for moderate pain or headache.    [provider]  amLODipine (NORVASC) 10 MG tablet Take 10 mg by mouth every evening. 10/14/14   [provider]  atorvastatin (LIPITOR) 10 MG tablet Take 10 mg by mouth every evening.     [provider]  cinacalcet (SENSIPAR) 60 MG tablet Take 60 mg by mouth at bedtime.    [provider]  dexlansoprazole (DEXILANT) 60 MG capsule Take 60 mg by mouth daily.     [provider]  hydrALAZINE (APRESOLINE) 25 MG tablet Take 25 mg by mouth 3 (three) times daily.    [provider]  lidocaine-prilocaine (EMLA) cream Apply 1 application topically See admin instructions. Apply a small amount to access  site (AVF) 1 to 2 hours before dialysis. Cover with occlusive dressing (saran warp). 08/27/18   [provider]  metoprolol tartrate (LOPRESSOR) 50 MG tablet Take 1 tablet (50 mg total) by mouth 2 (two) times daily. 09/13/18   Meccariello, Bernita Raisin, MD  multivitamin (RENA-VIT) TABS tablet Take 1 tablet by mouth daily.    [provider]  sevelamer carbonate (RENVELA) 800 MG tablet Take 3 tablets (2,400 mg total) by mouth 3 (three) times daily with meals. 09/13/18   Meccariello, Bernita Raisin, MD      Allergies    Patient has no known allergies.    Review of Systems   Review of Systems  Constitutional:  Negative for chills and fever.  Respiratory:  Negative for shortness of breath.   Cardiovascular:  Negative for chest pain.  Gastrointestinal:  Positive for blood in stool and diarrhea. Negative for abdominal pain.  Neurological:  Negative for headaches.    Physical Exam Updated Vital Signs BP (!) 167/67   Pulse 65   Temp 98.5 F (36.9 C) (Oral)   Resp 15   Ht 5' (1.524 m)   Wt 68 kg   SpO2 97%   BMI 29.28 kg/m  Physical Exam Vitals and nursing note reviewed. Exam conducted with a chaperone present.  Constitutional:      General: She is not in acute distress.    Appearance: She is not ill-appearing.  HENT:     Head: Normocephalic and atraumatic.     Nose: No congestion.  Eyes:     Conjunctiva/sclera: Conjunctivae normal.  Cardiovascular:     Rate and Rhythm: Normal rate and regular rhythm.     Pulses: Normal pulses.     Heart sounds: No murmur heard.    No friction rub. No gallop.  Pulmonary:     Effort: No respiratory distress.     Breath sounds: No wheezing, rhonchi or rales.  Abdominal:     Palpations: Abdomen is soft.     Tenderness: There is no abdominal tenderness. There is no right CVA tenderness or left CVA tenderness.  Genitourinary:    Comments: With chaperone present rectum was visualized, she has noted external hemorrhoids at the 12 and 1 o'clock  position nonthrombosed, there is no noted bloody stools no drainage or discharge present, digital rectal exam was performed, no large stool burden present, no palpable mass, patient had noted frank bloody stool. Skin:    General: Skin is warm and dry.     Comments: Fistula noted on the left AC, good palpable thrill no evidence of infection present.  Neurological:     Mental Status: She is alert.  Psychiatric:        Mood and Affect: Mood normal.     ED Results / Procedures / Treatments   Labs (all labs ordered are listed, but only abnormal results are displayed) Labs Reviewed  COMPREHENSIVE METABOLIC PANEL - Abnormal; Notable for the following components:      Result Value   Sodium 134 (*)    Potassium 5.3 (*)    Chloride 94 (*)    Glucose, Bld 108 (*)    BUN 37 (*)    Creatinine, Ser 6.00 (*)    Calcium 8.6 (*)    Total Protein 8.3 (*)    AST 42 (*)    GFR, Estimated 7 (*)    All other components within normal limits  CBC WITH DIFFERENTIAL/PLATELET - Abnormal; Notable for the following components:   RBC 3.68 (*)    Hemoglobin 10.3 (*)    HCT 32.9 (*)    All other components within normal limits  PROTIME-INR  CBC WITH DIFFERENTIAL/PLATELET  TYPE AND SCREEN    EKG None  Radiology No results found.  Procedures Procedures    Medications Ordered in ED Medications  sodium chloride (PF) 0.9 % injection (has no administration in time range)  pantoprazole (PROTONIX) injection 40 mg (40 mg Intravenous Given 08/01/22 0634)  iohexol (OMNIPAQUE) 350 MG/ML injection 80 mL (80 mLs Intravenous Contrast Given 08/01/22 0701)    ED Course/ Medical Decision Making/ A&P                             Medical Decision Making Amount and/or Complexity of Data Reviewed Labs: ordered. Radiology: ordered.  Risk Prescription drug management.   This patient presents to the ED for concern of bloody stool, this involves an extensive number of treatment options, and is a complaint  that carries with it a high risk of complications and morbidity.  The differential diagnosis includes lower GI bleed, hemorrhoids, anemia    Additional history obtained:  Additional history obtained from N/A External records from outside source obtained and reviewed including gastroenterology note    Co morbidities that complicate the patient evaluation  End-stage renal disease on dialysis  Social Determinants of Health:  N/a    Lab Tests:  I Ordered, and personally interpreted labs.  The pertinent results include: CBC shows worsening anemia  hemoglobin 10.3, CMP reveals sodium 134 potassium 3.5, chloride 94, glucose 108, BUN 37, AST 42, type and screen pending, INR unremarkable   Imaging Studies ordered:  I ordered imaging studies including CTA abdomen I independently visualized and interpreted imaging which showed pending I agree with the radiologist interpretation   Cardiac Monitoring:  The patient was maintained on a cardiac monitor.  I personally viewed and interpreted the cardiac monitored which showed an underlying rhythm of: Pending   Medicines ordered and prescription drug management:  I ordered medication including Protonix I have reviewed the patients home medicines and have made adjustments as needed  Critical Interventions:  N/A   Reevaluation:  Presents with onset of bloody diarrhea, concern for bleeding diverticuli, will obtain lab work imaging continue to monitor  Hemoglobin stable, blood pressure remains stabilized, will continue to monitor.  Consultations Obtained:  I requested consultation with the gastroenterology Dr. Candis Schatz,  and discussed lab and imaging findings as well as pertinent plan - they recommend: Sent a secure chat informing him of patient as his current plan is to admit to medicine with GI consultation.   Test Considered:  N/A    Rule out Suspicion patient needs emergent blood at this time is low at this time she is  not on toxic appearing, vital signs reassuring, hemoglobin peers to be at baseline.  Suspicion for emergent hemodialysis is also low at this time does not appear to be volume overloaded, lung sounds are clear, no significant electrolyte derailment, potassium is hemolyzed.   Dispostion and problem list  Due to shift change patient will be handed over to [facility]  Follow-up on CT abdomen, will need to be admitted to medicine, likely need transfer to [facility] as she require dialysis, consult with nephrology for further recommendations.            Final Clinical Impression(s) / ED Diagnoses Final diagnoses:  Lower GI bleed  Hematochezia    Rx / DC Orders ED Discharge Orders     None         Marcello Fennel, PA-C 08/01/22 0720    Ripley Fraise, MD 08/01/22 [phone]

## 2022-08-01 NOTE — ED Notes (Signed)
ED TO INPATIENT HANDOFF REPORT  ED Nurse Name and Phone #: Sylvan Cheese Name/Age/Gender Alexa Dougherty 77 y.o. female Room/Bed: WA25/WA25  Code Status   Code Status: Full Code  Home/SNF/Other Home Patient oriented to: self, place, time, and situation Is this baseline? Yes   Triage Complete: Triage complete  Chief Complaint History of GI diverticular bleed [Z87.19]  Triage Note Patient reports bright red rectal bleeding starting at 0230 this morning. Patient reports mild abdominal pain. Denies nausea, vomiting, and fever. Reports this has happened in the past and they had to "stitch her up on the inside."  Patient is A&Ox4 and in NAD at time of triage.    Allergies No Known Allergies  Level of Care/Admitting Diagnosis ED Disposition     ED Disposition  Admit   Condition  --   Comment  Hospital Area: [place] [[id number]]  Level of Care: Progressive [102]  Admit to Progressive based on following criteria: GI, ENDOCRINE disease patients with GI bleeding, acute liver failure or pancreatitis, stable with diabetic ketoacidosis or thyrotoxicosis (hypothyroid) state.  May admit patient to [facility] or [facility] if equivalent level of care is available:: No  Covid Evaluation: Asymptomatic - no recent exposure (last 10 days) testing not required  Diagnosis: History of GI diverticular bleed [id number]  Admitting Physician: Clance Boll [id number]  Attending Physician: Clance Boll [id number]  Certification:: I certify this patient will need inpatient services for at least 2 midnights  Estimated Length of Stay: 3          B Medical/Surgery History Past Medical History:  Diagnosis Date   Anemia    Arthritis    Right shoulder   Blood transfusion without reported diagnosis    Yrs ago when she had anemia   Cataract    bilateral repair   Colon polyps    ESRD on hemodialysis ([place])    Gastritis and gastroduodenitis 12/29/2014   GERD  (gastroesophageal reflux disease)    Headache(784.0)    Hx of adenomatous colonic polyps 01/04/2015   Hyperlipemia    Hypertension    Past Surgical History:  Procedure Laterality Date   AV FISTULA PLACEMENT  02/02/2012   Procedure: ARTERIOVENOUS (AV) FISTULA CREATION;  Surgeon: Angelia Mould, MD;  Location: [facility];  Service: Vascular;  Laterality: Left;   AV FISTULA REPAIR     Angioplasty of fistula multiple times Left   COLONOSCOPY  2016   COLONOSCOPY WITH PROPOFOL N/A 12/29/2014   Procedure: COLONOSCOPY WITH PROPOFOL;  Surgeon: Gatha Mayer, MD;  Location: [facility];  Service: Endoscopy;  Laterality: N/A;   ESOPHAGOGASTRODUODENOSCOPY (EGD) WITH PROPOFOL N/A 12/29/2014   Procedure: ESOPHAGOGASTRODUODENOSCOPY (EGD) WITH PROPOFOL;  Surgeon: Gatha Mayer, MD;  Location: [facility];  Service: Endoscopy;  Laterality: N/A;   FISTULOGRAM N/A 04/01/2012   Procedure: FISTULOGRAM;  Surgeon: Angelia Mould, MD;  Location: [facility] CATH LAB;  Service: Cardiovascular;  Laterality: N/A;   INSERTION OF DIALYSIS CATHETER  02/02/2012   Procedure: INSERTION OF DIALYSIS CATHETER;  Surgeon: Angelia Mould, MD;  Location: [facility] [facility];  Service: Vascular;  Laterality: Right;  Insertion of 23cm dialysis catheter in Right Internal Jugular    INSERTION OF DIALYSIS CATHETER Left 05/18/2022   Procedure: INSERTION OF LEFT FEMORAL VEIN TUNNELED DIALYSIS CATHETER;  Surgeon: Cherre Robins, MD;  Location: [facility];  Service: Vascular;  Laterality: Left;   IR ANGIOGRAM SELECTIVE EACH ADDITIONAL VESSEL  09/06/2020   IR ANGIOGRAM SELECTIVE EACH ADDITIONAL  VESSEL  09/06/2020   IR ANGIOGRAM SELECTIVE EACH ADDITIONAL VESSEL  09/06/2020   IR ANGIOGRAM SELECTIVE EACH ADDITIONAL VESSEL  08/01/2022   IR ANGIOGRAM SELECTIVE EACH ADDITIONAL VESSEL  08/01/2022   IR ANGIOGRAM SELECTIVE EACH ADDITIONAL VESSEL  08/01/2022   IR ANGIOGRAM VISCERAL SELECTIVE  09/06/2020   IR ANGIOGRAM VISCERAL SELECTIVE  08/01/2022   IR EMBO ART   VEN HEMORR LYMPH EXTRAV  INC GUIDE ROADMAPPING  09/06/2020   IR EMBO ART  VEN HEMORR LYMPH EXTRAV  INC GUIDE ROADMAPPING  08/01/2022   IR US GUIDE VASC ACCESS RIGHT  09/06/2020   IR US GUIDE VASC ACCESS RIGHT  08/01/2022   REVISON OF ARTERIOVENOUS FISTULA Left 05/18/2022   Procedure: REVISON OF LEFT ARTERIOVENOUS FISTULA;  Surgeon: Cherre Robins, MD;  Location: [facility];  Service: Vascular;  Laterality: Left;   [place]     A IV Location/Drains/Wounds Patient Lines/Drains/Airways Status     Active Line/Drains/Airways     Name Placement date Placement time Site Days   Peripheral IV 08/01/22 20 G 2.5" Right;Upper;Lateral Arm 08/01/22  0605  Arm  less than 1   Peripheral IV 08/01/22 22 G 2.5" Anterior;Right Forearm 08/01/22  0610  Forearm  less than 1   Fistula / Graft Left Upper arm Arteriovenous fistula 02/02/12  1110  Upper arm  3833   Hemodialysis Catheter Left Femoral vein Double lumen Permanent (Tunneled) 05/18/22  1155  Femoral vein  75   Wound / Incision (Open or Dehisced) 09/06/20 Puncture Groin Right 09/06/20  1556  Groin  694            Intake/Output Last 24 hours No intake or output data in the 24 hours ending 08/01/22 1954  Labs/Imaging Results for orders placed or performed during the hospital encounter of 08/01/22 (from the past 48 hour(s))  Comprehensive metabolic panel     Status: Abnormal   Collection Time: 08/01/22  4:48 AM  Result Value Ref Range   Sodium 134 (L) 135 - 145 mmol/L   Potassium 5.3 (H) 3.5 - 5.1 mmol/L    Comment: HEMOLYSIS AT THIS LEVEL MAY AFFECT RESULT   Chloride 94 (L) 98 - 111 mmol/L   CO2 28 22 - 32 mmol/L   Glucose, Bld 108 (H) 70 - 99 mg/dL    Comment: Glucose reference range applies only to samples taken after fasting for at least 8 hours.   BUN 37 (H) 8 - 23 mg/dL   Creatinine, Ser 6.00 (H) 0.44 - 1.00 mg/dL   Calcium 8.6 (L) 8.9 - 10.3 mg/dL   Total Protein 8.3 (H) 6.5 - 8.1 g/dL   Albumin 3.9 3.5 - 5.0 g/dL   AST 42  (H) 15 - 41 U/L    Comment: HEMOLYSIS AT THIS LEVEL MAY AFFECT RESULT   ALT 18 0 - 44 U/L    Comment: HEMOLYSIS AT THIS LEVEL MAY AFFECT RESULT   Alkaline Phosphatase 121 38 - 126 U/L   Total Bilirubin 0.9 0.3 - 1.2 mg/dL    Comment: HEMOLYSIS AT THIS LEVEL MAY AFFECT RESULT   GFR, Estimated 7 (L) >60 mL/min    Comment: (NOTE) Calculated using the CKD-EPI Creatinine Equation (2021)    Anion gap 12 5 - 15    Comment: Performed at [facility], [place] [address] [place], [place] [address]  Protime-INR     Status: None   Collection Time: 08/01/22  6:05 AM  Result Value Ref Range   Prothrombin  Time 12.9 11.4 - 15.2 seconds   INR 1.0 0.8 - 1.2    Comment: (NOTE) INR goal varies based on device and disease states. Performed at [facility], [place] [address] [place], [place] [address]   Type and screen [place]     Status: None   Collection Time: 08/01/22  6:05 AM  Result Value Ref Range   ABO/RH(D) A POS    Antibody Screen NEG    Sample Expiration      08/04/2022,2359 Performed at [facility], [place] [address] [place], [place] [address]   CBC with Differential/Platelet     Status: Abnormal   Collection Time: 08/01/22  6:05 AM  Result Value Ref Range   WBC 7.6 4.0 - 10.5 K/uL   RBC 3.68 (L) 3.87 - 5.11 MIL/uL   Hemoglobin 10.3 (L) 12.0 - 15.0 g/dL   HCT 32.9 (L) 36.0 - 46.0 %   MCV 89.4 80.0 - 100.0 fL   MCH 28.0 26.0 - 34.0 pg   MCHC 31.3 30.0 - 36.0 g/dL   RDW 15.1 11.5 - 15.5 %   Platelets 166 150 - 400 K/uL   nRBC 0.0 0.0 - 0.2 %   Neutrophils Relative % 75 %   Neutro Abs 5.7 1.7 - 7.7 K/uL   Lymphocytes Relative 12 %   Lymphs Abs 0.9 0.7 - 4.0 K/uL   Monocytes Relative 9 %   Monocytes Absolute 0.7 0.1 - 1.0 K/uL   Eosinophils Relative 3 %   Eosinophils Absolute 0.2 0.0 - 0.5 K/uL   Basophils Relative 1 %   Basophils Absolute 0.1 0.0 - 0.1 K/uL   Immature Granulocytes 0 %   Abs Immature  Granulocytes 0.02 0.00 - 0.07 K/uL    Comment: Performed at [facility], [place] [address] [place], [place] [address]  TSH     Status: None   Collection Time: 08/01/22  6:05 AM  Result Value Ref Range   TSH 1.619 0.350 - 4.500 uIU/mL    Comment: Performed by a 3rd Generation assay with a functional sensitivity of <=0.01 uIU/mL. Performed at [facility] : [facility], [place] [address], [place], [place] [address]   Potassium     Status: None   Collection Time: 08/01/22  8:52 AM  Result Value Ref Range   Potassium 4.7 3.5 - 5.1 mmol/L    Comment: Performed at [facility], [place] [address] [place], [place] [address]  Hemoglobin and hematocrit, blood     Status: Abnormal   Collection Time: 08/01/22  8:52 AM  Result Value Ref Range   Hemoglobin 9.7 (L) 12.0 - 15.0 g/dL   HCT 31.0 (L) 36.0 - 46.0 %    Comment: Performed at [facility], [place] [address], [place], [place] [address]  Hemoglobin and hematocrit, blood     Status: Abnormal   Collection Time: 08/01/22 12:55 PM  Result Value Ref Range   Hemoglobin 8.8 (L) 12.0 - 15.0 g/dL   HCT 28.3 (L) 36.0 - 46.0 %    Comment: Performed at [facility], [place] [address], [place], [place] [address]   IR Angiogram Visceral Selective  Result Date: 08/01/2022 INDICATION: Lower GI bleed with rectal bleeding. Positive CTA with active contrast extravasation at the level of the distal transverse colon near the splenic flexure. 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 EXAM:  1. ULTRASOUND GUIDANCE FOR VASCULAR ACCESS OF THE RIGHT COMMON FEMORAL ARTERY 2. SELECTIVE ARTERIOGRAPHY OF THE SUPERIOR MESENTERIC ARTERY 3. ADDITIONAL SELECTIVE ARTERIOGRAPHY OF SECOND ORDER BRANCH OF SUPERIOR MESENTERIC ARTERY 4. ADDITIONAL SELECTIVE ARTERIOGRAPHY OF THIRD ORDER TRANSVERSE COLONIC BRANCH OF SUPERIOR  MESENTERIC ARTERY 5. ADDITIONAL SELECTIVE ARTERIOGRAPHY OF FOURTH ORDER SUPPLY TO THE TRANSVERSE COLON 6. TRANSCATHETER EMBOLIZATION OF ARTERIAL SUPPLY TO THE TRANSVERSE COLON TO TREAT HEMORRHAGE MEDICATIONS: NONE ANESTHESIA/SEDATION: Moderate (conscious) sedation was employed during this procedure. A total of Versed 2.0 mg and Fentanyl 100 mcg was administered intravenously. Moderate Sedation Time: 83 minutes. The patient's level of consciousness and vital signs were monitored continuously by radiology nursing throughout the procedure under my direct supervision. CONTRAST:  50 ML OMNIPAQUE 300 FLUOROSCOPY TIME:  Radiation Exposure Index (as provided by the fluoroscopic device): 99991111 mGy Kerma COMPLICATIONS: None immediate. PROCEDURE: Informed consent was obtained from the patient and her daughter following explanation of the procedure, risks, benefits and alternatives. The patient's daughter understands, agrees and consents for the procedure. All questions were addressed. A time out was performed prior to the initiation of the procedure. Maximal barrier sterile technique utilized including caps, mask, sterile gowns, sterile gloves, large sterile drape, hand hygiene, and chlorhexidine prep. During the procedure local anesthesia was provided with 1% lidocaine. Ultrasound was used to confirm patency of the right common femoral artery. A permanent ultrasound image was saved and recorded. Access of the right common femoral artery was performed under direct ultrasound guidance with a micropuncture set. After obtaining arterial access, a 5 [place] vascular sheath was advanced over a guidewire. A 5 French Cobra catheter was advanced into the abdominal aorta. This was used to selectively catheterize the superior mesenteric artery. Selective arteriography was performed of the SMA. The 5 French catheter was further advanced into the SMA trunk. A Progreat Alpha microcatheter was advanced through the 5 French catheter and into a  second order branch supplying the transverse colon. Selective arteriography was performed in this branch via the microcatheter. The microcatheter was then further advanced into a left-sided branch of the transverse colonic artery supplying the mid to distal transverse colon. Selective arteriography was performed via the microcatheter. The microcatheter was further advanced into an additional branch of this artery and selective arteriography performed. The microcatheter was then further advanced into a further additional branch of the artery and selective arteriography performed. Attempt was made to further advance the microcatheter in this branch over a guidewire. The microcatheter was then retracted and additional arteriography performed. A slurry of Gel-Foam pledgets was then made and diluted in saline and contrast. Embolization was then performed through the microcatheter at the level of distal transverse colonic arterial supply with diluted Gel-Foam pledgets. Additional arteriography was performed through the microcatheter. Catheters were removed. Fluoroscopic injection of contrast was performed at the level of the arteriotomy via the 5 French sheath to assess level of femoral puncture and patency of the artery prior to closure. Arteriotomy closure was performed using a 6 [place] Angio-Seal device. FINDINGS: The superior mesenteric artery is normally patent. One of the early right-sided branches of the SMA trunk supplies the transverse colon and demonstrates an early bifurcation into proximal and distal branches. This was catheterized and selective arteriography performed. Further selective arteriography was then performed of the left sided branch of this trunk that supplies the mid to distal transverse colon. Selective arteriography further distally in this trunk demonstrates active contrast extravasation from a focal inferior branch along the mesenteric aspect of the distal transverse  colon near the splenic  flexure. This is in the region of contrast extravasation seen by CT angiography. Attempt to further selectively catheterize the arcade along the mesenteric aspect of the colon at this level to place embolization coils was difficult due to tortuosity and small size of the vessels. Initially, a microcatheter was able to be advanced into the proximal aspect of the arcade but this did result in dissection of the artery with occlusion and the microcatheter had to be retracted. After initial occlusion, the vessel did spontaneously open back up after a few minutes which was confirmed by arteriography. As sub-selective coil embolization was not possible, decision was made to proceed with Gel-Foam embolization of the distal transverse arterial supply in order to treat the arterial hemorrhage. This resulted in slow flow and cessation of visualization of smaller distal branch vessels. No further active contrast extravasation was seen after embolization. IMPRESSION: 1. Active contrast extravasation in the region of contrast extravasation seen by CT angiography at the level of the distal transverse colon near the splenic flexure from distal arterial supply of the transverse colonic arcade. 2. Gel-Foam embolization was performed of distal transverse colonic arterial supply as coil embolization could not be performed due to dissection of an arterial branch supplying the region of focal bleeding along the mesenteric aspect of distal transverse colonic arterial supply. The dissection did eventually open back up but sub selective coil embolization was not possible. Electronically Signed   By: Aletta Edouard M.D.   On: 08/01/2022 12:04   IR US Guide Vasc Access Right  Result Date: 08/01/2022 INDICATION: Lower GI bleed with rectal bleeding. Positive CTA with active contrast extravasation at the level of the distal transverse colon near the splenic flexure. History of prior transcatheter embolization for diverticular bleed at the  level of the ascending colon near the hepatic flexure in 2022. EXAM: 1. ULTRASOUND GUIDANCE FOR VASCULAR ACCESS OF THE RIGHT COMMON FEMORAL ARTERY 2. SELECTIVE ARTERIOGRAPHY OF THE SUPERIOR MESENTERIC ARTERY 3. ADDITIONAL SELECTIVE ARTERIOGRAPHY OF SECOND ORDER BRANCH OF SUPERIOR MESENTERIC ARTERY 4. ADDITIONAL SELECTIVE ARTERIOGRAPHY OF THIRD ORDER TRANSVERSE COLONIC BRANCH OF SUPERIOR MESENTERIC ARTERY 5. ADDITIONAL SELECTIVE ARTERIOGRAPHY OF FOURTH ORDER SUPPLY TO THE TRANSVERSE COLON 6. TRANSCATHETER EMBOLIZATION OF ARTERIAL SUPPLY TO THE TRANSVERSE COLON TO TREAT HEMORRHAGE MEDICATIONS: NONE ANESTHESIA/SEDATION: Moderate (conscious) sedation was employed during this procedure. A total of Versed 2.0 mg and Fentanyl 100 mcg was administered intravenously. Moderate Sedation Time: 83 minutes. The patient's level of consciousness and vital signs were monitored continuously by radiology nursing throughout the procedure under my direct supervision. CONTRAST:  50 ML OMNIPAQUE 300 FLUOROSCOPY TIME:  Radiation Exposure Index (as provided by the fluoroscopic device): 99991111 mGy Kerma COMPLICATIONS: None immediate. PROCEDURE: Informed consent was obtained from the patient and her daughter following explanation of the procedure, risks, benefits and alternatives. The patient's daughter understands, agrees and consents for the procedure. All questions were addressed. A time out was performed prior to the initiation of the procedure. Maximal barrier sterile technique utilized including caps, mask, sterile gowns, sterile gloves, large sterile drape, hand hygiene, and chlorhexidine prep. During the procedure local anesthesia was provided with 1% lidocaine. Ultrasound was used to confirm patency of the right common femoral artery. A permanent ultrasound image was saved and recorded. Access of the right common femoral artery was performed under direct ultrasound guidance with a micropuncture set. After obtaining arterial access, a 5  [place] vascular sheath was advanced over a guidewire. A 5 French Cobra catheter was advanced into  the abdominal aorta. This was used to selectively catheterize the superior mesenteric artery. Selective arteriography was performed of the SMA. The 5 French catheter was further advanced into the SMA trunk. A Progreat Alpha microcatheter was advanced through the 5 French catheter and into a second order branch supplying the transverse colon. Selective arteriography was performed in this branch via the microcatheter. The microcatheter was then further advanced into a left-sided branch of the transverse colonic artery supplying the mid to distal transverse colon. Selective arteriography was performed via the microcatheter. The microcatheter was further advanced into an additional branch of this artery and selective arteriography performed. The microcatheter was then further advanced into a further additional branch of the artery and selective arteriography performed. Attempt was made to further advance the microcatheter in this branch over a guidewire. The microcatheter was then retracted and additional arteriography performed. A slurry of Gel-Foam pledgets was then made and diluted in saline and contrast. Embolization was then performed through the microcatheter at the level of distal transverse colonic arterial supply with diluted Gel-Foam pledgets. Additional arteriography was performed through the microcatheter. Catheters were removed. Fluoroscopic injection of contrast was performed at the level of the arteriotomy via the 5 French sheath to assess level of femoral puncture and patency of the artery prior to closure. Arteriotomy closure was performed using a 6 [place] Angio-Seal device. FINDINGS: The superior mesenteric artery is normally patent. One of the early right-sided branches of the SMA trunk supplies the transverse colon and demonstrates an early bifurcation into proximal and distal branches. This was  catheterized and selective arteriography performed. Further selective arteriography was then performed of the left sided branch of this trunk that supplies the mid to distal transverse colon. Selective arteriography further distally in this trunk demonstrates active contrast extravasation from a focal inferior branch along the mesenteric aspect of the distal transverse colon near the splenic flexure. This is in the region of contrast extravasation seen by CT angiography. Attempt to further selectively catheterize the arcade along the mesenteric aspect of the colon at this level to place embolization coils was difficult due to tortuosity and small size of the vessels. Initially, a microcatheter was able to be advanced into the proximal aspect of the arcade but this did result in dissection of the artery with occlusion and the microcatheter had to be retracted. After initial occlusion, the vessel did spontaneously open back up after a few minutes which was confirmed by arteriography. As sub-selective coil embolization was not possible, decision was made to proceed with Gel-Foam embolization of the distal transverse arterial supply in order to treat the arterial hemorrhage. This resulted in slow flow and cessation of visualization of smaller distal branch vessels. No further active contrast extravasation was seen after embolization. IMPRESSION: 1. Active contrast extravasation in the region of contrast extravasation seen by CT angiography at the level of the distal transverse colon near the splenic flexure from distal arterial supply of the transverse colonic arcade. 2. Gel-Foam embolization was performed of distal transverse colonic arterial supply as coil embolization could not be performed due to dissection of an arterial branch supplying the region of focal bleeding along the mesenteric aspect of distal transverse colonic arterial supply. The dissection did eventually open back up but sub selective coil  embolization was not possible. Electronically Signed   By: Aletta Edouard M.D.   On: 08/01/2022 12:04   IR Angiogram Selective Each Additional Vessel  Result Date: 08/01/2022 INDICATION: Lower GI bleed with rectal bleeding. Positive CTA with  active contrast extravasation at the level of the distal transverse colon near the splenic flexure. 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 EXAM: 1. ULTRASOUND GUIDANCE FOR VASCULAR ACCESS OF THE RIGHT COMMON FEMORAL ARTERY 2. SELECTIVE ARTERIOGRAPHY OF THE SUPERIOR MESENTERIC ARTERY 3. ADDITIONAL SELECTIVE ARTERIOGRAPHY OF SECOND ORDER BRANCH OF SUPERIOR MESENTERIC ARTERY 4. ADDITIONAL SELECTIVE ARTERIOGRAPHY OF THIRD ORDER TRANSVERSE COLONIC BRANCH OF SUPERIOR MESENTERIC ARTERY 5. ADDITIONAL SELECTIVE ARTERIOGRAPHY OF FOURTH ORDER SUPPLY TO THE TRANSVERSE COLON 6. TRANSCATHETER EMBOLIZATION OF ARTERIAL SUPPLY TO THE TRANSVERSE COLON TO TREAT HEMORRHAGE MEDICATIONS: NONE ANESTHESIA/SEDATION: Moderate (conscious) sedation was employed during this procedure. A total of Versed 2.0 mg and Fentanyl 100 mcg was administered intravenously. Moderate Sedation Time: 83 minutes. The patient's level of consciousness and vital signs were monitored continuously by radiology nursing throughout the procedure under my direct supervision. CONTRAST:  50 ML OMNIPAQUE 300 FLUOROSCOPY TIME:  Radiation Exposure Index (as provided by the fluoroscopic device): 99991111 mGy Kerma COMPLICATIONS: None immediate. PROCEDURE: Informed consent was obtained from the patient and her daughter following explanation of the procedure, risks, benefits and alternatives. The patient's daughter understands, agrees and consents for the procedure. All questions were addressed. A time out was performed prior to the initiation of the procedure. Maximal barrier sterile technique utilized including caps, mask, sterile gowns,  sterile gloves, large sterile drape, hand hygiene, and chlorhexidine prep. During the procedure local anesthesia was provided with 1% lidocaine. Ultrasound was used to confirm patency of the right common femoral artery. A permanent ultrasound image was saved and recorded. Access of the right common femoral artery was performed under direct ultrasound guidance with a micropuncture set. After obtaining arterial access, a 5 [place] vascular sheath was advanced over a guidewire. A 5 French Cobra catheter was advanced into the abdominal aorta. This was used to selectively catheterize the superior mesenteric artery. Selective arteriography was performed of the SMA. The 5 French catheter was further advanced into the SMA trunk. A Progreat Alpha microcatheter was advanced through the 5 French catheter and into a second order branch supplying the transverse colon. Selective arteriography was performed in this branch via the microcatheter. The microcatheter was then further advanced into a left-sided branch of the transverse colonic artery supplying the mid to distal transverse colon. Selective arteriography was performed via the microcatheter. The microcatheter was further advanced into an additional branch of this artery and selective arteriography performed. The microcatheter was then further advanced into a further additional branch of the artery and selective arteriography performed. Attempt was made to further advance the microcatheter in this branch over a guidewire. The microcatheter was then retracted and additional arteriography performed. A slurry of Gel-Foam pledgets was then made and diluted in saline and contrast. Embolization was then performed through the microcatheter at the level of distal transverse colonic arterial supply with diluted Gel-Foam pledgets. Additional arteriography was performed through the microcatheter. Catheters were removed. Fluoroscopic injection of contrast was performed at the level of  the arteriotomy via the 5 French sheath to assess level of femoral puncture and patency of the artery prior to closure. Arteriotomy closure was performed using a 6 [place] Angio-Seal device. FINDINGS: The superior mesenteric artery is normally patent. One of the early right-sided branches of the SMA trunk supplies the transverse colon and demonstrates an early bifurcation into proximal and distal branches. This was catheterized and selective arteriography performed. Further selective arteriography was then performed of the left  sided branch of this trunk that supplies the mid to distal transverse colon. Selective arteriography further distally in this trunk demonstrates active contrast extravasation from a focal inferior branch along the mesenteric aspect of the distal transverse colon near the splenic flexure. This is in the region of contrast extravasation seen by CT angiography. Attempt to further selectively catheterize the arcade along the mesenteric aspect of the colon at this level to place embolization coils was difficult due to tortuosity and small size of the vessels. Initially, a microcatheter was able to be advanced into the proximal aspect of the arcade but this did result in dissection of the artery with occlusion and the microcatheter had to be retracted. After initial occlusion, the vessel did spontaneously open back up after a few minutes which was confirmed by arteriography. As sub-selective coil embolization was not possible, decision was made to proceed with Gel-Foam embolization of the distal transverse arterial supply in order to treat the arterial hemorrhage. This resulted in slow flow and cessation of visualization of smaller distal branch vessels. No further active contrast extravasation was seen after embolization. IMPRESSION: 1. Active contrast extravasation in the region of contrast extravasation seen by CT angiography at the level of the distal transverse colon near the splenic flexure  from distal arterial supply of the transverse colonic arcade. 2. Gel-Foam embolization was performed of distal transverse colonic arterial supply as coil embolization could not be performed due to dissection of an arterial branch supplying the region of focal bleeding along the mesenteric aspect of distal transverse colonic arterial supply. The dissection did eventually open back up but sub selective coil embolization was not possible. Electronically Signed   By: Aletta Edouard M.D.   On: 08/01/2022 12:04   IR Angiogram Selective Each Additional Vessel  Result Date: 08/01/2022 INDICATION: Lower GI bleed with rectal bleeding. Positive CTA with active contrast extravasation at the level of the distal transverse colon near the splenic flexure. 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 EXAM: 1. ULTRASOUND GUIDANCE FOR VASCULAR ACCESS OF THE RIGHT COMMON FEMORAL ARTERY 2. SELECTIVE ARTERIOGRAPHY OF THE SUPERIOR MESENTERIC ARTERY 3. ADDITIONAL SELECTIVE ARTERIOGRAPHY OF SECOND ORDER BRANCH OF SUPERIOR MESENTERIC ARTERY 4. ADDITIONAL SELECTIVE ARTERIOGRAPHY OF THIRD ORDER TRANSVERSE COLONIC BRANCH OF SUPERIOR MESENTERIC ARTERY 5. ADDITIONAL SELECTIVE ARTERIOGRAPHY OF FOURTH ORDER SUPPLY TO THE TRANSVERSE COLON 6. TRANSCATHETER EMBOLIZATION OF ARTERIAL SUPPLY TO THE TRANSVERSE COLON TO TREAT HEMORRHAGE MEDICATIONS: NONE ANESTHESIA/SEDATION: Moderate (conscious) sedation was employed during this procedure. A total of Versed 2.0 mg and Fentanyl 100 mcg was administered intravenously. Moderate Sedation Time: 83 minutes. The patient's level of consciousness and vital signs were monitored continuously by radiology nursing throughout the procedure under my direct supervision. CONTRAST:  50 ML OMNIPAQUE 300 FLUOROSCOPY TIME:  Radiation Exposure Index (as provided by the fluoroscopic device): 99991111 mGy Kerma COMPLICATIONS: None  immediate. PROCEDURE: Informed consent was obtained from the patient and her daughter following explanation of the procedure, risks, benefits and alternatives. The patient's daughter understands, agrees and consents for the procedure. All questions were addressed. A time out was performed prior to the initiation of the procedure. Maximal barrier sterile technique utilized including caps, mask, sterile gowns, sterile gloves, large sterile drape, hand hygiene, and chlorhexidine prep. During the procedure local anesthesia was provided with 1% lidocaine. Ultrasound was used to confirm patency of the right common femoral artery. A permanent ultrasound image was saved and recorded. Access of the  right common femoral artery was performed under direct ultrasound guidance with a micropuncture set. After obtaining arterial access, a 5 [place] vascular sheath was advanced over a guidewire. A 5 French Cobra catheter was advanced into the abdominal aorta. This was used to selectively catheterize the superior mesenteric artery. Selective arteriography was performed of the SMA. The 5 French catheter was further advanced into the SMA trunk. A Progreat Alpha microcatheter was advanced through the 5 French catheter and into a second order branch supplying the transverse colon. Selective arteriography was performed in this branch via the microcatheter. The microcatheter was then further advanced into a left-sided branch of the transverse colonic artery supplying the mid to distal transverse colon. Selective arteriography was performed via the microcatheter. The microcatheter was further advanced into an additional branch of this artery and selective arteriography performed. The microcatheter was then further advanced into a further additional branch of the artery and selective arteriography performed. Attempt was made to further advance the microcatheter in this branch over a guidewire. The microcatheter was then retracted and  additional arteriography performed. A slurry of Gel-Foam pledgets was then made and diluted in saline and contrast. Embolization was then performed through the microcatheter at the level of distal transverse colonic arterial supply with diluted Gel-Foam pledgets. Additional arteriography was performed through the microcatheter. Catheters were removed. Fluoroscopic injection of contrast was performed at the level of the arteriotomy via the 5 French sheath to assess level of femoral puncture and patency of the artery prior to closure. Arteriotomy closure was performed using a 6 [place] Angio-Seal device. FINDINGS: The superior mesenteric artery is normally patent. One of the early right-sided branches of the SMA trunk supplies the transverse colon and demonstrates an early bifurcation into proximal and distal branches. This was catheterized and selective arteriography performed. Further selective arteriography was then performed of the left sided branch of this trunk that supplies the mid to distal transverse colon. Selective arteriography further distally in this trunk demonstrates active contrast extravasation from a focal inferior branch along the mesenteric aspect of the distal transverse colon near the splenic flexure. This is in the region of contrast extravasation seen by CT angiography. Attempt to further selectively catheterize the arcade along the mesenteric aspect of the colon at this level to place embolization coils was difficult due to tortuosity and small size of the vessels. Initially, a microcatheter was able to be advanced into the proximal aspect of the arcade but this did result in dissection of the artery with occlusion and the microcatheter had to be retracted. After initial occlusion, the vessel did spontaneously open back up after a few minutes which was confirmed by arteriography. As sub-selective coil embolization was not possible, decision was made to proceed with Gel-Foam embolization of the  distal transverse arterial supply in order to treat the arterial hemorrhage. This resulted in slow flow and cessation of visualization of smaller distal branch vessels. No further active contrast extravasation was seen after embolization. IMPRESSION: 1. Active contrast extravasation in the region of contrast extravasation seen by CT angiography at the level of the distal transverse colon near the splenic flexure from distal arterial supply of the transverse colonic arcade. 2. Gel-Foam embolization was performed of distal transverse colonic arterial supply as coil embolization could not be performed due to dissection of an arterial branch supplying the region of focal bleeding along the mesenteric aspect of distal transverse colonic arterial supply. The dissection did eventually open back up but sub selective coil embolization was not possible.  Electronically Signed   By: Aletta Edouard M.D.   On: 08/01/2022 12:04   IR Angiogram Selective Each Additional Vessel  Result Date: 08/01/2022 INDICATION: Lower GI bleed with rectal bleeding. Positive CTA with active contrast extravasation at the level of the distal transverse colon near the splenic flexure. 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 EXAM: 1. ULTRASOUND GUIDANCE FOR VASCULAR ACCESS OF THE RIGHT COMMON FEMORAL ARTERY 2. SELECTIVE ARTERIOGRAPHY OF THE SUPERIOR MESENTERIC ARTERY 3. ADDITIONAL SELECTIVE ARTERIOGRAPHY OF SECOND ORDER BRANCH OF SUPERIOR MESENTERIC ARTERY 4. ADDITIONAL SELECTIVE ARTERIOGRAPHY OF THIRD ORDER TRANSVERSE COLONIC BRANCH OF SUPERIOR MESENTERIC ARTERY 5. ADDITIONAL SELECTIVE ARTERIOGRAPHY OF FOURTH ORDER SUPPLY TO THE TRANSVERSE COLON 6. TRANSCATHETER EMBOLIZATION OF ARTERIAL SUPPLY TO THE TRANSVERSE COLON TO TREAT HEMORRHAGE MEDICATIONS: NONE ANESTHESIA/SEDATION: Moderate (conscious) sedation was employed during this procedure. A total of  Versed 2.0 mg and Fentanyl 100 mcg was administered intravenously. Moderate Sedation Time: 83 minutes. The patient's level of consciousness and vital signs were monitored continuously by radiology nursing throughout the procedure under my direct supervision. CONTRAST:  50 ML OMNIPAQUE 300 FLUOROSCOPY TIME:  Radiation Exposure Index (as provided by the fluoroscopic device): 99991111 mGy Kerma COMPLICATIONS: None immediate. PROCEDURE: Informed consent was obtained from the patient and her daughter following explanation of the procedure, risks, benefits and alternatives. The patient's daughter understands, agrees and consents for the procedure. All questions were addressed. A time out was performed prior to the initiation of the procedure. Maximal barrier sterile technique utilized including caps, mask, sterile gowns, sterile gloves, large sterile drape, hand hygiene, and chlorhexidine prep. During the procedure local anesthesia was provided with 1% lidocaine. Ultrasound was used to confirm patency of the right common femoral artery. A permanent ultrasound image was saved and recorded. Access of the right common femoral artery was performed under direct ultrasound guidance with a micropuncture set. After obtaining arterial access, a 5 [place] vascular sheath was advanced over a guidewire. A 5 French Cobra catheter was advanced into the abdominal aorta. This was used to selectively catheterize the superior mesenteric artery. Selective arteriography was performed of the SMA. The 5 French catheter was further advanced into the SMA trunk. A Progreat Alpha microcatheter was advanced through the 5 French catheter and into a second order branch supplying the transverse colon. Selective arteriography was performed in this branch via the microcatheter. The microcatheter was then further advanced into a left-sided branch of the transverse colonic artery supplying the mid to distal transverse colon. Selective arteriography was  performed via the microcatheter. The microcatheter was further advanced into an additional branch of this artery and selective arteriography performed. The microcatheter was then further advanced into a further additional branch of the artery and selective arteriography performed. Attempt was made to further advance the microcatheter in this branch over a guidewire. The microcatheter was then retracted and additional arteriography performed. A slurry of Gel-Foam pledgets was then made and diluted in saline and contrast. Embolization was then performed through the microcatheter at the level of distal transverse colonic arterial supply with diluted Gel-Foam pledgets. Additional arteriography was performed through the microcatheter. Catheters were removed. Fluoroscopic injection of contrast was performed at the level of the arteriotomy via the 5 French sheath to assess level of femoral puncture and patency of the artery prior to closure. Arteriotomy closure was performed using a 6 [place] Angio-Seal device. FINDINGS: The superior mesenteric artery is normally patent. One of the early  right-sided branches of the SMA trunk supplies the transverse colon and demonstrates an early bifurcation into proximal and distal branches. This was catheterized and selective arteriography performed. Further selective arteriography was then performed of the left sided branch of this trunk that supplies the mid to distal transverse colon. Selective arteriography further distally in this trunk demonstrates active contrast extravasation from a focal inferior branch along the mesenteric aspect of the distal transverse colon near the splenic flexure. This is in the region of contrast extravasation seen by CT angiography. Attempt to further selectively catheterize the arcade along the mesenteric aspect of the colon at this level to place embolization coils was difficult due to tortuosity and small size of the vessels. Initially, a microcatheter  was able to be advanced into the proximal aspect of the arcade but this did result in dissection of the artery with occlusion and the microcatheter had to be retracted. After initial occlusion, the vessel did spontaneously open back up after a few minutes which was confirmed by arteriography. As sub-selective coil embolization was not possible, decision was made to proceed with Gel-Foam embolization of the distal transverse arterial supply in order to treat the arterial hemorrhage. This resulted in slow flow and cessation of visualization of smaller distal branch vessels. No further active contrast extravasation was seen after embolization. IMPRESSION: 1. Active contrast extravasation in the region of contrast extravasation seen by CT angiography at the level of the distal transverse colon near the splenic flexure from distal arterial supply of the transverse colonic arcade. 2. Gel-Foam embolization was performed of distal transverse colonic arterial supply as coil embolization could not be performed due to dissection of an arterial branch supplying the region of focal bleeding along the mesenteric aspect of distal transverse colonic arterial supply. The dissection did eventually open back up but sub selective coil embolization was not possible. Electronically Signed   By: Aletta Edouard M.D.   On: 08/01/2022 12:04   IR EMBO ART  VEN HEMORR LYMPH EXTRAV  INC GUIDE ROADMAPPING  Result Date: 08/01/2022 INDICATION: Lower GI bleed with rectal bleeding. Positive CTA with active contrast extravasation at the level of the distal transverse colon near the splenic flexure. 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 EXAM: 1. ULTRASOUND GUIDANCE FOR VASCULAR ACCESS OF THE RIGHT COMMON FEMORAL ARTERY 2. SELECTIVE ARTERIOGRAPHY OF THE SUPERIOR MESENTERIC ARTERY 3. ADDITIONAL SELECTIVE ARTERIOGRAPHY OF SECOND ORDER BRANCH OF  SUPERIOR MESENTERIC ARTERY 4. ADDITIONAL SELECTIVE ARTERIOGRAPHY OF THIRD ORDER TRANSVERSE COLONIC BRANCH OF SUPERIOR MESENTERIC ARTERY 5. ADDITIONAL SELECTIVE ARTERIOGRAPHY OF FOURTH ORDER SUPPLY TO THE TRANSVERSE COLON 6. TRANSCATHETER EMBOLIZATION OF ARTERIAL SUPPLY TO THE TRANSVERSE COLON TO TREAT HEMORRHAGE MEDICATIONS: NONE ANESTHESIA/SEDATION: Moderate (conscious) sedation was employed during this procedure. A total of Versed 2.0 mg and Fentanyl 100 mcg was administered intravenously. Moderate Sedation Time: 83 minutes. The patient's level of consciousness and vital signs were monitored continuously by radiology nursing throughout the procedure under my direct supervision. CONTRAST:  50 ML OMNIPAQUE 300 FLUOROSCOPY TIME:  Radiation Exposure Index (as provided by the fluoroscopic device): 99991111 mGy Kerma COMPLICATIONS: None immediate. PROCEDURE: Informed consent was obtained from the patient and her daughter following explanation of the procedure, risks, benefits and alternatives. The patient's daughter understands, agrees and consents for the procedure. All questions were addressed. A time out was performed prior to the initiation of the procedure. Maximal barrier sterile technique utilized including caps, mask, sterile gowns, sterile  gloves, large sterile drape, hand hygiene, and chlorhexidine prep. During the procedure local anesthesia was provided with 1% lidocaine. Ultrasound was used to confirm patency of the right common femoral artery. A permanent ultrasound image was saved and recorded. Access of the right common femoral artery was performed under direct ultrasound guidance with a micropuncture set. After obtaining arterial access, a 5 [place] vascular sheath was advanced over a guidewire. A 5 French Cobra catheter was advanced into the abdominal aorta. This was used to selectively catheterize the superior mesenteric artery. Selective arteriography was performed of the SMA. The 5 French catheter was  further advanced into the SMA trunk. A Progreat Alpha microcatheter was advanced through the 5 French catheter and into a second order branch supplying the transverse colon. Selective arteriography was performed in this branch via the microcatheter. The microcatheter was then further advanced into a left-sided branch of the transverse colonic artery supplying the mid to distal transverse colon. Selective arteriography was performed via the microcatheter. The microcatheter was further advanced into an additional branch of this artery and selective arteriography performed. The microcatheter was then further advanced into a further additional branch of the artery and selective arteriography performed. Attempt was made to further advance the microcatheter in this branch over a guidewire. The microcatheter was then retracted and additional arteriography performed. A slurry of Gel-Foam pledgets was then made and diluted in saline and contrast. Embolization was then performed through the microcatheter at the level of distal transverse colonic arterial supply with diluted Gel-Foam pledgets. Additional arteriography was performed through the microcatheter. Catheters were removed. Fluoroscopic injection of contrast was performed at the level of the arteriotomy via the 5 French sheath to assess level of femoral puncture and patency of the artery prior to closure. Arteriotomy closure was performed using a 6 [place] Angio-Seal device. FINDINGS: The superior mesenteric artery is normally patent. One of the early right-sided branches of the SMA trunk supplies the transverse colon and demonstrates an early bifurcation into proximal and distal branches. This was catheterized and selective arteriography performed. Further selective arteriography was then performed of the left sided branch of this trunk that supplies the mid to distal transverse colon. Selective arteriography further distally in this trunk demonstrates active contrast  extravasation from a focal inferior branch along the mesenteric aspect of the distal transverse colon near the splenic flexure. This is in the region of contrast extravasation seen by CT angiography. Attempt to further selectively catheterize the arcade along the mesenteric aspect of the colon at this level to place embolization coils was difficult due to tortuosity and small size of the vessels. Initially, a microcatheter was able to be advanced into the proximal aspect of the arcade but this did result in dissection of the artery with occlusion and the microcatheter had to be retracted. After initial occlusion, the vessel did spontaneously open back up after a few minutes which was confirmed by arteriography. As sub-selective coil embolization was not possible, decision was made to proceed with Gel-Foam embolization of the distal transverse arterial supply in order to treat the arterial hemorrhage. This resulted in slow flow and cessation of visualization of smaller distal branch vessels. No further active contrast extravasation was seen after embolization. IMPRESSION: 1. Active contrast extravasation in the region of contrast extravasation seen by CT angiography at the level of the distal transverse colon near the splenic flexure from distal arterial supply of the transverse colonic arcade. 2. Gel-Foam embolization was performed of distal transverse colonic arterial supply as coil embolization  could not be performed due to dissection of an arterial branch supplying the region of focal bleeding along the mesenteric aspect of distal transverse colonic arterial supply. The dissection did eventually open back up but sub selective coil embolization was not possible. Electronically Signed   By: Aletta Edouard M.D.   On: 08/01/2022 12:04   CT ANGIO GI BLEED  Result Date: 08/01/2022 CLINICAL DATA:  Mid abdominal pain. Bright red rectal bleeding. End-stage renal disease. EXAM: CTA ABDOMEN AND PELVIS WITHOUT AND WITH  CONTRAST TECHNIQUE: Multidetector CT imaging of the abdomen and pelvis was performed using the standard protocol during bolus administration of intravenous contrast. Multiplanar reconstructed images and MIPs were obtained and reviewed to evaluate the vascular anatomy. RADIATION DOSE REDUCTION: This exam was performed according to the departmental dose-optimization program which includes automated exposure control, adjustment of the mA and/or kV according to patient size and/or use of iterative reconstruction technique. CONTRAST:  66m OMNIPAQUE IOHEXOL 350 MG/ML SOLN COMPARISON:  09/07/2020 FINDINGS: VASCULAR Aorta: Atheromatous wall thickening and calcification of the aorta. No aneurysm or dissection Celiac: Atheromatous plaque at the origin accentuated by median arcuate ligament based on morphology, but without critical stenosis. SMA: Moderate origin stenosis due to calcified plaque with mild poststenotic dilatation. No acute finding. Renals: Symmetrically enhancing renal arteries without high-grade proximal stenosis. IMA: Patent Inflow: Extensive atheromatous calcification. Proximal Outflow: Atheromatous plaque with up to 50% narrowing at the right common iliac artery. Veins: Unremarkable Review of the MIP images confirms the above findings. NON-VASCULAR Lower chest: Cardiomegaly and atherosclerosis. Hazy appearance of markings at the lung bases, favor scarring. Hepatobiliary: Some surface lobulation and caudate lobe enlargement suggested, cirrhosis is conceivable but not definite.Cholelithiasis. No acute inflammation or biliary obstruction seen. Pancreas: Unremarkable. Spleen: Unremarkable. Adrenals/Urinary Tract: Negative adrenals. No hydronephrosis or stone. Severe bilateral renal atrophy in keeping with end-stage renal disease history. Simple bilateral renal cysts measuring up to 11 mm on the right, no follow-up imaging recommended. Unremarkable bladder. Stomach/Bowel: Diverticular colon. Actively  intraluminal hemorrhage is seen at a distal transverse diverticulum along the inferior wall, site marked on 6:95. No bowel wall thickening or obstruction. No appendicitis. Lymphatic: Negative for mass or adenopathy Reproductive:Unremarkable for age Other: No ascites or pneumoperitoneum. Musculoskeletal: Extensive spinal degeneration. ER attending made aware via epic chat. IMPRESSION: 1. 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 2. Atherosclerosis with ~ 50% narrowing at the SMA and right common iliac arteries. 3. Additional chronic findings are described above. Electronically Signed   By: JJorje GuildM.D.   On: 08/01/2022 07:28    Pending Labs Unresulted Labs (From admission, onward)     Start     Ordered   08/02/22 0500  Comprehensive metabolic panel  Tomorrow morning,   R        08/01/22 1253   08/02/22 0500  CBC  Tomorrow morning,   R        08/01/22 1253   08/02/22 0500  Protime-INR  Tomorrow morning,   R        08/01/22 1253   08/02/22 0500  Phosphorus  Tomorrow morning,   R        08/01/22 1638   08/01/22 1844  Hepatitis B surface antigen  (New Admission Hemo Labs (Hepatitis B))  Once,   R        08/01/22 1845   08/01/22 1844  Hepatitis B surface antibody,quantitative  (New Admission [facility] (Hepatitis B))  Once,   R  08/01/22 1845   08/01/22 1253  Hemoglobin A1c  Once,   R        08/01/22 1253   08/01/22 0448  CBC with Differential  Once,   STAT        08/01/22 0448   Signed and Held  Hemoglobin  Now then every 6 hours,   R      Signed and Held   Signed and Held  Comprehensive metabolic panel  Tomorrow morning,   R        Signed and Held   Signed and Held  CBC  Tomorrow morning,   R        Signed and Held            Vitals/Pain Today's Vitals   08/01/22 1230 08/01/22 1445 08/01/22 1636 08/01/22 1730  BP: (!) 160/62 (!) 165/78  (!) 167/60  Pulse: 64 65  73  Resp: '17 10  15  '$ Temp:   98.9 F (37.2 C)   TempSrc:   Oral   SpO2: 95% 94%   93%  Weight:      Height:      PainSc:        Isolation Precautions No active isolations  Medications Medications  acetaminophen (TYLENOL) tablet 650 mg (has no administration in time range)    Or  acetaminophen (TYLENOL) suppository 650 mg (has no administration in time range)  ondansetron (ZOFRAN) tablet 4 mg (has no administration in time range)    Or  ondansetron (ZOFRAN) injection 4 mg (has no administration in time range)  albuterol (PROVENTIL) (2.5 MG/3ML) 0.083% nebulizer solution 2.5 mg (has no administration in time range)  pantoprazole (PROTONIX) injection 40 mg (has no administration in time range)  Chlorhexidine Gluconate Cloth 2 % PADS 6 each (has no administration in time range)  pantoprazole (PROTONIX) injection 40 mg (40 mg Intravenous Given 08/01/22 0634)  iohexol (OMNIPAQUE) 350 MG/ML injection 80 mL (80 mLs Intravenous Contrast Given 08/01/22 0701)  sodium chloride (PF) 0.9 % injection (  Given by Other 08/01/22 0700)  midazolam (VERSED) injection (1 mg Intravenous Given 08/01/22 1108)  fentaNYL (SUBLIMAZE) injection (50 mcg Intravenous Given 08/01/22 1108)  iohexol (OMNIPAQUE) 300 MG/ML solution 100 mL (30 mLs Intra-arterial Contrast Given 08/01/22 1122)  iohexol (OMNIPAQUE) 300 MG/ML solution 100 mL (10 mLs Intra-arterial Contrast Given 08/01/22 1122)  iohexol (OMNIPAQUE) 300 MG/ML solution 100 mL (10 mLs Intra-arterial Contrast Given 08/01/22 1121)  lidocaine (XYLOCAINE) 1 % (with pres) injection (5 mLs  Given 08/01/22 1003)  gelatin adsorbable (GELFOAM/SURGIFOAM) sponge 12-7 mm 1 each (1 each Topical Given 08/01/22 1121)    Mobility walks with person assist     Focused Assessments Cardiac Assessment Handoff:  Cardiac Rhythm: Normal sinus rhythm Lab Results  Component Value Date   CKTOTAL 39 09/01/2016   TROPONINI <0.03 09/10/2018   Lab Results  Component Value Date   DDIMER 1.17 (H) 09/08/2018   Does the Patient currently have chest pain? No     R Recommendations: See Admitting Provider Note  Report given to:   Additional Notes: AAOx4, family at besides, pt ambulates with assistance.

## 2022-08-01 NOTE — Consult Note (Signed)
[facility] Renal Consultation Note  Requesting MD: Myles Rosenthal, MD Indication for Consultation:  ESRD  Chief complaint: blood per rectum   HPI:  Alexa Dougherty is a 77 y.o. female with a history of ESRD on HD, HTN, and gastritis who presented to [facility] with bright red blood per rectum.  GI was consulted.  She underwent gelfoam embolization earlier today with IR; per IR note no further active bleeding after embolization.  Nephrology was consulted.  The patient is to be transferred to [facility] as she is ESRD.  Note that she had a diverticular bleed a year ago which required IR intervention per charting.  She speaks [place] and this is not offered on the video interpreter service so she asked if she could call her daughter.  She is feeling better but tired and hungry.  She states that she had a BM after the procedure and that now she feels less urgency; she wasn't able to see if there was blood in it.    PMHx:   Past Medical History:  Diagnosis Date   Anemia    Arthritis    Right shoulder   Blood transfusion without reported diagnosis    Yrs ago when she had anemia   Cataract    bilateral repair   Colon polyps    ESRD on hemodialysis ([place])    Gastritis and gastroduodenitis 12/29/2014   GERD (gastroesophageal reflux disease)    Headache(784.0)    Hx of adenomatous colonic polyps 01/04/2015   Hyperlipemia    Hypertension     Past Surgical History:  Procedure Laterality Date   AV FISTULA PLACEMENT  02/02/2012   Procedure: ARTERIOVENOUS (AV) FISTULA CREATION;  Surgeon: Angelia Mould, MD;  Location: [facility] OR;  Service: Vascular;  Laterality: Left;   AV FISTULA REPAIR     Angioplasty of fistula multiple times Left   COLONOSCOPY  2016   COLONOSCOPY WITH PROPOFOL N/A 12/29/2014   Procedure: COLONOSCOPY WITH PROPOFOL;  Surgeon: Gatha Mayer, MD;  Location: [facility] ENDOSCOPY;  Service: Endoscopy;  Laterality: N/A;   ESOPHAGOGASTRODUODENOSCOPY (EGD) WITH PROPOFOL N/A  12/29/2014   Procedure: ESOPHAGOGASTRODUODENOSCOPY (EGD) WITH PROPOFOL;  Surgeon: Gatha Mayer, MD;  Location: [facility] ENDOSCOPY;  Service: Endoscopy;  Laterality: N/A;   FISTULOGRAM N/A 04/01/2012   Procedure: FISTULOGRAM;  Surgeon: Angelia Mould, MD;  Location: [facility] CATH LAB;  Service: Cardiovascular;  Laterality: N/A;   INSERTION OF DIALYSIS CATHETER  02/02/2012   Procedure: INSERTION OF DIALYSIS CATHETER;  Surgeon: Angelia Mould, MD;  Location: [place];  Service: Vascular;  Laterality: Right;  Insertion of 23cm dialysis catheter in Right Internal Jugular    INSERTION OF DIALYSIS CATHETER Left 05/18/2022   Procedure: INSERTION OF LEFT FEMORAL VEIN TUNNELED DIALYSIS CATHETER;  Surgeon: Cherre Robins, MD;  Location: [place];  Service: Vascular;  Laterality: Left;   IR ANGIOGRAM SELECTIVE EACH ADDITIONAL VESSEL  09/06/2020   IR ANGIOGRAM SELECTIVE EACH ADDITIONAL VESSEL  09/06/2020   IR ANGIOGRAM SELECTIVE EACH ADDITIONAL VESSEL  09/06/2020   IR ANGIOGRAM SELECTIVE EACH ADDITIONAL VESSEL  08/01/2022   IR ANGIOGRAM SELECTIVE EACH ADDITIONAL VESSEL  08/01/2022   IR ANGIOGRAM SELECTIVE EACH ADDITIONAL VESSEL  08/01/2022   IR ANGIOGRAM VISCERAL SELECTIVE  09/06/2020   IR ANGIOGRAM VISCERAL SELECTIVE  08/01/2022   IR EMBO ART  VEN HEMORR LYMPH EXTRAV  INC GUIDE ROADMAPPING  09/06/2020   IR EMBO ART  VEN HEMORR LYMPH EXTRAV  INC GUIDE ROADMAPPING  08/01/2022   IR  US GUIDE VASC ACCESS RIGHT  09/06/2020   IR US GUIDE VASC ACCESS RIGHT  08/01/2022   REVISON OF ARTERIOVENOUS FISTULA Left 05/18/2022   Procedure: REVISON OF LEFT ARTERIOVENOUS FISTULA;  Surgeon: Cherre Robins, MD;  Location: [facility] OR;  Service: Vascular;  Laterality: Left;   TUBAL LIGATION  1980    Family Hx:  Family History  Problem Relation Age of Onset   Hypertension Mother    Cancer Sister        type unknown   Cancer Brother        type unknown   Hypertension Daughter    Colon cancer Neg Hx    Colon polyps Neg Hx     Esophageal cancer Neg Hx    Stomach cancer Neg Hx    Rectal cancer Neg Hx     Social History:  reports that she has never smoked. She has never used smokeless tobacco. She reports that she does not drink alcohol and does not use drugs.  Allergies: No Known Allergies  Medications: Prior to Admission medications   Medication Sig Start Date End Date Taking? Authorizing Provider  acetaminophen (TYLENOL) 500 MG tablet Take 500 mg by mouth every 6 (six) hours as needed for moderate pain or headache.    [provider]  amLODipine (NORVASC) 10 MG tablet Take 10 mg by mouth every evening. 10/14/14   [provider]  atorvastatin (LIPITOR) 10 MG tablet Take 10 mg by mouth every evening.     [provider]  cinacalcet (SENSIPAR) 60 MG tablet Take 60 mg by mouth at bedtime.    [provider]  dexlansoprazole (DEXILANT) 60 MG capsule Take 60 mg by mouth daily.     [provider]  hydrALAZINE (APRESOLINE) 25 MG tablet Take 25 mg by mouth 3 (three) times daily.    [provider]  lidocaine-prilocaine (EMLA) cream Apply 1 application topically See admin instructions. Apply a small amount to access site (AVF) 1 to 2 hours before dialysis. Cover with occlusive dressing (saran warp). 08/27/18   [provider]  metoprolol tartrate (LOPRESSOR) 50 MG tablet Take 1 tablet (50 mg total) by mouth 2 (two) times daily. 09/13/18   Meccariello, Bernita Raisin, MD  multivitamin (RENA-VIT) TABS tablet Take 1 tablet by mouth daily.    [provider]  sevelamer carbonate (RENVELA) 800 MG tablet Take 3 tablets (2,400 mg total) by mouth 3 (three) times daily with meals. 09/13/18   Meccariello, Bernita Raisin, MD    I have reviewed the patient's current and reported prior to admission medications.  Labs:     Latest Ref Rng & Units 08/01/2022    8:52 AM 08/01/2022    4:48 AM 05/18/2022    8:03 AM  BMP  Glucose 70 - 99 mg/dL  108  103   BUN 8 - 23 mg/dL  37  43    Creatinine 0.44 - 1.00 mg/dL  6.00  7.10   Sodium 135 - 145 mmol/L  134  138   Potassium 3.5 - 5.1 mmol/L 4.7  5.3  5.3   Chloride 98 - 111 mmol/L  94  98   CO2 22 - 32 mmol/L  28    Calcium 8.9 - 10.3 mg/dL  8.6      ROS:  Pertinent items noted in HPI and remainder of comprehensive ROS otherwise negative.  Physical Exam: Vitals:   08/01/22 1230 08/01/22 1445  BP: (!) 160/62 (!) 165/78  Pulse: 64 65  Resp: 17 10  Temp:    SpO2: 95% 94%     General: elderly female in stretcher in NAD  HEENT: NCAT Eyes: EOMI sclera anicteric Neck: supple trachea midline  Heart: S1S2 no rub Lungs: clear to auscultation ;normal work of breathing on room air  Abdomen: soft/non-tender/nd Extremities: no edema appreciated; no cyanosis or clubbing Skin: no rash on extremities exposed Neuro: awake and alert; conversant  Psych: normal mood and affect Access: LUE AVF bruit and thrill   Outpatient HD orders: [facility]  3.75hrs MWF 400/500 edw 65.7kg 2K 2Ca LU AVF No heparin Venofer '100mg'$  qHD x10 No current ESA - last mircera 20mg on 06/19/22 Hectorol 323m qHD  Sensipar '60mg'$  qd  Assessment/Plan:  # Acute GI bleed - Appreciate IR and GI  - s/p gelfoam embolization  # ESRD  - HD per MWF schedule  - outpatient orders as above - her initial K was hemolyzed and K is acceptable   # HTN  - continue home regimen for now when ok for PO meds - for now all meds appear to be IV   # Anemia of acute blood loss as well as 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  Appreciate IR and GI  # Secondary hyperparathyroidism/metabolic bone disease - check phos in AM  - resume hectorol, sensipar and binders when taking PO    [facility] - [facility]   LoClaudia Desanctis/04/2023, 4:58 PM

## 2022-08-02 DIAGNOSIS — Z8719 Personal history of other diseases of the digestive system: Secondary | ICD-10-CM | POA: Diagnosis not present

## 2022-08-02 DIAGNOSIS — K5731 Diverticulosis of large intestine without perforation or abscess with bleeding: Secondary | ICD-10-CM

## 2022-08-02 LAB — COMPREHENSIVE METABOLIC PANEL
ALT: 14 U/L (ref 0–44)
AST: 22 U/L (ref 15–41)
Albumin: 3.2 g/dL — ABNORMAL LOW (ref 3.5–5.0)
Alkaline Phosphatase: 109 U/L (ref 38–126)
Anion gap: 12 (ref 5–15)
BUN: 45 mg/dL — ABNORMAL HIGH (ref 8–23)
CO2: 28 mmol/L (ref 22–32)
Calcium: 8.5 mg/dL — ABNORMAL LOW (ref 8.9–10.3)
Chloride: 97 mmol/L — ABNORMAL LOW (ref 98–111)
Creatinine, Ser: 8.33 mg/dL — ABNORMAL HIGH (ref 0.44–1.00)
GFR, Estimated: 5 mL/min — ABNORMAL LOW (ref >60)
Glucose, Bld: 83 mg/dL (ref 70–99)
Potassium: 4.8 mmol/L (ref 3.5–5.1)
Sodium: 137 mmol/L (ref 135–145)
Total Bilirubin: 0.7 mg/dL (ref 0.3–1.2)
Total Protein: 6.8 g/dL (ref 6.5–8.1)

## 2022-08-02 LAB — CBC
HCT: 28.6 % — ABNORMAL LOW (ref 36.0–46.0)
HCT: 26.3 % — ABNORMAL LOW (ref 36.0–46.0)
HCT: 26.1 % — ABNORMAL LOW (ref 36.0–46.0)
Hemoglobin: 9.3 g/dL — ABNORMAL LOW (ref 12.0–15.0)
Hemoglobin: 8.6 g/dL — ABNORMAL LOW (ref 12.0–15.0)
Hemoglobin: 8.8 g/dL — ABNORMAL LOW (ref 12.0–15.0)
MCH: 28.4 pg (ref 26.0–34.0)
MCH: 28.5 pg (ref 26.0–34.0)
MCH: 28.9 pg (ref 26.0–34.0)
MCHC: 32.5 g/dL (ref 30.0–36.0)
MCHC: 32.7 g/dL (ref 30.0–36.0)
MCHC: 33.7 g/dL (ref 30.0–36.0)
MCV: 87.2 fL (ref 80.0–100.0)
MCV: 87.1 fL (ref 80.0–100.0)
MCV: 85.6 fL (ref 80.0–100.0)
Platelets: 151 10*3/uL (ref 150–400)
Platelets: 152 10*3/uL (ref 150–400)
Platelets: 147 10*3/uL — ABNORMAL LOW (ref 150–400)
RBC: 3.28 MIL/uL — ABNORMAL LOW (ref 3.87–5.11)
RBC: 3.02 MIL/uL — ABNORMAL LOW (ref 3.87–5.11)
RBC: 3.05 MIL/uL — ABNORMAL LOW (ref 3.87–5.11)
RDW: 15.3 % (ref 11.5–15.5)
RDW: 15.3 % (ref 11.5–15.5)
RDW: 15 % (ref 11.5–15.5)
WBC: 6.7 10*3/uL (ref 4.0–10.5)
WBC: 6.6 10*3/uL (ref 4.0–10.5)
WBC: 7.3 10*3/uL (ref 4.0–10.5)
nRBC: 0 % (ref 0.0–0.2)
nRBC: 0 % (ref 0.0–0.2)
nRBC: 0 % (ref 0.0–0.2)

## 2022-08-02 LAB — PROTIME-INR
INR: 1.1 (ref 0.8–1.2)
Prothrombin Time: 13.8 seconds (ref 11.4–15.2)

## 2022-08-02 LAB — HEMOGLOBIN A1C
Hgb A1c MFr Bld: 4.7 % — ABNORMAL LOW (ref 4.8–5.6)
Mean Plasma Glucose: 88 mg/dL

## 2022-08-02 LAB — PHOSPHORUS: Phosphorus: 5.1 mg/dL — ABNORMAL HIGH (ref 2.5–4.6)

## 2022-08-02 MED ORDER — METOPROLOL TARTRATE 50 MG PO TABS
50.0000 mg | ORAL_TABLET | Freq: Two times a day (BID) | ORAL | Status: DC
  Administered 2022-08-02: 50 mg via ORAL
  Filled 2022-08-02: qty 1

## 2022-08-02 MED ORDER — SEVELAMER CARBONATE 800 MG PO TABS
2400.0000 mg | ORAL_TABLET | Freq: Three times a day (TID) | ORAL | Status: DC
  Administered 2022-08-02 – 2022-08-04 (×6): 2400 mg via ORAL
  Filled 2022-08-02 (×6): qty 3

## 2022-08-02 MED ORDER — HYDRALAZINE HCL 25 MG PO TABS: 25.0000 mg | ORAL_TABLET | Freq: Three times a day (TID) | ORAL | Status: DC

## 2022-08-02 MED ORDER — LABETALOL HCL 5 MG/ML IV SOLN
10.0000 mg | INTRAVENOUS | Status: DC | PRN
  Administered 2022-08-02: 10 mg via INTRAVENOUS
  Filled 2022-08-02: qty 4

## 2022-08-02 MED ORDER — DOXERCALCIFEROL 4 MCG/2ML IV SOLN
3.0000 ug | INTRAVENOUS | Status: DC
  Filled 2022-08-02 (×2): qty 2

## 2022-08-02 MED ORDER — LIDOCAINE-PRILOCAINE 2.5-2.5 % EX CREA: 1.0000 | TOPICAL_CREAM | CUTANEOUS | Status: DC | PRN

## 2022-08-02 MED ORDER — PENTAFLUOROPROP-TETRAFLUOROETH EX AERO: 1.0000 | INHALATION_SPRAY | CUTANEOUS | Status: DC | PRN

## 2022-08-02 MED ORDER — ATORVASTATIN CALCIUM 10 MG PO TABS
10.0000 mg | ORAL_TABLET | Freq: Every evening | ORAL | Status: DC
  Administered 2022-08-02 – 2022-08-03 (×2): 10 mg via ORAL
  Filled 2022-08-02 (×2): qty 1

## 2022-08-02 MED ORDER — HYDRALAZINE HCL 20 MG/ML IJ SOLN: 10.0000 mg | Freq: Four times a day (QID) | INTRAMUSCULAR | Status: DC | PRN

## 2022-08-02 MED ORDER — LIDOCAINE HCL (PF) 1 % IJ SOLN: 5.0000 mL | INTRAMUSCULAR | Status: DC | PRN

## 2022-08-02 MED ORDER — AMLODIPINE BESYLATE 10 MG PO TABS: 10.0000 mg | ORAL_TABLET | Freq: Every evening | ORAL | Status: DC

## 2022-08-02 MED ORDER — DARBEPOETIN ALFA 100 MCG/0.5ML IJ SOSY
100.0000 ug | PREFILLED_SYRINGE | INTRAMUSCULAR | Status: DC
  Administered 2022-08-02: 100 ug via SUBCUTANEOUS
  Filled 2022-08-02: qty 0.5

## 2022-08-02 MED ORDER — HYDRALAZINE HCL 25 MG PO TABS: 25.0000 mg | ORAL_TABLET | Freq: Four times a day (QID) | ORAL | Status: DC | PRN

## 2022-08-02 MED ORDER — CINACALCET HCL 30 MG PO TABS
60.0000 mg | ORAL_TABLET | Freq: Every day | ORAL | Status: DC
  Administered 2022-08-02 – 2022-08-03 (×2): 60 mg via ORAL
  Filled 2022-08-02 (×2): qty 2

## 2022-08-02 NOTE — Progress Notes (Signed)
POST HD TX NOTE  08/02/22 1122  Vitals  Temp 97.7 F (36.5 C)  Temp Source Oral  BP (!) 142/60  MAP (mmHg) 84  BP Location Right Arm  BP Method Automatic  Patient Position (if appropriate) Lying  Pulse Rate 76  Pulse Rate Source Monitor  ECG Heart Rate 74  Resp 16  Oxygen Therapy  SpO2 100 %  O2 Device Room Air  Pulse Oximetry Type Continuous  During Treatment Monitoring  Intra-Hemodialysis Comments (S)   (post HD tx VS check)  Post Treatment  Dialyzer Clearance Lightly streaked  Duration of HD Treatment -hour(s) (S)  2.5 hour(s) (2 hr 37 min)  Hemodialysis Intake (mL) (S)  400 mL (NS boluses)  Liters Processed 63  Fluid Removed (mL) (S)  700 mL (1128m (valume from machine) - 4050mNS boluses = 70037m Tolerated HD Treatment (S)  No (Comment) (pt had episodes of low bp throughout tx that pt couldn't tolerate)  Post-Hemodialysis Comments (S)  tx ended 53 min early d/t continued episodes of low bp that were making pt sick and she couldn't tolerate. MD was called for VO for extra 200m18m bolus and while on  the phone w/ MD the pt asked to stop tx. this was relayed to MD and MD gave VO ok to stop tx. UF goal not met, blood rinsed back. VSS. Medications Admin: none  AVG/AVF Arterial Site Held (minutes) 5 minutes  AVG/AVF Venous Site Held (minutes) 5 minutes  Fistula / Graft Left Upper arm Arteriovenous fistula  Placement Date/Time: 02/02/12 1110   Placed prior to admission: No  Orientation: Left  Access Location: Upper arm  Access Type: (c) Arteriovenous fistula  Site Condition No complications  Fistula / Graft Assessment Bruit;Thrill;Present  Drainage Description None

## 2022-08-02 NOTE — Evaluation (Signed)
Physical Therapy Evaluation Patient Details Name: Alexa Dougherty MRN: [id number] DOB: 1945/06/13 Today's Date: 08/02/2022  History of Present Illness  Pt is a 77 y/o F admitted on 08/01/22 after presenting with c/o 3 episodes of bloody stools. CTA showed active diverticular hemorrhage at the distal transverse colon. Pt underwent IR gel-foam embolization of distal transverse colonic arterial supply. PMH: ESRD on HD MWF, anemia, GERD, HTN, HLD, diverticular bleed 1 year ago  Clinical Impression  Pt seen for PT evaluation with pt's daughter [name]) present for session. [name] speaks [place] & pt speaks some English, both declining use of iPad interpreter. Prior to admission pt was independent without AD, livign with her other daughter in a 2 level home with bed/bath on 2nd level. On this date, pt is able to complete bed mobility with mod I, sit<>stand with supervision, and gait around nurses station without AD with supervision. Pt presents with slightly decreased gait speed but no overt LOB. Pt does endorse fatigue after ~70 ft of ambulation. Discussed d/c recommendation with daughter reporting pt will have extensive family support on d/c.       Recommendations for follow up therapy are one component of a multi-disciplinary discharge planning process, led by the attending physician.  Recommendations may be updated based on patient status, additional functional criteria and insurance authorization.  Follow Up Recommendations Home health PT      Assistance Recommended at Discharge Intermittent Supervision/Assistance  Patient can return home with the following  A little help with walking and/or transfers;A little help with bathing/dressing/bathroom;Assistance with cooking/housework;Assist for transportation;Help with stairs or ramp for entrance    Equipment Recommendations None recommended by PT  Recommendations for Other Services       Functional Status Assessment Patient has had a recent  decline in their functional status and demonstrates the ability to make significant improvements in function in a reasonable and predictable amount of time.     Precautions / Restrictions Precautions Precautions: Fall Restrictions Weight Bearing Restrictions: No      Mobility  Bed Mobility Overal bed mobility: Modified Independent             General bed mobility comments: supine<>sit    Transfers Overall transfer level: Needs assistance Equipment used: None Transfers: Sit to/from Stand Sit to Stand: Supervision                Ambulation/Gait Ambulation/Gait assistance: Supervision Gait Distance (Feet): 100 Feet Assistive device: None Gait Pattern/deviations: Decreased step length - right, Decreased step length - left, Decreased stride length Gait velocity: decreased     General Gait Details: Notes fatigue after ~70 ft  Stairs            Wheelchair Mobility    Modified Rankin (Stroke Patients Only)       Balance Overall balance assessment: Needs assistance Sitting-balance support: Feet supported Sitting balance-Leahy Scale: Good     Standing balance support: No upper extremity supported, During functional activity Standing balance-Leahy Scale: Fair                               Pertinent Vitals/Pain Pain Assessment Pain Assessment: No/denies pain    Home Living Family/patient expects to be discharged to:: Private residence Living Arrangements: Children (granddaughter) Available Help at Discharge: Family (family provides almost 24 hr supervision, longest pt is left home alone is ~hour/day) Type of Home: House Home Access: Stairs to enter Entrance Stairs-Rails: None Entrance Stairs-Number of Steps:  1-2 Alternate Level Stairs-Number of Steps: flight Home Layout: Two level;Bed/bath upstairs Home Equipment: Rollator (4 wheels)      Prior Function Prior Level of Function : Independent/Modified Independent              Mobility Comments: Pt ambulatory without AD, denies falls in the past 6 months.       Hand Dominance        Extremity/Trunk Assessment   Upper Extremity Assessment Upper Extremity Assessment: Generalized weakness    Lower Extremity Assessment Lower Extremity Assessment: Generalized weakness    Cervical / Trunk Assessment Cervical / Trunk Assessment: Normal  Communication   Communication: Prefers language other than English (speaks some [place], daughter in room who speaks [place] & they decline use of iPad interpreter)  Cognition Arousal/Alertness: Awake/alert Behavior During Therapy: WFL for tasks assessed/performed Overall Cognitive Status: Within Functional Limits for tasks assessed                                          General Comments      Exercises     Assessment/Plan    PT Assessment Patient needs continued PT services  PT Problem List Decreased strength;Decreased activity tolerance;Decreased balance;Decreased mobility;Decreased safety awareness;Decreased knowledge of use of DME       PT Treatment Interventions DME instruction;Gait training;Balance training;Therapeutic exercise;Stair training;Neuromuscular re-education;Functional mobility training;Therapeutic activities;Patient/family education    PT Goals (Current goals can be found in the Care Plan section)  Acute Rehab PT Goals Patient Stated Goal: get better PT Goal Formulation: With patient Time For Goal Achievement: 08/16/22 Potential to Achieve Goals: Good    Frequency Min 3X/week     Co-evaluation               AM-PAC PT "6 Clicks" Mobility  Outcome Measure Help needed turning from your back to your side while in a flat bed without using bedrails?: None Help needed moving from lying on your back to sitting on the side of a flat bed without using bedrails?: None Help needed moving to and from a bed to a chair (including a wheelchair)?: A Little Help needed standing  up from a chair using your arms (e.g., wheelchair or bedside chair)?: A Little Help needed to walk in hospital room?: A Little Help needed climbing 3-5 steps with a railing? : A Little 6 Click Score: 20    End of Session   Activity Tolerance: Patient tolerated treatment well;Patient limited by fatigue Patient left: in bed;with call bell/phone within reach;with bed alarm set;with family/visitor present   PT Visit Diagnosis: Muscle weakness (generalized) (M62.81);Unsteadiness on feet (R26.81)    Time: [id number] PT Time Calculation (min) (ACUTE ONLY): 12 min   Charges:   PT Evaluation $PT Eval Low Complexity: [place], PT, DPT 08/02/22, 2:49 PM   Waunita Schooner 08/02/2022, 2:47 PM

## 2022-08-02 NOTE — Progress Notes (Signed)
Referring Physician(s): Myles Rosenthal, MD  Supervising Physician: Sandi Mariscal  Patient Status:  [facility] - In-pt  Chief Complaint: Follow GI bleed embolization  Subjective:  Patient seen in bed, reports had small amount of red blood on toilet after BM this morning but no other bleeding noted. Denies any abdominal pain, n/v, dizziness. Had HD this morning without issue.  Allergies: Patient has no known allergies.  Medications: Prior to Admission medications   Medication Sig Start Date End Date Taking? Authorizing Provider  acetaminophen (TYLENOL) 500 MG tablet Take 500 mg by mouth every 6 (six) hours as needed for moderate pain or headache.    [provider]  amLODipine (NORVASC) 10 MG tablet Take 10 mg by mouth every evening. 10/14/14   [provider]  atorvastatin (LIPITOR) 10 MG tablet Take 10 mg by mouth every evening.     [provider]  cinacalcet (SENSIPAR) 60 MG tablet Take 60 mg by mouth at bedtime.    [provider]  dexlansoprazole (DEXILANT) 60 MG capsule Take 60 mg by mouth daily.     [provider]  hydrALAZINE (APRESOLINE) 25 MG tablet Take 25 mg by mouth 3 (three) times daily.    [provider]  lidocaine-prilocaine (EMLA) cream Apply 1 application topically See admin instructions. Apply a small amount to access site (AVF) 1 to 2 hours before dialysis. Cover with occlusive dressing (saran warp). 08/27/18   [provider]  metoprolol tartrate (LOPRESSOR) 50 MG tablet Take 1 tablet (50 mg total) by mouth 2 (two) times daily. 09/13/18   Meccariello, Bernita Raisin, MD  multivitamin (RENA-VIT) TABS tablet Take 1 tablet by mouth daily.    [provider]  sevelamer carbonate (RENVELA) 800 MG tablet Take 3 tablets (2,400 mg total) by mouth 3 (three) times daily with meals. 09/13/18   Meccariello, Bernita Raisin, MD     Vital Signs: BP (!) 151/60 (BP Location: Right Arm)   Pulse 81   Temp 97.7 F (36.5 C)  (Oral)   Resp 20   Ht 5' (1.524 m)   Wt 146 lb 2.6 oz (66.3 kg)   SpO2 100%   BMI 28.55 kg/m   Physical Exam Vitals and nursing note reviewed.  HENT:     Head: Normocephalic.  Cardiovascular:     Rate and Rhythm: Normal rate.     Comments: (+) right CFA puncture site clean, dry, dressed appropriately. Non tender. No active bleeding. Pulmonary:     Effort: Pulmonary effort is normal.  Abdominal:     Palpations: Abdomen is soft.     Tenderness: There is no abdominal tenderness.  Skin:    General: Skin is warm and dry.     Coloration: Skin is not pale.  Neurological:     Mental Status: She is alert.     Imaging: IR Angiogram Visceral Selective  Result Date: 08/01/2022 INDICATION: Lower GI bleed with rectal bleeding. Positive CTA with active contrast extravasation at the level of the distal transverse colon near the splenic flexure. 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 EXAM: 1. ULTRASOUND GUIDANCE FOR VASCULAR ACCESS OF THE RIGHT COMMON FEMORAL ARTERY 2. SELECTIVE ARTERIOGRAPHY OF THE SUPERIOR MESENTERIC ARTERY 3. ADDITIONAL SELECTIVE ARTERIOGRAPHY OF SECOND ORDER BRANCH OF SUPERIOR MESENTERIC ARTERY 4. ADDITIONAL SELECTIVE ARTERIOGRAPHY OF THIRD ORDER TRANSVERSE COLONIC BRANCH OF SUPERIOR MESENTERIC ARTERY 5. ADDITIONAL SELECTIVE ARTERIOGRAPHY OF FOURTH ORDER SUPPLY TO THE TRANSVERSE  COLON 6. TRANSCATHETER EMBOLIZATION OF ARTERIAL SUPPLY TO THE TRANSVERSE COLON TO TREAT HEMORRHAGE MEDICATIONS: NONE ANESTHESIA/SEDATION: Moderate (conscious) sedation was employed during this procedure. A total of Versed 2.0 mg and Fentanyl 100 mcg was administered intravenously. Moderate Sedation Time: 83 minutes. The patient's level of consciousness and vital signs were monitored continuously by radiology nursing throughout the procedure under my direct supervision. CONTRAST:  50 ML OMNIPAQUE 300 FLUOROSCOPY  TIME:  Radiation Exposure Index (as provided by the fluoroscopic device): 99991111 mGy Kerma COMPLICATIONS: None immediate. PROCEDURE: Informed consent was obtained from the patient and her daughter following explanation of the procedure, risks, benefits and alternatives. The patient's daughter understands, agrees and consents for the procedure. All questions were addressed. A time out was performed prior to the initiation of the procedure. Maximal barrier sterile technique utilized including caps, mask, sterile gowns, sterile gloves, large sterile drape, hand hygiene, and chlorhexidine prep. During the procedure local anesthesia was provided with 1% lidocaine. Ultrasound was used to confirm patency of the right common femoral artery. A permanent ultrasound image was saved and recorded. Access of the right common femoral artery was performed under direct ultrasound guidance with a micropuncture set. After obtaining arterial access, a 5 [place] vascular sheath was advanced over a guidewire. A 5 French Cobra catheter was advanced into the abdominal aorta. This was used to selectively catheterize the superior mesenteric artery. Selective arteriography was performed of the SMA. The 5 French catheter was further advanced into the SMA trunk. A Progreat Alpha microcatheter was advanced through the 5 French catheter and into a second order branch supplying the transverse colon. Selective arteriography was performed in this branch via the microcatheter. The microcatheter was then further advanced into a left-sided branch of the transverse colonic artery supplying the mid to distal transverse colon. Selective arteriography was performed via the microcatheter. The microcatheter was further advanced into an additional branch of this artery and selective arteriography performed. The microcatheter was then further advanced into a further additional branch of the artery and selective arteriography performed. Attempt was made to further  advance the microcatheter in this branch over a guidewire. The microcatheter was then retracted and additional arteriography performed. A slurry of Gel-Foam pledgets was then made and diluted in saline and contrast. Embolization was then performed through the microcatheter at the level of distal transverse colonic arterial supply with diluted Gel-Foam pledgets. Additional arteriography was performed through the microcatheter. Catheters were removed. Fluoroscopic injection of contrast was performed at the level of the arteriotomy via the 5 French sheath to assess level of femoral puncture and patency of the artery prior to closure. Arteriotomy closure was performed using a 6 [place] Angio-Seal device. FINDINGS: The superior mesenteric artery is normally patent. One of the early right-sided branches of the SMA trunk supplies the transverse colon and demonstrates an early bifurcation into proximal and distal branches. This was catheterized and selective arteriography performed. Further selective arteriography was then performed of the left sided branch of this trunk that supplies the mid to distal transverse colon. Selective arteriography further distally in this trunk demonstrates active contrast extravasation from a focal inferior branch along the mesenteric aspect of the distal transverse colon near the splenic flexure. This is in the region of contrast extravasation seen by CT angiography. Attempt to further selectively catheterize the arcade along the mesenteric aspect of the colon at this level to place embolization coils was difficult due to tortuosity and small size of the vessels. Initially, a microcatheter was able to be advanced  into the proximal aspect of the arcade but this did result in dissection of the artery with occlusion and the microcatheter had to be retracted. After initial occlusion, the vessel did spontaneously open back up after a few minutes which was confirmed by arteriography. As  sub-selective coil embolization was not possible, decision was made to proceed with Gel-Foam embolization of the distal transverse arterial supply in order to treat the arterial hemorrhage. This resulted in slow flow and cessation of visualization of smaller distal branch vessels. No further active contrast extravasation was seen after embolization. IMPRESSION: 1. Active contrast extravasation in the region of contrast extravasation seen by CT angiography at the level of the distal transverse colon near the splenic flexure from distal arterial supply of the transverse colonic arcade. 2. Gel-Foam embolization was performed of distal transverse colonic arterial supply as coil embolization could not be performed due to dissection of an arterial branch supplying the region of focal bleeding along the mesenteric aspect of distal transverse colonic arterial supply. The dissection did eventually open back up but sub selective coil embolization was not possible. Electronically Signed   By: Aletta Edouard M.D.   On: 08/01/2022 12:04   IR US Guide Vasc Access Right  Result Date: 08/01/2022 INDICATION: Lower GI bleed with rectal bleeding. Positive CTA with active contrast extravasation at the level of the distal transverse colon near the splenic flexure. 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 EXAM: 1. ULTRASOUND GUIDANCE FOR VASCULAR ACCESS OF THE RIGHT COMMON FEMORAL ARTERY 2. SELECTIVE ARTERIOGRAPHY OF THE SUPERIOR MESENTERIC ARTERY 3. ADDITIONAL SELECTIVE ARTERIOGRAPHY OF SECOND ORDER BRANCH OF SUPERIOR MESENTERIC ARTERY 4. ADDITIONAL SELECTIVE ARTERIOGRAPHY OF THIRD ORDER TRANSVERSE COLONIC BRANCH OF SUPERIOR MESENTERIC ARTERY 5. ADDITIONAL SELECTIVE ARTERIOGRAPHY OF FOURTH ORDER SUPPLY TO THE TRANSVERSE COLON 6. TRANSCATHETER EMBOLIZATION OF ARTERIAL SUPPLY TO THE TRANSVERSE COLON TO TREAT HEMORRHAGE MEDICATIONS: NONE  ANESTHESIA/SEDATION: Moderate (conscious) sedation was employed during this procedure. A total of Versed 2.0 mg and Fentanyl 100 mcg was administered intravenously. Moderate Sedation Time: 83 minutes. The patient's level of consciousness and vital signs were monitored continuously by radiology nursing throughout the procedure under my direct supervision. CONTRAST:  50 ML OMNIPAQUE 300 FLUOROSCOPY TIME:  Radiation Exposure Index (as provided by the fluoroscopic device): 99991111 mGy Kerma COMPLICATIONS: None immediate. PROCEDURE: Informed consent was obtained from the patient and her daughter following explanation of the procedure, risks, benefits and alternatives. The patient's daughter understands, agrees and consents for the procedure. All questions were addressed. A time out was performed prior to the initiation of the procedure. Maximal barrier sterile technique utilized including caps, mask, sterile gowns, sterile gloves, large sterile drape, hand hygiene, and chlorhexidine prep. During the procedure local anesthesia was provided with 1% lidocaine. Ultrasound was used to confirm patency of the right common femoral artery. A permanent ultrasound image was saved and recorded. Access of the right common femoral artery was performed under direct ultrasound guidance with a micropuncture set. After obtaining arterial access, a 5 [place] vascular sheath was advanced over a guidewire. A 5 French Cobra catheter was advanced into the abdominal aorta. This was used to selectively catheterize the superior mesenteric artery. Selective arteriography was performed of the SMA. The 5 French catheter was further advanced into the SMA trunk. A Progreat Alpha microcatheter was advanced through the 5 French catheter and into a second order branch supplying the transverse colon. Selective arteriography was performed in this  branch via the microcatheter. The microcatheter was then further advanced into a left-sided branch of the transverse  colonic artery supplying the mid to distal transverse colon. Selective arteriography was performed via the microcatheter. The microcatheter was further advanced into an additional branch of this artery and selective arteriography performed. The microcatheter was then further advanced into a further additional branch of the artery and selective arteriography performed. Attempt was made to further advance the microcatheter in this branch over a guidewire. The microcatheter was then retracted and additional arteriography performed. A slurry of Gel-Foam pledgets was then made and diluted in saline and contrast. Embolization was then performed through the microcatheter at the level of distal transverse colonic arterial supply with diluted Gel-Foam pledgets. Additional arteriography was performed through the microcatheter. Catheters were removed. Fluoroscopic injection of contrast was performed at the level of the arteriotomy via the 5 French sheath to assess level of femoral puncture and patency of the artery prior to closure. Arteriotomy closure was performed using a 6 [place] Angio-Seal device. FINDINGS: The superior mesenteric artery is normally patent. One of the early right-sided branches of the SMA trunk supplies the transverse colon and demonstrates an early bifurcation into proximal and distal branches. This was catheterized and selective arteriography performed. Further selective arteriography was then performed of the left sided branch of this trunk that supplies the mid to distal transverse colon. Selective arteriography further distally in this trunk demonstrates active contrast extravasation from a focal inferior branch along the mesenteric aspect of the distal transverse colon near the splenic flexure. This is in the region of contrast extravasation seen by CT angiography. Attempt to further selectively catheterize the arcade along the mesenteric aspect of the colon at this level to place embolization coils  was difficult due to tortuosity and small size of the vessels. Initially, a microcatheter was able to be advanced into the proximal aspect of the arcade but this did result in dissection of the artery with occlusion and the microcatheter had to be retracted. After initial occlusion, the vessel did spontaneously open back up after a few minutes which was confirmed by arteriography. As sub-selective coil embolization was not possible, decision was made to proceed with Gel-Foam embolization of the distal transverse arterial supply in order to treat the arterial hemorrhage. This resulted in slow flow and cessation of visualization of smaller distal branch vessels. No further active contrast extravasation was seen after embolization. IMPRESSION: 1. Active contrast extravasation in the region of contrast extravasation seen by CT angiography at the level of the distal transverse colon near the splenic flexure from distal arterial supply of the transverse colonic arcade. 2. Gel-Foam embolization was performed of distal transverse colonic arterial supply as coil embolization could not be performed due to dissection of an arterial branch supplying the region of focal bleeding along the mesenteric aspect of distal transverse colonic arterial supply. The dissection did eventually open back up but sub selective coil embolization was not possible. Electronically Signed   By: Aletta Edouard M.D.   On: 08/01/2022 12:04   IR Angiogram Selective Each Additional Vessel  Result Date: 08/01/2022 INDICATION: Lower GI bleed with rectal bleeding. Positive CTA with active contrast extravasation at the level of the distal transverse colon near the splenic flexure. 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 EXAM: 1. ULTRASOUND GUIDANCE FOR VASCULAR ACCESS OF THE RIGHT COMMON FEMORAL ARTERY 2. SELECTIVE ARTERIOGRAPHY OF THE SUPERIOR MESENTERIC  ARTERY  3. ADDITIONAL SELECTIVE ARTERIOGRAPHY OF SECOND ORDER BRANCH OF SUPERIOR MESENTERIC ARTERY 4. ADDITIONAL SELECTIVE ARTERIOGRAPHY OF THIRD ORDER TRANSVERSE COLONIC BRANCH OF SUPERIOR MESENTERIC ARTERY 5. ADDITIONAL SELECTIVE ARTERIOGRAPHY OF FOURTH ORDER SUPPLY TO THE TRANSVERSE COLON 6. TRANSCATHETER EMBOLIZATION OF ARTERIAL SUPPLY TO THE TRANSVERSE COLON TO TREAT HEMORRHAGE MEDICATIONS: NONE ANESTHESIA/SEDATION: Moderate (conscious) sedation was employed during this procedure. A total of Versed 2.0 mg and Fentanyl 100 mcg was administered intravenously. Moderate Sedation Time: 83 minutes. The patient's level of consciousness and vital signs were monitored continuously by radiology nursing throughout the procedure under my direct supervision. CONTRAST:  50 ML OMNIPAQUE 300 FLUOROSCOPY TIME:  Radiation Exposure Index (as provided by the fluoroscopic device): 99991111 mGy Kerma COMPLICATIONS: None immediate. PROCEDURE: Informed consent was obtained from the patient and her daughter following explanation of the procedure, risks, benefits and alternatives. The patient's daughter understands, agrees and consents for the procedure. All questions were addressed. A time out was performed prior to the initiation of the procedure. Maximal barrier sterile technique utilized including caps, mask, sterile gowns, sterile gloves, large sterile drape, hand hygiene, and chlorhexidine prep. During the procedure local anesthesia was provided with 1% lidocaine. Ultrasound was used to confirm patency of the right common femoral artery. A permanent ultrasound image was saved and recorded. Access of the right common femoral artery was performed under direct ultrasound guidance with a micropuncture set. After obtaining arterial access, a 5 [place] vascular sheath was advanced over a guidewire. A 5 French Cobra catheter was advanced into the abdominal aorta. This was used to selectively catheterize the superior mesenteric artery. Selective  arteriography was performed of the SMA. The 5 French catheter was further advanced into the SMA trunk. A Progreat Alpha microcatheter was advanced through the 5 French catheter and into a second order branch supplying the transverse colon. Selective arteriography was performed in this branch via the microcatheter. The microcatheter was then further advanced into a left-sided branch of the transverse colonic artery supplying the mid to distal transverse colon. Selective arteriography was performed via the microcatheter. The microcatheter was further advanced into an additional branch of this artery and selective arteriography performed. The microcatheter was then further advanced into a further additional branch of the artery and selective arteriography performed. Attempt was made to further advance the microcatheter in this branch over a guidewire. The microcatheter was then retracted and additional arteriography performed. A slurry of Gel-Foam pledgets was then made and diluted in saline and contrast. Embolization was then performed through the microcatheter at the level of distal transverse colonic arterial supply with diluted Gel-Foam pledgets. Additional arteriography was performed through the microcatheter. Catheters were removed. Fluoroscopic injection of contrast was performed at the level of the arteriotomy via the 5 French sheath to assess level of femoral puncture and patency of the artery prior to closure. Arteriotomy closure was performed using a 6 [place] Angio-Seal device. FINDINGS: The superior mesenteric artery is normally patent. One of the early right-sided branches of the SMA trunk supplies the transverse colon and demonstrates an early bifurcation into proximal and distal branches. This was catheterized and selective arteriography performed. Further selective arteriography was then performed of the left sided branch of this trunk that supplies the mid to distal transverse colon. Selective  arteriography further distally in this trunk demonstrates active contrast extravasation from a focal inferior branch along the mesenteric aspect of the distal transverse colon near the splenic flexure. This is in the region of contrast extravasation seen by CT angiography. Attempt to further  selectively catheterize the arcade along the mesenteric aspect of the colon at this level to place embolization coils was difficult due to tortuosity and small size of the vessels. Initially, a microcatheter was able to be advanced into the proximal aspect of the arcade but this did result in dissection of the artery with occlusion and the microcatheter had to be retracted. After initial occlusion, the vessel did spontaneously open back up after a few minutes which was confirmed by arteriography. As sub-selective coil embolization was not possible, decision was made to proceed with Gel-Foam embolization of the distal transverse arterial supply in order to treat the arterial hemorrhage. This resulted in slow flow and cessation of visualization of smaller distal branch vessels. No further active contrast extravasation was seen after embolization. IMPRESSION: 1. Active contrast extravasation in the region of contrast extravasation seen by CT angiography at the level of the distal transverse colon near the splenic flexure from distal arterial supply of the transverse colonic arcade. 2. Gel-Foam embolization was performed of distal transverse colonic arterial supply as coil embolization could not be performed due to dissection of an arterial branch supplying the region of focal bleeding along the mesenteric aspect of distal transverse colonic arterial supply. The dissection did eventually open back up but sub selective coil embolization was not possible. Electronically Signed   By: Aletta Edouard M.D.   On: 08/01/2022 12:04   IR Angiogram Selective Each Additional Vessel  Result Date: 08/01/2022 INDICATION: Lower GI bleed with  rectal bleeding. Positive CTA with active contrast extravasation at the level of the distal transverse colon near the splenic flexure. 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 EXAM: 1. ULTRASOUND GUIDANCE FOR VASCULAR ACCESS OF THE RIGHT COMMON FEMORAL ARTERY 2. SELECTIVE ARTERIOGRAPHY OF THE SUPERIOR MESENTERIC ARTERY 3. ADDITIONAL SELECTIVE ARTERIOGRAPHY OF SECOND ORDER BRANCH OF SUPERIOR MESENTERIC ARTERY 4. ADDITIONAL SELECTIVE ARTERIOGRAPHY OF THIRD ORDER TRANSVERSE COLONIC BRANCH OF SUPERIOR MESENTERIC ARTERY 5. ADDITIONAL SELECTIVE ARTERIOGRAPHY OF FOURTH ORDER SUPPLY TO THE TRANSVERSE COLON 6. TRANSCATHETER EMBOLIZATION OF ARTERIAL SUPPLY TO THE TRANSVERSE COLON TO TREAT HEMORRHAGE MEDICATIONS: NONE ANESTHESIA/SEDATION: Moderate (conscious) sedation was employed during this procedure. A total of Versed 2.0 mg and Fentanyl 100 mcg was administered intravenously. Moderate Sedation Time: 83 minutes. The patient's level of consciousness and vital signs were monitored continuously by radiology nursing throughout the procedure under my direct supervision. CONTRAST:  50 ML OMNIPAQUE 300 FLUOROSCOPY TIME:  Radiation Exposure Index (as provided by the fluoroscopic device): 99991111 mGy Kerma COMPLICATIONS: None immediate. PROCEDURE: Informed consent was obtained from the patient and her daughter following explanation of the procedure, risks, benefits and alternatives. The patient's daughter understands, agrees and consents for the procedure. All questions were addressed. A time out was performed prior to the initiation of the procedure. Maximal barrier sterile technique utilized including caps, mask, sterile gowns, sterile gloves, large sterile drape, hand hygiene, and chlorhexidine prep. During the procedure local anesthesia was provided with 1% lidocaine. Ultrasound was used to confirm patency of the right common femoral  artery. A permanent ultrasound image was saved and recorded. Access of the right common femoral artery was performed under direct ultrasound guidance with a micropuncture set. After obtaining arterial access, a 5 [place] vascular sheath was advanced over a guidewire. A 5 French Cobra catheter was advanced into the abdominal aorta. This was used to selectively catheterize the superior mesenteric artery. Selective arteriography was performed of the SMA. The  5 French catheter was further advanced into the SMA trunk. A Progreat Alpha microcatheter was advanced through the 5 French catheter and into a second order branch supplying the transverse colon. Selective arteriography was performed in this branch via the microcatheter. The microcatheter was then further advanced into a left-sided branch of the transverse colonic artery supplying the mid to distal transverse colon. Selective arteriography was performed via the microcatheter. The microcatheter was further advanced into an additional branch of this artery and selective arteriography performed. The microcatheter was then further advanced into a further additional branch of the artery and selective arteriography performed. Attempt was made to further advance the microcatheter in this branch over a guidewire. The microcatheter was then retracted and additional arteriography performed. A slurry of Gel-Foam pledgets was then made and diluted in saline and contrast. Embolization was then performed through the microcatheter at the level of distal transverse colonic arterial supply with diluted Gel-Foam pledgets. Additional arteriography was performed through the microcatheter. Catheters were removed. Fluoroscopic injection of contrast was performed at the level of the arteriotomy via the 5 French sheath to assess level of femoral puncture and patency of the artery prior to closure. Arteriotomy closure was performed using a 6 [place] Angio-Seal device. FINDINGS: The superior  mesenteric artery is normally patent. One of the early right-sided branches of the SMA trunk supplies the transverse colon and demonstrates an early bifurcation into proximal and distal branches. This was catheterized and selective arteriography performed. Further selective arteriography was then performed of the left sided branch of this trunk that supplies the mid to distal transverse colon. Selective arteriography further distally in this trunk demonstrates active contrast extravasation from a focal inferior branch along the mesenteric aspect of the distal transverse colon near the splenic flexure. This is in the region of contrast extravasation seen by CT angiography. Attempt to further selectively catheterize the arcade along the mesenteric aspect of the colon at this level to place embolization coils was difficult due to tortuosity and small size of the vessels. Initially, a microcatheter was able to be advanced into the proximal aspect of the arcade but this did result in dissection of the artery with occlusion and the microcatheter had to be retracted. After initial occlusion, the vessel did spontaneously open back up after a few minutes which was confirmed by arteriography. As sub-selective coil embolization was not possible, decision was made to proceed with Gel-Foam embolization of the distal transverse arterial supply in order to treat the arterial hemorrhage. This resulted in slow flow and cessation of visualization of smaller distal branch vessels. No further active contrast extravasation was seen after embolization. IMPRESSION: 1. Active contrast extravasation in the region of contrast extravasation seen by CT angiography at the level of the distal transverse colon near the splenic flexure from distal arterial supply of the transverse colonic arcade. 2. Gel-Foam embolization was performed of distal transverse colonic arterial supply as coil embolization could not be performed due to dissection of an  arterial branch supplying the region of focal bleeding along the mesenteric aspect of distal transverse colonic arterial supply. The dissection did eventually open back up but sub selective coil embolization was not possible. Electronically Signed   By: Aletta Edouard M.D.   On: 08/01/2022 12:04   IR Angiogram Selective Each Additional Vessel  Result Date: 08/01/2022 INDICATION: Lower GI bleed with rectal bleeding. Positive CTA with active contrast extravasation at the level of the distal transverse colon near the splenic flexure. History of prior transcatheter embolization for  diverticular bleed at the level of the ascending colon near the hepatic flexure in 2022. EXAM: 1. ULTRASOUND GUIDANCE FOR VASCULAR ACCESS OF THE RIGHT COMMON FEMORAL ARTERY 2. SELECTIVE ARTERIOGRAPHY OF THE SUPERIOR MESENTERIC ARTERY 3. ADDITIONAL SELECTIVE ARTERIOGRAPHY OF SECOND ORDER BRANCH OF SUPERIOR MESENTERIC ARTERY 4. ADDITIONAL SELECTIVE ARTERIOGRAPHY OF THIRD ORDER TRANSVERSE COLONIC BRANCH OF SUPERIOR MESENTERIC ARTERY 5. ADDITIONAL SELECTIVE ARTERIOGRAPHY OF FOURTH ORDER SUPPLY TO THE TRANSVERSE COLON 6. TRANSCATHETER EMBOLIZATION OF ARTERIAL SUPPLY TO THE TRANSVERSE COLON TO TREAT HEMORRHAGE MEDICATIONS: NONE ANESTHESIA/SEDATION: Moderate (conscious) sedation was employed during this procedure. A total of Versed 2.0 mg and Fentanyl 100 mcg was administered intravenously. Moderate Sedation Time: 83 minutes. The patient's level of consciousness and vital signs were monitored continuously by radiology nursing throughout the procedure under my direct supervision. CONTRAST:  50 ML OMNIPAQUE 300 FLUOROSCOPY TIME:  Radiation Exposure Index (as provided by the fluoroscopic device): 99991111 mGy Kerma COMPLICATIONS: None immediate. PROCEDURE: Informed consent was obtained from the patient and her daughter following explanation of the procedure, risks, benefits and alternatives. The patient's daughter understands, agrees and consents for  the procedure. All questions were addressed. A time out was performed prior to the initiation of the procedure. Maximal barrier sterile technique utilized including caps, mask, sterile gowns, sterile gloves, large sterile drape, hand hygiene, and chlorhexidine prep. During the procedure local anesthesia was provided with 1% lidocaine. Ultrasound was used to confirm patency of the right common femoral artery. A permanent ultrasound image was saved and recorded. Access of the right common femoral artery was performed under direct ultrasound guidance with a micropuncture set. After obtaining arterial access, a 5 [place] vascular sheath was advanced over a guidewire. A 5 French Cobra catheter was advanced into the abdominal aorta. This was used to selectively catheterize the superior mesenteric artery. Selective arteriography was performed of the SMA. The 5 French catheter was further advanced into the SMA trunk. A Progreat Alpha microcatheter was advanced through the 5 French catheter and into a second order branch supplying the transverse colon. Selective arteriography was performed in this branch via the microcatheter. The microcatheter was then further advanced into a left-sided branch of the transverse colonic artery supplying the mid to distal transverse colon. Selective arteriography was performed via the microcatheter. The microcatheter was further advanced into an additional branch of this artery and selective arteriography performed. The microcatheter was then further advanced into a further additional branch of the artery and selective arteriography performed. Attempt was made to further advance the microcatheter in this branch over a guidewire. The microcatheter was then retracted and additional arteriography performed. A slurry of Gel-Foam pledgets was then made and diluted in saline and contrast. Embolization was then performed through the microcatheter at the level of distal transverse colonic arterial  supply with diluted Gel-Foam pledgets. Additional arteriography was performed through the microcatheter. Catheters were removed. Fluoroscopic injection of contrast was performed at the level of the arteriotomy via the 5 French sheath to assess level of femoral puncture and patency of the artery prior to closure. Arteriotomy closure was performed using a 6 [place] Angio-Seal device. FINDINGS: The superior mesenteric artery is normally patent. One of the early right-sided branches of the SMA trunk supplies the transverse colon and demonstrates an early bifurcation into proximal and distal branches. This was catheterized and selective arteriography performed. Further selective arteriography was then performed of the left sided branch of this trunk that supplies the mid to distal transverse colon. Selective arteriography further distally in this trunk demonstrates  active contrast extravasation from a focal inferior branch along the mesenteric aspect of the distal transverse colon near the splenic flexure. This is in the region of contrast extravasation seen by CT angiography. Attempt to further selectively catheterize the arcade along the mesenteric aspect of the colon at this level to place embolization coils was difficult due to tortuosity and small size of the vessels. Initially, a microcatheter was able to be advanced into the proximal aspect of the arcade but this did result in dissection of the artery with occlusion and the microcatheter had to be retracted. After initial occlusion, the vessel did spontaneously open back up after a few minutes which was confirmed by arteriography. As sub-selective coil embolization was not possible, decision was made to proceed with Gel-Foam embolization of the distal transverse arterial supply in order to treat the arterial hemorrhage. This resulted in slow flow and cessation of visualization of smaller distal branch vessels. No further active contrast extravasation was seen after  embolization. IMPRESSION: 1. Active contrast extravasation in the region of contrast extravasation seen by CT angiography at the level of the distal transverse colon near the splenic flexure from distal arterial supply of the transverse colonic arcade. 2. Gel-Foam embolization was performed of distal transverse colonic arterial supply as coil embolization could not be performed due to dissection of an arterial branch supplying the region of focal bleeding along the mesenteric aspect of distal transverse colonic arterial supply. The dissection did eventually open back up but sub selective coil embolization was not possible. Electronically Signed   By: Aletta Edouard M.D.   On: 08/01/2022 12:04   IR EMBO ART  VEN HEMORR LYMPH EXTRAV  INC GUIDE ROADMAPPING  Result Date: 08/01/2022 INDICATION: Lower GI bleed with rectal bleeding. Positive CTA with active contrast extravasation at the level of the distal transverse colon near the splenic flexure. 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 EXAM: 1. ULTRASOUND GUIDANCE FOR VASCULAR ACCESS OF THE RIGHT COMMON FEMORAL ARTERY 2. SELECTIVE ARTERIOGRAPHY OF THE SUPERIOR MESENTERIC ARTERY 3. ADDITIONAL SELECTIVE ARTERIOGRAPHY OF SECOND ORDER BRANCH OF SUPERIOR MESENTERIC ARTERY 4. ADDITIONAL SELECTIVE ARTERIOGRAPHY OF THIRD ORDER TRANSVERSE COLONIC BRANCH OF SUPERIOR MESENTERIC ARTERY 5. ADDITIONAL SELECTIVE ARTERIOGRAPHY OF FOURTH ORDER SUPPLY TO THE TRANSVERSE COLON 6. TRANSCATHETER EMBOLIZATION OF ARTERIAL SUPPLY TO THE TRANSVERSE COLON TO TREAT HEMORRHAGE MEDICATIONS: NONE ANESTHESIA/SEDATION: Moderate (conscious) sedation was employed during this procedure. A total of Versed 2.0 mg and Fentanyl 100 mcg was administered intravenously. Moderate Sedation Time: 83 minutes. The patient's level of consciousness and vital signs were monitored continuously by radiology nursing  throughout the procedure under my direct supervision. CONTRAST:  50 ML OMNIPAQUE 300 FLUOROSCOPY TIME:  Radiation Exposure Index (as provided by the fluoroscopic device): 99991111 mGy Kerma COMPLICATIONS: None immediate. PROCEDURE: Informed consent was obtained from the patient and her daughter following explanation of the procedure, risks, benefits and alternatives. The patient's daughter understands, agrees and consents for the procedure. All questions were addressed. A time out was performed prior to the initiation of the procedure. Maximal barrier sterile technique utilized including caps, mask, sterile gowns, sterile gloves, large sterile drape, hand hygiene, and chlorhexidine prep. During the procedure local anesthesia was provided with 1% lidocaine. Ultrasound was used to confirm patency of the right common femoral artery. A permanent ultrasound image was saved and recorded. Access of the right common femoral artery was performed under direct ultrasound guidance with a micropuncture set. After  obtaining arterial access, a 5 [place] vascular sheath was advanced over a guidewire. A 5 French Cobra catheter was advanced into the abdominal aorta. This was used to selectively catheterize the superior mesenteric artery. Selective arteriography was performed of the SMA. The 5 French catheter was further advanced into the SMA trunk. A Progreat Alpha microcatheter was advanced through the 5 French catheter and into a second order branch supplying the transverse colon. Selective arteriography was performed in this branch via the microcatheter. The microcatheter was then further advanced into a left-sided branch of the transverse colonic artery supplying the mid to distal transverse colon. Selective arteriography was performed via the microcatheter. The microcatheter was further advanced into an additional branch of this artery and selective arteriography performed. The microcatheter was then further advanced into a further  additional branch of the artery and selective arteriography performed. Attempt was made to further advance the microcatheter in this branch over a guidewire. The microcatheter was then retracted and additional arteriography performed. A slurry of Gel-Foam pledgets was then made and diluted in saline and contrast. Embolization was then performed through the microcatheter at the level of distal transverse colonic arterial supply with diluted Gel-Foam pledgets. Additional arteriography was performed through the microcatheter. Catheters were removed. Fluoroscopic injection of contrast was performed at the level of the arteriotomy via the 5 French sheath to assess level of femoral puncture and patency of the artery prior to closure. Arteriotomy closure was performed using a 6 [place] Angio-Seal device. FINDINGS: The superior mesenteric artery is normally patent. One of the early right-sided branches of the SMA trunk supplies the transverse colon and demonstrates an early bifurcation into proximal and distal branches. This was catheterized and selective arteriography performed. Further selective arteriography was then performed of the left sided branch of this trunk that supplies the mid to distal transverse colon. Selective arteriography further distally in this trunk demonstrates active contrast extravasation from a focal inferior branch along the mesenteric aspect of the distal transverse colon near the splenic flexure. This is in the region of contrast extravasation seen by CT angiography. Attempt to further selectively catheterize the arcade along the mesenteric aspect of the colon at this level to place embolization coils was difficult due to tortuosity and small size of the vessels. Initially, a microcatheter was able to be advanced into the proximal aspect of the arcade but this did result in dissection of the artery with occlusion and the microcatheter had to be retracted. After initial occlusion, the vessel did  spontaneously open back up after a few minutes which was confirmed by arteriography. As sub-selective coil embolization was not possible, decision was made to proceed with Gel-Foam embolization of the distal transverse arterial supply in order to treat the arterial hemorrhage. This resulted in slow flow and cessation of visualization of smaller distal branch vessels. No further active contrast extravasation was seen after embolization. IMPRESSION: 1. Active contrast extravasation in the region of contrast extravasation seen by CT angiography at the level of the distal transverse colon near the splenic flexure from distal arterial supply of the transverse colonic arcade. 2. Gel-Foam embolization was performed of distal transverse colonic arterial supply as coil embolization could not be performed due to dissection of an arterial branch supplying the region of focal bleeding along the mesenteric aspect of distal transverse colonic arterial supply. The dissection did eventually open back up but sub selective coil embolization was not possible. Electronically Signed   By: Aletta Edouard M.D.   On: 08/01/2022 12:04  CT ANGIO GI BLEED  Result Date: 08/01/2022 CLINICAL DATA:  Mid abdominal pain. Bright red rectal bleeding. End-stage renal disease. EXAM: CTA ABDOMEN AND PELVIS WITHOUT AND WITH CONTRAST TECHNIQUE: Multidetector CT imaging of the abdomen and pelvis was performed using the standard protocol during bolus administration of intravenous contrast. Multiplanar reconstructed images and MIPs were obtained and reviewed to evaluate the vascular anatomy. RADIATION DOSE REDUCTION: This exam was performed according to the departmental dose-optimization program which includes automated exposure control, adjustment of the mA and/or kV according to patient size and/or use of iterative reconstruction technique. CONTRAST:  17m OMNIPAQUE IOHEXOL 350 MG/ML SOLN COMPARISON:  09/07/2020 FINDINGS: VASCULAR Aorta: Atheromatous  wall thickening and calcification of the aorta. No aneurysm or dissection Celiac: Atheromatous plaque at the origin accentuated by median arcuate ligament based on morphology, but without critical stenosis. SMA: Moderate origin stenosis due to calcified plaque with mild poststenotic dilatation. No acute finding. Renals: Symmetrically enhancing renal arteries without high-grade proximal stenosis. IMA: Patent Inflow: Extensive atheromatous calcification. Proximal Outflow: Atheromatous plaque with up to 50% narrowing at the right common iliac artery. Veins: Unremarkable Review of the MIP images confirms the above findings. NON-VASCULAR Lower chest: Cardiomegaly and atherosclerosis. Hazy appearance of markings at the lung bases, favor scarring. Hepatobiliary: Some surface lobulation and caudate lobe enlargement suggested, cirrhosis is conceivable but not definite.Cholelithiasis. No acute inflammation or biliary obstruction seen. Pancreas: Unremarkable. Spleen: Unremarkable. Adrenals/Urinary Tract: Negative adrenals. No hydronephrosis or stone. Severe bilateral renal atrophy in keeping with end-stage renal disease history. Simple bilateral renal cysts measuring up to 11 mm on the right, no follow-up imaging recommended. Unremarkable bladder. Stomach/Bowel: Diverticular colon. Actively intraluminal hemorrhage is seen at a distal transverse diverticulum along the inferior wall, site marked on 6:95. No bowel wall thickening or obstruction. No appendicitis. Lymphatic: Negative for mass or adenopathy Reproductive:Unremarkable for age Other: No ascites or pneumoperitoneum. Musculoskeletal: Extensive spinal degeneration. ER attending made aware via epic chat. IMPRESSION: 1. 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 2. Atherosclerosis with ~ 50% narrowing at the SMA and right common iliac arteries. 3. Additional chronic findings are described above. Electronically Signed   By: JJorje GuildM.D.   On:  08/01/2022 07:28    Labs:  CBC: Recent Labs    03/13/22 1115 05/18/22 0803 08/01/22 0605 08/01/22 0852 08/01/22 1255 08/02/22 0312 08/02/22 0842  WBC 9.9  --  7.6  --   --  6.7 6.6  HGB 10.6*   < > 10.3* 9.7* 8.8* 9.3* 8.6*  HCT 33.7*   < > 32.9* 31.0* 28.3* 28.6* 26.3*  PLT 204  --  166  --   --  151 152   < > = values in this interval not displayed.    COAGS: Recent Labs    08/01/22 0605 08/02/22 0312  INR 1.0 1.1    BMP: Recent Labs    08/16/21 0150 03/13/22 1115 05/18/22 0803 08/01/22 0448 08/01/22 0852 08/02/22 0312  NA 138 139 138 134*  --  137  K 4.5 3.9 5.3* 5.3* 4.7 4.8  CL 96* 92* 98 94*  --  97*  CO2 30 30  --  28  --  28  GLUCOSE 91 102* 103* 108*  --  83  BUN 32* 20 43* 37*  --  45*  CALCIUM 8.6* 8.6*  --  8.6*  --  8.5*  CREATININE 6.12* 4.17* 7.10* 6.00*  --  8.33*  GFRNONAA 7* 10*  --  7*  --  5*    LIVER FUNCTION TESTS: Recent Labs    08/15/21 1020 03/13/22 1115 08/01/22 0448 08/02/22 0312  BILITOT 0.7 0.6 0.9 0.7  AST 20 31 42* 22  ALT '15 20 18 14  '$ ALKPHOS 140* 134* 121 109  PROT 7.1 8.2* 8.3* 6.8  ALBUMIN 3.1* 3.5 3.9 3.2*    Assessment and Plan:  77 y/o F with admitted with hematochezia found to have GI bleed s/p distal transverse colonic arterial supply embolization 3/12 with Dr. Kathlene Cote seen today for follow up.  Patient reports BM with small amount of red blood on toilet paper, denies any other complaints. BP has been stable (hypertensive near her baseline), hgb with slight downtrend (8.6 > 9.3 post IR > 8.8 > 9.7 > 10.3 on admission), plt stable.   Right CFA puncture site unremarkable - may remove dressing today and shower. Do not submerge x 7 days.  No further IR needs at this time, plans per primary team. IR remains available as needed - please call with questions or concerns.  Electronically Signed: Joaquim Nam, PA-C 08/02/2022, 1:48 PM   I spent a total of 15 Minutes at the the patient's bedside AND on  the patient's hospital floor or unit, greater than 50% of which was counseling/coordinating care for GI bleed.

## 2022-08-02 NOTE — Progress Notes (Signed)
Patient to hemo and hemo called to update on patient's blood pressure being elevated during the night. No blood pressure obtained before patient to hemo this am.

## 2022-08-02 NOTE — Progress Notes (Addendum)
[facility] Progress Note  Name: Alexa Dougherty MRN: [id number] DOB: [birth year]  Chief Complaint:  Blood per rectum   Subjective:  Seen and examined on dialysis.  Procedure supervised.  Blood pressure 160/90 and HR 77.  Tolerating goal.  Left AVF in use.  She states that she had a BM this morning and had just a little blood on the toilet paper.     Review of systems:  Denies shortness of breath or chest pain  Denies n/v She states she hasn't eaten.  She's thirsty   --------- Background on consult:  Alexa Dougherty is a 77 y.o. female with a history of ESRD on HD, HTN, and gastritis who presented to [place] with bright red blood per rectum.  GI was consulted.  She underwent gelfoam embolization earlier today with IR; per IR note no further active bleeding after embolization.  Nephrology was consulted.  The patient is to be transferred to [facility] as she is ESRD.  Note that she had a diverticular bleed a year ago which required IR intervention per charting.  She speaks [place] and this is not offered on the video interpreter service so she asked if she could call her daughter.  She is feeling better but tired and hungry.  She states that she had a BM after the procedure and that now she feels less urgency; she wasn't able to see if there was blood in it.     No intake or output data in the 24 hours ending 08/02/22 0839  Vitals:  Vitals:   08/02/22 0439 08/02/22 0440 08/02/22 0700 08/02/22 0830  BP:    (!) 153/76  Pulse: 87 86 81 72  Resp: '18 19 14 11  '$ Temp:      TempSrc:      SpO2: 97% 96% 96% 99%  Weight:      Height:         Physical Exam:  General adult female in bed in no acute distress HEENT normocephalic atraumatic extraocular movements intact sclera anicteric Neck supple trachea midline Lungs clear to auscultation bilaterally normal work of breathing at rest  Heart S1S2 no rub Abdomen soft nontender nondistended Extremities no edema  Psych normal mood  and affect Access LUE AVF in use   Medications reviewed   Labs:     Latest Ref Rng & Units 08/02/2022    3:12 AM 08/01/2022    8:52 AM 08/01/2022    4:48 AM  BMP  Glucose 70 - 99 mg/dL 83   108   BUN 8 - 23 mg/dL 45   37   Creatinine 0.44 - 1.00 mg/dL 8.33   6.00   Sodium 135 - 145 mmol/L 137   134   Potassium 3.5 - 5.1 mmol/L 4.8  4.7  5.3   Chloride 98 - 111 mmol/L 97   94   CO2 22 - 32 mmol/L 28   28   Calcium 8.9 - 10.3 mg/dL 8.5   8.6    Outpatient HD orders: [facility]  3.75hrs MWF 400/500 edw 65.7kg 2K 2Ca LU AVF No heparin Venofer '100mg'$  qHD x10 No current ESA - last mircera 53mg on 06/19/22 Hectorol 332m qHD   Sensipar '60mg'$  qd   Assessment/Plan:   # Acute GI bleed - Appreciate IR and GI  - s/p gelfoam embolization - now is ordered a clear diet per chart review   # ESRD  - HD per MWF schedule  - outpatient orders as above   #  HTN  - continue home regimen    # Anemia of acute blood loss as well as 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  Appreciate IR and GI   # Secondary hyperparathyroidism/metabolic bone disease - hyperphosphatemia - resume renvela  - resume hectorol, sensipar    Disposition - per primary team   Claudia Desanctis, MD 08/02/2022 8:56 AM   Start ESA - Kyra Searles 100 mcg every weeks   Claudia Desanctis, MD 9:04 AM 08/02/2022

## 2022-08-02 NOTE — Progress Notes (Signed)
OT Cancellation Note  Patient Details Name: [name] MRN: [id number] DOB: [birth year]   Cancelled Treatment:    Reason Eval/Treat Not Completed: Patient at procedure or test/ unavailable (HD)  Elliot Cousin 08/02/2022, 8:06 AM

## 2022-08-02 NOTE — Progress Notes (Addendum)
[facility] Progress Note  CC:  Hematochezia    Subjective:  Had a BM today with just a little bit of blood on the toilet paper early this morning.  No abdominal pain.  Had dialysis this AM.  Daughter at bedside.  Objective:  Vital signs in last 24 hours: Temp:  [97.4 F (36.3 C)-98.9 F (37.2 C)] 97.7 F (36.5 C) (03/13 1200) Pulse Rate:  [63-96] 81 (03/13 1200) Resp:  [8-38] 20 (03/13 1200) BP: (71-215)/(44-89) 151/60 (03/13 1200) SpO2:  [84 %-100 %] 100 % (03/13 1200) Weight:  [66.3 kg-70.2 kg] 66.3 kg (03/13 1122) Last BM Date : 08/01/22 General:  Alert, Well-developed, in NAD Heart:  Regular rate and rhythm; no murmurs Pulm:  CTAB.  No W/R/R. Abdomen:  Soft, non-distended.  BS present.  Non-tender. Extremities:  Without edema. Neurologic:  Alert and oriented x 4;  grossly normal neurologically. Psych:  Alert and cooperative. Normal mood and affect.  Intake/Output this shift: Total I/O In: 0  Out: 700 [Other:700]  Lab Results: Recent Labs    08/01/22 0605 08/01/22 0852 08/01/22 1255 08/02/22 0312 08/02/22 0842  WBC 7.6  --   --  6.7 6.6  HGB 10.3*   < > 8.8* 9.3* 8.6*  HCT 32.9*   < > 28.3* 28.6* 26.3*  PLT 166  --   --  151 152   < > = values in this interval not displayed.   BMET Recent Labs    08/01/22 0448 08/01/22 0852 08/02/22 0312  NA 134*  --  137  K 5.3* 4.7 4.8  CL 94*  --  97*  CO2 28  --  28  GLUCOSE 108*  --  83  BUN 37*  --  45*  CREATININE 6.00*  --  8.33*  CALCIUM 8.6*  --  8.5*   LFT Recent Labs    08/02/22 0312  PROT 6.8  ALBUMIN 3.2*  AST 22  ALT 14  ALKPHOS 109  BILITOT 0.7   PT/INR Recent Labs    08/01/22 0605 08/02/22 0312  LABPROT 12.9 13.8  INR 1.0 1.1   Hepatitis Panel Recent Labs    08/01/22 1844  HEPBSAG NON REACTIVE    IR Angiogram Visceral Selective  Result Date: 08/01/2022 INDICATION: Lower GI bleed with rectal bleeding. Positive CTA with active contrast extravasation at the  level of the distal transverse colon near the splenic flexure. 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 EXAM: 1. ULTRASOUND GUIDANCE FOR VASCULAR ACCESS OF THE RIGHT COMMON FEMORAL ARTERY 2. SELECTIVE ARTERIOGRAPHY OF THE SUPERIOR MESENTERIC ARTERY 3. ADDITIONAL SELECTIVE ARTERIOGRAPHY OF SECOND ORDER BRANCH OF SUPERIOR MESENTERIC ARTERY 4. ADDITIONAL SELECTIVE ARTERIOGRAPHY OF THIRD ORDER TRANSVERSE COLONIC BRANCH OF SUPERIOR MESENTERIC ARTERY 5. ADDITIONAL SELECTIVE ARTERIOGRAPHY OF FOURTH ORDER SUPPLY TO THE TRANSVERSE COLON 6. TRANSCATHETER EMBOLIZATION OF ARTERIAL SUPPLY TO THE TRANSVERSE COLON TO TREAT HEMORRHAGE MEDICATIONS: NONE ANESTHESIA/SEDATION: Moderate (conscious) sedation was employed during this procedure. A total of Versed 2.0 mg and Fentanyl 100 mcg was administered intravenously. Moderate Sedation Time: 83 minutes. The patient's level of consciousness and vital signs were monitored continuously by radiology nursing throughout the procedure under my direct supervision. CONTRAST:  50 ML OMNIPAQUE 300 FLUOROSCOPY TIME:  Radiation Exposure Index (as provided by the fluoroscopic device): 99991111 mGy Kerma COMPLICATIONS: None immediate. PROCEDURE: Informed consent was obtained from the patient and her daughter following explanation of the procedure, risks,  benefits and alternatives. The patient's daughter understands, agrees and consents for the procedure. All questions were addressed. A time out was performed prior to the initiation of the procedure. Maximal barrier sterile technique utilized including caps, mask, sterile gowns, sterile gloves, large sterile drape, hand hygiene, and chlorhexidine prep. During the procedure local anesthesia was provided with 1% lidocaine. Ultrasound was used to confirm patency of the right common femoral artery. A permanent ultrasound image was saved and recorded. Access of the  right common femoral artery was performed under direct ultrasound guidance with a micropuncture set. After obtaining arterial access, a 5 [place] vascular sheath was advanced over a guidewire. A 5 French Cobra catheter was advanced into the abdominal aorta. This was used to selectively catheterize the superior mesenteric artery. Selective arteriography was performed of the SMA. The 5 French catheter was further advanced into the SMA trunk. A Progreat Alpha microcatheter was advanced through the 5 French catheter and into a second order branch supplying the transverse colon. Selective arteriography was performed in this branch via the microcatheter. The microcatheter was then further advanced into a left-sided branch of the transverse colonic artery supplying the mid to distal transverse colon. Selective arteriography was performed via the microcatheter. The microcatheter was further advanced into an additional branch of this artery and selective arteriography performed. The microcatheter was then further advanced into a further additional branch of the artery and selective arteriography performed. Attempt was made to further advance the microcatheter in this branch over a guidewire. The microcatheter was then retracted and additional arteriography performed. A slurry of Gel-Foam pledgets was then made and diluted in saline and contrast. Embolization was then performed through the microcatheter at the level of distal transverse colonic arterial supply with diluted Gel-Foam pledgets. Additional arteriography was performed through the microcatheter. Catheters were removed. Fluoroscopic injection of contrast was performed at the level of the arteriotomy via the 5 French sheath to assess level of femoral puncture and patency of the artery prior to closure. Arteriotomy closure was performed using a 6 [place] Angio-Seal device. FINDINGS: The superior mesenteric artery is normally patent. One of the early right-sided branches  of the SMA trunk supplies the transverse colon and demonstrates an early bifurcation into proximal and distal branches. This was catheterized and selective arteriography performed. Further selective arteriography was then performed of the left sided branch of this trunk that supplies the mid to distal transverse colon. Selective arteriography further distally in this trunk demonstrates active contrast extravasation from a focal inferior branch along the mesenteric aspect of the distal transverse colon near the splenic flexure. This is in the region of contrast extravasation seen by CT angiography. Attempt to further selectively catheterize the arcade along the mesenteric aspect of the colon at this level to place embolization coils was difficult due to tortuosity and small size of the vessels. Initially, a microcatheter was able to be advanced into the proximal aspect of the arcade but this did result in dissection of the artery with occlusion and the microcatheter had to be retracted. After initial occlusion, the vessel did spontaneously open back up after a few minutes which was confirmed by arteriography. As sub-selective coil embolization was not possible, decision was made to proceed with Gel-Foam embolization of the distal transverse arterial supply in order to treat the arterial hemorrhage. This resulted in slow flow and cessation of visualization of smaller distal branch vessels. No further active contrast extravasation was seen after embolization. IMPRESSION: 1. Active contrast extravasation in the region of contrast  extravasation seen by CT angiography at the level of the distal transverse colon near the splenic flexure from distal arterial supply of the transverse colonic arcade. 2. Gel-Foam embolization was performed of distal transverse colonic arterial supply as coil embolization could not be performed due to dissection of an arterial branch supplying the region of focal bleeding along the mesenteric  aspect of distal transverse colonic arterial supply. The dissection did eventually open back up but sub selective coil embolization was not possible. Electronically Signed   By: Aletta Edouard M.D.   On: 08/01/2022 12:04   IR US Guide Vasc Access Right  Result Date: 08/01/2022 INDICATION: Lower GI bleed with rectal bleeding. Positive CTA with active contrast extravasation at the level of the distal transverse colon near the splenic flexure. 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 EXAM: 1. ULTRASOUND GUIDANCE FOR VASCULAR ACCESS OF THE RIGHT COMMON FEMORAL ARTERY 2. SELECTIVE ARTERIOGRAPHY OF THE SUPERIOR MESENTERIC ARTERY 3. ADDITIONAL SELECTIVE ARTERIOGRAPHY OF SECOND ORDER BRANCH OF SUPERIOR MESENTERIC ARTERY 4. ADDITIONAL SELECTIVE ARTERIOGRAPHY OF THIRD ORDER TRANSVERSE COLONIC BRANCH OF SUPERIOR MESENTERIC ARTERY 5. ADDITIONAL SELECTIVE ARTERIOGRAPHY OF FOURTH ORDER SUPPLY TO THE TRANSVERSE COLON 6. TRANSCATHETER EMBOLIZATION OF ARTERIAL SUPPLY TO THE TRANSVERSE COLON TO TREAT HEMORRHAGE MEDICATIONS: NONE ANESTHESIA/SEDATION: Moderate (conscious) sedation was employed during this procedure. A total of Versed 2.0 mg and Fentanyl 100 mcg was administered intravenously. Moderate Sedation Time: 83 minutes. The patient's level of consciousness and vital signs were monitored continuously by radiology nursing throughout the procedure under my direct supervision. CONTRAST:  50 ML OMNIPAQUE 300 FLUOROSCOPY TIME:  Radiation Exposure Index (as provided by the fluoroscopic device): 99991111 mGy Kerma COMPLICATIONS: None immediate. PROCEDURE: Informed consent was obtained from the patient and her daughter following explanation of the procedure, risks, benefits and alternatives. The patient's daughter understands, agrees and consents for the procedure. All questions were addressed. A time out was performed prior to the initiation  of the procedure. Maximal barrier sterile technique utilized including caps, mask, sterile gowns, sterile gloves, large sterile drape, hand hygiene, and chlorhexidine prep. During the procedure local anesthesia was provided with 1% lidocaine. Ultrasound was used to confirm patency of the right common femoral artery. A permanent ultrasound image was saved and recorded. Access of the right common femoral artery was performed under direct ultrasound guidance with a micropuncture set. After obtaining arterial access, a 5 [place] vascular sheath was advanced over a guidewire. A 5 French Cobra catheter was advanced into the abdominal aorta. This was used to selectively catheterize the superior mesenteric artery. Selective arteriography was performed of the SMA. The 5 French catheter was further advanced into the SMA trunk. A Progreat Alpha microcatheter was advanced through the 5 French catheter and into a second order branch supplying the transverse colon. Selective arteriography was performed in this branch via the microcatheter. The microcatheter was then further advanced into a left-sided branch of the transverse colonic artery supplying the mid to distal transverse colon. Selective arteriography was performed via the microcatheter. The microcatheter was further advanced into an additional branch of this artery and selective arteriography performed. The microcatheter was then further advanced into a further additional branch of the artery and selective arteriography performed. Attempt was made to further advance the microcatheter in this branch over a guidewire. The microcatheter was then retracted and additional arteriography performed. A slurry of Gel-Foam pledgets was then made and diluted in saline and contrast. Embolization  was then performed through the microcatheter at the level of distal transverse colonic arterial supply with diluted Gel-Foam pledgets. Additional arteriography was performed through the  microcatheter. Catheters were removed. Fluoroscopic injection of contrast was performed at the level of the arteriotomy via the 5 French sheath to assess level of femoral puncture and patency of the artery prior to closure. Arteriotomy closure was performed using a 6 [place] Angio-Seal device. FINDINGS: The superior mesenteric artery is normally patent. One of the early right-sided branches of the SMA trunk supplies the transverse colon and demonstrates an early bifurcation into proximal and distal branches. This was catheterized and selective arteriography performed. Further selective arteriography was then performed of the left sided branch of this trunk that supplies the mid to distal transverse colon. Selective arteriography further distally in this trunk demonstrates active contrast extravasation from a focal inferior branch along the mesenteric aspect of the distal transverse colon near the splenic flexure. This is in the region of contrast extravasation seen by CT angiography. Attempt to further selectively catheterize the arcade along the mesenteric aspect of the colon at this level to place embolization coils was difficult due to tortuosity and small size of the vessels. Initially, a microcatheter was able to be advanced into the proximal aspect of the arcade but this did result in dissection of the artery with occlusion and the microcatheter had to be retracted. After initial occlusion, the vessel did spontaneously open back up after a few minutes which was confirmed by arteriography. As sub-selective coil embolization was not possible, decision was made to proceed with Gel-Foam embolization of the distal transverse arterial supply in order to treat the arterial hemorrhage. This resulted in slow flow and cessation of visualization of smaller distal branch vessels. No further active contrast extravasation was seen after embolization. IMPRESSION: 1. Active contrast extravasation in the region of contrast  extravasation seen by CT angiography at the level of the distal transverse colon near the splenic flexure from distal arterial supply of the transverse colonic arcade. 2. Gel-Foam embolization was performed of distal transverse colonic arterial supply as coil embolization could not be performed due to dissection of an arterial branch supplying the region of focal bleeding along the mesenteric aspect of distal transverse colonic arterial supply. The dissection did eventually open back up but sub selective coil embolization was not possible. Electronically Signed   By: Aletta Edouard M.D.   On: 08/01/2022 12:04   IR Angiogram Selective Each Additional Vessel  Result Date: 08/01/2022 INDICATION: Lower GI bleed with rectal bleeding. Positive CTA with active contrast extravasation at the level of the distal transverse colon near the splenic flexure. 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 EXAM: 1. ULTRASOUND GUIDANCE FOR VASCULAR ACCESS OF THE RIGHT COMMON FEMORAL ARTERY 2. SELECTIVE ARTERIOGRAPHY OF THE SUPERIOR MESENTERIC ARTERY 3. ADDITIONAL SELECTIVE ARTERIOGRAPHY OF SECOND ORDER BRANCH OF SUPERIOR MESENTERIC ARTERY 4. ADDITIONAL SELECTIVE ARTERIOGRAPHY OF THIRD ORDER TRANSVERSE COLONIC BRANCH OF SUPERIOR MESENTERIC ARTERY 5. ADDITIONAL SELECTIVE ARTERIOGRAPHY OF FOURTH ORDER SUPPLY TO THE TRANSVERSE COLON 6. TRANSCATHETER EMBOLIZATION OF ARTERIAL SUPPLY TO THE TRANSVERSE COLON TO TREAT HEMORRHAGE MEDICATIONS: NONE ANESTHESIA/SEDATION: Moderate (conscious) sedation was employed during this procedure. A total of Versed 2.0 mg and Fentanyl 100 mcg was administered intravenously. Moderate Sedation Time: 83 minutes. The patient's level of consciousness and vital signs were monitored continuously by radiology nursing throughout the procedure under my direct supervision. CONTRAST:  50 ML OMNIPAQUE  300 FLUOROSCOPY TIME:   Radiation Exposure Index (as provided by the fluoroscopic device): 99991111 mGy Kerma COMPLICATIONS: None immediate. PROCEDURE: Informed consent was obtained from the patient and her daughter following explanation of the procedure, risks, benefits and alternatives. The patient's daughter understands, agrees and consents for the procedure. All questions were addressed. A time out was performed prior to the initiation of the procedure. Maximal barrier sterile technique utilized including caps, mask, sterile gowns, sterile gloves, large sterile drape, hand hygiene, and chlorhexidine prep. During the procedure local anesthesia was provided with 1% lidocaine. Ultrasound was used to confirm patency of the right common femoral artery. A permanent ultrasound image was saved and recorded. Access of the right common femoral artery was performed under direct ultrasound guidance with a micropuncture set. After obtaining arterial access, a 5 [place] vascular sheath was advanced over a guidewire. A 5 French Cobra catheter was advanced into the abdominal aorta. This was used to selectively catheterize the superior mesenteric artery. Selective arteriography was performed of the SMA. The 5 French catheter was further advanced into the SMA trunk. A Progreat Alpha microcatheter was advanced through the 5 French catheter and into a second order branch supplying the transverse colon. Selective arteriography was performed in this branch via the microcatheter. The microcatheter was then further advanced into a left-sided branch of the transverse colonic artery supplying the mid to distal transverse colon. Selective arteriography was performed via the microcatheter. The microcatheter was further advanced into an additional branch of this artery and selective arteriography performed. The microcatheter was then further advanced into a further additional branch of the artery and selective arteriography performed. Attempt was made to further advance  the microcatheter in this branch over a guidewire. The microcatheter was then retracted and additional arteriography performed. A slurry of Gel-Foam pledgets was then made and diluted in saline and contrast. Embolization was then performed through the microcatheter at the level of distal transverse colonic arterial supply with diluted Gel-Foam pledgets. Additional arteriography was performed through the microcatheter. Catheters were removed. Fluoroscopic injection of contrast was performed at the level of the arteriotomy via the 5 French sheath to assess level of femoral puncture and patency of the artery prior to closure. Arteriotomy closure was performed using a 6 [place] Angio-Seal device. FINDINGS: The superior mesenteric artery is normally patent. One of the early right-sided branches of the SMA trunk supplies the transverse colon and demonstrates an early bifurcation into proximal and distal branches. This was catheterized and selective arteriography performed. Further selective arteriography was then performed of the left sided branch of this trunk that supplies the mid to distal transverse colon. Selective arteriography further distally in this trunk demonstrates active contrast extravasation from a focal inferior branch along the mesenteric aspect of the distal transverse colon near the splenic flexure. This is in the region of contrast extravasation seen by CT angiography. Attempt to further selectively catheterize the arcade along the mesenteric aspect of the colon at this level to place embolization coils was difficult due to tortuosity and small size of the vessels. Initially, a microcatheter was able to be advanced into the proximal aspect of the arcade but this did result in dissection of the artery with occlusion and the microcatheter had to be retracted. After initial occlusion, the vessel did spontaneously open back up after a few minutes which was confirmed by arteriography. As sub-selective coil  embolization was not possible, decision was made to proceed with Gel-Foam embolization of the distal transverse arterial supply in order to  treat the arterial hemorrhage. This resulted in slow flow and cessation of visualization of smaller distal branch vessels. No further active contrast extravasation was seen after embolization. IMPRESSION: 1. Active contrast extravasation in the region of contrast extravasation seen by CT angiography at the level of the distal transverse colon near the splenic flexure from distal arterial supply of the transverse colonic arcade. 2. Gel-Foam embolization was performed of distal transverse colonic arterial supply as coil embolization could not be performed due to dissection of an arterial branch supplying the region of focal bleeding along the mesenteric aspect of distal transverse colonic arterial supply. The dissection did eventually open back up but sub selective coil embolization was not possible. Electronically Signed   By: Aletta Edouard M.D.   On: 08/01/2022 12:04   IR Angiogram Selective Each Additional Vessel  Result Date: 08/01/2022 INDICATION: Lower GI bleed with rectal bleeding. Positive CTA with active contrast extravasation at the level of the distal transverse colon near the splenic flexure. 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 EXAM: 1. ULTRASOUND GUIDANCE FOR VASCULAR ACCESS OF THE RIGHT COMMON FEMORAL ARTERY 2. SELECTIVE ARTERIOGRAPHY OF THE SUPERIOR MESENTERIC ARTERY 3. ADDITIONAL SELECTIVE ARTERIOGRAPHY OF SECOND ORDER BRANCH OF SUPERIOR MESENTERIC ARTERY 4. ADDITIONAL SELECTIVE ARTERIOGRAPHY OF THIRD ORDER TRANSVERSE COLONIC BRANCH OF SUPERIOR MESENTERIC ARTERY 5. ADDITIONAL SELECTIVE ARTERIOGRAPHY OF FOURTH ORDER SUPPLY TO THE TRANSVERSE COLON 6. TRANSCATHETER EMBOLIZATION OF ARTERIAL SUPPLY TO THE TRANSVERSE COLON TO TREAT HEMORRHAGE MEDICATIONS: NONE  ANESTHESIA/SEDATION: Moderate (conscious) sedation was employed during this procedure. A total of Versed 2.0 mg and Fentanyl 100 mcg was administered intravenously. Moderate Sedation Time: 83 minutes. The patient's level of consciousness and vital signs were monitored continuously by radiology nursing throughout the procedure under my direct supervision. CONTRAST:  50 ML OMNIPAQUE 300 FLUOROSCOPY TIME:  Radiation Exposure Index (as provided by the fluoroscopic device): 99991111 mGy Kerma COMPLICATIONS: None immediate. PROCEDURE: Informed consent was obtained from the patient and her daughter following explanation of the procedure, risks, benefits and alternatives. The patient's daughter understands, agrees and consents for the procedure. All questions were addressed. A time out was performed prior to the initiation of the procedure. Maximal barrier sterile technique utilized including caps, mask, sterile gowns, sterile gloves, large sterile drape, hand hygiene, and chlorhexidine prep. During the procedure local anesthesia was provided with 1% lidocaine. Ultrasound was used to confirm patency of the right common femoral artery. A permanent ultrasound image was saved and recorded. Access of the right common femoral artery was performed under direct ultrasound guidance with a micropuncture set. After obtaining arterial access, a 5 [place] vascular sheath was advanced over a guidewire. A 5 French Cobra catheter was advanced into the abdominal aorta. This was used to selectively catheterize the superior mesenteric artery. Selective arteriography was performed of the SMA. The 5 French catheter was further advanced into the SMA trunk. A Progreat Alpha microcatheter was advanced through the 5 French catheter and into a second order branch supplying the transverse colon. Selective arteriography was performed in this branch via the microcatheter. The microcatheter was then further advanced into a left-sided branch of the transverse  colonic artery supplying the mid to distal transverse colon. Selective arteriography was performed via the microcatheter. The microcatheter was further advanced into an additional branch of this artery and selective arteriography performed. The microcatheter was then further advanced into a further additional branch of the artery and selective arteriography performed. Attempt  was made to further advance the microcatheter in this branch over a guidewire. The microcatheter was then retracted and additional arteriography performed. A slurry of Gel-Foam pledgets was then made and diluted in saline and contrast. Embolization was then performed through the microcatheter at the level of distal transverse colonic arterial supply with diluted Gel-Foam pledgets. Additional arteriography was performed through the microcatheter. Catheters were removed. Fluoroscopic injection of contrast was performed at the level of the arteriotomy via the 5 French sheath to assess level of femoral puncture and patency of the artery prior to closure. Arteriotomy closure was performed using a 6 [place] Angio-Seal device. FINDINGS: The superior mesenteric artery is normally patent. One of the early right-sided branches of the SMA trunk supplies the transverse colon and demonstrates an early bifurcation into proximal and distal branches. This was catheterized and selective arteriography performed. Further selective arteriography was then performed of the left sided branch of this trunk that supplies the mid to distal transverse colon. Selective arteriography further distally in this trunk demonstrates active contrast extravasation from a focal inferior branch along the mesenteric aspect of the distal transverse colon near the splenic flexure. This is in the region of contrast extravasation seen by CT angiography. Attempt to further selectively catheterize the arcade along the mesenteric aspect of the colon at this level to place embolization coils  was difficult due to tortuosity and small size of the vessels. Initially, a microcatheter was able to be advanced into the proximal aspect of the arcade but this did result in dissection of the artery with occlusion and the microcatheter had to be retracted. After initial occlusion, the vessel did spontaneously open back up after a few minutes which was confirmed by arteriography. As sub-selective coil embolization was not possible, decision was made to proceed with Gel-Foam embolization of the distal transverse arterial supply in order to treat the arterial hemorrhage. This resulted in slow flow and cessation of visualization of smaller distal branch vessels. No further active contrast extravasation was seen after embolization. IMPRESSION: 1. Active contrast extravasation in the region of contrast extravasation seen by CT angiography at the level of the distal transverse colon near the splenic flexure from distal arterial supply of the transverse colonic arcade. 2. Gel-Foam embolization was performed of distal transverse colonic arterial supply as coil embolization could not be performed due to dissection of an arterial branch supplying the region of focal bleeding along the mesenteric aspect of distal transverse colonic arterial supply. The dissection did eventually open back up but sub selective coil embolization was not possible. Electronically Signed   By: Aletta Edouard M.D.   On: 08/01/2022 12:04   IR Angiogram Selective Each Additional Vessel  Result Date: 08/01/2022 INDICATION: Lower GI bleed with rectal bleeding. Positive CTA with active contrast extravasation at the level of the distal transverse colon near the splenic flexure. 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 EXAM: 1. ULTRASOUND GUIDANCE FOR VASCULAR ACCESS OF THE RIGHT COMMON FEMORAL ARTERY 2. SELECTIVE ARTERIOGRAPHY OF THE SUPERIOR MESENTERIC  ARTERY 3. ADDITIONAL SELECTIVE ARTERIOGRAPHY OF SECOND ORDER BRANCH OF SUPERIOR MESENTERIC ARTERY 4. ADDITIONAL SELECTIVE ARTERIOGRAPHY OF THIRD ORDER TRANSVERSE COLONIC BRANCH OF SUPERIOR MESENTERIC ARTERY 5. ADDITIONAL SELECTIVE ARTERIOGRAPHY OF FOURTH ORDER SUPPLY TO THE TRANSVERSE COLON 6. TRANSCATHETER EMBOLIZATION OF ARTERIAL SUPPLY TO THE TRANSVERSE COLON TO TREAT HEMORRHAGE MEDICATIONS: NONE ANESTHESIA/SEDATION: Moderate (conscious) sedation was employed during this procedure. A total of Versed 2.0 mg and  Fentanyl 100 mcg was administered intravenously. Moderate Sedation Time: 83 minutes. The patient's level of consciousness and vital signs were monitored continuously by radiology nursing throughout the procedure under my direct supervision. CONTRAST:  50 ML OMNIPAQUE 300 FLUOROSCOPY TIME:  Radiation Exposure Index (as provided by the fluoroscopic device): 99991111 mGy Kerma COMPLICATIONS: None immediate. PROCEDURE: Informed consent was obtained from the patient and her daughter following explanation of the procedure, risks, benefits and alternatives. The patient's daughter understands, agrees and consents for the procedure. All questions were addressed. A time out was performed prior to the initiation of the procedure. Maximal barrier sterile technique utilized including caps, mask, sterile gowns, sterile gloves, large sterile drape, hand hygiene, and chlorhexidine prep. During the procedure local anesthesia was provided with 1% lidocaine. Ultrasound was used to confirm patency of the right common femoral artery. A permanent ultrasound image was saved and recorded. Access of the right common femoral artery was performed under direct ultrasound guidance with a micropuncture set. After obtaining arterial access, a 5 [place] vascular sheath was advanced over a guidewire. A 5 French Cobra catheter was advanced into the abdominal aorta. This was used to selectively catheterize the superior mesenteric artery. Selective  arteriography was performed of the SMA. The 5 French catheter was further advanced into the SMA trunk. A Progreat Alpha microcatheter was advanced through the 5 French catheter and into a second order branch supplying the transverse colon. Selective arteriography was performed in this branch via the microcatheter. The microcatheter was then further advanced into a left-sided branch of the transverse colonic artery supplying the mid to distal transverse colon. Selective arteriography was performed via the microcatheter. The microcatheter was further advanced into an additional branch of this artery and selective arteriography performed. The microcatheter was then further advanced into a further additional branch of the artery and selective arteriography performed. Attempt was made to further advance the microcatheter in this branch over a guidewire. The microcatheter was then retracted and additional arteriography performed. A slurry of Gel-Foam pledgets was then made and diluted in saline and contrast. Embolization was then performed through the microcatheter at the level of distal transverse colonic arterial supply with diluted Gel-Foam pledgets. Additional arteriography was performed through the microcatheter. Catheters were removed. Fluoroscopic injection of contrast was performed at the level of the arteriotomy via the 5 French sheath to assess level of femoral puncture and patency of the artery prior to closure. Arteriotomy closure was performed using a 6 [place] Angio-Seal device. FINDINGS: The superior mesenteric artery is normally patent. One of the early right-sided branches of the SMA trunk supplies the transverse colon and demonstrates an early bifurcation into proximal and distal branches. This was catheterized and selective arteriography performed. Further selective arteriography was then performed of the left sided branch of this trunk that supplies the mid to distal transverse colon. Selective  arteriography further distally in this trunk demonstrates active contrast extravasation from a focal inferior branch along the mesenteric aspect of the distal transverse colon near the splenic flexure. This is in the region of contrast extravasation seen by CT angiography. Attempt to further selectively catheterize the arcade along the mesenteric aspect of the colon at this level to place embolization coils was difficult due to tortuosity and small size of the vessels. Initially, a microcatheter was able to be advanced into the proximal aspect of the arcade but this did result in dissection of the artery with occlusion and the microcatheter had to be retracted. After initial occlusion, the vessel did spontaneously  open back up after a few minutes which was confirmed by arteriography. As sub-selective coil embolization was not possible, decision was made to proceed with Gel-Foam embolization of the distal transverse arterial supply in order to treat the arterial hemorrhage. This resulted in slow flow and cessation of visualization of smaller distal branch vessels. No further active contrast extravasation was seen after embolization. IMPRESSION: 1. Active contrast extravasation in the region of contrast extravasation seen by CT angiography at the level of the distal transverse colon near the splenic flexure from distal arterial supply of the transverse colonic arcade. 2. Gel-Foam embolization was performed of distal transverse colonic arterial supply as coil embolization could not be performed due to dissection of an arterial branch supplying the region of focal bleeding along the mesenteric aspect of distal transverse colonic arterial supply. The dissection did eventually open back up but sub selective coil embolization was not possible. Electronically Signed   By: Aletta Edouard M.D.   On: 08/01/2022 12:04   IR EMBO ART  VEN HEMORR LYMPH EXTRAV  INC GUIDE ROADMAPPING  Result Date: 08/01/2022 INDICATION: Lower  GI bleed with rectal bleeding. Positive CTA with active contrast extravasation at the level of the distal transverse colon near the splenic flexure. 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 EXAM: 1. ULTRASOUND GUIDANCE FOR VASCULAR ACCESS OF THE RIGHT COMMON FEMORAL ARTERY 2. SELECTIVE ARTERIOGRAPHY OF THE SUPERIOR MESENTERIC ARTERY 3. ADDITIONAL SELECTIVE ARTERIOGRAPHY OF SECOND ORDER BRANCH OF SUPERIOR MESENTERIC ARTERY 4. ADDITIONAL SELECTIVE ARTERIOGRAPHY OF THIRD ORDER TRANSVERSE COLONIC BRANCH OF SUPERIOR MESENTERIC ARTERY 5. ADDITIONAL SELECTIVE ARTERIOGRAPHY OF FOURTH ORDER SUPPLY TO THE TRANSVERSE COLON 6. TRANSCATHETER EMBOLIZATION OF ARTERIAL SUPPLY TO THE TRANSVERSE COLON TO TREAT HEMORRHAGE MEDICATIONS: NONE ANESTHESIA/SEDATION: Moderate (conscious) sedation was employed during this procedure. A total of Versed 2.0 mg and Fentanyl 100 mcg was administered intravenously. Moderate Sedation Time: 83 minutes. The patient's level of consciousness and vital signs were monitored continuously by radiology nursing throughout the procedure under my direct supervision. CONTRAST:  50 ML OMNIPAQUE 300 FLUOROSCOPY TIME:  Radiation Exposure Index (as provided by the fluoroscopic device): 99991111 mGy Kerma COMPLICATIONS: None immediate. PROCEDURE: Informed consent was obtained from the patient and her daughter following explanation of the procedure, risks, benefits and alternatives. The patient's daughter understands, agrees and consents for the procedure. All questions were addressed. A time out was performed prior to the initiation of the procedure. Maximal barrier sterile technique utilized including caps, mask, sterile gowns, sterile gloves, large sterile drape, hand hygiene, and chlorhexidine prep. During the procedure local anesthesia was provided with 1% lidocaine. Ultrasound was used to confirm patency of the right  common femoral artery. A permanent ultrasound image was saved and recorded. Access of the right common femoral artery was performed under direct ultrasound guidance with a micropuncture set. After obtaining arterial access, a 5 [place] vascular sheath was advanced over a guidewire. A 5 French Cobra catheter was advanced into the abdominal aorta. This was used to selectively catheterize the superior mesenteric artery. Selective arteriography was performed of the SMA. The 5 French catheter was further advanced into the SMA trunk. A Progreat Alpha microcatheter was advanced through the 5 French catheter and into a second order branch supplying the transverse colon. Selective arteriography was performed in this branch via the microcatheter. The microcatheter was then further advanced into a left-sided branch of the transverse colonic artery supplying the mid to distal transverse colon.  Selective arteriography was performed via the microcatheter. The microcatheter was further advanced into an additional branch of this artery and selective arteriography performed. The microcatheter was then further advanced into a further additional branch of the artery and selective arteriography performed. Attempt was made to further advance the microcatheter in this branch over a guidewire. The microcatheter was then retracted and additional arteriography performed. A slurry of Gel-Foam pledgets was then made and diluted in saline and contrast. Embolization was then performed through the microcatheter at the level of distal transverse colonic arterial supply with diluted Gel-Foam pledgets. Additional arteriography was performed through the microcatheter. Catheters were removed. Fluoroscopic injection of contrast was performed at the level of the arteriotomy via the 5 French sheath to assess level of femoral puncture and patency of the artery prior to closure. Arteriotomy closure was performed using a 6 [place] Angio-Seal device.  FINDINGS: The superior mesenteric artery is normally patent. One of the early right-sided branches of the SMA trunk supplies the transverse colon and demonstrates an early bifurcation into proximal and distal branches. This was catheterized and selective arteriography performed. Further selective arteriography was then performed of the left sided branch of this trunk that supplies the mid to distal transverse colon. Selective arteriography further distally in this trunk demonstrates active contrast extravasation from a focal inferior branch along the mesenteric aspect of the distal transverse colon near the splenic flexure. This is in the region of contrast extravasation seen by CT angiography. Attempt to further selectively catheterize the arcade along the mesenteric aspect of the colon at this level to place embolization coils was difficult due to tortuosity and small size of the vessels. Initially, a microcatheter was able to be advanced into the proximal aspect of the arcade but this did result in dissection of the artery with occlusion and the microcatheter had to be retracted. After initial occlusion, the vessel did spontaneously open back up after a few minutes which was confirmed by arteriography. As sub-selective coil embolization was not possible, decision was made to proceed with Gel-Foam embolization of the distal transverse arterial supply in order to treat the arterial hemorrhage. This resulted in slow flow and cessation of visualization of smaller distal branch vessels. No further active contrast extravasation was seen after embolization. IMPRESSION: 1. Active contrast extravasation in the region of contrast extravasation seen by CT angiography at the level of the distal transverse colon near the splenic flexure from distal arterial supply of the transverse colonic arcade. 2. Gel-Foam embolization was performed of distal transverse colonic arterial supply as coil embolization could not be performed  due to dissection of an arterial branch supplying the region of focal bleeding along the mesenteric aspect of distal transverse colonic arterial supply. The dissection did eventually open back up but sub selective coil embolization was not possible. Electronically Signed   By: Aletta Edouard M.D.   On: 08/01/2022 12:04   CT ANGIO GI BLEED  Result Date: 08/01/2022 CLINICAL DATA:  Mid abdominal pain. Bright red rectal bleeding. End-stage renal disease. EXAM: CTA ABDOMEN AND PELVIS WITHOUT AND WITH CONTRAST TECHNIQUE: Multidetector CT imaging of the abdomen and pelvis was performed using the standard protocol during bolus administration of intravenous contrast. Multiplanar reconstructed images and MIPs were obtained and reviewed to evaluate the vascular anatomy. RADIATION DOSE REDUCTION: This exam was performed according to the departmental dose-optimization program which includes automated exposure control, adjustment of the mA and/or kV according to patient size and/or use of iterative reconstruction technique. CONTRAST:  41m OMNIPAQUE IOHEXOL  350 MG/ML SOLN COMPARISON:  09/07/2020 FINDINGS: VASCULAR Aorta: Atheromatous wall thickening and calcification of the aorta. No aneurysm or dissection Celiac: Atheromatous plaque at the origin accentuated by median arcuate ligament based on morphology, but without critical stenosis. SMA: Moderate origin stenosis due to calcified plaque with mild poststenotic dilatation. No acute finding. Renals: Symmetrically enhancing renal arteries without high-grade proximal stenosis. IMA: Patent Inflow: Extensive atheromatous calcification. Proximal Outflow: Atheromatous plaque with up to 50% narrowing at the right common iliac artery. Veins: Unremarkable Review of the MIP images confirms the above findings. NON-VASCULAR Lower chest: Cardiomegaly and atherosclerosis. Hazy appearance of markings at the lung bases, favor scarring. Hepatobiliary: Some surface lobulation and caudate lobe  enlargement suggested, cirrhosis is conceivable but not definite.Cholelithiasis. No acute inflammation or biliary obstruction seen. Pancreas: Unremarkable. Spleen: Unremarkable. Adrenals/Urinary Tract: Negative adrenals. No hydronephrosis or stone. Severe bilateral renal atrophy in keeping with end-stage renal disease history. Simple bilateral renal cysts measuring up to 11 mm on the right, no follow-up imaging recommended. Unremarkable bladder. Stomach/Bowel: Diverticular colon. Actively intraluminal hemorrhage is seen at a distal transverse diverticulum along the inferior wall, site marked on 6:95. No bowel wall thickening or obstruction. No appendicitis. Lymphatic: Negative for mass or adenopathy Reproductive:Unremarkable for age Other: No ascites or pneumoperitoneum. Musculoskeletal: Extensive spinal degeneration. ER attending made aware via epic chat. IMPRESSION: 1. 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 2. Atherosclerosis with ~ 50% narrowing at the SMA and right common iliac arteries. 3. Additional chronic findings are described above. Electronically Signed   By: Jorje Guild M.D.   On: 08/01/2022 07:28    Assessment / Plan: 77 year old female with a history of a diverticular bleed s/p coil embolization of distal right colic artery [id number] who presented to the ED this morning with painless hematochezia x 3 episodes at home and x 3 since arriving to the ED. Hemoglobin 10.3 grams. CTA identified active diverticular hemorrhage at the distal transverse colon and atherosclerosis with 50% narrowing of the SMA and right common iliac arteries. IR performed Gelfoam injection on 3/12 with no bleeding at the end of exam. -Clear liquid diet today.  ? Advance to soft this evening vs tomorrow AM. -Continue to monitor Hgb. -If Hgb stable tomorrow AM and no further sign of bleeding then ok to D/C home.   Acute on chronic anemia, secondary to acute GI bleed and ESRD:  Hgb 8.6 grams today with  no further sign of significant bleeding.   ESRD on hemodialysis every MWF  GERD -PPI IV QD.   LOS: 1 day   Laban Emperor. Missi Mcmackin  08/02/2022, 1:28 PM

## 2022-08-02 NOTE — Plan of Care (Signed)

## 2022-08-02 NOTE — Progress Notes (Signed)
Pt receives out-pt HD at [facility] on MWF. Will assist as needed.   Jaelee Laughter Renal Navigator [phone] 

## 2022-08-02 NOTE — Progress Notes (Signed)
BP 154/61, pt required hydralazine PRN per order. Pt refused BP meds, stating she does not take BP meds on the day of the dialysis.

## 2022-08-02 NOTE — Progress Notes (Signed)
PT Cancellation Note  Patient Details Name: Crystalyn Thames MRN: [id number] DOB: Nov 21, 1945   Cancelled Treatment:    Reason Eval/Treat Not Completed: Other (comment) PT orders received, chart reviewed. Pt noted to be off the floor at hemodialysis. Will f/u as able.  Lavone Nian, PT, DPT 08/02/22, 8:27 AM   Waunita Schooner 08/02/2022, 8:27 AM

## 2022-08-02 NOTE — Progress Notes (Signed)
PROGRESS NOTE                                                                                                                                                                                                             Patient Demographics:    Alexa Dougherty, is a 77 y.o. female, DOB - 04-Nov-1945, [id number]  Outpatient Primary MD for the patient is Bartholome Bill, MD    LOS - 1  Admit date - 08/01/2022    Chief Complaint  Patient presents with   Rectal Bleeding       Brief Narrative (HPI from H&P)   77 y.o. female with medical history significant of  PMH ESRD on HD MWF , anemia, GERD, Hypertension, hyperlipidemia, history of diverticular bleed one year ago for which she required IR intervention and PRBCS, patient presents to ED with 3 episode of bloody stools that started 2:30 am day of presentation. Patient noted no associated pain, n/v, fall , presyncope, chest pain or sob associated with symptoms.  Her further workup in the ER suggested lower GI bleed likely from diverticular source, she was admitted to the hospital for further care.      Subjective:    [employer] today has, No headache, No chest pain, No abdominal pain - No Nausea, No new weakness tingling or numbness, no SOB   Assessment  & Plan :    Acute lower GI bleed causing acute blood loss anemia in a patient with anemia of chronic disease due to ESRD and history of diverticular bleed in the past.  She has been seen both by IR and [facility], currently she has been embolized by IR on 08/01/2022 after which bleeding seems to have subsided, continue to monitor CBC, continue PPI.  Clear liquid diet.  Monitor closely.  ESRD.  On MWF dialysis schedule.  Renal on board.  GERD.  On PPI.  Hypertension.  Home medications resumed with as needed hydralazine on board.  Dyslipidemia.  On statin continue.       Condition - Extremely Guarded  Family  Communication  :  called daughter [name] [phone]  08/02/2022 at 8:20 AM and message left  Code Status :  Full  Consults  :  GI, IR, Renal  PUD Prophylaxis : PPI   Procedures  :     IR Embolization 08/01/22 -  1. Active contrast extravasation in the region of contrast extravasation seen by CT angiography at the level of the distal transverse colon near the splenic flexure from distal arterial supply of the transverse colonic arcade. 2. Gel-Foam embolization was performed of distal transverse colonic arterial supply as coil embolization could not be performed due to dissection of an arterial branch supplying the region of focal bleeding along the mesenteric aspect of distal transverse colonic arterial supply. The dissection did eventually open back up but sub selective coil embolization was not possible.   CTA - 1. 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 2. Atherosclerosis with ~ 50% narrowing at the SMA and right common iliac arteries. 3. Additional chronic findings are described above.      Disposition Plan  :    Status is: Inpatient  DVT Prophylaxis  :    SCDs Start: 08/01/22 1252   Lab Results  Component Value Date   PLT 151 08/02/2022    Diet :  Diet Order             Diet clear liquid Room service appropriate? Yes; Fluid consistency: Thin  Diet effective now                    Inpatient Medications  Scheduled Meds:  amLODipine  10 mg Oral QPM   atorvastatin  10 mg Oral QPM   Chlorhexidine Gluconate Cloth  6 each Topical Q0600   hydrALAZINE  25 mg Oral TID   metoprolol tartrate  50 mg Oral BID   pantoprazole (PROTONIX) IV  40 mg Intravenous Q24H   Continuous Infusions: PRN Meds:.acetaminophen **OR** acetaminophen, albuterol, hydrALAZINE, labetalol, lidocaine (PF), lidocaine-prilocaine, ondansetron **OR** ondansetron (ZOFRAN) IV, pentafluoroprop-tetrafluoroeth  Antibiotics  :    Anti-infectives (From admission, onward)    None          Objective:   Vitals:   08/02/22 0438 08/02/22 0439 08/02/22 0440 08/02/22 0700  BP:      Pulse: 88 87 86 81  Resp: '20 18 19 14  '$ Temp:      TempSrc:      SpO2: 96% 97% 96% 96%  Weight:      Height:        Wt Readings from Last 3 Encounters:  08/02/22 70.2 kg  05/18/22 67.6 kg  05/09/22 67.6 kg    No intake or output data in the 24 hours ending 08/02/22 0827   Physical Exam  Awake Alert, No new F.N deficits, Normal affect [place].AT,PERRAL Supple Neck, No JVD,   Symmetrical Chest wall movement, Good air movement bilaterally, CTAB RRR,No Gallops,Rubs or new Murmurs,  +ve B.Sounds, Abd Soft, No tenderness,   No Cyanosis, Clubbing or edema     RN pressure injury documentation:      Data Review:    Recent Labs  Lab 08/01/22 0605 08/01/22 0852 08/01/22 1255 08/02/22 0312  WBC 7.6  --   --  6.7  HGB 10.3* 9.7* 8.8* 9.3*  HCT 32.9* 31.0* 28.3* 28.6*  PLT 166  --   --  151  MCV 89.4  --   --  87.2  MCH 28.0  --   --  28.4  MCHC 31.3  --   --  32.5  RDW 15.1  --   --  15.3  LYMPHSABS 0.9  --   --   --   MONOABS 0.7  --   --   --   EOSABS 0.2  --   --   --  BASOSABS 0.1  --   --   --     Recent Labs  Lab 08/01/22 0448 08/01/22 0605 08/01/22 0852 08/02/22 0312  NA 134*  --   --  137  K 5.3*  --  4.7 4.8  CL 94*  --   --  97*  CO2 28  --   --  28  ANIONGAP 12  --   --  12  GLUCOSE 108*  --   --  83  BUN 37*  --   --  45*  CREATININE 6.00*  --   --  8.33*  AST 42*  --   --  22  ALT 18  --   --  14  ALKPHOS 121  --   --  109  BILITOT 0.9  --   --  0.7  ALBUMIN 3.9  --   --  3.2*  INR  --  1.0  --  1.1  TSH  --  1.619  --   --   CALCIUM 8.6*  --   --  8.5*      Recent Labs  Lab 08/01/22 0448 08/01/22 0605 08/02/22 0312  INR  --  1.0 1.1  TSH  --  1.619  --   CALCIUM 8.6*  --  8.5*    Radiology Reports IR Angiogram Visceral Selective  Result Date: 08/01/2022 INDICATION: Lower GI bleed with rectal bleeding. Positive CTA with  active contrast extravasation at the level of the distal transverse colon near the splenic flexure. 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 EXAM: 1. ULTRASOUND GUIDANCE FOR VASCULAR ACCESS OF THE RIGHT COMMON FEMORAL ARTERY 2. SELECTIVE ARTERIOGRAPHY OF THE SUPERIOR MESENTERIC ARTERY 3. ADDITIONAL SELECTIVE ARTERIOGRAPHY OF SECOND ORDER BRANCH OF SUPERIOR MESENTERIC ARTERY 4. ADDITIONAL SELECTIVE ARTERIOGRAPHY OF THIRD ORDER TRANSVERSE COLONIC BRANCH OF SUPERIOR MESENTERIC ARTERY 5. ADDITIONAL SELECTIVE ARTERIOGRAPHY OF FOURTH ORDER SUPPLY TO THE TRANSVERSE COLON 6. TRANSCATHETER EMBOLIZATION OF ARTERIAL SUPPLY TO THE TRANSVERSE COLON TO TREAT HEMORRHAGE MEDICATIONS: NONE ANESTHESIA/SEDATION: Moderate (conscious) sedation was employed during this procedure. A total of Versed 2.0 mg and Fentanyl 100 mcg was administered intravenously. Moderate Sedation Time: 83 minutes. The patient's level of consciousness and vital signs were monitored continuously by radiology nursing throughout the procedure under my direct supervision. CONTRAST:  50 ML OMNIPAQUE 300 FLUOROSCOPY TIME:  Radiation Exposure Index (as provided by the fluoroscopic device): 99991111 mGy Kerma COMPLICATIONS: None immediate. PROCEDURE: Informed consent was obtained from the patient and her daughter following explanation of the procedure, risks, benefits and alternatives. The patient's daughter understands, agrees and consents for the procedure. All questions were addressed. A time out was performed prior to the initiation of the procedure. Maximal barrier sterile technique utilized including caps, mask, sterile gowns, sterile gloves, large sterile drape, hand hygiene, and chlorhexidine prep. During the procedure local anesthesia was provided with 1% lidocaine. Ultrasound was used to confirm patency of the right common femoral artery. A permanent ultrasound image  was saved and recorded. Access of the right common femoral artery was performed under direct ultrasound guidance with a micropuncture set. After obtaining arterial access, a 5 [place] vascular sheath was advanced over a guidewire. A 5 French Cobra catheter was advanced into the abdominal aorta. This was used to selectively catheterize the superior mesenteric artery. Selective arteriography was performed of the SMA. The 5 French catheter was further advanced into the SMA trunk. A Progreat Alpha  microcatheter was advanced through the 5 French catheter and into a second order branch supplying the transverse colon. Selective arteriography was performed in this branch via the microcatheter. The microcatheter was then further advanced into a left-sided branch of the transverse colonic artery supplying the mid to distal transverse colon. Selective arteriography was performed via the microcatheter. The microcatheter was further advanced into an additional branch of this artery and selective arteriography performed. The microcatheter was then further advanced into a further additional branch of the artery and selective arteriography performed. Attempt was made to further advance the microcatheter in this branch over a guidewire. The microcatheter was then retracted and additional arteriography performed. A slurry of Gel-Foam pledgets was then made and diluted in saline and contrast. Embolization was then performed through the microcatheter at the level of distal transverse colonic arterial supply with diluted Gel-Foam pledgets. Additional arteriography was performed through the microcatheter. Catheters were removed. Fluoroscopic injection of contrast was performed at the level of the arteriotomy via the 5 French sheath to assess level of femoral puncture and patency of the artery prior to closure. Arteriotomy closure was performed using a 6 [place] Angio-Seal device. FINDINGS: The superior mesenteric artery is normally patent.  One of the early right-sided branches of the SMA trunk supplies the transverse colon and demonstrates an early bifurcation into proximal and distal branches. This was catheterized and selective arteriography performed. Further selective arteriography was then performed of the left sided branch of this trunk that supplies the mid to distal transverse colon. Selective arteriography further distally in this trunk demonstrates active contrast extravasation from a focal inferior branch along the mesenteric aspect of the distal transverse colon near the splenic flexure. This is in the region of contrast extravasation seen by CT angiography. Attempt to further selectively catheterize the arcade along the mesenteric aspect of the colon at this level to place embolization coils was difficult due to tortuosity and small size of the vessels. Initially, a microcatheter was able to be advanced into the proximal aspect of the arcade but this did result in dissection of the artery with occlusion and the microcatheter had to be retracted. After initial occlusion, the vessel did spontaneously open back up after a few minutes which was confirmed by arteriography. As sub-selective coil embolization was not possible, decision was made to proceed with Gel-Foam embolization of the distal transverse arterial supply in order to treat the arterial hemorrhage. This resulted in slow flow and cessation of visualization of smaller distal branch vessels. No further active contrast extravasation was seen after embolization. IMPRESSION: 1. Active contrast extravasation in the region of contrast extravasation seen by CT angiography at the level of the distal transverse colon near the splenic flexure from distal arterial supply of the transverse colonic arcade. 2. Gel-Foam embolization was performed of distal transverse colonic arterial supply as coil embolization could not be performed due to dissection of an arterial branch supplying the region of  focal bleeding along the mesenteric aspect of distal transverse colonic arterial supply. The dissection did eventually open back up but sub selective coil embolization was not possible. Electronically Signed   By: Aletta Edouard M.D.   On: 08/01/2022 12:04   IR US Guide Vasc Access Right  Result Date: 08/01/2022 INDICATION: Lower GI bleed with rectal bleeding. Positive CTA with active contrast extravasation at the level of the distal transverse colon near the splenic flexure. History of prior transcatheter embolization for diverticular bleed at the level of the ascending colon near the hepatic flexure  in 2022. EXAM: 1. ULTRASOUND GUIDANCE FOR VASCULAR ACCESS OF THE RIGHT COMMON FEMORAL ARTERY 2. SELECTIVE ARTERIOGRAPHY OF THE SUPERIOR MESENTERIC ARTERY 3. ADDITIONAL SELECTIVE ARTERIOGRAPHY OF SECOND ORDER BRANCH OF SUPERIOR MESENTERIC ARTERY 4. ADDITIONAL SELECTIVE ARTERIOGRAPHY OF THIRD ORDER TRANSVERSE COLONIC BRANCH OF SUPERIOR MESENTERIC ARTERY 5. ADDITIONAL SELECTIVE ARTERIOGRAPHY OF FOURTH ORDER SUPPLY TO THE TRANSVERSE COLON 6. TRANSCATHETER EMBOLIZATION OF ARTERIAL SUPPLY TO THE TRANSVERSE COLON TO TREAT HEMORRHAGE MEDICATIONS: NONE ANESTHESIA/SEDATION: Moderate (conscious) sedation was employed during this procedure. A total of Versed 2.0 mg and Fentanyl 100 mcg was administered intravenously. Moderate Sedation Time: 83 minutes. The patient's level of consciousness and vital signs were monitored continuously by radiology nursing throughout the procedure under my direct supervision. CONTRAST:  50 ML OMNIPAQUE 300 FLUOROSCOPY TIME:  Radiation Exposure Index (as provided by the fluoroscopic device): 99991111 mGy Kerma COMPLICATIONS: None immediate. PROCEDURE: Informed consent was obtained from the patient and her daughter following explanation of the procedure, risks, benefits and alternatives. The patient's daughter understands, agrees and consents for the procedure. All questions were addressed. A time out  was performed prior to the initiation of the procedure. Maximal barrier sterile technique utilized including caps, mask, sterile gowns, sterile gloves, large sterile drape, hand hygiene, and chlorhexidine prep. During the procedure local anesthesia was provided with 1% lidocaine. Ultrasound was used to confirm patency of the right common femoral artery. A permanent ultrasound image was saved and recorded. Access of the right common femoral artery was performed under direct ultrasound guidance with a micropuncture set. After obtaining arterial access, a 5 [place] vascular sheath was advanced over a guidewire. A 5 French Cobra catheter was advanced into the abdominal aorta. This was used to selectively catheterize the superior mesenteric artery. Selective arteriography was performed of the SMA. The 5 French catheter was further advanced into the SMA trunk. A Progreat Alpha microcatheter was advanced through the 5 French catheter and into a second order branch supplying the transverse colon. Selective arteriography was performed in this branch via the microcatheter. The microcatheter was then further advanced into a left-sided branch of the transverse colonic artery supplying the mid to distal transverse colon. Selective arteriography was performed via the microcatheter. The microcatheter was further advanced into an additional branch of this artery and selective arteriography performed. The microcatheter was then further advanced into a further additional branch of the artery and selective arteriography performed. Attempt was made to further advance the microcatheter in this branch over a guidewire. The microcatheter was then retracted and additional arteriography performed. A slurry of Gel-Foam pledgets was then made and diluted in saline and contrast. Embolization was then performed through the microcatheter at the level of distal transverse colonic arterial supply with diluted Gel-Foam pledgets. Additional  arteriography was performed through the microcatheter. Catheters were removed. Fluoroscopic injection of contrast was performed at the level of the arteriotomy via the 5 French sheath to assess level of femoral puncture and patency of the artery prior to closure. Arteriotomy closure was performed using a 6 [place] Angio-Seal device. FINDINGS: The superior mesenteric artery is normally patent. One of the early right-sided branches of the SMA trunk supplies the transverse colon and demonstrates an early bifurcation into proximal and distal branches. This was catheterized and selective arteriography performed. Further selective arteriography was then performed of the left sided branch of this trunk that supplies the mid to distal transverse colon. Selective arteriography further distally in this trunk demonstrates active contrast extravasation from a focal inferior branch along the mesenteric aspect of  the distal transverse colon near the splenic flexure. This is in the region of contrast extravasation seen by CT angiography. Attempt to further selectively catheterize the arcade along the mesenteric aspect of the colon at this level to place embolization coils was difficult due to tortuosity and small size of the vessels. Initially, a microcatheter was able to be advanced into the proximal aspect of the arcade but this did result in dissection of the artery with occlusion and the microcatheter had to be retracted. After initial occlusion, the vessel did spontaneously open back up after a few minutes which was confirmed by arteriography. As sub-selective coil embolization was not possible, decision was made to proceed with Gel-Foam embolization of the distal transverse arterial supply in order to treat the arterial hemorrhage. This resulted in slow flow and cessation of visualization of smaller distal branch vessels. No further active contrast extravasation was seen after embolization. IMPRESSION: 1. Active contrast  extravasation in the region of contrast extravasation seen by CT angiography at the level of the distal transverse colon near the splenic flexure from distal arterial supply of the transverse colonic arcade. 2. Gel-Foam embolization was performed of distal transverse colonic arterial supply as coil embolization could not be performed due to dissection of an arterial branch supplying the region of focal bleeding along the mesenteric aspect of distal transverse colonic arterial supply. The dissection did eventually open back up but sub selective coil embolization was not possible. Electronically Signed   By: Aletta Edouard M.D.   On: 08/01/2022 12:04   IR Angiogram Selective Each Additional Vessel  Result Date: 08/01/2022 INDICATION: Lower GI bleed with rectal bleeding. Positive CTA with active contrast extravasation at the level of the distal transverse colon near the splenic flexure. 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 EXAM: 1. ULTRASOUND GUIDANCE FOR VASCULAR ACCESS OF THE RIGHT COMMON FEMORAL ARTERY 2. SELECTIVE ARTERIOGRAPHY OF THE SUPERIOR MESENTERIC ARTERY 3. ADDITIONAL SELECTIVE ARTERIOGRAPHY OF SECOND ORDER BRANCH OF SUPERIOR MESENTERIC ARTERY 4. ADDITIONAL SELECTIVE ARTERIOGRAPHY OF THIRD ORDER TRANSVERSE COLONIC BRANCH OF SUPERIOR MESENTERIC ARTERY 5. ADDITIONAL SELECTIVE ARTERIOGRAPHY OF FOURTH ORDER SUPPLY TO THE TRANSVERSE COLON 6. TRANSCATHETER EMBOLIZATION OF ARTERIAL SUPPLY TO THE TRANSVERSE COLON TO TREAT HEMORRHAGE MEDICATIONS: NONE ANESTHESIA/SEDATION: Moderate (conscious) sedation was employed during this procedure. A total of Versed 2.0 mg and Fentanyl 100 mcg was administered intravenously. Moderate Sedation Time: 83 minutes. The patient's level of consciousness and vital signs were monitored continuously by radiology nursing throughout the procedure under my direct supervision. CONTRAST:   50 ML OMNIPAQUE 300 FLUOROSCOPY TIME:  Radiation Exposure Index (as provided by the fluoroscopic device): 99991111 mGy Kerma COMPLICATIONS: None immediate. PROCEDURE: Informed consent was obtained from the patient and her daughter following explanation of the procedure, risks, benefits and alternatives. The patient's daughter understands, agrees and consents for the procedure. All questions were addressed. A time out was performed prior to the initiation of the procedure. Maximal barrier sterile technique utilized including caps, mask, sterile gowns, sterile gloves, large sterile drape, hand hygiene, and chlorhexidine prep. During the procedure local anesthesia was provided with 1% lidocaine. Ultrasound was used to confirm patency of the right common femoral artery. A permanent ultrasound image was saved and recorded. Access of the right common femoral artery was performed under direct ultrasound guidance with a micropuncture set. After obtaining arterial access, a 5 [place] vascular sheath was advanced over a guidewire. A 5 French Cobra catheter was  advanced into the abdominal aorta. This was used to selectively catheterize the superior mesenteric artery. Selective arteriography was performed of the SMA. The 5 French catheter was further advanced into the SMA trunk. A Progreat Alpha microcatheter was advanced through the 5 French catheter and into a second order branch supplying the transverse colon. Selective arteriography was performed in this branch via the microcatheter. The microcatheter was then further advanced into a left-sided branch of the transverse colonic artery supplying the mid to distal transverse colon. Selective arteriography was performed via the microcatheter. The microcatheter was further advanced into an additional branch of this artery and selective arteriography performed. The microcatheter was then further advanced into a further additional branch of the artery and selective arteriography  performed. Attempt was made to further advance the microcatheter in this branch over a guidewire. The microcatheter was then retracted and additional arteriography performed. A slurry of Gel-Foam pledgets was then made and diluted in saline and contrast. Embolization was then performed through the microcatheter at the level of distal transverse colonic arterial supply with diluted Gel-Foam pledgets. Additional arteriography was performed through the microcatheter. Catheters were removed. Fluoroscopic injection of contrast was performed at the level of the arteriotomy via the 5 French sheath to assess level of femoral puncture and patency of the artery prior to closure. Arteriotomy closure was performed using a 6 [place] Angio-Seal device. FINDINGS: The superior mesenteric artery is normally patent. One of the early right-sided branches of the SMA trunk supplies the transverse colon and demonstrates an early bifurcation into proximal and distal branches. This was catheterized and selective arteriography performed. Further selective arteriography was then performed of the left sided branch of this trunk that supplies the mid to distal transverse colon. Selective arteriography further distally in this trunk demonstrates active contrast extravasation from a focal inferior branch along the mesenteric aspect of the distal transverse colon near the splenic flexure. This is in the region of contrast extravasation seen by CT angiography. Attempt to further selectively catheterize the arcade along the mesenteric aspect of the colon at this level to place embolization coils was difficult due to tortuosity and small size of the vessels. Initially, a microcatheter was able to be advanced into the proximal aspect of the arcade but this did result in dissection of the artery with occlusion and the microcatheter had to be retracted. After initial occlusion, the vessel did spontaneously open back up after a few minutes which was  confirmed by arteriography. As sub-selective coil embolization was not possible, decision was made to proceed with Gel-Foam embolization of the distal transverse arterial supply in order to treat the arterial hemorrhage. This resulted in slow flow and cessation of visualization of smaller distal branch vessels. No further active contrast extravasation was seen after embolization. IMPRESSION: 1. Active contrast extravasation in the region of contrast extravasation seen by CT angiography at the level of the distal transverse colon near the splenic flexure from distal arterial supply of the transverse colonic arcade. 2. Gel-Foam embolization was performed of distal transverse colonic arterial supply as coil embolization could not be performed due to dissection of an arterial branch supplying the region of focal bleeding along the mesenteric aspect of distal transverse colonic arterial supply. The dissection did eventually open back up but sub selective coil embolization was not possible. Electronically Signed   By: Aletta Edouard M.D.   On: 08/01/2022 12:04   IR Angiogram Selective Each Additional Vessel  Result Date: 08/01/2022 INDICATION: Lower GI bleed with rectal bleeding. Positive  CTA with active contrast extravasation at the level of the distal transverse colon near the splenic flexure. 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 EXAM: 1. ULTRASOUND GUIDANCE FOR VASCULAR ACCESS OF THE RIGHT COMMON FEMORAL ARTERY 2. SELECTIVE ARTERIOGRAPHY OF THE SUPERIOR MESENTERIC ARTERY 3. ADDITIONAL SELECTIVE ARTERIOGRAPHY OF SECOND ORDER BRANCH OF SUPERIOR MESENTERIC ARTERY 4. ADDITIONAL SELECTIVE ARTERIOGRAPHY OF THIRD ORDER TRANSVERSE COLONIC BRANCH OF SUPERIOR MESENTERIC ARTERY 5. ADDITIONAL SELECTIVE ARTERIOGRAPHY OF FOURTH ORDER SUPPLY TO THE TRANSVERSE COLON 6. TRANSCATHETER EMBOLIZATION OF ARTERIAL SUPPLY TO THE TRANSVERSE  COLON TO TREAT HEMORRHAGE MEDICATIONS: NONE ANESTHESIA/SEDATION: Moderate (conscious) sedation was employed during this procedure. A total of Versed 2.0 mg and Fentanyl 100 mcg was administered intravenously. Moderate Sedation Time: 83 minutes. The patient's level of consciousness and vital signs were monitored continuously by radiology nursing throughout the procedure under my direct supervision. CONTRAST:  50 ML OMNIPAQUE 300 FLUOROSCOPY TIME:  Radiation Exposure Index (as provided by the fluoroscopic device): 99991111 mGy Kerma COMPLICATIONS: None immediate. PROCEDURE: Informed consent was obtained from the patient and her daughter following explanation of the procedure, risks, benefits and alternatives. The patient's daughter understands, agrees and consents for the procedure. All questions were addressed. A time out was performed prior to the initiation of the procedure. Maximal barrier sterile technique utilized including caps, mask, sterile gowns, sterile gloves, large sterile drape, hand hygiene, and chlorhexidine prep. During the procedure local anesthesia was provided with 1% lidocaine. Ultrasound was used to confirm patency of the right common femoral artery. A permanent ultrasound image was saved and recorded. Access of the right common femoral artery was performed under direct ultrasound guidance with a micropuncture set. After obtaining arterial access, a 5 [place] vascular sheath was advanced over a guidewire. A 5 French Cobra catheter was advanced into the abdominal aorta. This was used to selectively catheterize the superior mesenteric artery. Selective arteriography was performed of the SMA. The 5 French catheter was further advanced into the SMA trunk. A Progreat Alpha microcatheter was advanced through the 5 French catheter and into a second order branch supplying the transverse colon. Selective arteriography was performed in this branch via the microcatheter. The microcatheter was then further  advanced into a left-sided branch of the transverse colonic artery supplying the mid to distal transverse colon. Selective arteriography was performed via the microcatheter. The microcatheter was further advanced into an additional branch of this artery and selective arteriography performed. The microcatheter was then further advanced into a further additional branch of the artery and selective arteriography performed. Attempt was made to further advance the microcatheter in this branch over a guidewire. The microcatheter was then retracted and additional arteriography performed. A slurry of Gel-Foam pledgets was then made and diluted in saline and contrast. Embolization was then performed through the microcatheter at the level of distal transverse colonic arterial supply with diluted Gel-Foam pledgets. Additional arteriography was performed through the microcatheter. Catheters were removed. Fluoroscopic injection of contrast was performed at the level of the arteriotomy via the 5 French sheath to assess level of femoral puncture and patency of the artery prior to closure. Arteriotomy closure was performed using a 6 [place] Angio-Seal device. FINDINGS: The superior mesenteric artery is normally patent. One of the early right-sided branches of the SMA trunk supplies the transverse colon and demonstrates an early bifurcation into proximal and distal branches. This was catheterized and selective arteriography performed. Further selective arteriography was then performed of  the left sided branch of this trunk that supplies the mid to distal transverse colon. Selective arteriography further distally in this trunk demonstrates active contrast extravasation from a focal inferior branch along the mesenteric aspect of the distal transverse colon near the splenic flexure. This is in the region of contrast extravasation seen by CT angiography. Attempt to further selectively catheterize the arcade along the mesenteric aspect of  the colon at this level to place embolization coils was difficult due to tortuosity and small size of the vessels. Initially, a microcatheter was able to be advanced into the proximal aspect of the arcade but this did result in dissection of the artery with occlusion and the microcatheter had to be retracted. After initial occlusion, the vessel did spontaneously open back up after a few minutes which was confirmed by arteriography. As sub-selective coil embolization was not possible, decision was made to proceed with Gel-Foam embolization of the distal transverse arterial supply in order to treat the arterial hemorrhage. This resulted in slow flow and cessation of visualization of smaller distal branch vessels. No further active contrast extravasation was seen after embolization. IMPRESSION: 1. Active contrast extravasation in the region of contrast extravasation seen by CT angiography at the level of the distal transverse colon near the splenic flexure from distal arterial supply of the transverse colonic arcade. 2. Gel-Foam embolization was performed of distal transverse colonic arterial supply as coil embolization could not be performed due to dissection of an arterial branch supplying the region of focal bleeding along the mesenteric aspect of distal transverse colonic arterial supply. The dissection did eventually open back up but sub selective coil embolization was not possible. Electronically Signed   By: Aletta Edouard M.D.   On: 08/01/2022 12:04   IR Angiogram Selective Each Additional Vessel  Result Date: 08/01/2022 INDICATION: Lower GI bleed with rectal bleeding. Positive CTA with active contrast extravasation at the level of the distal transverse colon near the splenic flexure. 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 EXAM: 1. ULTRASOUND GUIDANCE FOR VASCULAR ACCESS OF THE RIGHT COMMON FEMORAL ARTERY 2.  SELECTIVE ARTERIOGRAPHY OF THE SUPERIOR MESENTERIC ARTERY 3. ADDITIONAL SELECTIVE ARTERIOGRAPHY OF SECOND ORDER BRANCH OF SUPERIOR MESENTERIC ARTERY 4. ADDITIONAL SELECTIVE ARTERIOGRAPHY OF THIRD ORDER TRANSVERSE COLONIC BRANCH OF SUPERIOR MESENTERIC ARTERY 5. ADDITIONAL SELECTIVE ARTERIOGRAPHY OF FOURTH ORDER SUPPLY TO THE TRANSVERSE COLON 6. TRANSCATHETER EMBOLIZATION OF ARTERIAL SUPPLY TO THE TRANSVERSE COLON TO TREAT HEMORRHAGE MEDICATIONS: NONE ANESTHESIA/SEDATION: Moderate (conscious) sedation was employed during this procedure. A total of Versed 2.0 mg and Fentanyl 100 mcg was administered intravenously. Moderate Sedation Time: 83 minutes. The patient's level of consciousness and vital signs were monitored continuously by radiology nursing throughout the procedure under my direct supervision. CONTRAST:  50 ML OMNIPAQUE 300 FLUOROSCOPY TIME:  Radiation Exposure Index (as provided by the fluoroscopic device): 99991111 mGy Kerma COMPLICATIONS: None immediate. PROCEDURE: Informed consent was obtained from the patient and her daughter following explanation of the procedure, risks, benefits and alternatives. The patient's daughter understands, agrees and consents for the procedure. All questions were addressed. A time out was performed prior to the initiation of the procedure. Maximal barrier sterile technique utilized including caps, mask, sterile gowns, sterile gloves, large sterile drape, hand hygiene, and chlorhexidine prep. During the procedure local anesthesia was provided with 1% lidocaine. Ultrasound was used to confirm patency of the right common femoral artery. A permanent ultrasound image was saved and recorded. Access  of the right common femoral artery was performed under direct ultrasound guidance with a micropuncture set. After obtaining arterial access, a 5 [place] vascular sheath was advanced over a guidewire. A 5 French Cobra catheter was advanced into the abdominal aorta. This was used to selectively  catheterize the superior mesenteric artery. Selective arteriography was performed of the SMA. The 5 French catheter was further advanced into the SMA trunk. A Progreat Alpha microcatheter was advanced through the 5 French catheter and into a second order branch supplying the transverse colon. Selective arteriography was performed in this branch via the microcatheter. The microcatheter was then further advanced into a left-sided branch of the transverse colonic artery supplying the mid to distal transverse colon. Selective arteriography was performed via the microcatheter. The microcatheter was further advanced into an additional branch of this artery and selective arteriography performed. The microcatheter was then further advanced into a further additional branch of the artery and selective arteriography performed. Attempt was made to further advance the microcatheter in this branch over a guidewire. The microcatheter was then retracted and additional arteriography performed. A slurry of Gel-Foam pledgets was then made and diluted in saline and contrast. Embolization was then performed through the microcatheter at the level of distal transverse colonic arterial supply with diluted Gel-Foam pledgets. Additional arteriography was performed through the microcatheter. Catheters were removed. Fluoroscopic injection of contrast was performed at the level of the arteriotomy via the 5 French sheath to assess level of femoral puncture and patency of the artery prior to closure. Arteriotomy closure was performed using a 6 [place] Angio-Seal device. FINDINGS: The superior mesenteric artery is normally patent. One of the early right-sided branches of the SMA trunk supplies the transverse colon and demonstrates an early bifurcation into proximal and distal branches. This was catheterized and selective arteriography performed. Further selective arteriography was then performed of the left sided branch of this trunk that supplies  the mid to distal transverse colon. Selective arteriography further distally in this trunk demonstrates active contrast extravasation from a focal inferior branch along the mesenteric aspect of the distal transverse colon near the splenic flexure. This is in the region of contrast extravasation seen by CT angiography. Attempt to further selectively catheterize the arcade along the mesenteric aspect of the colon at this level to place embolization coils was difficult due to tortuosity and small size of the vessels. Initially, a microcatheter was able to be advanced into the proximal aspect of the arcade but this did result in dissection of the artery with occlusion and the microcatheter had to be retracted. After initial occlusion, the vessel did spontaneously open back up after a few minutes which was confirmed by arteriography. As sub-selective coil embolization was not possible, decision was made to proceed with Gel-Foam embolization of the distal transverse arterial supply in order to treat the arterial hemorrhage. This resulted in slow flow and cessation of visualization of smaller distal branch vessels. No further active contrast extravasation was seen after embolization. IMPRESSION: 1. Active contrast extravasation in the region of contrast extravasation seen by CT angiography at the level of the distal transverse colon near the splenic flexure from distal arterial supply of the transverse colonic arcade. 2. Gel-Foam embolization was performed of distal transverse colonic arterial supply as coil embolization could not be performed due to dissection of an arterial branch supplying the region of focal bleeding along the mesenteric aspect of distal transverse colonic arterial supply. The dissection did eventually open back up but sub selective coil embolization was  not possible. Electronically Signed   By: Aletta Edouard M.D.   On: 08/01/2022 12:04   IR EMBO ART  VEN HEMORR LYMPH EXTRAV  INC GUIDE  ROADMAPPING  Result Date: 08/01/2022 INDICATION: Lower GI bleed with rectal bleeding. Positive CTA with active contrast extravasation at the level of the distal transverse colon near the splenic flexure. 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 EXAM: 1. ULTRASOUND GUIDANCE FOR VASCULAR ACCESS OF THE RIGHT COMMON FEMORAL ARTERY 2. SELECTIVE ARTERIOGRAPHY OF THE SUPERIOR MESENTERIC ARTERY 3. ADDITIONAL SELECTIVE ARTERIOGRAPHY OF SECOND ORDER BRANCH OF SUPERIOR MESENTERIC ARTERY 4. ADDITIONAL SELECTIVE ARTERIOGRAPHY OF THIRD ORDER TRANSVERSE COLONIC BRANCH OF SUPERIOR MESENTERIC ARTERY 5. ADDITIONAL SELECTIVE ARTERIOGRAPHY OF FOURTH ORDER SUPPLY TO THE TRANSVERSE COLON 6. TRANSCATHETER EMBOLIZATION OF ARTERIAL SUPPLY TO THE TRANSVERSE COLON TO TREAT HEMORRHAGE MEDICATIONS: NONE ANESTHESIA/SEDATION: Moderate (conscious) sedation was employed during this procedure. A total of Versed 2.0 mg and Fentanyl 100 mcg was administered intravenously. Moderate Sedation Time: 83 minutes. The patient's level of consciousness and vital signs were monitored continuously by radiology nursing throughout the procedure under my direct supervision. CONTRAST:  50 ML OMNIPAQUE 300 FLUOROSCOPY TIME:  Radiation Exposure Index (as provided by the fluoroscopic device): 99991111 mGy Kerma COMPLICATIONS: None immediate. PROCEDURE: Informed consent was obtained from the patient and her daughter following explanation of the procedure, risks, benefits and alternatives. The patient's daughter understands, agrees and consents for the procedure. All questions were addressed. A time out was performed prior to the initiation of the procedure. Maximal barrier sterile technique utilized including caps, mask, sterile gowns, sterile gloves, large sterile drape, hand hygiene, and chlorhexidine prep. During the procedure local anesthesia was provided with 1% lidocaine.  Ultrasound was used to confirm patency of the right common femoral artery. A permanent ultrasound image was saved and recorded. Access of the right common femoral artery was performed under direct ultrasound guidance with a micropuncture set. After obtaining arterial access, a 5 [place] vascular sheath was advanced over a guidewire. A 5 French Cobra catheter was advanced into the abdominal aorta. This was used to selectively catheterize the superior mesenteric artery. Selective arteriography was performed of the SMA. The 5 French catheter was further advanced into the SMA trunk. A Progreat Alpha microcatheter was advanced through the 5 French catheter and into a second order branch supplying the transverse colon. Selective arteriography was performed in this branch via the microcatheter. The microcatheter was then further advanced into a left-sided branch of the transverse colonic artery supplying the mid to distal transverse colon. Selective arteriography was performed via the microcatheter. The microcatheter was further advanced into an additional branch of this artery and selective arteriography performed. The microcatheter was then further advanced into a further additional branch of the artery and selective arteriography performed. Attempt was made to further advance the microcatheter in this branch over a guidewire. The microcatheter was then retracted and additional arteriography performed. A slurry of Gel-Foam pledgets was then made and diluted in saline and contrast. Embolization was then performed through the microcatheter at the level of distal transverse colonic arterial supply with diluted Gel-Foam pledgets. Additional arteriography was performed through the microcatheter. Catheters were removed. Fluoroscopic injection of contrast was performed at the level of the arteriotomy via the 5 French sheath to assess level of femoral puncture and patency of the artery prior to closure. Arteriotomy closure was  performed using a 6 [place] Angio-Seal device. FINDINGS: The superior  mesenteric artery is normally patent. One of the early right-sided branches of the SMA trunk supplies the transverse colon and demonstrates an early bifurcation into proximal and distal branches. This was catheterized and selective arteriography performed. Further selective arteriography was then performed of the left sided branch of this trunk that supplies the mid to distal transverse colon. Selective arteriography further distally in this trunk demonstrates active contrast extravasation from a focal inferior branch along the mesenteric aspect of the distal transverse colon near the splenic flexure. This is in the region of contrast extravasation seen by CT angiography. Attempt to further selectively catheterize the arcade along the mesenteric aspect of the colon at this level to place embolization coils was difficult due to tortuosity and small size of the vessels. Initially, a microcatheter was able to be advanced into the proximal aspect of the arcade but this did result in dissection of the artery with occlusion and the microcatheter had to be retracted. After initial occlusion, the vessel did spontaneously open back up after a few minutes which was confirmed by arteriography. As sub-selective coil embolization was not possible, decision was made to proceed with Gel-Foam embolization of the distal transverse arterial supply in order to treat the arterial hemorrhage. This resulted in slow flow and cessation of visualization of smaller distal branch vessels. No further active contrast extravasation was seen after embolization. IMPRESSION: 1. Active contrast extravasation in the region of contrast extravasation seen by CT angiography at the level of the distal transverse colon near the splenic flexure from distal arterial supply of the transverse colonic arcade. 2. Gel-Foam embolization was performed of distal transverse colonic arterial supply  as coil embolization could not be performed due to dissection of an arterial branch supplying the region of focal bleeding along the mesenteric aspect of distal transverse colonic arterial supply. The dissection did eventually open back up but sub selective coil embolization was not possible. Electronically Signed   By: Aletta Edouard M.D.   On: 08/01/2022 12:04   CT ANGIO GI BLEED  Result Date: 08/01/2022 CLINICAL DATA:  Mid abdominal pain. Bright red rectal bleeding. End-stage renal disease. EXAM: CTA ABDOMEN AND PELVIS WITHOUT AND WITH CONTRAST TECHNIQUE: Multidetector CT imaging of the abdomen and pelvis was performed using the standard protocol during bolus administration of intravenous contrast. Multiplanar reconstructed images and MIPs were obtained and reviewed to evaluate the vascular anatomy. RADIATION DOSE REDUCTION: This exam was performed according to the departmental dose-optimization program which includes automated exposure control, adjustment of the mA and/or kV according to patient size and/or use of iterative reconstruction technique. CONTRAST:  28m OMNIPAQUE IOHEXOL 350 MG/ML SOLN COMPARISON:  09/07/2020 FINDINGS: VASCULAR Aorta: Atheromatous wall thickening and calcification of the aorta. No aneurysm or dissection Celiac: Atheromatous plaque at the origin accentuated by median arcuate ligament based on morphology, but without critical stenosis. SMA: Moderate origin stenosis due to calcified plaque with mild poststenotic dilatation. No acute finding. Renals: Symmetrically enhancing renal arteries without high-grade proximal stenosis. IMA: Patent Inflow: Extensive atheromatous calcification. Proximal Outflow: Atheromatous plaque with up to 50% narrowing at the right common iliac artery. Veins: Unremarkable Review of the MIP images confirms the above findings. NON-VASCULAR Lower chest: Cardiomegaly and atherosclerosis. Hazy appearance of markings at the lung bases, favor scarring.  Hepatobiliary: Some surface lobulation and caudate lobe enlargement suggested, cirrhosis is conceivable but not definite.Cholelithiasis. No acute inflammation or biliary obstruction seen. Pancreas: Unremarkable. Spleen: Unremarkable. Adrenals/Urinary Tract: Negative adrenals. No hydronephrosis or stone. Severe bilateral renal atrophy in keeping with  end-stage renal disease history. Simple bilateral renal cysts measuring up to 11 mm on the right, no follow-up imaging recommended. Unremarkable bladder. Stomach/Bowel: Diverticular colon. Actively intraluminal hemorrhage is seen at a distal transverse diverticulum along the inferior wall, site marked on 6:95. No bowel wall thickening or obstruction. No appendicitis. Lymphatic: Negative for mass or adenopathy Reproductive:Unremarkable for age Other: No ascites or pneumoperitoneum. Musculoskeletal: Extensive spinal degeneration. ER attending made aware via epic chat. IMPRESSION: 1. 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 2. Atherosclerosis with ~ 50% narrowing at the SMA and right common iliac arteries. 3. Additional chronic findings are described above. Electronically Signed   By: Jorje Guild M.D.   On: 08/01/2022 07:28      Signature  -   Lala Lund M.D on 08/02/2022 at 8:27 AM   -  To page go to [email or web address]

## 2022-08-03 LAB — CBC WITH DIFFERENTIAL/PLATELET
Abs Immature Granulocytes: 0.02 10*3/uL (ref 0.00–0.07)
Basophils Absolute: 0.1 10*3/uL (ref 0.0–0.1)
Basophils Relative: 1 %
Eosinophils Absolute: 0.2 10*3/uL (ref 0.0–0.5)
Eosinophils Relative: 3 %
HCT: 25 % — ABNORMAL LOW (ref 36.0–46.0)
Hemoglobin: 8.1 g/dL — ABNORMAL LOW (ref 12.0–15.0)
Immature Granulocytes: 0 %
Lymphocytes Relative: 16 %
Lymphs Abs: 1.1 10*3/uL (ref 0.7–4.0)
MCH: 28.4 pg (ref 26.0–34.0)
MCHC: 32.4 g/dL (ref 30.0–36.0)
MCV: 87.7 fL (ref 80.0–100.0)
Monocytes Absolute: 0.9 10*3/uL (ref 0.1–1.0)
Monocytes Relative: 13 %
Neutro Abs: 4.7 10*3/uL (ref 1.7–7.7)
Neutrophils Relative %: 67 %
Platelets: 141 10*3/uL — ABNORMAL LOW (ref 150–400)
RBC: 2.85 MIL/uL — ABNORMAL LOW (ref 3.87–5.11)
RDW: 15.1 % (ref 11.5–15.5)
WBC: 7.1 10*3/uL (ref 4.0–10.5)
nRBC: 0 % (ref 0.0–0.2)

## 2022-08-03 LAB — CBC
HCT: 26.3 % — ABNORMAL LOW (ref 36.0–46.0)
Hemoglobin: 8.8 g/dL — ABNORMAL LOW (ref 12.0–15.0)
MCH: 28.9 pg (ref 26.0–34.0)
MCHC: 33.5 g/dL (ref 30.0–36.0)
MCV: 86.5 fL (ref 80.0–100.0)
Platelets: 157 10*3/uL (ref 150–400)
RBC: 3.04 MIL/uL — ABNORMAL LOW (ref 3.87–5.11)
RDW: 15.1 % (ref 11.5–15.5)
WBC: 8 10*3/uL (ref 4.0–10.5)
nRBC: 0 % (ref 0.0–0.2)

## 2022-08-03 LAB — HEPATITIS B SURFACE ANTIBODY, QUANTITATIVE: Hep B S AB Quant (Post): 101.9 m[IU]/mL (ref >9.9)

## 2022-08-03 MED ORDER — HYDRALAZINE HCL 25 MG PO TABS
25.0000 mg | ORAL_TABLET | Freq: Three times a day (TID) | ORAL | Status: DC
  Administered 2022-08-03 – 2022-08-04 (×4): 25 mg via ORAL
  Filled 2022-08-03 (×4): qty 1

## 2022-08-03 MED ORDER — ORAL CARE MOUTH RINSE: 15.0000 mL | OROMUCOSAL | Status: DC | PRN

## 2022-08-03 MED ORDER — CHLORHEXIDINE GLUCONATE CLOTH 2 % EX PADS: 6.0000 | MEDICATED_PAD | Freq: Every day | CUTANEOUS | Status: DC

## 2022-08-03 MED ORDER — METOPROLOL TARTRATE 50 MG PO TABS
50.0000 mg | ORAL_TABLET | Freq: Two times a day (BID) | ORAL | Status: DC
  Administered 2022-08-03 – 2022-08-04 (×3): 50 mg via ORAL
  Filled 2022-08-03 (×3): qty 1

## 2022-08-03 NOTE — Evaluation (Signed)
Occupational Therapy Evaluation and Discharge Patient Details Name: Alexa Dougherty MRN: [id number] DOB: 16-Dec-1945 Today's Date: 08/03/2022   History of Present Illness Pt is a 77 y/o F admitted on 08/01/22 after presenting with c/o 3 episodes of bloody stools. CTA showed active diverticular hemorrhage at the distal transverse colon. Pt underwent IR gel-foam embolization of distal transverse colonic arterial supply. PMH: ESRD on HD MWF, anemia, GERD, HTN, HLD, diverticular bleed 1 year ago   Clinical Impression   Pt was independent prior to admission. Presents with generalized weakness requiring supervision for safety with ambulation, but no AD. Pt needs set up to supervision for ADLs. No OT needs, expect pt will progress as medical issues resolve. Recommend ADLs and ambulation with nursing staff. Pt has excellent family support. No further OT needs.      Recommendations for follow up therapy are one component of a multi-disciplinary discharge planning process, led by the attending physician.  Recommendations may be updated based on patient status, additional functional criteria and insurance authorization.   Follow Up Recommendations  No OT follow up     Assistance Recommended at Discharge Intermittent Supervision/Assistance  Patient can return home with the following A little help with walking and/or transfers;A little help with bathing/dressing/bathroom;Assistance with cooking/housework;Assist for transportation;Help with stairs or ramp for entrance    Functional Status Assessment  Patient has had a recent decline in their functional status and demonstrates the ability to make significant improvements in function in a reasonable and predictable amount of time.  Equipment Recommendations  None recommended by OT    Recommendations for Other Services       Precautions / Restrictions Precautions Precautions: Fall      Mobility Bed Mobility Overal bed mobility: Modified  Independent             General bed mobility comments: HOB up    Transfers Overall transfer level: Needs assistance Equipment used: None Transfers: Sit to/from Stand Sit to Stand: Supervision                  Balance Overall balance assessment: Needs assistance   Sitting balance-Leahy Scale: Good     Standing balance support: No upper extremity supported, During functional activity Standing balance-Leahy Scale: Fair                             ADL either performed or assessed with clinical judgement   ADL Overall ADL's : Needs assistance/impaired Eating/Feeding: Independent   Grooming: Wash/dry hands;Standing;Supervision/safety   Upper Body Bathing: Set up;Sitting   Lower Body Bathing: Supervison/ safety;Sit to/from stand   Upper Body Dressing : Set up;Sitting   Lower Body Dressing: Supervision/safety;Sit to/from stand   Toilet Transfer: Supervision/safety;Ambulation   Toileting- Clothing Manipulation and Hygiene: Supervision/safety       Functional mobility during ADLs: Supervision/safety       Vision Ability to See in Adequate Light: 0 Adequate Patient Visual Report: No change from baseline       Perception     Praxis      Pertinent Vitals/Pain Pain Assessment Pain Assessment: No/denies pain     Hand Dominance Right   Extremity/Trunk Assessment Upper Extremity Assessment Upper Extremity Assessment: Overall WFL for tasks assessed   Lower Extremity Assessment Lower Extremity Assessment: Defer to PT evaluation   Cervical / Trunk Assessment Cervical / Trunk Assessment: Normal   Communication Communication Communication: Prefers language other than [place];No difficulties   Cognition Arousal/Alertness: Awake/alert  Behavior During Therapy: WFL for tasks assessed/performed Overall Cognitive Status: Within Functional Limits for tasks assessed                                       General Comments        Exercises     Shoulder Instructions      Home Living Family/patient expects to be discharged to:: Private residence Living Arrangements: Children Available Help at Discharge: Family;Available 24 hours/day Type of Home: House Home Access: Stairs to enter [employer] of Steps: 1-2 Entrance Stairs-Rails: None Home Layout: Two level;Bed/bath upstairs Alternate Level Stairs-Number of Steps: flight Alternate Level Stairs-Rails: Right Bathroom Shower/Tub: Tub/shower unit;Walk-in shower   Bathroom Toilet: Standard     Home Equipment: Rollator (4 wheels)          Prior Functioning/Environment Prior Level of Function : Independent/Modified Independent             Mobility Comments: Pt ambulatory without AD, denies falls in the past 6 months.          OT Problem List:        OT Treatment/Interventions:      OT Goals(Current goals can be found in the care plan section) Acute Rehab OT Goals OT Goal Formulation: With patient  OT Frequency:      Co-evaluation              AM-PAC OT "6 Clicks" Daily Activity     Outcome Measure Help from another person eating meals?: None Help from another person taking care of personal grooming?: A Little Help from another person toileting, which includes using toliet, bedpan, or urinal?: A Little Help from another person bathing (including washing, rinsing, drying)?: A Little Help from another person to put on and taking off regular upper body clothing?: None Help from another person to put on and taking off regular lower body clothing?: A Little 6 Click Score: 20   End of Session    Activity Tolerance: Patient tolerated treatment well Patient left: in bed;with call bell/phone within reach;with family/visitor present  OT Visit Diagnosis: Muscle weakness (generalized) (M62.81)                Time: [id number] OT Time Calculation (min): 17 min Charges:  OT General Charges $OT Visit: 1 Visit OT Evaluation $OT  Eval Low Complexity: [place], OTR/L Acute Rehabilitation Services Office: [phone]  Malka So 08/03/2022, 12:24 PM

## 2022-08-03 NOTE — Progress Notes (Signed)
[facility] Progress Note  Name: Alexa Dougherty MRN: [id number] DOB: [birth year]  Chief Complaint:  Blood per rectum   Subjective:  Last HD on 3/13 with 0.7 kg UF - treatment was ended early due to hypotension and received minibolus 200 mL NS x 2. She had coffee this AM.  She reports that she had two non-bloody BM's    Review of systems:   Denies shortness of breath or chest pain  Denies n/v She asks about her diet order  --------- Background on consult:  Alexa Dougherty is a 77 y.o. female with a history of ESRD on HD, HTN, and gastritis who presented to [facility] with bright red blood per rectum.  GI was consulted.  She underwent gelfoam embolization earlier today with IR; per IR note no further active bleeding after embolization.  Nephrology was consulted.  The patient is to be transferred to [facility] as she is ESRD.  Note that she had a diverticular bleed a year ago which required IR intervention per charting.  She speaks [place] and this is not offered on the video interpreter service so she asked if she could call her daughter.  She is feeling better but tired and hungry.  She states that she had a BM after the procedure and that now she feels less urgency; she wasn't able to see if there was blood in it.      Intake/Output Summary (Last 24 hours) at 08/03/2022 1017 Last data filed at 08/02/2022 1600 Gross per 24 hour  Intake 400 ml  Output 701 ml  Net -301 ml    Vitals:  Vitals:   08/03/22 0614 08/03/22 0700 08/03/22 0709 08/03/22 0800  BP: (!) 168/56  (!) 155/62 (!) 165/61  Pulse: 78 70  69  Resp: '16 17  12  '$ Temp:      TempSrc:      SpO2: 96% 97%  97%  Weight:      Height:         Physical Exam:   General adult female in bed in no acute distress HEENT normocephalic atraumatic extraocular movements intact sclera anicteric Neck supple trachea midline Lungs clear to auscultation bilaterally normal work of breathing at rest  Heart S1S2 no rub Abdomen soft  nontender nondistended Extremities no edema  Psych normal mood and affect Access LUE AVF bruit and thrill    Medications reviewed   Labs:     Latest Ref Rng & Units 08/02/2022    3:12 AM 08/01/2022    8:52 AM 08/01/2022    4:48 AM  BMP  Glucose 70 - 99 mg/dL 83   108   BUN 8 - 23 mg/dL 45   37   Creatinine 0.44 - 1.00 mg/dL 8.33   6.00   Sodium 135 - 145 mmol/L 137   134   Potassium 3.5 - 5.1 mmol/L 4.8  4.7  5.3   Chloride 98 - 111 mmol/L 97   94   CO2 22 - 32 mmol/L 28   28   Calcium 8.9 - 10.3 mg/dL 8.5   8.6    Outpatient HD orders: [facility]  3.75hrs MWF 400/500 edw 65.7kg 2K 2Ca LU AVF No heparin Venofer '100mg'$  qHD x10 No current ESA - last mircera 77mg on 06/19/22 Hectorol 34m qHD   Sensipar '60mg'$  qd   Assessment/Plan:   # Acute GI bleed - Appreciate IR and GI  - s/p gelfoam embolization - on clear diet per primary team    #  ESRD  - HD per MWF schedule  - outpatient orders as above   # HTN  - continue home regimen  - optimize volume with HD    # Anemia of acute blood loss as well as 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  Appreciate IR and GI - started aranesp 100 mcg every Wednesday and would need ESA as an outpatient with HD for now     # Secondary hyperparathyroidism/metabolic bone disease - hyperphosphatemia - on renvela  - continue hectorol (at HD outpatient) and sensipar (appears given at home)   Disposition - per primary team discretion.  Team is considering discharge tomorrow if remains stable   Claudia Desanctis, MD 08/03/2022 10:34 AM

## 2022-08-03 NOTE — Progress Notes (Signed)
PROGRESS NOTE                                                                                                                                                                                                             Patient Demographics:    Alexa Dougherty, is a 77 y.o. female, DOB - 11-26-1945, [id number]  Outpatient Primary MD for the patient is Bartholome Bill, MD    LOS - 2  Admit date - 08/01/2022    Chief Complaint  Patient presents with   Rectal Bleeding       Brief Narrative (HPI from H&P)   77 y.o. female with medical history significant of  PMH ESRD on HD MWF , anemia, GERD, Hypertension, hyperlipidemia, history of diverticular bleed one year ago for which she required IR intervention and PRBCS, patient presents to ED with 3 episode of bloody stools that started 2:30 am day of presentation. Patient noted no associated pain, n/v, fall , presyncope, chest pain or sob associated with symptoms.  Her further workup in the ER suggested lower GI bleed likely from diverticular source, she was admitted to the hospital for further care.      Subjective:   Patient in bed, appears comfortable, denies any headache, no fever, no chest pain or pressure, no shortness of breath , no abdominal pain. No focal weakness.  Brown bowel movements with no blood, wants to eat food.   Assessment  & Plan :    Acute lower GI bleed causing acute blood loss anemia in a patient with anemia of chronic disease due to ESRD and history of diverticular bleed in the past.  She has been seen both by IR and [facility], currently she has been embolized by IR on 08/01/2022 after which bleeding seems to have subsided, continue to monitor CBC, continue PPI.  Clear liquid diet for now, if H&H remained stable on 08/03/2022 will advance diet to soft in the afternoon.  Monitor closely.  ESRD.  On MWF dialysis schedule.  Renal on board.  GERD.  On  PPI.  Hypertension.  Home medications resumed with as needed hydralazine on board.  Dyslipidemia.  On statin continue.       Condition - Extremely Guarded  Family Communication  :  called daughter [name] [phone]  08/02/2022 at 8:20 AM and message left  Code  Status :  Full  Consults  :  GI, IR, Renal  PUD Prophylaxis : PPI   Procedures  :     IR Embolization 08/01/22 -  1. Active contrast extravasation in the region of contrast extravasation seen by CT angiography at the level of the distal transverse colon near the splenic flexure from distal arterial supply of the transverse colonic arcade. 2. Gel-Foam embolization was performed of distal transverse colonic arterial supply as coil embolization could not be performed due to dissection of an arterial branch supplying the region of focal bleeding along the mesenteric aspect of distal transverse colonic arterial supply. The dissection did eventually open back up but sub selective coil embolization was not possible.   CTA - 1. 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 2. Atherosclerosis with ~ 50% narrowing at the SMA and right common iliac arteries. 3. Additional chronic findings are described above.      Disposition Plan  :    Status is: Inpatient  DVT Prophylaxis  :    SCDs Start: 08/01/22 1252   Lab Results  Component Value Date   PLT 141 (L) 08/03/2022    Diet :  Diet Order             Diet clear liquid Room service appropriate? Yes; Fluid consistency: Thin  Diet effective now                    Inpatient Medications  Scheduled Meds:  atorvastatin  10 mg Oral QPM   Chlorhexidine Gluconate Cloth  6 each Topical Q0600   cinacalcet  60 mg Oral Q supper   darbepoetin (ARANESP) injection - NON-DIALYSIS  100 mcg Subcutaneous Q Wed-1800   doxercalciferol  3 mcg Intravenous Q M,W,F-HD   hydrALAZINE  25 mg Oral TID   metoprolol tartrate  50 mg Oral BID   pantoprazole (PROTONIX) IV  40  mg Intravenous Q24H   sevelamer carbonate  2,400 mg Oral TID WC   Continuous Infusions: PRN Meds:.acetaminophen **OR** acetaminophen, albuterol, hydrALAZINE, labetalol, ondansetron **OR** ondansetron (ZOFRAN) IV, mouth rinse  Antibiotics  :    Anti-infectives (From admission, onward)    None         Objective:   Vitals:   08/03/22 0614 08/03/22 0700 08/03/22 0709 08/03/22 0800  BP: (!) 168/56  (!) 155/62 (!) 165/61  Pulse: 78 70  69  Resp: '16 17  12  '$ Temp:      TempSrc:      SpO2: 96% 97%  97%  Weight:      Height:        Wt Readings from Last 3 Encounters:  08/03/22 66.9 kg  05/18/22 67.6 kg  05/09/22 67.6 kg     Intake/Output Summary (Last 24 hours) at 08/03/2022 1041 Last data filed at 08/02/2022 1600 Gross per 24 hour  Intake 400 ml  Output 701 ml  Net -301 ml     Physical Exam  Awake Alert, No new F.N deficits, Normal affect Annex.AT,PERRAL Supple Neck, No JVD,   Symmetrical Chest wall movement, Good air movement bilaterally, CTAB RRR,No Gallops,Rubs or new Murmurs,  +ve B.Sounds, Abd Soft, No tenderness,   No Cyanosis, Clubbing or edema     RN pressure injury documentation:      Data Review:    Recent Labs  Lab 08/01/22 0605 08/01/22 0852 08/01/22 1255 08/02/22 0312 08/02/22 0842 08/02/22 1557 08/03/22 0221  WBC 7.6  --   --  6.7 6.6 7.3  7.1  HGB 10.3*   < > 8.8* 9.3* 8.6* 8.8* 8.1*  HCT 32.9*   < > 28.3* 28.6* 26.3* 26.1* 25.0*  PLT 166  --   --  151 152 147* 141*  MCV 89.4  --   --  87.2 87.1 85.6 87.7  MCH 28.0  --   --  28.4 28.5 28.9 28.4  MCHC 31.3  --   --  32.5 32.7 33.7 32.4  RDW 15.1  --   --  15.3 15.3 15.0 15.1  LYMPHSABS 0.9  --   --   --   --   --  1.1  MONOABS 0.7  --   --   --   --   --  0.9  EOSABS 0.2  --   --   --   --   --  0.2  BASOSABS 0.1  --   --   --   --   --  0.1   < > = values in this interval not displayed.    Recent Labs  Lab 08/01/22 0448 08/01/22 0605 08/01/22 0852 08/02/22 0312  NA 134*  --    --  137  K 5.3*  --  4.7 4.8  CL 94*  --   --  97*  CO2 28  --   --  28  ANIONGAP 12  --   --  12  GLUCOSE 108*  --   --  83  BUN 37*  --   --  45*  CREATININE 6.00*  --   --  8.33*  AST 42*  --   --  22  ALT 18  --   --  14  ALKPHOS 121  --   --  109  BILITOT 0.9  --   --  0.7  ALBUMIN 3.9  --   --  3.2*  INR  --  1.0  --  1.1  TSH  --  1.619  --   --   HGBA1C  --   --  4.7*  --   CALCIUM 8.6*  --   --  8.5*      Recent Labs  Lab 08/01/22 0448 08/01/22 0605 08/01/22 0852 08/02/22 0312  INR  --  1.0  --  1.1  TSH  --  1.619  --   --   HGBA1C  --   --  4.7*  --   CALCIUM 8.6*  --   --  8.5*    Radiology Reports IR Angiogram Visceral Selective  Result Date: 08/01/2022 INDICATION: Lower GI bleed with rectal bleeding. Positive CTA with active contrast extravasation at the level of the distal transverse colon near the splenic flexure. 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 EXAM: 1. ULTRASOUND GUIDANCE FOR VASCULAR ACCESS OF THE RIGHT COMMON FEMORAL ARTERY 2. SELECTIVE ARTERIOGRAPHY OF THE SUPERIOR MESENTERIC ARTERY 3. ADDITIONAL SELECTIVE ARTERIOGRAPHY OF SECOND ORDER BRANCH OF SUPERIOR MESENTERIC ARTERY 4. ADDITIONAL SELECTIVE ARTERIOGRAPHY OF THIRD ORDER TRANSVERSE COLONIC BRANCH OF SUPERIOR MESENTERIC ARTERY 5. ADDITIONAL SELECTIVE ARTERIOGRAPHY OF FOURTH ORDER SUPPLY TO THE TRANSVERSE COLON 6. TRANSCATHETER EMBOLIZATION OF ARTERIAL SUPPLY TO THE TRANSVERSE COLON TO TREAT HEMORRHAGE MEDICATIONS: NONE ANESTHESIA/SEDATION: Moderate (conscious) sedation was employed during this procedure. A total of Versed 2.0 mg and Fentanyl 100 mcg was administered intravenously. Moderate Sedation Time: 83 minutes. The patient's level of consciousness and vital signs were monitored continuously by radiology nursing  throughout the procedure under my direct supervision. CONTRAST:  50 ML OMNIPAQUE 300  FLUOROSCOPY TIME:  Radiation Exposure Index (as provided by the fluoroscopic device): 99991111 mGy Kerma COMPLICATIONS: None immediate. PROCEDURE: Informed consent was obtained from the patient and her daughter following explanation of the procedure, risks, benefits and alternatives. The patient's daughter understands, agrees and consents for the procedure. All questions were addressed. A time out was performed prior to the initiation of the procedure. Maximal barrier sterile technique utilized including caps, mask, sterile gowns, sterile gloves, large sterile drape, hand hygiene, and chlorhexidine prep. During the procedure local anesthesia was provided with 1% lidocaine. Ultrasound was used to confirm patency of the right common femoral artery. A permanent ultrasound image was saved and recorded. Access of the right common femoral artery was performed under direct ultrasound guidance with a micropuncture set. After obtaining arterial access, a 5 [place] vascular sheath was advanced over a guidewire. A 5 French Cobra catheter was advanced into the abdominal aorta. This was used to selectively catheterize the superior mesenteric artery. Selective arteriography was performed of the SMA. The 5 French catheter was further advanced into the SMA trunk. A Progreat Alpha microcatheter was advanced through the 5 French catheter and into a second order branch supplying the transverse colon. Selective arteriography was performed in this branch via the microcatheter. The microcatheter was then further advanced into a left-sided branch of the transverse colonic artery supplying the mid to distal transverse colon. Selective arteriography was performed via the microcatheter. The microcatheter was further advanced into an additional branch of this artery and selective arteriography performed. The microcatheter was then further advanced into a further additional branch of the artery and selective arteriography performed. Attempt was made  to further advance the microcatheter in this branch over a guidewire. The microcatheter was then retracted and additional arteriography performed. A slurry of Gel-Foam pledgets was then made and diluted in saline and contrast. Embolization was then performed through the microcatheter at the level of distal transverse colonic arterial supply with diluted Gel-Foam pledgets. Additional arteriography was performed through the microcatheter. Catheters were removed. Fluoroscopic injection of contrast was performed at the level of the arteriotomy via the 5 French sheath to assess level of femoral puncture and patency of the artery prior to closure. Arteriotomy closure was performed using a 6 [place] Angio-Seal device. FINDINGS: The superior mesenteric artery is normally patent. One of the early right-sided branches of the SMA trunk supplies the transverse colon and demonstrates an early bifurcation into proximal and distal branches. This was catheterized and selective arteriography performed. Further selective arteriography was then performed of the left sided branch of this trunk that supplies the mid to distal transverse colon. Selective arteriography further distally in this trunk demonstrates active contrast extravasation from a focal inferior branch along the mesenteric aspect of the distal transverse colon near the splenic flexure. This is in the region of contrast extravasation seen by CT angiography. Attempt to further selectively catheterize the arcade along the mesenteric aspect of the colon at this level to place embolization coils was difficult due to tortuosity and small size of the vessels. Initially, a microcatheter was able to be advanced into the proximal aspect of the arcade but this did result in dissection of the artery with occlusion and the microcatheter had to be retracted. After initial occlusion, the vessel did spontaneously open back up after a few minutes which was confirmed by arteriography. As  sub-selective coil embolization was not possible, decision was made to proceed  with Gel-Foam embolization of the distal transverse arterial supply in order to treat the arterial hemorrhage. This resulted in slow flow and cessation of visualization of smaller distal branch vessels. No further active contrast extravasation was seen after embolization. IMPRESSION: 1. Active contrast extravasation in the region of contrast extravasation seen by CT angiography at the level of the distal transverse colon near the splenic flexure from distal arterial supply of the transverse colonic arcade. 2. Gel-Foam embolization was performed of distal transverse colonic arterial supply as coil embolization could not be performed due to dissection of an arterial branch supplying the region of focal bleeding along the mesenteric aspect of distal transverse colonic arterial supply. The dissection did eventually open back up but sub selective coil embolization was not possible. Electronically Signed   By: Aletta Edouard M.D.   On: 08/01/2022 12:04   IR US Guide Vasc Access Right  Result Date: 08/01/2022 INDICATION: Lower GI bleed with rectal bleeding. Positive CTA with active contrast extravasation at the level of the distal transverse colon near the splenic flexure. 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 EXAM: 1. ULTRASOUND GUIDANCE FOR VASCULAR ACCESS OF THE RIGHT COMMON FEMORAL ARTERY 2. SELECTIVE ARTERIOGRAPHY OF THE SUPERIOR MESENTERIC ARTERY 3. ADDITIONAL SELECTIVE ARTERIOGRAPHY OF SECOND ORDER BRANCH OF SUPERIOR MESENTERIC ARTERY 4. ADDITIONAL SELECTIVE ARTERIOGRAPHY OF THIRD ORDER TRANSVERSE COLONIC BRANCH OF SUPERIOR MESENTERIC ARTERY 5. ADDITIONAL SELECTIVE ARTERIOGRAPHY OF FOURTH ORDER SUPPLY TO THE TRANSVERSE COLON 6. TRANSCATHETER EMBOLIZATION OF ARTERIAL SUPPLY TO THE TRANSVERSE COLON TO TREAT HEMORRHAGE MEDICATIONS: NONE  ANESTHESIA/SEDATION: Moderate (conscious) sedation was employed during this procedure. A total of Versed 2.0 mg and Fentanyl 100 mcg was administered intravenously. Moderate Sedation Time: 83 minutes. The patient's level of consciousness and vital signs were monitored continuously by radiology nursing throughout the procedure under my direct supervision. CONTRAST:  50 ML OMNIPAQUE 300 FLUOROSCOPY TIME:  Radiation Exposure Index (as provided by the fluoroscopic device): 99991111 mGy Kerma COMPLICATIONS: None immediate. PROCEDURE: Informed consent was obtained from the patient and her daughter following explanation of the procedure, risks, benefits and alternatives. The patient's daughter understands, agrees and consents for the procedure. All questions were addressed. A time out was performed prior to the initiation of the procedure. Maximal barrier sterile technique utilized including caps, mask, sterile gowns, sterile gloves, large sterile drape, hand hygiene, and chlorhexidine prep. During the procedure local anesthesia was provided with 1% lidocaine. Ultrasound was used to confirm patency of the right common femoral artery. A permanent ultrasound image was saved and recorded. Access of the right common femoral artery was performed under direct ultrasound guidance with a micropuncture set. After obtaining arterial access, a 5 [place] vascular sheath was advanced over a guidewire. A 5 French Cobra catheter was advanced into the abdominal aorta. This was used to selectively catheterize the superior mesenteric artery. Selective arteriography was performed of the SMA. The 5 French catheter was further advanced into the SMA trunk. A Progreat Alpha microcatheter was advanced through the 5 French catheter and into a second order branch supplying the transverse colon. Selective arteriography was performed in this branch via the microcatheter. The microcatheter was then further advanced into a left-sided branch of the transverse  colonic artery supplying the mid to distal transverse colon. Selective arteriography was performed via the microcatheter. The microcatheter was further advanced into an additional branch of this artery and selective arteriography performed. The microcatheter was then further advanced into  a further additional branch of the artery and selective arteriography performed. Attempt was made to further advance the microcatheter in this branch over a guidewire. The microcatheter was then retracted and additional arteriography performed. A slurry of Gel-Foam pledgets was then made and diluted in saline and contrast. Embolization was then performed through the microcatheter at the level of distal transverse colonic arterial supply with diluted Gel-Foam pledgets. Additional arteriography was performed through the microcatheter. Catheters were removed. Fluoroscopic injection of contrast was performed at the level of the arteriotomy via the 5 French sheath to assess level of femoral puncture and patency of the artery prior to closure. Arteriotomy closure was performed using a 6 [place] Angio-Seal device. FINDINGS: The superior mesenteric artery is normally patent. One of the early right-sided branches of the SMA trunk supplies the transverse colon and demonstrates an early bifurcation into proximal and distal branches. This was catheterized and selective arteriography performed. Further selective arteriography was then performed of the left sided branch of this trunk that supplies the mid to distal transverse colon. Selective arteriography further distally in this trunk demonstrates active contrast extravasation from a focal inferior branch along the mesenteric aspect of the distal transverse colon near the splenic flexure. This is in the region of contrast extravasation seen by CT angiography. Attempt to further selectively catheterize the arcade along the mesenteric aspect of the colon at this level to place embolization coils  was difficult due to tortuosity and small size of the vessels. Initially, a microcatheter was able to be advanced into the proximal aspect of the arcade but this did result in dissection of the artery with occlusion and the microcatheter had to be retracted. After initial occlusion, the vessel did spontaneously open back up after a few minutes which was confirmed by arteriography. As sub-selective coil embolization was not possible, decision was made to proceed with Gel-Foam embolization of the distal transverse arterial supply in order to treat the arterial hemorrhage. This resulted in slow flow and cessation of visualization of smaller distal branch vessels. No further active contrast extravasation was seen after embolization. IMPRESSION: 1. Active contrast extravasation in the region of contrast extravasation seen by CT angiography at the level of the distal transverse colon near the splenic flexure from distal arterial supply of the transverse colonic arcade. 2. Gel-Foam embolization was performed of distal transverse colonic arterial supply as coil embolization could not be performed due to dissection of an arterial branch supplying the region of focal bleeding along the mesenteric aspect of distal transverse colonic arterial supply. The dissection did eventually open back up but sub selective coil embolization was not possible. Electronically Signed   By: Aletta Edouard M.D.   On: 08/01/2022 12:04   IR Angiogram Selective Each Additional Vessel  Result Date: 08/01/2022 INDICATION: Lower GI bleed with rectal bleeding. Positive CTA with active contrast extravasation at the level of the distal transverse colon near the splenic flexure. 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 EXAM: 1. ULTRASOUND GUIDANCE FOR VASCULAR ACCESS OF THE RIGHT COMMON FEMORAL ARTERY 2. SELECTIVE ARTERIOGRAPHY OF THE SUPERIOR MESENTERIC  ARTERY 3. ADDITIONAL SELECTIVE ARTERIOGRAPHY OF SECOND ORDER BRANCH OF SUPERIOR MESENTERIC ARTERY 4. ADDITIONAL SELECTIVE ARTERIOGRAPHY OF THIRD ORDER TRANSVERSE COLONIC BRANCH OF SUPERIOR MESENTERIC ARTERY 5. ADDITIONAL SELECTIVE ARTERIOGRAPHY OF FOURTH ORDER SUPPLY TO THE TRANSVERSE COLON 6. TRANSCATHETER EMBOLIZATION OF ARTERIAL SUPPLY TO THE TRANSVERSE COLON TO TREAT HEMORRHAGE MEDICATIONS: NONE ANESTHESIA/SEDATION: Moderate (conscious) sedation  was employed during this procedure. A total of Versed 2.0 mg and Fentanyl 100 mcg was administered intravenously. Moderate Sedation Time: 83 minutes. The patient's level of consciousness and vital signs were monitored continuously by radiology nursing throughout the procedure under my direct supervision. CONTRAST:  50 ML OMNIPAQUE 300 FLUOROSCOPY TIME:  Radiation Exposure Index (as provided by the fluoroscopic device): 99991111 mGy Kerma COMPLICATIONS: None immediate. PROCEDURE: Informed consent was obtained from the patient and her daughter following explanation of the procedure, risks, benefits and alternatives. The patient's daughter understands, agrees and consents for the procedure. All questions were addressed. A time out was performed prior to the initiation of the procedure. Maximal barrier sterile technique utilized including caps, mask, sterile gowns, sterile gloves, large sterile drape, hand hygiene, and chlorhexidine prep. During the procedure local anesthesia was provided with 1% lidocaine. Ultrasound was used to confirm patency of the right common femoral artery. A permanent ultrasound image was saved and recorded. Access of the right common femoral artery was performed under direct ultrasound guidance with a micropuncture set. After obtaining arterial access, a 5 [place] vascular sheath was advanced over a guidewire. A 5 French Cobra catheter was advanced into the abdominal aorta. This was used to selectively catheterize the superior mesenteric artery. Selective  arteriography was performed of the SMA. The 5 French catheter was further advanced into the SMA trunk. A Progreat Alpha microcatheter was advanced through the 5 French catheter and into a second order branch supplying the transverse colon. Selective arteriography was performed in this branch via the microcatheter. The microcatheter was then further advanced into a left-sided branch of the transverse colonic artery supplying the mid to distal transverse colon. Selective arteriography was performed via the microcatheter. The microcatheter was further advanced into an additional branch of this artery and selective arteriography performed. The microcatheter was then further advanced into a further additional branch of the artery and selective arteriography performed. Attempt was made to further advance the microcatheter in this branch over a guidewire. The microcatheter was then retracted and additional arteriography performed. A slurry of Gel-Foam pledgets was then made and diluted in saline and contrast. Embolization was then performed through the microcatheter at the level of distal transverse colonic arterial supply with diluted Gel-Foam pledgets. Additional arteriography was performed through the microcatheter. Catheters were removed. Fluoroscopic injection of contrast was performed at the level of the arteriotomy via the 5 French sheath to assess level of femoral puncture and patency of the artery prior to closure. Arteriotomy closure was performed using a 6 [place] Angio-Seal device. FINDINGS: The superior mesenteric artery is normally patent. One of the early right-sided branches of the SMA trunk supplies the transverse colon and demonstrates an early bifurcation into proximal and distal branches. This was catheterized and selective arteriography performed. Further selective arteriography was then performed of the left sided branch of this trunk that supplies the mid to distal transverse colon. Selective  arteriography further distally in this trunk demonstrates active contrast extravasation from a focal inferior branch along the mesenteric aspect of the distal transverse colon near the splenic flexure. This is in the region of contrast extravasation seen by CT angiography. Attempt to further selectively catheterize the arcade along the mesenteric aspect of the colon at this level to place embolization coils was difficult due to tortuosity and small size of the vessels. Initially, a microcatheter was able to be advanced into the proximal aspect of the arcade but this did result in dissection of the artery with occlusion and the  microcatheter had to be retracted. After initial occlusion, the vessel did spontaneously open back up after a few minutes which was confirmed by arteriography. As sub-selective coil embolization was not possible, decision was made to proceed with Gel-Foam embolization of the distal transverse arterial supply in order to treat the arterial hemorrhage. This resulted in slow flow and cessation of visualization of smaller distal branch vessels. No further active contrast extravasation was seen after embolization. IMPRESSION: 1. Active contrast extravasation in the region of contrast extravasation seen by CT angiography at the level of the distal transverse colon near the splenic flexure from distal arterial supply of the transverse colonic arcade. 2. Gel-Foam embolization was performed of distal transverse colonic arterial supply as coil embolization could not be performed due to dissection of an arterial branch supplying the region of focal bleeding along the mesenteric aspect of distal transverse colonic arterial supply. The dissection did eventually open back up but sub selective coil embolization was not possible. Electronically Signed   By: Aletta Edouard M.D.   On: 08/01/2022 12:04   IR Angiogram Selective Each Additional Vessel  Result Date: 08/01/2022 INDICATION: Lower GI bleed with  rectal bleeding. Positive CTA with active contrast extravasation at the level of the distal transverse colon near the splenic flexure. 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 EXAM: 1. ULTRASOUND GUIDANCE FOR VASCULAR ACCESS OF THE RIGHT COMMON FEMORAL ARTERY 2. SELECTIVE ARTERIOGRAPHY OF THE SUPERIOR MESENTERIC ARTERY 3. ADDITIONAL SELECTIVE ARTERIOGRAPHY OF SECOND ORDER BRANCH OF SUPERIOR MESENTERIC ARTERY 4. ADDITIONAL SELECTIVE ARTERIOGRAPHY OF THIRD ORDER TRANSVERSE COLONIC BRANCH OF SUPERIOR MESENTERIC ARTERY 5. ADDITIONAL SELECTIVE ARTERIOGRAPHY OF FOURTH ORDER SUPPLY TO THE TRANSVERSE COLON 6. TRANSCATHETER EMBOLIZATION OF ARTERIAL SUPPLY TO THE TRANSVERSE COLON TO TREAT HEMORRHAGE MEDICATIONS: NONE ANESTHESIA/SEDATION: Moderate (conscious) sedation was employed during this procedure. A total of Versed 2.0 mg and Fentanyl 100 mcg was administered intravenously. Moderate Sedation Time: 83 minutes. The patient's level of consciousness and vital signs were monitored continuously by radiology nursing throughout the procedure under my direct supervision. CONTRAST:  50 ML OMNIPAQUE 300 FLUOROSCOPY TIME:  Radiation Exposure Index (as provided by the fluoroscopic device): 99991111 mGy Kerma COMPLICATIONS: None immediate. PROCEDURE: Informed consent was obtained from the patient and her daughter following explanation of the procedure, risks, benefits and alternatives. The patient's daughter understands, agrees and consents for the procedure. All questions were addressed. A time out was performed prior to the initiation of the procedure. Maximal barrier sterile technique utilized including caps, mask, sterile gowns, sterile gloves, large sterile drape, hand hygiene, and chlorhexidine prep. During the procedure local anesthesia was provided with 1% lidocaine. Ultrasound was used to confirm patency of the right common femoral  artery. A permanent ultrasound image was saved and recorded. Access of the right common femoral artery was performed under direct ultrasound guidance with a micropuncture set. After obtaining arterial access, a 5 [place] vascular sheath was advanced over a guidewire. A 5 French Cobra catheter was advanced into the abdominal aorta. This was used to selectively catheterize the superior mesenteric artery. Selective arteriography was performed of the SMA. The 5 French catheter was further advanced into the SMA trunk. A Progreat Alpha microcatheter was advanced through the 5 French catheter and into a second order branch supplying the transverse colon. Selective arteriography was performed in this branch via the microcatheter. The microcatheter was then further advanced into a left-sided branch of the transverse colonic artery supplying  the mid to distal transverse colon. Selective arteriography was performed via the microcatheter. The microcatheter was further advanced into an additional branch of this artery and selective arteriography performed. The microcatheter was then further advanced into a further additional branch of the artery and selective arteriography performed. Attempt was made to further advance the microcatheter in this branch over a guidewire. The microcatheter was then retracted and additional arteriography performed. A slurry of Gel-Foam pledgets was then made and diluted in saline and contrast. Embolization was then performed through the microcatheter at the level of distal transverse colonic arterial supply with diluted Gel-Foam pledgets. Additional arteriography was performed through the microcatheter. Catheters were removed. Fluoroscopic injection of contrast was performed at the level of the arteriotomy via the 5 French sheath to assess level of femoral puncture and patency of the artery prior to closure. Arteriotomy closure was performed using a 6 [place] Angio-Seal device. FINDINGS: The superior  mesenteric artery is normally patent. One of the early right-sided branches of the SMA trunk supplies the transverse colon and demonstrates an early bifurcation into proximal and distal branches. This was catheterized and selective arteriography performed. Further selective arteriography was then performed of the left sided branch of this trunk that supplies the mid to distal transverse colon. Selective arteriography further distally in this trunk demonstrates active contrast extravasation from a focal inferior branch along the mesenteric aspect of the distal transverse colon near the splenic flexure. This is in the region of contrast extravasation seen by CT angiography. Attempt to further selectively catheterize the arcade along the mesenteric aspect of the colon at this level to place embolization coils was difficult due to tortuosity and small size of the vessels. Initially, a microcatheter was able to be advanced into the proximal aspect of the arcade but this did result in dissection of the artery with occlusion and the microcatheter had to be retracted. After initial occlusion, the vessel did spontaneously open back up after a few minutes which was confirmed by arteriography. As sub-selective coil embolization was not possible, decision was made to proceed with Gel-Foam embolization of the distal transverse arterial supply in order to treat the arterial hemorrhage. This resulted in slow flow and cessation of visualization of smaller distal branch vessels. No further active contrast extravasation was seen after embolization. IMPRESSION: 1. Active contrast extravasation in the region of contrast extravasation seen by CT angiography at the level of the distal transverse colon near the splenic flexure from distal arterial supply of the transverse colonic arcade. 2. Gel-Foam embolization was performed of distal transverse colonic arterial supply as coil embolization could not be performed due to dissection of an  arterial branch supplying the region of focal bleeding along the mesenteric aspect of distal transverse colonic arterial supply. The dissection did eventually open back up but sub selective coil embolization was not possible. Electronically Signed   By: Aletta Edouard M.D.   On: 08/01/2022 12:04   IR Angiogram Selective Each Additional Vessel  Result Date: 08/01/2022 INDICATION: Lower GI bleed with rectal bleeding. Positive CTA with active contrast extravasation at the level of the distal transverse colon near the splenic flexure. 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 EXAM: 1. ULTRASOUND GUIDANCE FOR VASCULAR ACCESS OF THE RIGHT COMMON FEMORAL ARTERY 2. SELECTIVE ARTERIOGRAPHY OF THE SUPERIOR MESENTERIC ARTERY 3. ADDITIONAL SELECTIVE ARTERIOGRAPHY OF SECOND ORDER BRANCH OF SUPERIOR MESENTERIC ARTERY 4. ADDITIONAL SELECTIVE ARTERIOGRAPHY OF THIRD ORDER TRANSVERSE COLONIC  BRANCH OF SUPERIOR MESENTERIC ARTERY 5. ADDITIONAL SELECTIVE ARTERIOGRAPHY OF FOURTH ORDER SUPPLY TO THE TRANSVERSE COLON 6. TRANSCATHETER EMBOLIZATION OF ARTERIAL SUPPLY TO THE TRANSVERSE COLON TO TREAT HEMORRHAGE MEDICATIONS: NONE ANESTHESIA/SEDATION: Moderate (conscious) sedation was employed during this procedure. A total of Versed 2.0 mg and Fentanyl 100 mcg was administered intravenously. Moderate Sedation Time: 83 minutes. The patient's level of consciousness and vital signs were monitored continuously by radiology nursing throughout the procedure under my direct supervision. CONTRAST:  50 ML OMNIPAQUE 300 FLUOROSCOPY TIME:  Radiation Exposure Index (as provided by the fluoroscopic device): 99991111 mGy Kerma COMPLICATIONS: None immediate. PROCEDURE: Informed consent was obtained from the patient and her daughter following explanation of the procedure, risks, benefits and alternatives. The patient's daughter understands, agrees and consents for  the procedure. All questions were addressed. A time out was performed prior to the initiation of the procedure. Maximal barrier sterile technique utilized including caps, mask, sterile gowns, sterile gloves, large sterile drape, hand hygiene, and chlorhexidine prep. During the procedure local anesthesia was provided with 1% lidocaine. Ultrasound was used to confirm patency of the right common femoral artery. A permanent ultrasound image was saved and recorded. Access of the right common femoral artery was performed under direct ultrasound guidance with a micropuncture set. After obtaining arterial access, a 5 [place] vascular sheath was advanced over a guidewire. A 5 French Cobra catheter was advanced into the abdominal aorta. This was used to selectively catheterize the superior mesenteric artery. Selective arteriography was performed of the SMA. The 5 French catheter was further advanced into the SMA trunk. A Progreat Alpha microcatheter was advanced through the 5 French catheter and into a second order branch supplying the transverse colon. Selective arteriography was performed in this branch via the microcatheter. The microcatheter was then further advanced into a left-sided branch of the transverse colonic artery supplying the mid to distal transverse colon. Selective arteriography was performed via the microcatheter. The microcatheter was further advanced into an additional branch of this artery and selective arteriography performed. The microcatheter was then further advanced into a further additional branch of the artery and selective arteriography performed. Attempt was made to further advance the microcatheter in this branch over a guidewire. The microcatheter was then retracted and additional arteriography performed. A slurry of Gel-Foam pledgets was then made and diluted in saline and contrast. Embolization was then performed through the microcatheter at the level of distal transverse colonic arterial  supply with diluted Gel-Foam pledgets. Additional arteriography was performed through the microcatheter. Catheters were removed. Fluoroscopic injection of contrast was performed at the level of the arteriotomy via the 5 French sheath to assess level of femoral puncture and patency of the artery prior to closure. Arteriotomy closure was performed using a 6 [place] Angio-Seal device. FINDINGS: The superior mesenteric artery is normally patent. One of the early right-sided branches of the SMA trunk supplies the transverse colon and demonstrates an early bifurcation into proximal and distal branches. This was catheterized and selective arteriography performed. Further selective arteriography was then performed of the left sided branch of this trunk that supplies the mid to distal transverse colon. Selective arteriography further distally in this trunk demonstrates active contrast extravasation from a focal inferior branch along the mesenteric aspect of the distal transverse colon near the splenic flexure. This is in the region of contrast extravasation seen by CT angiography. Attempt to further selectively catheterize the arcade along the mesenteric aspect of the colon at this level to place embolization coils was difficult due  to tortuosity and small size of the vessels. Initially, a microcatheter was able to be advanced into the proximal aspect of the arcade but this did result in dissection of the artery with occlusion and the microcatheter had to be retracted. After initial occlusion, the vessel did spontaneously open back up after a few minutes which was confirmed by arteriography. As sub-selective coil embolization was not possible, decision was made to proceed with Gel-Foam embolization of the distal transverse arterial supply in order to treat the arterial hemorrhage. This resulted in slow flow and cessation of visualization of smaller distal branch vessels. No further active contrast extravasation was seen after  embolization. IMPRESSION: 1. Active contrast extravasation in the region of contrast extravasation seen by CT angiography at the level of the distal transverse colon near the splenic flexure from distal arterial supply of the transverse colonic arcade. 2. Gel-Foam embolization was performed of distal transverse colonic arterial supply as coil embolization could not be performed due to dissection of an arterial branch supplying the region of focal bleeding along the mesenteric aspect of distal transverse colonic arterial supply. The dissection did eventually open back up but sub selective coil embolization was not possible. Electronically Signed   By: Aletta Edouard M.D.   On: 08/01/2022 12:04   IR EMBO ART  VEN HEMORR LYMPH EXTRAV  INC GUIDE ROADMAPPING  Result Date: 08/01/2022 INDICATION: Lower GI bleed with rectal bleeding. Positive CTA with active contrast extravasation at the level of the distal transverse colon near the splenic flexure. 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 EXAM: 1. ULTRASOUND GUIDANCE FOR VASCULAR ACCESS OF THE RIGHT COMMON FEMORAL ARTERY 2. SELECTIVE ARTERIOGRAPHY OF THE SUPERIOR MESENTERIC ARTERY 3. ADDITIONAL SELECTIVE ARTERIOGRAPHY OF SECOND ORDER BRANCH OF SUPERIOR MESENTERIC ARTERY 4. ADDITIONAL SELECTIVE ARTERIOGRAPHY OF THIRD ORDER TRANSVERSE COLONIC BRANCH OF SUPERIOR MESENTERIC ARTERY 5. ADDITIONAL SELECTIVE ARTERIOGRAPHY OF FOURTH ORDER SUPPLY TO THE TRANSVERSE COLON 6. TRANSCATHETER EMBOLIZATION OF ARTERIAL SUPPLY TO THE TRANSVERSE COLON TO TREAT HEMORRHAGE MEDICATIONS: NONE ANESTHESIA/SEDATION: Moderate (conscious) sedation was employed during this procedure. A total of Versed 2.0 mg and Fentanyl 100 mcg was administered intravenously. Moderate Sedation Time: 83 minutes. The patient's level of consciousness and vital signs were monitored continuously by radiology nursing  throughout the procedure under my direct supervision. CONTRAST:  50 ML OMNIPAQUE 300 FLUOROSCOPY TIME:  Radiation Exposure Index (as provided by the fluoroscopic device): 99991111 mGy Kerma COMPLICATIONS: None immediate. PROCEDURE: Informed consent was obtained from the patient and her daughter following explanation of the procedure, risks, benefits and alternatives. The patient's daughter understands, agrees and consents for the procedure. All questions were addressed. A time out was performed prior to the initiation of the procedure. Maximal barrier sterile technique utilized including caps, mask, sterile gowns, sterile gloves, large sterile drape, hand hygiene, and chlorhexidine prep. During the procedure local anesthesia was provided with 1% lidocaine. Ultrasound was used to confirm patency of the right common femoral artery. A permanent ultrasound image was saved and recorded. Access of the right common femoral artery was performed under direct ultrasound guidance with a micropuncture set. After obtaining arterial access, a 5 [place] vascular sheath was advanced over a guidewire. A 5 French Cobra catheter was advanced into the abdominal aorta. This was used to selectively catheterize the superior mesenteric artery. Selective arteriography was performed of the SMA. The 5 French catheter was further advanced into the SMA trunk. A Progreat Alpha microcatheter was  advanced through the 5 French catheter and into a second order branch supplying the transverse colon. Selective arteriography was performed in this branch via the microcatheter. The microcatheter was then further advanced into a left-sided branch of the transverse colonic artery supplying the mid to distal transverse colon. Selective arteriography was performed via the microcatheter. The microcatheter was further advanced into an additional branch of this artery and selective arteriography performed. The microcatheter was then further advanced into a further  additional branch of the artery and selective arteriography performed. Attempt was made to further advance the microcatheter in this branch over a guidewire. The microcatheter was then retracted and additional arteriography performed. A slurry of Gel-Foam pledgets was then made and diluted in saline and contrast. Embolization was then performed through the microcatheter at the level of distal transverse colonic arterial supply with diluted Gel-Foam pledgets. Additional arteriography was performed through the microcatheter. Catheters were removed. Fluoroscopic injection of contrast was performed at the level of the arteriotomy via the 5 French sheath to assess level of femoral puncture and patency of the artery prior to closure. Arteriotomy closure was performed using a 6 [place] Angio-Seal device. FINDINGS: The superior mesenteric artery is normally patent. One of the early right-sided branches of the SMA trunk supplies the transverse colon and demonstrates an early bifurcation into proximal and distal branches. This was catheterized and selective arteriography performed. Further selective arteriography was then performed of the left sided branch of this trunk that supplies the mid to distal transverse colon. Selective arteriography further distally in this trunk demonstrates active contrast extravasation from a focal inferior branch along the mesenteric aspect of the distal transverse colon near the splenic flexure. This is in the region of contrast extravasation seen by CT angiography. Attempt to further selectively catheterize the arcade along the mesenteric aspect of the colon at this level to place embolization coils was difficult due to tortuosity and small size of the vessels. Initially, a microcatheter was able to be advanced into the proximal aspect of the arcade but this did result in dissection of the artery with occlusion and the microcatheter had to be retracted. After initial occlusion, the vessel did  spontaneously open back up after a few minutes which was confirmed by arteriography. As sub-selective coil embolization was not possible, decision was made to proceed with Gel-Foam embolization of the distal transverse arterial supply in order to treat the arterial hemorrhage. This resulted in slow flow and cessation of visualization of smaller distal branch vessels. No further active contrast extravasation was seen after embolization. IMPRESSION: 1. Active contrast extravasation in the region of contrast extravasation seen by CT angiography at the level of the distal transverse colon near the splenic flexure from distal arterial supply of the transverse colonic arcade. 2. Gel-Foam embolization was performed of distal transverse colonic arterial supply as coil embolization could not be performed due to dissection of an arterial branch supplying the region of focal bleeding along the mesenteric aspect of distal transverse colonic arterial supply. The dissection did eventually open back up but sub selective coil embolization was not possible. Electronically Signed   By: Aletta Edouard M.D.   On: 08/01/2022 12:04   CT ANGIO GI BLEED  Result Date: 08/01/2022 CLINICAL DATA:  Mid abdominal pain. Bright red rectal bleeding. End-stage renal disease. EXAM: CTA ABDOMEN AND PELVIS WITHOUT AND WITH CONTRAST TECHNIQUE: Multidetector CT imaging of the abdomen and pelvis was performed using the standard protocol during bolus administration of intravenous contrast. Multiplanar reconstructed images and MIPs  were obtained and reviewed to evaluate the vascular anatomy. RADIATION DOSE REDUCTION: This exam was performed according to the departmental dose-optimization program which includes automated exposure control, adjustment of the mA and/or kV according to patient size and/or use of iterative reconstruction technique. CONTRAST:  36m OMNIPAQUE IOHEXOL 350 MG/ML SOLN COMPARISON:  09/07/2020 FINDINGS: VASCULAR Aorta: Atheromatous  wall thickening and calcification of the aorta. No aneurysm or dissection Celiac: Atheromatous plaque at the origin accentuated by median arcuate ligament based on morphology, but without critical stenosis. SMA: Moderate origin stenosis due to calcified plaque with mild poststenotic dilatation. No acute finding. Renals: Symmetrically enhancing renal arteries without high-grade proximal stenosis. IMA: Patent Inflow: Extensive atheromatous calcification. Proximal Outflow: Atheromatous plaque with up to 50% narrowing at the right common iliac artery. Veins: Unremarkable Review of the MIP images confirms the above findings. NON-VASCULAR Lower chest: Cardiomegaly and atherosclerosis. Hazy appearance of markings at the lung bases, favor scarring. Hepatobiliary: Some surface lobulation and caudate lobe enlargement suggested, cirrhosis is conceivable but not definite.Cholelithiasis. No acute inflammation or biliary obstruction seen. Pancreas: Unremarkable. Spleen: Unremarkable. Adrenals/Urinary Tract: Negative adrenals. No hydronephrosis or stone. Severe bilateral renal atrophy in keeping with end-stage renal disease history. Simple bilateral renal cysts measuring up to 11 mm on the right, no follow-up imaging recommended. Unremarkable bladder. Stomach/Bowel: Diverticular colon. Actively intraluminal hemorrhage is seen at a distal transverse diverticulum along the inferior wall, site marked on 6:95. No bowel wall thickening or obstruction. No appendicitis. Lymphatic: Negative for mass or adenopathy Reproductive:Unremarkable for age Other: No ascites or pneumoperitoneum. Musculoskeletal: Extensive spinal degeneration. ER attending made aware via epic chat. IMPRESSION: 1. 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 2. Atherosclerosis with ~ 50% narrowing at the SMA and right common iliac arteries. 3. Additional chronic findings are described above. Electronically Signed   By: JJorje GuildM.D.   On:  08/01/2022 07:28      Signature  -   PLala LundM.D on 08/03/2022 at 10:41 AM   -  To page go to [email or web address]

## 2022-08-04 LAB — CBC WITH DIFFERENTIAL/PLATELET
Abs Immature Granulocytes: 0.03 10*3/uL (ref 0.00–0.07)
Basophils Absolute: 0.1 10*3/uL (ref 0.0–0.1)
Basophils Relative: 1 %
Eosinophils Absolute: 0.2 10*3/uL (ref 0.0–0.5)
Eosinophils Relative: 3 %
HCT: 26.4 % — ABNORMAL LOW (ref 36.0–46.0)
Hemoglobin: 8.8 g/dL — ABNORMAL LOW (ref 12.0–15.0)
Immature Granulocytes: 0 %
Lymphocytes Relative: 18 %
Lymphs Abs: 1.3 10*3/uL (ref 0.7–4.0)
MCH: 28.7 pg (ref 26.0–34.0)
MCHC: 33.3 g/dL (ref 30.0–36.0)
MCV: 86 fL (ref 80.0–100.0)
Monocytes Absolute: 0.7 10*3/uL (ref 0.1–1.0)
Monocytes Relative: 10 %
Neutro Abs: 5.2 10*3/uL (ref 1.7–7.7)
Neutrophils Relative %: 68 %
Platelets: 162 10*3/uL (ref 150–400)
RBC: 3.07 MIL/uL — ABNORMAL LOW (ref 3.87–5.11)
RDW: 14.9 % (ref 11.5–15.5)
WBC: 7.6 10*3/uL (ref 4.0–10.5)
nRBC: 0 % (ref 0.0–0.2)

## 2022-08-04 LAB — RENAL FUNCTION PANEL
Albumin: 3.5 g/dL (ref 3.5–5.0)
Anion gap: 16 — ABNORMAL HIGH (ref 5–15)
BUN: 47 mg/dL — ABNORMAL HIGH (ref 8–23)
CO2: 25 mmol/L (ref 22–32)
Calcium: 8.6 mg/dL — ABNORMAL LOW (ref 8.9–10.3)
Chloride: 92 mmol/L — ABNORMAL LOW (ref 98–111)
Creatinine, Ser: 9.24 mg/dL — ABNORMAL HIGH (ref 0.44–1.00)
GFR, Estimated: 4 mL/min — ABNORMAL LOW (ref >60)
Glucose, Bld: 113 mg/dL — ABNORMAL HIGH (ref 70–99)
Phosphorus: 4.1 mg/dL (ref 2.5–4.6)
Potassium: 4.5 mmol/L (ref 3.5–5.1)
Sodium: 133 mmol/L — ABNORMAL LOW (ref 135–145)

## 2022-08-04 MED ORDER — DOCUSATE SODIUM 100 MG PO CAPS: 100.0000 mg | ORAL_CAPSULE | Freq: Two times a day (BID) | ORAL | 0 refills | Status: AC

## 2022-08-04 NOTE — Progress Notes (Signed)
Physical Therapy Treatment Patient Details Name: Alexa Dougherty MRN: [id number] DOB: [birth year] Today's Date: 08/04/2022   History of Present Illness Pt is a 77 y/o F admitted on 08/01/22 after presenting with c/o 3 episodes of bloody stools. CTA showed active diverticular hemorrhage at the distal transverse colon. Pt underwent IR gel-foam embolization of distal transverse colonic arterial supply. PMH: ESRD on HD MWF, anemia, GERD, HTN, HLD, diverticular bleed 1 year ago    PT Comments    Pt greeted supine in bed and agreeable to session with continued progress towards acute goals. Pt needing grossly supervision assist for transfers and gait without AD support with pt demonstrating stable rise to standing and no overt LOB during ambulation however slightly decreased gait speed appreciated. Pt needing min assist at start of session to come to sitting as pt reaching for this PTAs arm to assist to pull up into sitting from flat bed. Pt able to don socks and pants without assist with no LOB. Anticipate safe discharge, with assist level outlined below, once medically cleared, will continue to follow acutely.    Recommendations for follow up therapy are one component of a multi-disciplinary discharge planning process, led by the attending physician.  Recommendations may be updated based on patient status, additional functional criteria and insurance authorization.  Follow Up Recommendations  Home health PT     Assistance Recommended at Discharge Intermittent Supervision/Assistance  Patient can return home with the following A little help with walking and/or transfers;A little help with bathing/dressing/bathroom;Assistance with cooking/housework;Assist for transportation;Help with stairs or ramp for entrance   Equipment Recommendations  None recommended by PT    Recommendations for Other Services       Precautions / Restrictions Precautions Precautions: Fall Restrictions Weight Bearing  Restrictions: No     Mobility  Bed Mobility Overal bed mobility: Needs Assistance Bed Mobility: Supine to Sit     Supine to sit: Min assist     General bed mobility comments: HOB flat, pt reaching out and holding this PTAs arm to elevate trunk    Transfers Overall transfer level: Needs assistance Equipment used: None Transfers: Sit to/from Stand Sit to Stand: Supervision           General transfer comment: steady rise x4 throughout session, able to complete without UE support    Ambulation/Gait Ambulation/Gait assistance: Supervision Gait Distance (Feet): 165 Feet Assistive device: None Gait Pattern/deviations: Decreased step length - right, Decreased step length - left, Decreased stride length Gait velocity: slightly decr     General Gait Details: no overt LOB noted   Stairs             Wheelchair Mobility    Modified Rankin (Stroke Patients Only)       Balance Overall balance assessment: Needs assistance Sitting-balance support: Feet supported Sitting balance-Leahy Scale: Good Sitting balance - Comments: able to don socks in sitting with figure 4 pattern without LOB   Standing balance support: No upper extremity supported, During functional activity Standing balance-Leahy Scale: Fair Standing balance comment: no UE support during gait                            Cognition Arousal/Alertness: Awake/alert Behavior During Therapy: WFL for tasks assessed/performed Overall Cognitive Status: Within Functional Limits for tasks assessed  Exercises      General Comments        Pertinent Vitals/Pain Pain Assessment Pain Assessment: No/denies pain    Home Living                          Prior Function            PT Goals (current goals can now be found in the care plan section) Acute Rehab PT Goals Patient Stated Goal: go home PT Goal Formulation: With  patient Time For Goal Achievement: 08/16/22 Progress towards PT goals: Progressing toward goals    Frequency    Min 3X/week      PT Plan      Co-evaluation              AM-PAC PT "6 Clicks" Mobility   Outcome Measure  Help needed turning from your back to your side while in a flat bed without using bedrails?: None Help needed moving from lying on your back to sitting on the side of a flat bed without using bedrails?: None Help needed moving to and from a bed to a chair (including a wheelchair)?: A Little Help needed standing up from a chair using your arms (e.g., wheelchair or bedside chair)?: A Little Help needed to walk in hospital room?: A Little Help needed climbing 3-5 steps with a railing? : A Little 6 Click Score: 20    End of Session Equipment Utilized During Treatment: Gait belt Activity Tolerance: Patient tolerated treatment well Patient left: in bed;with call bell/phone within reach;with bed alarm set;with family/visitor present Nurse Communication: Mobility status PT Visit Diagnosis: Muscle weakness (generalized) (M62.81);Unsteadiness on feet (R26.81)     Time: [id number] PT Time Calculation (min) (ACUTE ONLY): 15 min  Charges:  $Therapeutic Activity: 8-22 mins                     Kaedin Hicklin R. PTA Acute Rehabilitation Services Office: [place] 08/04/2022, 10:41 AM

## 2022-08-04 NOTE — Discharge Instructions (Signed)
Follow with Primary MD Bartholome Bill, MD in 7 days   Get CBC, CMP, 2 view Chest X ray -  checked next visit with your primary MD  Activity: As tolerated with Full fall precautions use walker/cane & assistance as needed  Disposition Home  Diet: Renal diet, soft in consistency with feeding assistance and aspiration precautions.  1.5 L fluid restriction per day  Special Instructions: If you have smoked or chewed Tobacco  in the last 2 yrs please stop smoking, stop any regular Alcohol  and or any Recreational drug use.  On your next visit with your primary care physician please Get Medicines reviewed and adjusted.  Please request your Prim.MD to go over all Hospital Tests and Procedure/Radiological results at the follow up, please get all Hospital records sent to your Prim MD by signing hospital release before you go home.  If you experience worsening of your admission symptoms, develop shortness of breath, life threatening emergency, suicidal or homicidal thoughts you must seek medical attention immediately by calling 911 or calling your MD immediately  if symptoms less severe.  You Must read complete instructions/literature along with all the possible adverse reactions/side effects for all the Medicines you take and that have been prescribed to you. Take any new Medicines after you have completely understood and accpet all the possible adverse reactions/side effects.

## 2022-08-04 NOTE — Discharge Summary (Signed)
Alexa Dougherty [id number] DOB: [birth year] DOA: 08/01/2022  PCP: Bartholome Bill, MD  Admit date: 08/01/2022  Discharge date: 08/04/2022  Admitted From: Home   Disposition:  Home   Recommendations for Outpatient Follow-up:   Follow up with PCP in 1-2 weeks  PCP Please obtain BMP/CBC, 2 view CXR in 1week,  (see Discharge instructions)   PCP Please follow up on the following pending results: Monitor CBC closely   Home Health: None   Equipment/Devices: None  Consultations: None  Discharge Condition: Stable    CODE STATUS: Full    Diet Recommendation: Renal-soft consistency diet, 1.5 L fluid restriction per day    Chief Complaint  Patient presents with   Rectal Bleeding     Brief history of present illness from the day of admission and additional interim summary    77 y.o. female with medical history significant of  PMH ESRD on HD MWF , anemia, GERD, Hypertension, hyperlipidemia, history of diverticular bleed one year ago for which she required IR intervention and PRBCS, patient presents to ED with 3 episode of bloody stools that started 2:30 am day of presentation. Patient noted no associated pain, n/v, fall , presyncope, chest pain or sob associated with symptoms.  Her further workup in the ER suggested lower GI bleed likely from diverticular source, she was admitted to the hospital for further care.                                                                     Hospital Course   Acute lower GI bleed causing acute blood loss anemia in a patient with anemia of chronic disease due to ESRD and history of diverticular bleed in the past.  She has been seen both by IR and [facility], currently she has been embolized by IR on 08/01/2022 after which bleeding seems to have subsided, able H&H multiple brown  bowel movements no evidence of ongoing bleed, eager to go home will be discharged home with outpatient follow-up with PCP and GI.   ESRD.  On MWF dialysis schedule.  Renal on board.  Will be dialyzed today after at her outpatient HD facility.  Case discussed with nephrologist today.   GERD.  On PPI.   Hypertension.  Home medications resumed with as needed hydralazine on board.   Dyslipidemia.  On statin continue.    Discharge diagnosis     Principal Problem:   History of GI diverticular bleed Active Problems:   Diverticulosis of colon with hemorrhage   Hematochezia   Acute blood loss anemia   Diverticular hemorrhage    Discharge instructions    Discharge Instructions     Discharge instructions   Complete by: As directed    Follow with Primary MD Bartholome Bill, MD in 7 days  Get CBC, CMP, 2 view Chest X ray -  checked next visit with your primary MD  Activity: As tolerated with Full fall precautions use walker/cane & assistance as needed  Disposition Home  Diet: Renal diet, soft in consistency with feeding assistance and aspiration precautions.  1.5 L fluid restriction per day  Special Instructions: If you have smoked or chewed Tobacco  in the last 2 yrs please stop smoking, stop any regular Alcohol  and or any Recreational drug use.  On your next visit with your primary care physician please Get Medicines reviewed and adjusted.  Please request your Prim.MD to go over all Hospital Tests and Procedure/Radiological results at the follow up, please get all Hospital records sent to your Prim MD by signing hospital release before you go home.  If you experience worsening of your admission symptoms, develop shortness of breath, life threatening emergency, suicidal or homicidal thoughts you must seek medical attention immediately by calling 911 or calling your MD immediately  if symptoms less severe.  You Must read complete instructions/literature along with all the  possible adverse reactions/side effects for all the Medicines you take and that have been prescribed to you. Take any new Medicines after you have completely understood and accpet all the possible adverse reactions/side effects.   Increase activity slowly   Complete by: As directed        Discharge Medications   Allergies as of 08/04/2022   No Known Allergies      Medication List     TAKE these medications    acetaminophen 500 MG tablet Commonly known as: TYLENOL Take 500 mg by mouth every 6 (six) hours as needed for moderate pain or headache.   amLODipine 10 MG tablet Commonly known as: NORVASC Take 10 mg by mouth every evening.   atorvastatin 10 MG tablet Commonly known as: LIPITOR Take 10 mg by mouth every evening.   cinacalcet 60 MG tablet Commonly known as: SENSIPAR Take 60 mg by mouth at bedtime.   dexlansoprazole 60 MG capsule Commonly known as: DEXILANT Take 60 mg by mouth daily.   docusate sodium 100 MG capsule Commonly known as: Colace Take 1 capsule (100 mg total) by mouth 2 (two) times daily.   hydrALAZINE 25 MG tablet Commonly known as: APRESOLINE Take 25 mg by mouth 3 (three) times daily.   lidocaine-prilocaine cream Commonly known as: EMLA Apply 1 application topically See admin instructions. Apply a small amount to access site (AVF) 1 to 2 hours before dialysis. Cover with occlusive dressing (saran warp).   metoprolol tartrate 50 MG tablet Commonly known as: LOPRESSOR Take 1 tablet (50 mg total) by mouth 2 (two) times daily.   multivitamin Tabs tablet Take 1 tablet by mouth daily.   sevelamer carbonate 800 MG tablet Commonly known as: Renvela Take 3 tablets (2,400 mg total) by mouth 3 (three) times daily with meals.         Follow-up Information     Bartholome Bill, MD. Schedule an appointment as soon as possible for a visit in 1 week(s).   Specialty: Family Medicine Contact information: [address] [place] [address] [phone]         Mansouraty, Telford Nab., MD. Schedule an appointment as soon as possible for a visit in 2 week(s).   Specialties: Gastroenterology, Internal Medicine Contact information: [place] [address] [phone]  Major procedures and Radiology Reports - PLEASE review detailed and final reports thoroughly  -        IR Angiogram Visceral Selective  Result Date: 08/01/2022 INDICATION: Lower GI bleed with rectal bleeding. Positive CTA with active contrast extravasation at the level of the distal transverse colon near the splenic flexure. 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 EXAM: 1. ULTRASOUND GUIDANCE FOR VASCULAR ACCESS OF THE RIGHT COMMON FEMORAL ARTERY 2. SELECTIVE ARTERIOGRAPHY OF THE SUPERIOR MESENTERIC ARTERY 3. ADDITIONAL SELECTIVE ARTERIOGRAPHY OF SECOND ORDER BRANCH OF SUPERIOR MESENTERIC ARTERY 4. ADDITIONAL SELECTIVE ARTERIOGRAPHY OF THIRD ORDER TRANSVERSE COLONIC BRANCH OF SUPERIOR MESENTERIC ARTERY 5. ADDITIONAL SELECTIVE ARTERIOGRAPHY OF FOURTH ORDER SUPPLY TO THE TRANSVERSE COLON 6. TRANSCATHETER EMBOLIZATION OF ARTERIAL SUPPLY TO THE TRANSVERSE COLON TO TREAT HEMORRHAGE MEDICATIONS: NONE ANESTHESIA/SEDATION: Moderate (conscious) sedation was employed during this procedure. A total of Versed 2.0 mg and Fentanyl 100 mcg was administered intravenously. Moderate Sedation Time: 83 minutes. The patient's level of consciousness and vital signs were monitored continuously by radiology nursing throughout the procedure under my direct supervision. CONTRAST:  50 ML OMNIPAQUE 300 FLUOROSCOPY TIME:  Radiation Exposure Index (as provided by the fluoroscopic device): 99991111 mGy Kerma COMPLICATIONS: None immediate. PROCEDURE: Informed consent was obtained from the patient and her daughter following explanation of the  procedure, risks, benefits and alternatives. The patient's daughter understands, agrees and consents for the procedure. All questions were addressed. A time out was performed prior to the initiation of the procedure. Maximal barrier sterile technique utilized including caps, mask, sterile gowns, sterile gloves, large sterile drape, hand hygiene, and chlorhexidine prep. During the procedure local anesthesia was provided with 1% lidocaine. Ultrasound was used to confirm patency of the right common femoral artery. A permanent ultrasound image was saved and recorded. Access of the right common femoral artery was performed under direct ultrasound guidance with a micropuncture set. After obtaining arterial access, a 5 [place] vascular sheath was advanced over a guidewire. A 5 French Cobra catheter was advanced into the abdominal aorta. This was used to selectively catheterize the superior mesenteric artery. Selective arteriography was performed of the SMA. The 5 French catheter was further advanced into the SMA trunk. A Progreat Alpha microcatheter was advanced through the 5 French catheter and into a second order branch supplying the transverse colon. Selective arteriography was performed in this branch via the microcatheter. The microcatheter was then further advanced into a left-sided branch of the transverse colonic artery supplying the mid to distal transverse colon. Selective arteriography was performed via the microcatheter. The microcatheter was further advanced into an additional branch of this artery and selective arteriography performed. The microcatheter was then further advanced into a further additional branch of the artery and selective arteriography performed. Attempt was made to further advance the microcatheter in this branch over a guidewire. The microcatheter was then retracted and additional arteriography performed. A slurry of Gel-Foam pledgets was then made and diluted in saline and contrast.  Embolization was then performed through the microcatheter at the level of distal transverse colonic arterial supply with diluted Gel-Foam pledgets. Additional arteriography was performed through the microcatheter. Catheters were removed. Fluoroscopic injection of contrast was performed at the level of the arteriotomy via the 5 French sheath to assess level of femoral puncture and patency of the artery prior to closure. Arteriotomy closure was performed using a 6 [place] Angio-Seal device. FINDINGS: The superior mesenteric artery is normally patent.  One of the early right-sided branches of the SMA trunk supplies the transverse colon and demonstrates an early bifurcation into proximal and distal branches. This was catheterized and selective arteriography performed. Further selective arteriography was then performed of the left sided branch of this trunk that supplies the mid to distal transverse colon. Selective arteriography further distally in this trunk demonstrates active contrast extravasation from a focal inferior branch along the mesenteric aspect of the distal transverse colon near the splenic flexure. This is in the region of contrast extravasation seen by CT angiography. Attempt to further selectively catheterize the arcade along the mesenteric aspect of the colon at this level to place embolization coils was difficult due to tortuosity and small size of the vessels. Initially, a microcatheter was able to be advanced into the proximal aspect of the arcade but this did result in dissection of the artery with occlusion and the microcatheter had to be retracted. After initial occlusion, the vessel did spontaneously open back up after a few minutes which was confirmed by arteriography. As sub-selective coil embolization was not possible, decision was made to proceed with Gel-Foam embolization of the distal transverse arterial supply in order to treat the arterial hemorrhage. This resulted in slow flow and  cessation of visualization of smaller distal branch vessels. No further active contrast extravasation was seen after embolization. IMPRESSION: 1. Active contrast extravasation in the region of contrast extravasation seen by CT angiography at the level of the distal transverse colon near the splenic flexure from distal arterial supply of the transverse colonic arcade. 2. Gel-Foam embolization was performed of distal transverse colonic arterial supply as coil embolization could not be performed due to dissection of an arterial branch supplying the region of focal bleeding along the mesenteric aspect of distal transverse colonic arterial supply. The dissection did eventually open back up but sub selective coil embolization was not possible. Electronically Signed   By: Aletta Edouard M.D.   On: 08/01/2022 12:04   IR US Guide Vasc Access Right  Result Date: 08/01/2022 INDICATION: Lower GI bleed with rectal bleeding. Positive CTA with active contrast extravasation at the level of the distal transverse colon near the splenic flexure. 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 EXAM: 1. ULTRASOUND GUIDANCE FOR VASCULAR ACCESS OF THE RIGHT COMMON FEMORAL ARTERY 2. SELECTIVE ARTERIOGRAPHY OF THE SUPERIOR MESENTERIC ARTERY 3. ADDITIONAL SELECTIVE ARTERIOGRAPHY OF SECOND ORDER BRANCH OF SUPERIOR MESENTERIC ARTERY 4. ADDITIONAL SELECTIVE ARTERIOGRAPHY OF THIRD ORDER TRANSVERSE COLONIC BRANCH OF SUPERIOR MESENTERIC ARTERY 5. ADDITIONAL SELECTIVE ARTERIOGRAPHY OF FOURTH ORDER SUPPLY TO THE TRANSVERSE COLON 6. TRANSCATHETER EMBOLIZATION OF ARTERIAL SUPPLY TO THE TRANSVERSE COLON TO TREAT HEMORRHAGE MEDICATIONS: NONE ANESTHESIA/SEDATION: Moderate (conscious) sedation was employed during this procedure. A total of Versed 2.0 mg and Fentanyl 100 mcg was administered intravenously. Moderate Sedation Time: 83 minutes. The patient's level of  consciousness and vital signs were monitored continuously by radiology nursing throughout the procedure under my direct supervision. CONTRAST:  50 ML OMNIPAQUE 300 FLUOROSCOPY TIME:  Radiation Exposure Index (as provided by the fluoroscopic device): 99991111 mGy Kerma COMPLICATIONS: None immediate. PROCEDURE: Informed consent was obtained from the patient and her daughter following explanation of the procedure, risks, benefits and alternatives. The patient's daughter understands, agrees and consents for the procedure. All questions were addressed. A time out was performed prior to the initiation of the procedure. Maximal barrier sterile technique utilized including caps, mask, sterile gowns, sterile gloves, large  sterile drape, hand hygiene, and chlorhexidine prep. During the procedure local anesthesia was provided with 1% lidocaine. Ultrasound was used to confirm patency of the right common femoral artery. A permanent ultrasound image was saved and recorded. Access of the right common femoral artery was performed under direct ultrasound guidance with a micropuncture set. After obtaining arterial access, a 5 [place] vascular sheath was advanced over a guidewire. A 5 French Cobra catheter was advanced into the abdominal aorta. This was used to selectively catheterize the superior mesenteric artery. Selective arteriography was performed of the SMA. The 5 French catheter was further advanced into the SMA trunk. A Progreat Alpha microcatheter was advanced through the 5 French catheter and into a second order branch supplying the transverse colon. Selective arteriography was performed in this branch via the microcatheter. The microcatheter was then further advanced into a left-sided branch of the transverse colonic artery supplying the mid to distal transverse colon. Selective arteriography was performed via the microcatheter. The microcatheter was further advanced into an additional branch of this artery and selective  arteriography performed. The microcatheter was then further advanced into a further additional branch of the artery and selective arteriography performed. Attempt was made to further advance the microcatheter in this branch over a guidewire. The microcatheter was then retracted and additional arteriography performed. A slurry of Gel-Foam pledgets was then made and diluted in saline and contrast. Embolization was then performed through the microcatheter at the level of distal transverse colonic arterial supply with diluted Gel-Foam pledgets. Additional arteriography was performed through the microcatheter. Catheters were removed. Fluoroscopic injection of contrast was performed at the level of the arteriotomy via the 5 French sheath to assess level of femoral puncture and patency of the artery prior to closure. Arteriotomy closure was performed using a 6 [place] Angio-Seal device. FINDINGS: The superior mesenteric artery is normally patent. One of the early right-sided branches of the SMA trunk supplies the transverse colon and demonstrates an early bifurcation into proximal and distal branches. This was catheterized and selective arteriography performed. Further selective arteriography was then performed of the left sided branch of this trunk that supplies the mid to distal transverse colon. Selective arteriography further distally in this trunk demonstrates active contrast extravasation from a focal inferior branch along the mesenteric aspect of the distal transverse colon near the splenic flexure. This is in the region of contrast extravasation seen by CT angiography. Attempt to further selectively catheterize the arcade along the mesenteric aspect of the colon at this level to place embolization coils was difficult due to tortuosity and small size of the vessels. Initially, a microcatheter was able to be advanced into the proximal aspect of the arcade but this did result in dissection of the artery with occlusion  and the microcatheter had to be retracted. After initial occlusion, the vessel did spontaneously open back up after a few minutes which was confirmed by arteriography. As sub-selective coil embolization was not possible, decision was made to proceed with Gel-Foam embolization of the distal transverse arterial supply in order to treat the arterial hemorrhage. This resulted in slow flow and cessation of visualization of smaller distal branch vessels. No further active contrast extravasation was seen after embolization. IMPRESSION: 1. Active contrast extravasation in the region of contrast extravasation seen by CT angiography at the level of the distal transverse colon near the splenic flexure from distal arterial supply of the transverse colonic arcade. 2. Gel-Foam embolization was performed of distal transverse colonic arterial supply as coil embolization could not  be performed due to dissection of an arterial branch supplying the region of focal bleeding along the mesenteric aspect of distal transverse colonic arterial supply. The dissection did eventually open back up but sub selective coil embolization was not possible. Electronically Signed   By: Aletta Edouard M.D.   On: 08/01/2022 12:04   IR Angiogram Selective Each Additional Vessel  Result Date: 08/01/2022 INDICATION: Lower GI bleed with rectal bleeding. Positive CTA with active contrast extravasation at the level of the distal transverse colon near the splenic flexure. 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 EXAM: 1. ULTRASOUND GUIDANCE FOR VASCULAR ACCESS OF THE RIGHT COMMON FEMORAL ARTERY 2. SELECTIVE ARTERIOGRAPHY OF THE SUPERIOR MESENTERIC ARTERY 3. ADDITIONAL SELECTIVE ARTERIOGRAPHY OF SECOND ORDER BRANCH OF SUPERIOR MESENTERIC ARTERY 4. ADDITIONAL SELECTIVE ARTERIOGRAPHY OF THIRD ORDER TRANSVERSE COLONIC BRANCH OF SUPERIOR MESENTERIC ARTERY 5.  ADDITIONAL SELECTIVE ARTERIOGRAPHY OF FOURTH ORDER SUPPLY TO THE TRANSVERSE COLON 6. TRANSCATHETER EMBOLIZATION OF ARTERIAL SUPPLY TO THE TRANSVERSE COLON TO TREAT HEMORRHAGE MEDICATIONS: NONE ANESTHESIA/SEDATION: Moderate (conscious) sedation was employed during this procedure. A total of Versed 2.0 mg and Fentanyl 100 mcg was administered intravenously. Moderate Sedation Time: 83 minutes. The patient's level of consciousness and vital signs were monitored continuously by radiology nursing throughout the procedure under my direct supervision. CONTRAST:  50 ML OMNIPAQUE 300 FLUOROSCOPY TIME:  Radiation Exposure Index (as provided by the fluoroscopic device): 99991111 mGy Kerma COMPLICATIONS: None immediate. PROCEDURE: Informed consent was obtained from the patient and her daughter following explanation of the procedure, risks, benefits and alternatives. The patient's daughter understands, agrees and consents for the procedure. All questions were addressed. A time out was performed prior to the initiation of the procedure. Maximal barrier sterile technique utilized including caps, mask, sterile gowns, sterile gloves, large sterile drape, hand hygiene, and chlorhexidine prep. During the procedure local anesthesia was provided with 1% lidocaine. Ultrasound was used to confirm patency of the right common femoral artery. A permanent ultrasound image was saved and recorded. Access of the right common femoral artery was performed under direct ultrasound guidance with a micropuncture set. After obtaining arterial access, a 5 [place] vascular sheath was advanced over a guidewire. A 5 French Cobra catheter was advanced into the abdominal aorta. This was used to selectively catheterize the superior mesenteric artery. Selective arteriography was performed of the SMA. The 5 French catheter was further advanced into the SMA trunk. A Progreat Alpha microcatheter was advanced through the 5 French catheter and into a second order branch  supplying the transverse colon. Selective arteriography was performed in this branch via the microcatheter. The microcatheter was then further advanced into a left-sided branch of the transverse colonic artery supplying the mid to distal transverse colon. Selective arteriography was performed via the microcatheter. The microcatheter was further advanced into an additional branch of this artery and selective arteriography performed. The microcatheter was then further advanced into a further additional branch of the artery and selective arteriography performed. Attempt was made to further advance the microcatheter in this branch over a guidewire. The microcatheter was then retracted and additional arteriography performed. A slurry of Gel-Foam pledgets was then made and diluted in saline and contrast. Embolization was then performed through the microcatheter at the level of distal transverse colonic arterial supply with diluted Gel-Foam pledgets. Additional arteriography was performed through the microcatheter. Catheters were removed. Fluoroscopic injection of contrast was performed at the level of the arteriotomy via  the 5 French sheath to assess level of femoral puncture and patency of the artery prior to closure. Arteriotomy closure was performed using a 6 [place] Angio-Seal device. FINDINGS: The superior mesenteric artery is normally patent. One of the early right-sided branches of the SMA trunk supplies the transverse colon and demonstrates an early bifurcation into proximal and distal branches. This was catheterized and selective arteriography performed. Further selective arteriography was then performed of the left sided branch of this trunk that supplies the mid to distal transverse colon. Selective arteriography further distally in this trunk demonstrates active contrast extravasation from a focal inferior branch along the mesenteric aspect of the distal transverse colon near the splenic flexure. This is in the  region of contrast extravasation seen by CT angiography. Attempt to further selectively catheterize the arcade along the mesenteric aspect of the colon at this level to place embolization coils was difficult due to tortuosity and small size of the vessels. Initially, a microcatheter was able to be advanced into the proximal aspect of the arcade but this did result in dissection of the artery with occlusion and the microcatheter had to be retracted. After initial occlusion, the vessel did spontaneously open back up after a few minutes which was confirmed by arteriography. As sub-selective coil embolization was not possible, decision was made to proceed with Gel-Foam embolization of the distal transverse arterial supply in order to treat the arterial hemorrhage. This resulted in slow flow and cessation of visualization of smaller distal branch vessels. No further active contrast extravasation was seen after embolization. IMPRESSION: 1. Active contrast extravasation in the region of contrast extravasation seen by CT angiography at the level of the distal transverse colon near the splenic flexure from distal arterial supply of the transverse colonic arcade. 2. Gel-Foam embolization was performed of distal transverse colonic arterial supply as coil embolization could not be performed due to dissection of an arterial branch supplying the region of focal bleeding along the mesenteric aspect of distal transverse colonic arterial supply. The dissection did eventually open back up but sub selective coil embolization was not possible. Electronically Signed   By: Aletta Edouard M.D.   On: 08/01/2022 12:04   IR Angiogram Selective Each Additional Vessel  Result Date: 08/01/2022 INDICATION: Lower GI bleed with rectal bleeding. Positive CTA with active contrast extravasation at the level of the distal transverse colon near the splenic flexure. History of prior transcatheter embolization for diverticular bleed at the level of  the ascending colon near the hepatic flexure in 2022. EXAM: 1. ULTRASOUND GUIDANCE FOR VASCULAR ACCESS OF THE RIGHT COMMON FEMORAL ARTERY 2. SELECTIVE ARTERIOGRAPHY OF THE SUPERIOR MESENTERIC ARTERY 3. ADDITIONAL SELECTIVE ARTERIOGRAPHY OF SECOND ORDER BRANCH OF SUPERIOR MESENTERIC ARTERY 4. ADDITIONAL SELECTIVE ARTERIOGRAPHY OF THIRD ORDER TRANSVERSE COLONIC BRANCH OF SUPERIOR MESENTERIC ARTERY 5. ADDITIONAL SELECTIVE ARTERIOGRAPHY OF FOURTH ORDER SUPPLY TO THE TRANSVERSE COLON 6. TRANSCATHETER EMBOLIZATION OF ARTERIAL SUPPLY TO THE TRANSVERSE COLON TO TREAT HEMORRHAGE MEDICATIONS: NONE ANESTHESIA/SEDATION: Moderate (conscious) sedation was employed during this procedure. A total of Versed 2.0 mg and Fentanyl 100 mcg was administered intravenously. Moderate Sedation Time: 83 minutes. The patient's level of consciousness and vital signs were monitored continuously by radiology nursing throughout the procedure under my direct supervision. CONTRAST:  50 ML OMNIPAQUE 300 FLUOROSCOPY TIME:  Radiation Exposure Index (as provided by the fluoroscopic device): 99991111 mGy Kerma COMPLICATIONS: None immediate. PROCEDURE: Informed consent was obtained from the patient and her daughter following explanation of the procedure, risks, benefits and alternatives. The patient's daughter  understands, agrees and consents for the procedure. All questions were addressed. A time out was performed prior to the initiation of the procedure. Maximal barrier sterile technique utilized including caps, mask, sterile gowns, sterile gloves, large sterile drape, hand hygiene, and chlorhexidine prep. During the procedure local anesthesia was provided with 1% lidocaine. Ultrasound was used to confirm patency of the right common femoral artery. A permanent ultrasound image was saved and recorded. Access of the right common femoral artery was performed under direct ultrasound guidance with a micropuncture set. After obtaining arterial access, a 5 [place]  vascular sheath was advanced over a guidewire. A 5 French Cobra catheter was advanced into the abdominal aorta. This was used to selectively catheterize the superior mesenteric artery. Selective arteriography was performed of the SMA. The 5 French catheter was further advanced into the SMA trunk. A Progreat Alpha microcatheter was advanced through the 5 French catheter and into a second order branch supplying the transverse colon. Selective arteriography was performed in this branch via the microcatheter. The microcatheter was then further advanced into a left-sided branch of the transverse colonic artery supplying the mid to distal transverse colon. Selective arteriography was performed via the microcatheter. The microcatheter was further advanced into an additional branch of this artery and selective arteriography performed. The microcatheter was then further advanced into a further additional branch of the artery and selective arteriography performed. Attempt was made to further advance the microcatheter in this branch over a guidewire. The microcatheter was then retracted and additional arteriography performed. A slurry of Gel-Foam pledgets was then made and diluted in saline and contrast. Embolization was then performed through the microcatheter at the level of distal transverse colonic arterial supply with diluted Gel-Foam pledgets. Additional arteriography was performed through the microcatheter. Catheters were removed. Fluoroscopic injection of contrast was performed at the level of the arteriotomy via the 5 French sheath to assess level of femoral puncture and patency of the artery prior to closure. Arteriotomy closure was performed using a 6 [place] Angio-Seal device. FINDINGS: The superior mesenteric artery is normally patent. One of the early right-sided branches of the SMA trunk supplies the transverse colon and demonstrates an early bifurcation into proximal and distal branches. This was catheterized  and selective arteriography performed. Further selective arteriography was then performed of the left sided branch of this trunk that supplies the mid to distal transverse colon. Selective arteriography further distally in this trunk demonstrates active contrast extravasation from a focal inferior branch along the mesenteric aspect of the distal transverse colon near the splenic flexure. This is in the region of contrast extravasation seen by CT angiography. Attempt to further selectively catheterize the arcade along the mesenteric aspect of the colon at this level to place embolization coils was difficult due to tortuosity and small size of the vessels. Initially, a microcatheter was able to be advanced into the proximal aspect of the arcade but this did result in dissection of the artery with occlusion and the microcatheter had to be retracted. After initial occlusion, the vessel did spontaneously open back up after a few minutes which was confirmed by arteriography. As sub-selective coil embolization was not possible, decision was made to proceed with Gel-Foam embolization of the distal transverse arterial supply in order to treat the arterial hemorrhage. This resulted in slow flow and cessation of visualization of smaller distal branch vessels. No further active contrast extravasation was seen after embolization. IMPRESSION: 1. Active contrast extravasation in the region of contrast extravasation seen by CT angiography at  the level of the distal transverse colon near the splenic flexure from distal arterial supply of the transverse colonic arcade. 2. Gel-Foam embolization was performed of distal transverse colonic arterial supply as coil embolization could not be performed due to dissection of an arterial branch supplying the region of focal bleeding along the mesenteric aspect of distal transverse colonic arterial supply. The dissection did eventually open back up but sub selective coil embolization was not  possible. Electronically Signed   By: Aletta Edouard M.D.   On: 08/01/2022 12:04   IR Angiogram Selective Each Additional Vessel  Result Date: 08/01/2022 INDICATION: Lower GI bleed with rectal bleeding. Positive CTA with active contrast extravasation at the level of the distal transverse colon near the splenic flexure. 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 EXAM: 1. ULTRASOUND GUIDANCE FOR VASCULAR ACCESS OF THE RIGHT COMMON FEMORAL ARTERY 2. SELECTIVE ARTERIOGRAPHY OF THE SUPERIOR MESENTERIC ARTERY 3. ADDITIONAL SELECTIVE ARTERIOGRAPHY OF SECOND ORDER BRANCH OF SUPERIOR MESENTERIC ARTERY 4. ADDITIONAL SELECTIVE ARTERIOGRAPHY OF THIRD ORDER TRANSVERSE COLONIC BRANCH OF SUPERIOR MESENTERIC ARTERY 5. ADDITIONAL SELECTIVE ARTERIOGRAPHY OF FOURTH ORDER SUPPLY TO THE TRANSVERSE COLON 6. TRANSCATHETER EMBOLIZATION OF ARTERIAL SUPPLY TO THE TRANSVERSE COLON TO TREAT HEMORRHAGE MEDICATIONS: NONE ANESTHESIA/SEDATION: Moderate (conscious) sedation was employed during this procedure. A total of Versed 2.0 mg and Fentanyl 100 mcg was administered intravenously. Moderate Sedation Time: 83 minutes. The patient's level of consciousness and vital signs were monitored continuously by radiology nursing throughout the procedure under my direct supervision. CONTRAST:  50 ML OMNIPAQUE 300 FLUOROSCOPY TIME:  Radiation Exposure Index (as provided by the fluoroscopic device): 99991111 mGy Kerma COMPLICATIONS: None immediate. PROCEDURE: Informed consent was obtained from the patient and her daughter following explanation of the procedure, risks, benefits and alternatives. The patient's daughter understands, agrees and consents for the procedure. All questions were addressed. A time out was performed prior to the initiation of the procedure. Maximal barrier sterile technique utilized including caps, mask, sterile gowns, sterile gloves, large  sterile drape, hand hygiene, and chlorhexidine prep. During the procedure local anesthesia was provided with 1% lidocaine. Ultrasound was used to confirm patency of the right common femoral artery. A permanent ultrasound image was saved and recorded. Access of the right common femoral artery was performed under direct ultrasound guidance with a micropuncture set. After obtaining arterial access, a 5 [place] vascular sheath was advanced over a guidewire. A 5 French Cobra catheter was advanced into the abdominal aorta. This was used to selectively catheterize the superior mesenteric artery. Selective arteriography was performed of the SMA. The 5 French catheter was further advanced into the SMA trunk. A Progreat Alpha microcatheter was advanced through the 5 French catheter and into a second order branch supplying the transverse colon. Selective arteriography was performed in this branch via the microcatheter. The microcatheter was then further advanced into a left-sided branch of the transverse colonic artery supplying the mid to distal transverse colon. Selective arteriography was performed via the microcatheter. The microcatheter was further advanced into an additional branch of this artery and selective arteriography performed. The microcatheter was then further advanced into a further additional branch of the artery and selective arteriography performed. Attempt was made to further advance the microcatheter in this branch over a guidewire. The microcatheter was then retracted and additional arteriography performed. A slurry of Gel-Foam pledgets was then made and diluted in saline and contrast. Embolization was then performed through the microcatheter  at the level of distal transverse colonic arterial supply with diluted Gel-Foam pledgets. Additional arteriography was performed through the microcatheter. Catheters were removed. Fluoroscopic injection of contrast was performed at the level of the arteriotomy via the  5 French sheath to assess level of femoral puncture and patency of the artery prior to closure. Arteriotomy closure was performed using a 6 [place] Angio-Seal device. FINDINGS: The superior mesenteric artery is normally patent. One of the early right-sided branches of the SMA trunk supplies the transverse colon and demonstrates an early bifurcation into proximal and distal branches. This was catheterized and selective arteriography performed. Further selective arteriography was then performed of the left sided branch of this trunk that supplies the mid to distal transverse colon. Selective arteriography further distally in this trunk demonstrates active contrast extravasation from a focal inferior branch along the mesenteric aspect of the distal transverse colon near the splenic flexure. This is in the region of contrast extravasation seen by CT angiography. Attempt to further selectively catheterize the arcade along the mesenteric aspect of the colon at this level to place embolization coils was difficult due to tortuosity and small size of the vessels. Initially, a microcatheter was able to be advanced into the proximal aspect of the arcade but this did result in dissection of the artery with occlusion and the microcatheter had to be retracted. After initial occlusion, the vessel did spontaneously open back up after a few minutes which was confirmed by arteriography. As sub-selective coil embolization was not possible, decision was made to proceed with Gel-Foam embolization of the distal transverse arterial supply in order to treat the arterial hemorrhage. This resulted in slow flow and cessation of visualization of smaller distal branch vessels. No further active contrast extravasation was seen after embolization. IMPRESSION: 1. Active contrast extravasation in the region of contrast extravasation seen by CT angiography at the level of the distal transverse colon near the splenic flexure from distal arterial  supply of the transverse colonic arcade. 2. Gel-Foam embolization was performed of distal transverse colonic arterial supply as coil embolization could not be performed due to dissection of an arterial branch supplying the region of focal bleeding along the mesenteric aspect of distal transverse colonic arterial supply. The dissection did eventually open back up but sub selective coil embolization was not possible. Electronically Signed   By: Aletta Edouard M.D.   On: 08/01/2022 12:04   IR EMBO ART  VEN HEMORR LYMPH EXTRAV  INC GUIDE ROADMAPPING  Result Date: 08/01/2022 INDICATION: Lower GI bleed with rectal bleeding. Positive CTA with active contrast extravasation at the level of the distal transverse colon near the splenic flexure. 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 EXAM: 1. ULTRASOUND GUIDANCE FOR VASCULAR ACCESS OF THE RIGHT COMMON FEMORAL ARTERY 2. SELECTIVE ARTERIOGRAPHY OF THE SUPERIOR MESENTERIC ARTERY 3. ADDITIONAL SELECTIVE ARTERIOGRAPHY OF SECOND ORDER BRANCH OF SUPERIOR MESENTERIC ARTERY 4. ADDITIONAL SELECTIVE ARTERIOGRAPHY OF THIRD ORDER TRANSVERSE COLONIC BRANCH OF SUPERIOR MESENTERIC ARTERY 5. ADDITIONAL SELECTIVE ARTERIOGRAPHY OF FOURTH ORDER SUPPLY TO THE TRANSVERSE COLON 6. TRANSCATHETER EMBOLIZATION OF ARTERIAL SUPPLY TO THE TRANSVERSE COLON TO TREAT HEMORRHAGE MEDICATIONS: NONE ANESTHESIA/SEDATION: Moderate (conscious) sedation was employed during this procedure. A total of Versed 2.0 mg and Fentanyl 100 mcg was administered intravenously. Moderate Sedation Time: 83 minutes. The patient's level of consciousness and vital signs were monitored continuously by radiology nursing throughout the procedure under my direct supervision. CONTRAST:  50 ML OMNIPAQUE  300 FLUOROSCOPY TIME:  Radiation Exposure Index (as provided by the fluoroscopic device): 99991111 mGy Kerma COMPLICATIONS: None immediate.  PROCEDURE: Informed consent was obtained from the patient and her daughter following explanation of the procedure, risks, benefits and alternatives. The patient's daughter understands, agrees and consents for the procedure. All questions were addressed. A time out was performed prior to the initiation of the procedure. Maximal barrier sterile technique utilized including caps, mask, sterile gowns, sterile gloves, large sterile drape, hand hygiene, and chlorhexidine prep. During the procedure local anesthesia was provided with 1% lidocaine. Ultrasound was used to confirm patency of the right common femoral artery. A permanent ultrasound image was saved and recorded. Access of the right common femoral artery was performed under direct ultrasound guidance with a micropuncture set. After obtaining arterial access, a 5 [place] vascular sheath was advanced over a guidewire. A 5 French Cobra catheter was advanced into the abdominal aorta. This was used to selectively catheterize the superior mesenteric artery. Selective arteriography was performed of the SMA. The 5 French catheter was further advanced into the SMA trunk. A Progreat Alpha microcatheter was advanced through the 5 French catheter and into a second order branch supplying the transverse colon. Selective arteriography was performed in this branch via the microcatheter. The microcatheter was then further advanced into a left-sided branch of the transverse colonic artery supplying the mid to distal transverse colon. Selective arteriography was performed via the microcatheter. The microcatheter was further advanced into an additional branch of this artery and selective arteriography performed. The microcatheter was then further advanced into a further additional branch of the artery and selective arteriography performed. Attempt was made to further advance the microcatheter in this branch over a guidewire. The microcatheter was then retracted and additional  arteriography performed. A slurry of Gel-Foam pledgets was then made and diluted in saline and contrast. Embolization was then performed through the microcatheter at the level of distal transverse colonic arterial supply with diluted Gel-Foam pledgets. Additional arteriography was performed through the microcatheter. Catheters were removed. Fluoroscopic injection of contrast was performed at the level of the arteriotomy via the 5 French sheath to assess level of femoral puncture and patency of the artery prior to closure. Arteriotomy closure was performed using a 6 [place] Angio-Seal device. FINDINGS: The superior mesenteric artery is normally patent. One of the early right-sided branches of the SMA trunk supplies the transverse colon and demonstrates an early bifurcation into proximal and distal branches. This was catheterized and selective arteriography performed. Further selective arteriography was then performed of the left sided branch of this trunk that supplies the mid to distal transverse colon. Selective arteriography further distally in this trunk demonstrates active contrast extravasation from a focal inferior branch along the mesenteric aspect of the distal transverse colon near the splenic flexure. This is in the region of contrast extravasation seen by CT angiography. Attempt to further selectively catheterize the arcade along the mesenteric aspect of the colon at this level to place embolization coils was difficult due to tortuosity and small size of the vessels. Initially, a microcatheter was able to be advanced into the proximal aspect of the arcade but this did result in dissection of the artery with occlusion and the microcatheter had to be retracted. After initial occlusion, the vessel did spontaneously open back up after a few minutes which was confirmed by arteriography. As sub-selective coil embolization was not possible, decision was made to proceed with Gel-Foam embolization of the distal  transverse arterial supply in order to  treat the arterial hemorrhage. This resulted in slow flow and cessation of visualization of smaller distal branch vessels. No further active contrast extravasation was seen after embolization. IMPRESSION: 1. Active contrast extravasation in the region of contrast extravasation seen by CT angiography at the level of the distal transverse colon near the splenic flexure from distal arterial supply of the transverse colonic arcade. 2. Gel-Foam embolization was performed of distal transverse colonic arterial supply as coil embolization could not be performed due to dissection of an arterial branch supplying the region of focal bleeding along the mesenteric aspect of distal transverse colonic arterial supply. The dissection did eventually open back up but sub selective coil embolization was not possible. Electronically Signed   By: Aletta Edouard M.D.   On: 08/01/2022 12:04   CT ANGIO GI BLEED  Result Date: 08/01/2022 CLINICAL DATA:  Mid abdominal pain. Bright red rectal bleeding. End-stage renal disease. EXAM: CTA ABDOMEN AND PELVIS WITHOUT AND WITH CONTRAST TECHNIQUE: Multidetector CT imaging of the abdomen and pelvis was performed using the standard protocol during bolus administration of intravenous contrast. Multiplanar reconstructed images and MIPs were obtained and reviewed to evaluate the vascular anatomy. RADIATION DOSE REDUCTION: This exam was performed according to the departmental dose-optimization program which includes automated exposure control, adjustment of the mA and/or kV according to patient size and/or use of iterative reconstruction technique. CONTRAST:  71mL OMNIPAQUE IOHEXOL 350 MG/ML SOLN COMPARISON:  09/07/2020 FINDINGS: VASCULAR Aorta: Atheromatous wall thickening and calcification of the aorta. No aneurysm or dissection Celiac: Atheromatous plaque at the origin accentuated by median arcuate ligament based on morphology, but without critical stenosis.  SMA: Moderate origin stenosis due to calcified plaque with mild poststenotic dilatation. No acute finding. Renals: Symmetrically enhancing renal arteries without high-grade proximal stenosis. IMA: Patent Inflow: Extensive atheromatous calcification. Proximal Outflow: Atheromatous plaque with up to 50% narrowing at the right common iliac artery. Veins: Unremarkable Review of the MIP images confirms the above findings. NON-VASCULAR Lower chest: Cardiomegaly and atherosclerosis. Hazy appearance of markings at the lung bases, favor scarring. Hepatobiliary: Some surface lobulation and caudate lobe enlargement suggested, cirrhosis is conceivable but not definite.Cholelithiasis. No acute inflammation or biliary obstruction seen. Pancreas: Unremarkable. Spleen: Unremarkable. Adrenals/Urinary Tract: Negative adrenals. No hydronephrosis or stone. Severe bilateral renal atrophy in keeping with end-stage renal disease history. Simple bilateral renal cysts measuring up to 11 mm on the right, no follow-up imaging recommended. Unremarkable bladder. Stomach/Bowel: Diverticular colon. Actively intraluminal hemorrhage is seen at a distal transverse diverticulum along the inferior wall, site marked on 6:95. No bowel wall thickening or obstruction. No appendicitis. Lymphatic: Negative for mass or adenopathy Reproductive:Unremarkable for age Other: No ascites or pneumoperitoneum. Musculoskeletal: Extensive spinal degeneration. ER attending made aware via epic chat. IMPRESSION: 1. 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 2. Atherosclerosis with ~ 50% narrowing at the SMA and right common iliac arteries. 3. Additional chronic findings are described above. Electronically Signed   By: Jorje Guild M.D.   On: 08/01/2022 07:28    Micro Results    No results found for this or any previous visit (from the past 240 hour(s)).  Today   Subjective    Annalysa Billingsly today has no headache,no chest abdominal  pain,no new weakness tingling or numbness, feels much better wants to go home today.    Objective   Blood pressure (!) 164/59, pulse 74, temperature 98.3 F (36.8 C), temperature source Oral, resp. rate 18, height 5' (1.524 m), weight 66.9 kg, SpO2 95 %.  Intake/Output Summary (Last 24 hours) at 08/04/2022 0943 Last data filed at 08/04/2022 0546 Gross per 24 hour  Intake 320 ml  Output 0 ml  Net 320 ml    Exam  Awake Alert, No new F.N deficits,    [email or web address].AT,PERRAL Supple Neck,   Symmetrical Chest wall movement, Good air movement bilaterally, CTAB RRR,No Gallops,   +ve B.Sounds, Abd Soft, Non tender,  No Cyanosis, Clubbing or edema    Data Review   Recent Labs  Lab 08/01/22 0605 08/01/22 0852 08/02/22 0842 08/02/22 1557 08/03/22 0221 08/03/22 1206 08/04/22 0819  WBC 7.6   < > 6.6 7.3 7.1 8.0 7.6  HGB 10.3*   < > 8.6* 8.8* 8.1* 8.8* 8.8*  HCT 32.9*   < > 26.3* 26.1* 25.0* 26.3* 26.4*  PLT 166   < > 152 147* 141* 157 162  MCV 89.4   < > 87.1 85.6 87.7 86.5 86.0  MCH 28.0   < > 28.5 28.9 28.4 28.9 28.7  MCHC 31.3   < > 32.7 33.7 32.4 33.5 33.3  RDW 15.1   < > 15.3 15.0 15.1 15.1 14.9  LYMPHSABS 0.9  --   --   --  1.1  --  1.3  MONOABS 0.7  --   --   --  0.9  --  0.7  EOSABS 0.2  --   --   --  0.2  --  0.2  BASOSABS 0.1  --   --   --  0.1  --  0.1   < > = values in this interval not displayed.    Recent Labs  Lab 08/01/22 0448 08/01/22 0605 08/01/22 0852 08/02/22 0312 08/04/22 0819  NA 134*  --   --  137 133*  K 5.3*  --  4.7 4.8 4.5  CL 94*  --   --  97* 92*  CO2 28  --   --  28 25  ANIONGAP 12  --   --  12 16*  GLUCOSE 108*  --   --  83 113*  BUN 37*  --   --  45* 47*  CREATININE 6.00*  --   --  8.33* 9.24*  AST 42*  --   --  22  --   ALT 18  --   --  14  --   ALKPHOS 121  --   --  109  --   BILITOT 0.9  --   --  0.7  --   ALBUMIN 3.9  --   --  3.2* 3.5  INR  --  1.0  --  1.1  --   TSH  --  1.619  --   --   --   HGBA1C  --   --  4.7*  --   --    CALCIUM 8.6*  --   --  8.5* 8.6*     Total Time in preparing paper work, data evaluation and todays exam - 35 minutes  Signature  -    Lala Lund M.D on 08/04/2022 at 9:43 AM   -  To page go to [email or web address]

## 2022-08-04 NOTE — Progress Notes (Signed)
[facility] Progress Note  Name: Alexa Dougherty MRN: [id number] DOB: [birth year]  Chief Complaint:  Blood per rectum   Subjective:  Last HD on 3/13 with 0.7 kg UF - treatment was ended early due to hypotension and received minibolus 200 mL NS x 2.  She feels well.  Spoke with patient as well as her daughter via speakerphone.  They are comfortable with HD outpatient today after discharge and her daughter is able to transport her.   Review of systems:    Denies shortness of breath or chest pain  Denies n/v Had another normal BM    --------- Background on consult:  Alexa Dougherty is a 77 y.o. female with a history of ESRD on HD, HTN, and gastritis who presented to [place] with bright red blood per rectum.  GI was consulted.  She underwent gelfoam embolization earlier today with IR; per IR note no further active bleeding after embolization.  Nephrology was consulted.  The patient is to be transferred to [facility] as she is ESRD.  Note that she had a diverticular bleed a year ago which required IR intervention per charting.  She speaks [place] and this is not offered on the video interpreter service so she asked if she could call her daughter.  She is feeling better but tired and hungry.  She states that she had a BM after the procedure and that now she feels less urgency; she wasn't able to see if there was blood in it.      Intake/Output Summary (Last 24 hours) at 08/04/2022 0926 Last data filed at 08/04/2022 0546 Gross per 24 hour  Intake 320 ml  Output 0 ml  Net 320 ml    Vitals:  Vitals:   08/03/22 2038 08/04/22 0000 08/04/22 0400 08/04/22 0700  BP: (!) 179/73 (!) 142/59 (!) 189/67 (!) 164/59  Pulse: 84 67 68 74  Resp: 12 18 20 18   Temp: 98.4 F (36.9 C) 98.3 F (36.8 C) 98.3 F (36.8 C)   TempSrc: Oral Oral Oral   SpO2: 93% 97% 97% 95%  Weight:      Height:         Physical Exam:    General adult female in bed in no acute distress HEENT normocephalic  atraumatic extraocular movements intact sclera anicteric Neck supple trachea midline Lungs clear to auscultation bilaterally normal work of breathing at rest  Heart S1S2 no rub Abdomen soft nontender nondistended Extremities no edema  Psych normal mood and affect Access LUE AVF bruit and thrill    Medications reviewed   Labs:     Latest Ref Rng & Units 08/04/2022    8:19 AM 08/02/2022    3:12 AM 08/01/2022    8:52 AM  BMP  Glucose 70 - 99 mg/dL 113  83    BUN 8 - 23 mg/dL 47  45    Creatinine 0.44 - 1.00 mg/dL 9.24  8.33    Sodium 135 - 145 mmol/L 133  137    Potassium 3.5 - 5.1 mmol/L 4.5  4.8  4.7   Chloride 98 - 111 mmol/L 92  97    CO2 22 - 32 mmol/L 25  28    Calcium 8.9 - 10.3 mg/dL 8.6  8.5     Outpatient HD orders: [facility]  3.75hrs MWF 400/500 edw 65.7kg 2K 2Ca LU AVF No heparin Venofer 100mg  qHD x10 No current ESA - last mircera 30mcg on 06/19/22 Hectorol 61mcg qHD   Sensipar 60mg  qd  Assessment/Plan:   # Acute GI bleed - Appreciate IR and GI  - s/p gelfoam embolization - diet per primary team    # ESRD  - HD per MWF schedule    # HTN  - continue home regimen  - optimize volume with HD    # Anemia of acute blood loss as well as 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  Appreciate IR and GI - on aranesp 100 mcg every Wednesday (got dose 3/13) and would need ESA as an outpatient with HD for now     # Secondary hyperparathyroidism/metabolic bone disease - hyperphosphatemia - on renvela  - continue hectorol (at HD outpatient) and sensipar (appears given at home)   Disposition - per primary team discretion.  Team reports they are discharging her today.  She is going to be discharged home to dialyze at her outpatient HD unit.  Her daughter is aware and is coming to take her.  They are aware to be at her HD unit outpatient by 11:30 am for an 11:45 chair time.   Claudia Desanctis, MD 08/04/2022 9:50 AM

## 2022-08-04 NOTE — Progress Notes (Addendum)
Case discussed with nephrologist this morning. Pt stable for d/c and stable for out-pt HD either today or tomorrow. Contacted [facility] and spoke to [place], Agricultural consultant. Clinic can treat pt today or tomorrow (11:30 arrival for 11:45 chair time). Dr Royce Macadamia spoke to pt and pt's daughter regarding appt times and family prefers out-pt HD appt for today. Pt will d/c this morning and proceed to out-pt HD clinic. Family to transport. Renal NP aware of plan and to send orders to clinic. Spoke to [place], Agricultural consultant to make her aware that pt will proceed to clinic this morning after d/c for HD today.   Melven Sartorius Renal Navigator [phone]

## 2022-08-05 ENCOUNTER — Telehealth: Payer: Self-pay | Admitting: Nurse Practitioner

## 2022-08-05 NOTE — Telephone Encounter (Signed)
Transition of Care - Initial Contact from Inpatient Facility  Date of discharge: 08/04/2022 Date of contact: 08/05/2022  Method: Phone Spoke [phone]   Patient contacted to discuss transition of care from recent inpatient hospitalization. Patient was admitted to [facility] from 03/12-03/15/2024 with discharge diagnosis of LGIB  The discharge medication list was reviewed. Patient understands the changes and has no concerns.   Patient will return to his/her outpatient HD unit on: 08/07/2022  No other concerns at this time.

## 2023-04-03 ENCOUNTER — Encounter: Payer: Self-pay | Admitting: Internal Medicine

## 2023-10-12 IMAGING — DX DG CHEST 1V PORT
1 series · 1 of 1 positions shown · non-contrast
Comparison: Chest radiograph dated 11/15/2020.

CLINICAL DATA: Shortness of breath

EXAM:
PORTABLE CHEST 1 VIEW

[chest ap]
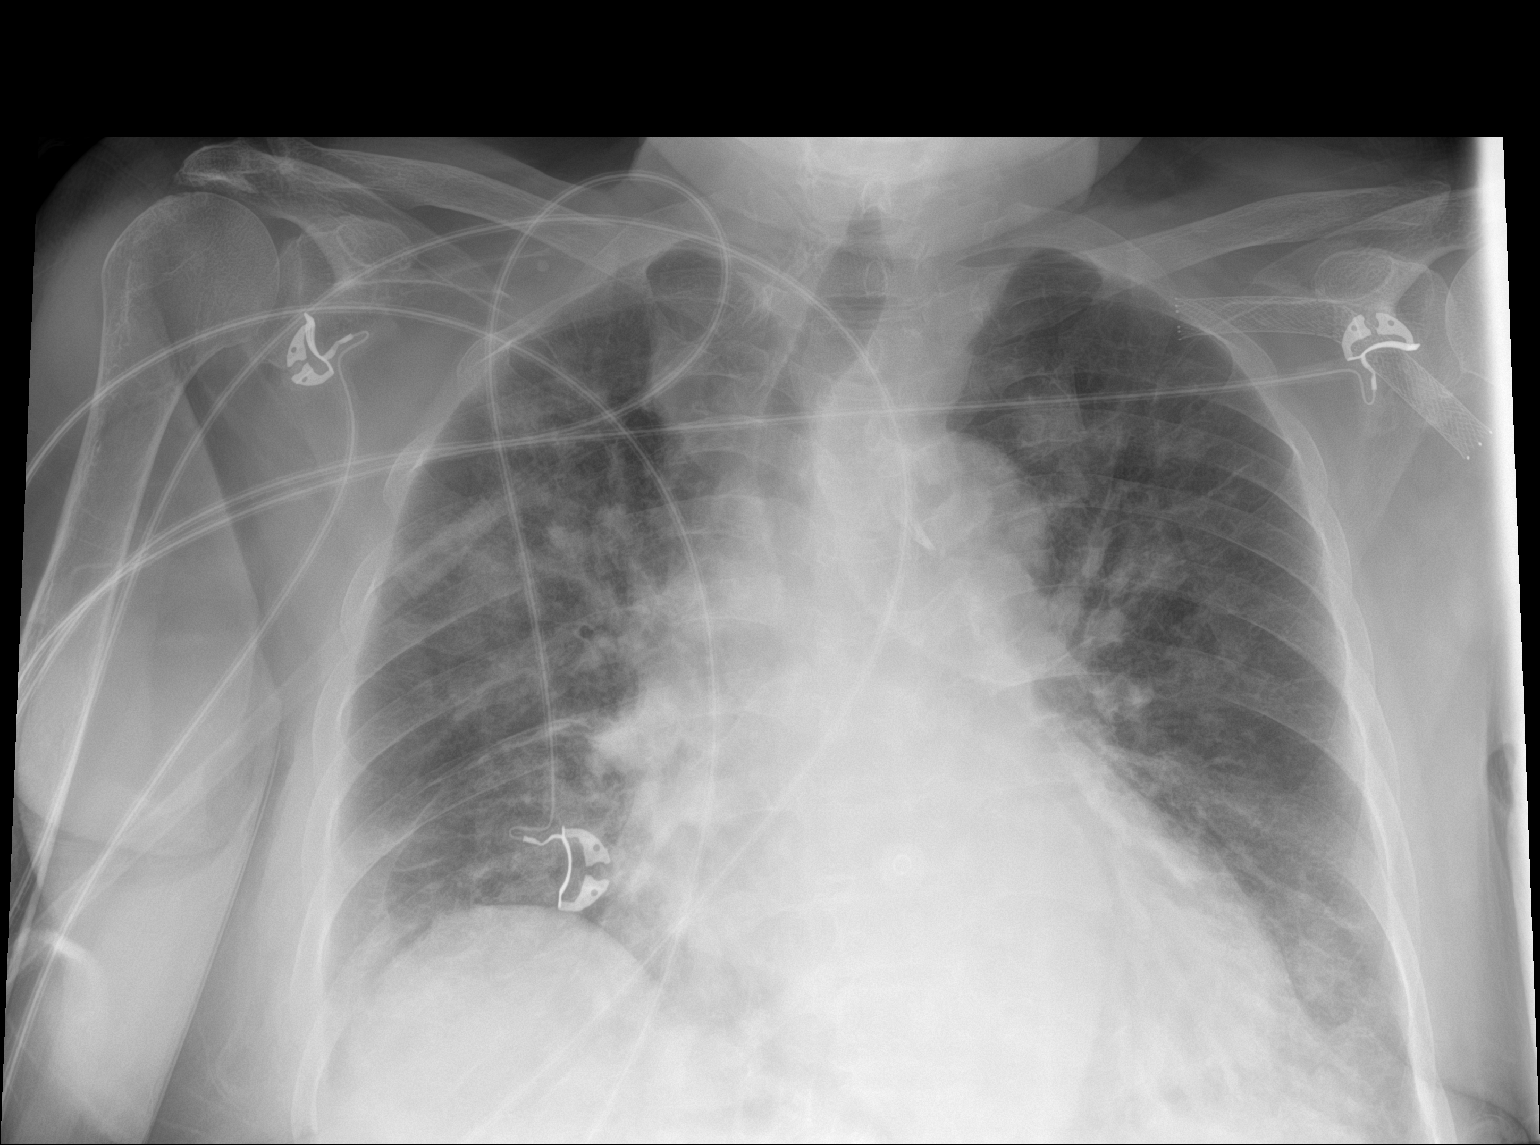

[1 of 1 positions shown; findings below may reference images not displayed]

FINDINGS: The heart is enlarged. Vascular calcifications are seen in the
aortic arch. Moderate patchy bilateral interstitial and airspace
opacities are noted. The left costophrenic angle is not imaged.
There is no significant right pleural effusion. There is no
pneumothorax. A vascular stent overlies the left axilla.
Degenerative changes are seen in the spine.
IMPRESSION: Moderate patchy bilateral interstitial and airspace opacities may
reflect pulmonary edema and/or pneumonia.

Aortic Atherosclerosis ([id number]).

## 2024-02-07 ENCOUNTER — Ambulatory Visit (HOSPITAL_COMMUNITY)
Admission: RE | Admit: 2024-02-07 | Discharge: 2024-02-07 | Disposition: A | Discharge status: Home / Self Care | Attending: Surgery | Admitting: Surgery

## 2024-02-07 ENCOUNTER — Encounter (HOSPITAL_COMMUNITY): Payer: Self-pay | Admitting: *Deleted

## 2024-02-07 ENCOUNTER — Other Ambulatory Visit: Payer: Self-pay

## 2024-02-07 ENCOUNTER — Encounter (HOSPITAL_COMMUNITY)
Admission: RE | Disposition: A | Discharge status: Home / Self Care | Payer: Self-pay | Source: Home / Self Care | Attending: Surgery

## 2024-02-07 ENCOUNTER — Encounter (HOSPITAL_COMMUNITY): Payer: Self-pay | Admitting: Surgery

## 2024-02-07 DIAGNOSIS — N186 End stage renal disease: Secondary | ICD-10-CM | POA: Diagnosis present

## 2024-02-07 DIAGNOSIS — Z992 Dependence on renal dialysis: Secondary | ICD-10-CM | POA: Insufficient documentation

## 2024-02-07 DIAGNOSIS — Y832 Surgical operation with anastomosis, bypass or graft as the cause of abnormal reaction of the patient, or of later complication, without mention of misadventure at the time of the procedure: Secondary | ICD-10-CM | POA: Diagnosis not present

## 2024-02-07 DIAGNOSIS — I120 Hypertensive chronic kidney disease with stage 5 chronic kidney disease or end stage renal disease: Secondary | ICD-10-CM | POA: Insufficient documentation

## 2024-02-07 DIAGNOSIS — T82858A Stenosis of vascular prosthetic devices, implants and grafts, initial encounter: Secondary | ICD-10-CM | POA: Diagnosis not present

## 2024-02-07 HISTORY — PX: A/V SHUNT INTERVENTION: CATH118220

## 2024-02-07 HISTORY — PX: A/V SHUNTOGRAM: CATH118297

## 2024-02-07 MED ORDER — HEPARIN (PORCINE) IN NACL 1000-0.9 UT/500ML-% IV SOLN
INTRAVENOUS | Status: DC | PRN
  Administered 2024-02-07: 500 mL via SURGICAL_CAVITY

## 2024-02-07 MED ORDER — LIDOCAINE HCL (PF) 1 % IJ SOLN
INTRAMUSCULAR | Status: DC | PRN
  Administered 2024-02-07: 5 mL

## 2024-02-07 MED ORDER — LIDOCAINE HCL (PF) 1 % IJ SOLN
INTRAMUSCULAR | Status: AC
  Filled 2024-02-07: qty 30

## 2024-02-07 MED ORDER — IODIXANOL 320 MG/ML IV SOLN
INTRAVENOUS | Status: DC | PRN
  Administered 2024-02-07: 60 mL via INTRAVENOUS

## 2024-02-07 NOTE — H&P (Signed)
 Vascular and Vein Specialist of [facility]  Patient name: Alexa Dougherty MRN: [id number] DOB: 09/30/1945 Sex: female   REASON FOR VISIT:    Dialysis access issues  HISOTRY OF PRESENT ILLNESS:    Alexa Dougherty is a 78 y.o. female who has undergone the following procedures: 02/02/2012: Left brachiocephalic fistula 88/88/7986: Fistulogram with venoplasty 05/18/2022: Revision of left arm fistula with aneurysm plication  She was last seen in our office in 2024.  She is having trouble with dialysis flow rates.  She comes in today for further evaluation.  She has not had any intervention since she had fistula revision.  She is having trouble with decreased flow rates.  Her last dialysis was yesterday.   PAST MEDICAL HISTORY:   Past Medical History:  Diagnosis Date   Anemia    Arthritis    Right shoulder   Blood transfusion without reported diagnosis    Yrs ago when she had anemia   Cataract    bilateral repair   Colon polyps    ESRD on hemodialysis (HCC)    Gastritis and gastroduodenitis 12/29/2014   GERD (gastroesophageal reflux disease)    Headache(784.0)    Hx of adenomatous colonic polyps 01/04/2015   Hyperlipemia    Hypertension      FAMILY HISTORY:   Family History  Problem Relation Age of Onset   Hypertension Mother    Cancer Sister        type unknown   Cancer Brother        type unknown   Hypertension Daughter    Colon cancer Neg Hx    Colon polyps Neg Hx    Esophageal cancer Neg Hx    Stomach cancer Neg Hx    Rectal cancer Neg Hx     SOCIAL HISTORY:   Social History   Tobacco Use   Smoking status: Never   Smokeless tobacco: Never  Substance Use Topics   Alcohol use: No     ALLERGIES:   No Known Allergies   CURRENT MEDICATIONS:   No current facility-administered medications for this encounter.    REVIEW OF SYSTEMS:   [X]  denotes positive finding, [ ]  denotes negative finding Cardiac   Comments:  Chest pain or chest pressure:    Shortness of breath upon exertion:    Short of breath when lying flat:    Irregular heart rhythm:        Vascular    Pain in calf, thigh, or hip brought on by ambulation:    Pain in feet at night that wakes you up from your sleep:     Blood clot in your veins:    Leg swelling:         Pulmonary    Oxygen at home:    Productive cough:     Wheezing:         Neurologic    Sudden weakness in arms or legs:     Sudden numbness in arms or legs:     Sudden onset of difficulty speaking or slurred speech:    Temporary loss of vision in one eye:     Problems with dizziness:         Gastrointestinal    Blood in stool:     Vomited blood:         Genitourinary    Burning when urinating:     Blood in urine:        Psychiatric    Major depression:  Hematologic    Bleeding problems:    Problems with blood clotting too easily:        Skin    Rashes or ulcers:        Constitutional    Fever or chills:      PHYSICAL EXAM:   Vitals:   02/07/24 0724  BP: (!) 159/73  Pulse: 68  Resp: 14  SpO2: 96%    GENERAL: The patient is a well-nourished female, in no acute distress. The vital signs are documented above. CARDIAC: There is a regular rate and rhythm.  VASCULAR: Palpable thrill within left brachiocephalic fistula PULMONARY: Non-labored respirations ABDOMEN: Soft and non-tender with normal pitched bowel sounds.  NEUROLOGIC: No focal weakness or paresthesias are detected. SKIN: There are no ulcers or rashes noted. PSYCHIATRIC: The patient has a normal affect.   MEDICAL ISSUES:   ESRD: I spoke to the patient using her daughter as the interpreter as our interpretive services had not arrived yet.  She has undergone fistulogram's in the past.  She did not have any questions regarding the procedure.  I discussed proceeding with a fistulogram and intervention as indicated.  All questions were answered.    Malvina Serene CLORE,  MD, FACS [facility]ists of [facility] [phone] Pager [phone]

## 2024-02-07 NOTE — Op Note (Signed)
    Patient name: Alexa Dougherty MRN: [id number] DOB: 1945/10/29 Sex: female  02/07/2024 Pre-operative Diagnosis: ESRD Post-operative diagnosis:  Same Surgeon:  Malvina New Procedure Performed:  1.  Ultrasound-guided access, left brachiocephalic fistula  2.  Fistulogram  3.  Central venoplasty (left innominate vein)  4.  Peripheral venoplasty (left cephalic vein)   Indications: This is a 78 year old female with decreased flow rates who comes in today for fistulogram  Procedure:  The patient was identified in the holding area and taken to room 8.  The patient was then placed supine on the table and prepped and draped in the usual sterile fashion.  A time out was called.  Ultrasound was used to evaluate the fistula.  The vein was patent and compressible.  A digital ultrasound image was acquired.  The fistula was then accessed under ultrasound guidance using a micropuncture needle.  An 018 wire was then asvanced without resistance and a micropuncture sheath was placed.  Contrast injections were then performed through the sheath.  Findings: The arteriovenous anastomosis is widely patent.  Ectasia throughout the entire fistula in the axilla and proximal cephalic vein stents are visualized with in-stent stenosis.  The central venous system is patent however at the SVC and left innominate vein junction there is a 70% stenosis   Intervention: After the above images were acquired the decision was made to proceed with intervention.  A 7 French sheath was inserted.  A Glidewire advantage was used to navigate into the SVC.  I first selected a 10 x 40 Mustang balloon to treat the central venous stenosis in the left innominate vein.  This was taken to nominal pressure.  Completion imaging revealed improved but suboptimal result.  I then used a 10 mm balloon to treat the in-stent stenosis within the proximal cephalic vein stents.  I then upsized to a millimeter balloon and repeated central venoplasty.   Completion imaging showed residual stenosis less than 20% in the central venous system.  The previously deployed stents were widely patent.  The access site was closed with a Monocryl  Impression:  #1  Left innominate vein stenosis treated using a 12 mm balloon  #2  In-stent stenosis within the cephalic vein successfully dilated with 10 mm balloon  #3  Access remains amenable to percutaneous intervention    V. Malvina New, M.D., [facility] of [place] Office: [phone] Pager:  [phone]

## 2024-02-14 ENCOUNTER — Encounter (HOSPITAL_COMMUNITY): Payer: Self-pay | Admitting: *Deleted

## 2024-06-05 ENCOUNTER — Encounter (HOSPITAL_COMMUNITY): Payer: Self-pay | Admitting: Vascular Surgery

## 2024-06-05 ENCOUNTER — Encounter (HOSPITAL_COMMUNITY): Admission: RE | Disposition: A | Payer: Self-pay | Source: Home / Self Care | Attending: Vascular Surgery

## 2024-06-05 ENCOUNTER — Other Ambulatory Visit: Payer: Self-pay

## 2024-06-05 ENCOUNTER — Ambulatory Visit (HOSPITAL_COMMUNITY)
Admission: RE | Admit: 2024-06-05 | Discharge: 2024-06-05 | Disposition: A | Discharge status: Home / Self Care | Attending: Vascular Surgery | Admitting: Vascular Surgery

## 2024-06-05 DIAGNOSIS — T82838A Hemorrhage of vascular prosthetic devices, implants and grafts, initial encounter: Secondary | ICD-10-CM | POA: Diagnosis present

## 2024-06-05 DIAGNOSIS — Z992 Dependence on renal dialysis: Secondary | ICD-10-CM | POA: Diagnosis not present

## 2024-06-05 DIAGNOSIS — N186 End stage renal disease: Secondary | ICD-10-CM | POA: Diagnosis not present

## 2024-06-05 DIAGNOSIS — T82858A Stenosis of vascular prosthetic devices, implants and grafts, initial encounter: Secondary | ICD-10-CM | POA: Insufficient documentation

## 2024-06-05 DIAGNOSIS — I120 Hypertensive chronic kidney disease with stage 5 chronic kidney disease or end stage renal disease: Secondary | ICD-10-CM | POA: Insufficient documentation

## 2024-06-05 DIAGNOSIS — Y832 Surgical operation with anastomosis, bypass or graft as the cause of abnormal reaction of the patient, or of later complication, without mention of misadventure at the time of the procedure: Secondary | ICD-10-CM | POA: Diagnosis not present

## 2024-06-05 DIAGNOSIS — I871 Compression of vein: Secondary | ICD-10-CM | POA: Diagnosis not present

## 2024-06-05 HISTORY — PX: VENOUS ANGIOPLASTY: CATH118376

## 2024-06-05 HISTORY — PX: A/V FISTULAGRAM: CATH118298

## 2024-06-05 MED ORDER — LIDOCAINE HCL (PF) 1 % IJ SOLN
INTRAMUSCULAR | Status: AC
  Filled 2024-06-05: qty 30

## 2024-06-05 MED ORDER — HEPARIN (PORCINE) IN NACL 1000-0.9 UT/500ML-% IV SOLN
INTRAVENOUS | Status: DC | PRN
  Administered 2024-06-05: 500 mL

## 2024-06-05 MED ORDER — IODIXANOL 320 MG/ML IV SOLN
INTRAVENOUS | Status: DC | PRN
  Administered 2024-06-05: 28 mL via INTRAVENOUS

## 2024-06-05 MED ORDER — LIDOCAINE HCL (PF) 1 % IJ SOLN
INTRAMUSCULAR | Status: DC | PRN
  Administered 2024-06-05: 5 mL

## 2024-06-05 NOTE — Op Note (Signed)
" ° ° °  Patient name: Alexa Dougherty MRN: [id number] DOB: [birth year] Sex: female  06/05/2024 Pre-operative Diagnosis: End-stage renal disease, malfunction left arm AV fistula with prolonged bleeding after dialysis Post-operative diagnosis:  Same Surgeon:  Penne BROCKS. Sheree, MD Procedure Performed: 1.  Percutaneous ultrasound-guided cannulation left arm AV fistula 2.  Left upper extremity fistulogram 3.  Plain balloon angioplasty with 10 mm Mustang left innominate vein and cephalic arch stenosis  Indications: 79 year old female with history of end-stage renal disease currently dialyzing via left upper arm cephalic vein fistula.  She has prolonged bleeding after dialysis with previous history of intervention of her left innominate vein and in-stent restenosis of the cephalic arch stent.  We have discussed her options and she has elected for fistulogram with possible intervention.  Findings: The fistula in the peripheral arm is tortuous however patent and there is strong inflow.  The cephalic arch has a stent which has approximately 30% stenosis this was not intervened upon.  At the most central cephalic arch there is 85% stenosis balloon to less than 20% residual stenosis.  The innominate vein has 90% stenosis and this was ballooned to less than 20% residual stenosis.  Initially from the innominate vein there was significant collateralization and this is improved at completion with a much improved thrill in the fistula.   Procedure:  The patient was identified in the holding area and taken to heart and vascular procedure room.  The patient was then placed supine on the table and prepped and draped in the usual sterile fashion.  A time out was called.  Ultrasound was used to evaluate the left common upper arm AV fistula and this was cannulated with a micropuncture needle followed by wire and a sheath.  An ultrasound images saved to the permanent record.  Left upper extremity fistulogram was performed.  With  the above findings we placed a Glidewire advantage into the IVC under fluoroscopic guidance.  We then placed a 6 French sheath.  We performed primary balloon angioplasty with 10 mm balloon of the innominate vein and cephalic arch stenosis.  There was much improved flow by palpation as well as much improved collateralization of the central veins.  With this we then compressed the fistula perform retrograde fistulogram of the inflow which was very strong at the the anastomosis was not fully visualized.  We elected for no further intervention.  The sheath was removed and the cannulation site was suture-ligated.  She tolerated the procedure without Ameeth complication.   Contrast: 28cc  Chancy Smigiel C. Sheree, MD [facility] of [place] Office: [phone] Pager: [phone]   "

## 2024-06-05 NOTE — H&P (Signed)
 " H&P   History of Present Illness: This is a 79 y.o. female history of end-stage renal disease on dialysis via left arm cephalic vein fistula which has undergone previous stenting and subsequent repeat intervention.  Fistula itself is greater than 79 years old.  She denies prolonged bleeding after dialysis with oozing at home with known central venous stenosis.  She is now here to discuss possible fistulogram with intervention.  Past Medical History:  Diagnosis Date   Anemia    Arthritis    Right shoulder   Blood transfusion without reported diagnosis    Yrs ago when she had anemia   Cataract    bilateral repair   Colon polyps    ESRD on hemodialysis (HCC)    Gastritis and gastroduodenitis 12/29/2014   GERD (gastroesophageal reflux disease)    Headache(784.0)    Hx of adenomatous colonic polyps 01/04/2015   Hyperlipemia    Hypertension     Past Surgical History:  Procedure Laterality Date   A/V SHUNT INTERVENTION Left 02/07/2024   Procedure: A/V SHUNT INTERVENTION;  Surgeon: Serene Gaile ORN, MD;  Location: [facility];  Service: Cardiovascular;  Laterality: Left;   A/V SHUNTOGRAM  02/07/2024   Procedure: A/V SHUNTOGRAM;  Surgeon: Serene Gaile ORN, MD;  Location: [facility];  Service: Cardiovascular;;   AV FISTULA PLACEMENT  02/02/2012   Procedure: ARTERIOVENOUS (AV) FISTULA CREATION;  Surgeon: Lonni GORMAN Blade, MD;  Location: [facility] OR;  Service: Vascular;  Laterality: Left;   AV FISTULA REPAIR     Angioplasty of fistula multiple times Left   COLONOSCOPY  2016   COLONOSCOPY WITH PROPOFOL  N/A 12/29/2014   Procedure: COLONOSCOPY WITH PROPOFOL ;  Surgeon: Lupita FORBES Commander, MD;  Location: [facility];  Service: Endoscopy;  Laterality: N/A;   ESOPHAGOGASTRODUODENOSCOPY (EGD) WITH PROPOFOL  N/A 12/29/2014   Procedure: ESOPHAGOGASTRODUODENOSCOPY (EGD) WITH PROPOFOL ;  Surgeon: Lupita FORBES Commander, MD;  Location: [facility];  Service: Endoscopy;  Laterality: N/A;   FISTULOGRAM N/A 04/01/2012    Procedure: FISTULOGRAM;  Surgeon: Lonni GORMAN Blade, MD;  Location: [facility] CATH LAB;  Service: Cardiovascular;  Laterality: N/A;   INSERTION OF DIALYSIS CATHETER  02/02/2012   Procedure: INSERTION OF DIALYSIS CATHETER;  Surgeon: Lonni GORMAN Blade, MD;  Location: [facility] OR;  Service: Vascular;  Laterality: Right;  Insertion of 23cm dialysis catheter in Right Internal Jugular    INSERTION OF DIALYSIS CATHETER Left 05/18/2022   Procedure: INSERTION OF LEFT FEMORAL VEIN TUNNELED DIALYSIS CATHETER;  Surgeon: Magda Debby SAILOR, MD;  Location: [facility] OR;  Service: Vascular;  Laterality: Left;   IR ANGIOGRAM SELECTIVE EACH ADDITIONAL VESSEL  09/06/2020   IR ANGIOGRAM SELECTIVE EACH ADDITIONAL VESSEL  09/06/2020   IR ANGIOGRAM SELECTIVE EACH ADDITIONAL VESSEL  09/06/2020   IR ANGIOGRAM SELECTIVE EACH ADDITIONAL VESSEL  08/01/2022   IR ANGIOGRAM SELECTIVE EACH ADDITIONAL VESSEL  08/01/2022   IR ANGIOGRAM SELECTIVE EACH ADDITIONAL VESSEL  08/01/2022   IR ANGIOGRAM VISCERAL SELECTIVE  09/06/2020   IR ANGIOGRAM VISCERAL SELECTIVE  08/01/2022   IR EMBO ART  VEN HEMORR LYMPH EXTRAV  INC GUIDE ROADMAPPING  09/06/2020   IR EMBO ART  VEN HEMORR LYMPH EXTRAV  INC GUIDE ROADMAPPING  08/01/2022   IR US  GUIDE VASC ACCESS RIGHT  09/06/2020   IR US  GUIDE VASC ACCESS RIGHT  08/01/2022   REVISON OF ARTERIOVENOUS FISTULA Left 05/18/2022   Procedure: REVISON OF LEFT ARTERIOVENOUS FISTULA;  Surgeon: Magda Debby SAILOR, MD;  Location: [facility] OR;  Service: Vascular;  Laterality: Left;  TUBAL LIGATION  1980    Allergies[1]  Prior to Admission medications  Medication Sig Start Date End Date Taking? Authorizing Provider  acetaminophen  (TYLENOL ) 500 MG tablet Take 500 mg by mouth every 6 (six) hours as needed for moderate pain or headache.    [provider]  amLODipine  (NORVASC ) 10 MG tablet Take 10 mg by mouth every evening. 10/14/14   [provider]  atorvastatin  (LIPITOR) 10 MG tablet Take 10 mg by mouth every evening.      [provider]  cinacalcet  (SENSIPAR ) 60 MG tablet Take 60 mg by mouth at bedtime.    [provider]  dexlansoprazole (DEXILANT) 60 MG capsule Take 60 mg by mouth daily.     [provider]  hydrALAZINE  (APRESOLINE ) 25 MG tablet Take 25 mg by mouth 3 (three) times daily.    [provider]  lidocaine -prilocaine  (EMLA ) cream Apply 1 application topically See admin instructions. Apply a small amount to access site (AVF) 1 to 2 hours before dialysis. Cover with occlusive dressing (saran warp). 08/27/18   [provider]  metoprolol  tartrate (LOPRESSOR ) 50 MG tablet Take 1 tablet (50 mg total) by mouth 2 (two) times daily. 09/13/18   Meccariello, Con PARAS, MD  multivitamin (RENA-VIT) TABS tablet Take 1 tablet by mouth daily.    [provider]  sevelamer  carbonate (RENVELA ) 800 MG tablet Take 3 tablets (2,400 mg total) by mouth 3 (three) times daily with meals. 09/13/18   Meccariello, Con PARAS, MD    Social History   Socioeconomic History   Marital status: Widowed    Spouse name: Not on file   Number of children: 7   Years of education: Not on file   Highest education level: Not on file  Occupational History   Occupation: retired  Tobacco Use   Smoking status: Never   Smokeless tobacco: Never  Vaping Use   Vaping status: Never Used  Substance and Sexual Activity   Alcohol use: No   Drug use: No   Sexual activity: Not on file  Other Topics Concern   Not on file  Social History Narrative   Not on file   Social Drivers of Health   Tobacco Use: Low Risk (02/07/2024)   Patient History    Smoking Tobacco Use: Never    Smokeless Tobacco Use: Never    Passive Exposure: Not on file  Financial Resource Strain: Not on file  Food Insecurity: Not on file  Transportation Needs: Not on file  Physical Activity: Not on file  Stress: Not on file  Social Connections: Not on file  Intimate Partner Violence: Not on file  Depression (EYV7-0):  Not on file  Alcohol Screen: Not on file  Housing: Not on file  Utilities: Not on file  Health Literacy: Not on file     Family History  Problem Relation Age of Onset   Hypertension Mother    Cancer Sister        type unknown   Cancer Brother        type unknown   Hypertension Daughter    Colon cancer Neg Hx    Colon polyps Neg Hx    Esophageal cancer Neg Hx    Stomach cancer Neg Hx    Rectal cancer Neg Hx     ROS: Prolonged oozing left arm AV fistula after dialysis   Physical Examination  Vitals:   06/05/24 0714  BP: (!) 149/69  Pulse: 74  Resp: 14  SpO2: 96%  There is no height or weight on file to calculate BMI.  Awake alert and oriented Nonlabored respirations Left upper arm fistula with thrill and mild pulsatility  CBC    Component Value Date/Time   WBC 7.6 08/04/2022 0819   RBC 3.07 (L) 08/04/2022 0819   HGB 8.8 (L) 08/04/2022 0819   HCT 26.4 (L) 08/04/2022 0819   HCT 27.2 (L) 09/02/2016 0347   PLT 162 08/04/2022 0819   MCV 86.0 08/04/2022 0819   MCH 28.7 08/04/2022 0819   MCHC 33.3 08/04/2022 0819   RDW 14.9 08/04/2022 0819   LYMPHSABS 1.3 08/04/2022 0819   MONOABS 0.7 08/04/2022 0819   EOSABS 0.2 08/04/2022 0819   BASOSABS 0.1 08/04/2022 0819    BMET    Component Value Date/Time   NA 133 (L) 08/04/2022 0819   K 4.5 08/04/2022 0819   CL 92 (L) 08/04/2022 0819   CO2 25 08/04/2022 0819   GLUCOSE 113 (H) 08/04/2022 0819   BUN 47 (H) 08/04/2022 0819   CREATININE 9.24 (H) 08/04/2022 0819   CALCIUM  8.6 (L) 08/04/2022 0819   CALCIUM  8.3 (L) 12/14/2011 1634   GFRNONAA 4 (L) 08/04/2022 0819   GFRAA 4 (L) 09/13/2018 0435    COAGS: Lab Results  Component Value Date   INR 1.1 08/02/2022   INR 1.0 08/01/2022   INR 1.0 09/06/2020      Vascular Imaging:   Previous fistulogram reviewed   ASSESSMENT/PLAN: This is a 79 y.o. female with end-stage renal disease with malfunctioning left arm AV fistula with increased bleeding time after  dialysis.  We have discussed her options and she has elected to proceed with fistulogram with possible intervention.  All questions were answered and consent was signed.  Thurston Brendlinger C. Sheree, MD [facility] of [place] Office: [phone] Pager: [phone]     [1] No Known Allergies  "

## 2024-06-10 ENCOUNTER — Encounter (HOSPITAL_COMMUNITY): Payer: Self-pay | Admitting: Vascular Surgery
